# Patient Record
Sex: Male | Born: 1935 | Race: Black or African American | Hispanic: No | Smoking: Never smoker
Health system: Southern US, Community
[De-identification: ages and names within clinical notes are randomized; demographics above are authoritative.]

## PROBLEM LIST (undated history)

## (undated) DIAGNOSIS — I639 Cerebral infarction, unspecified: Secondary | ICD-10-CM

## (undated) DIAGNOSIS — N200 Calculus of kidney: Secondary | ICD-10-CM

## (undated) DIAGNOSIS — M8580 Other specified disorders of bone density and structure, unspecified site: Secondary | ICD-10-CM

## (undated) DIAGNOSIS — I340 Nonrheumatic mitral (valve) insufficiency: Secondary | ICD-10-CM

## (undated) DIAGNOSIS — I519 Heart disease, unspecified: Secondary | ICD-10-CM

## (undated) DIAGNOSIS — H9190 Unspecified hearing loss, unspecified ear: Secondary | ICD-10-CM

## (undated) DIAGNOSIS — G4733 Obstructive sleep apnea (adult) (pediatric): Secondary | ICD-10-CM

## (undated) DIAGNOSIS — M4850XA Collapsed vertebra, not elsewhere classified, site unspecified, initial encounter for fracture: Secondary | ICD-10-CM

## (undated) DIAGNOSIS — K4090 Unilateral inguinal hernia, without obstruction or gangrene, not specified as recurrent: Secondary | ICD-10-CM

## (undated) DIAGNOSIS — N183 Chronic kidney disease, stage 3 unspecified: Secondary | ICD-10-CM

## (undated) DIAGNOSIS — I10 Essential (primary) hypertension: Secondary | ICD-10-CM

## (undated) DIAGNOSIS — E785 Hyperlipidemia, unspecified: Secondary | ICD-10-CM

## (undated) DIAGNOSIS — F329 Major depressive disorder, single episode, unspecified: Secondary | ICD-10-CM

## (undated) DIAGNOSIS — M109 Gout, unspecified: Secondary | ICD-10-CM

## (undated) DIAGNOSIS — I4821 Permanent atrial fibrillation: Secondary | ICD-10-CM

## (undated) DIAGNOSIS — I779 Disorder of arteries and arterioles, unspecified: Secondary | ICD-10-CM

## (undated) DIAGNOSIS — E039 Hypothyroidism, unspecified: Secondary | ICD-10-CM

## (undated) DIAGNOSIS — G40909 Epilepsy, unspecified, not intractable, without status epilepticus: Secondary | ICD-10-CM

## (undated) DIAGNOSIS — I739 Peripheral vascular disease, unspecified: Secondary | ICD-10-CM

## (undated) DIAGNOSIS — I442 Atrioventricular block, complete: Secondary | ICD-10-CM

## (undated) DIAGNOSIS — H409 Unspecified glaucoma: Secondary | ICD-10-CM

## (undated) DIAGNOSIS — K649 Unspecified hemorrhoids: Secondary | ICD-10-CM

## (undated) DIAGNOSIS — Z8719 Personal history of other diseases of the digestive system: Secondary | ICD-10-CM

## (undated) HISTORY — DX: Unspecified glaucoma: H40.9

## (undated) HISTORY — DX: Unspecified hemorrhoids: K64.9

## (undated) HISTORY — DX: Other specified disorders of bone density and structure, unspecified site: M85.80

## (undated) HISTORY — PX: INGUINAL HERNIA REPAIR: SUR1180

## (undated) HISTORY — DX: Obstructive sleep apnea (adult) (pediatric): G47.33

## (undated) HISTORY — DX: Calculus of kidney: N20.0

## (undated) HISTORY — DX: Collapsed vertebra, not elsewhere classified, site unspecified, initial encounter for fracture: M48.50XA

## (undated) HISTORY — DX: Unspecified hearing loss, unspecified ear: H91.90

## (undated) HISTORY — DX: Permanent atrial fibrillation: I48.21

## (undated) HISTORY — PX: HEMORRHOID SURGERY: SHX153

## (undated) HISTORY — DX: Cerebral infarction, unspecified: I63.9

## (undated) HISTORY — DX: Chronic kidney disease, stage 3 unspecified: N18.30

## (undated) HISTORY — DX: Heart disease, unspecified: I51.9

## (undated) HISTORY — DX: Hypothyroidism, unspecified: E03.9

## (undated) HISTORY — DX: Hyperlipidemia, unspecified: E78.5

## (undated) HISTORY — DX: Epilepsy, unspecified, not intractable, without status epilepticus: G40.909

## (undated) HISTORY — DX: Major depressive disorder, single episode, unspecified: F32.9

## (undated) HISTORY — DX: Chronic kidney disease, stage 3 (moderate): N18.3

## (undated) HISTORY — DX: Nonrheumatic mitral (valve) insufficiency: I34.0

## (undated) HISTORY — DX: Unilateral inguinal hernia, without obstruction or gangrene, not specified as recurrent: K40.90

## (undated) HISTORY — DX: Essential (primary) hypertension: I10

## (undated) HISTORY — DX: Gout, unspecified: M10.9

## (undated) HISTORY — DX: Personal history of other diseases of the digestive system: Z87.19

## (undated) HISTORY — DX: Atrioventricular block, complete: I44.2

---

## 1997-08-16 ENCOUNTER — Ambulatory Visit (HOSPITAL_COMMUNITY): Admission: RE | Admit: 1997-08-16 | Discharge: 1997-08-16 | Payer: Self-pay | Admitting: *Deleted

## 1997-08-17 ENCOUNTER — Inpatient Hospital Stay (HOSPITAL_COMMUNITY): Admission: EM | Admit: 1997-08-17 | Discharge: 1997-08-18 | Payer: Self-pay | Admitting: Emergency Medicine

## 1997-10-21 ENCOUNTER — Emergency Department (HOSPITAL_COMMUNITY): Admission: EM | Admit: 1997-10-21 | Discharge: 1997-10-21 | Payer: Self-pay | Admitting: Emergency Medicine

## 1997-10-24 ENCOUNTER — Inpatient Hospital Stay (HOSPITAL_COMMUNITY): Admission: RE | Admit: 1997-10-24 | Discharge: 1997-10-30 | Payer: Self-pay | Admitting: *Deleted

## 1998-09-11 ENCOUNTER — Emergency Department (HOSPITAL_COMMUNITY): Admission: EM | Admit: 1998-09-11 | Discharge: 1998-09-11 | Payer: Self-pay | Admitting: Emergency Medicine

## 1998-12-15 ENCOUNTER — Encounter (INDEPENDENT_AMBULATORY_CARE_PROVIDER_SITE_OTHER): Payer: Self-pay | Admitting: *Deleted

## 1998-12-15 ENCOUNTER — Inpatient Hospital Stay (HOSPITAL_COMMUNITY): Admission: EM | Admit: 1998-12-15 | Discharge: 1998-12-18 | Payer: Self-pay | Admitting: Emergency Medicine

## 1998-12-17 ENCOUNTER — Encounter: Payer: Self-pay | Admitting: Internal Medicine

## 2000-02-04 HISTORY — PX: CARDIAC CATHETERIZATION: SHX172

## 2000-04-07 ENCOUNTER — Ambulatory Visit (HOSPITAL_COMMUNITY): Admission: RE | Admit: 2000-04-07 | Discharge: 2000-04-07 | Payer: Self-pay | Admitting: *Deleted

## 2000-04-28 ENCOUNTER — Ambulatory Visit (HOSPITAL_COMMUNITY): Admission: RE | Admit: 2000-04-28 | Discharge: 2000-04-28 | Payer: Self-pay | Admitting: Interventional Cardiology

## 2004-02-19 ENCOUNTER — Emergency Department (HOSPITAL_COMMUNITY): Admission: EM | Admit: 2004-02-19 | Discharge: 2004-02-19 | Payer: Self-pay | Admitting: Emergency Medicine

## 2004-07-25 ENCOUNTER — Emergency Department (HOSPITAL_COMMUNITY): Admission: EM | Admit: 2004-07-25 | Discharge: 2004-07-25 | Payer: Self-pay | Admitting: Emergency Medicine

## 2005-06-04 ENCOUNTER — Encounter: Payer: Self-pay | Admitting: *Deleted

## 2006-06-26 ENCOUNTER — Encounter: Admission: RE | Admit: 2006-06-26 | Discharge: 2006-06-26 | Payer: Self-pay | Admitting: Internal Medicine

## 2006-07-24 ENCOUNTER — Encounter: Admission: RE | Admit: 2006-07-24 | Discharge: 2006-07-24 | Payer: Self-pay | Admitting: Cardiology

## 2006-07-28 ENCOUNTER — Ambulatory Visit (HOSPITAL_COMMUNITY): Admission: RE | Admit: 2006-07-28 | Discharge: 2006-07-29 | Payer: Self-pay | Admitting: Cardiology

## 2006-07-28 HISTORY — PX: PACEMAKER INSERTION: SHX728

## 2006-08-15 ENCOUNTER — Inpatient Hospital Stay (HOSPITAL_COMMUNITY): Admission: EM | Admit: 2006-08-15 | Discharge: 2006-08-16 | Payer: Self-pay | Admitting: Emergency Medicine

## 2006-09-07 ENCOUNTER — Encounter: Admission: RE | Admit: 2006-09-07 | Discharge: 2006-09-07 | Payer: Self-pay | Admitting: Sports Medicine

## 2006-09-09 ENCOUNTER — Encounter: Admission: RE | Admit: 2006-09-09 | Discharge: 2006-09-09 | Payer: Self-pay | Admitting: Sports Medicine

## 2007-08-12 ENCOUNTER — Ambulatory Visit: Payer: Self-pay | Admitting: Oncology

## 2007-09-09 LAB — CBC WITH DIFFERENTIAL/PLATELET
HCT: 40.8 % (ref 38.7–49.9)
MCH: 29.5 pg (ref 28.0–33.4)
MCV: 87 fL (ref 81.6–98.0)
MONO#: 0.3 10*3/uL (ref 0.1–0.9)
MONO%: 7.2 % (ref 0.0–13.0)
Platelets: 169 10*3/uL (ref 145–400)
RBC: 4.69 10*6/uL (ref 4.20–5.71)
WBC: 4.2 10*3/uL (ref 4.0–10.0)
lymph#: 1.3 10*3/uL (ref 0.9–3.3)

## 2007-09-09 LAB — MORPHOLOGY: PLT EST: ADEQUATE

## 2007-09-09 LAB — CHCC SMEAR

## 2007-09-13 LAB — COMPREHENSIVE METABOLIC PANEL
CO2: 22 mEq/L (ref 19–32)
Calcium: 9.3 mg/dL (ref 8.4–10.5)
Chloride: 103 mEq/L (ref 96–112)
Creatinine, Ser: 1.5 mg/dL (ref 0.40–1.50)
Sodium: 135 mEq/L (ref 135–145)
Total Protein: 7.7 g/dL (ref 6.0–8.3)

## 2007-09-13 LAB — ANA: Anti Nuclear Antibody(ANA): NEGATIVE

## 2007-09-13 LAB — IMMUNOFIXATION ELECTROPHORESIS
IgA: 373 mg/dL (ref 68–378)
IgM, Serum: 34 mg/dL — ABNORMAL LOW (ref 60–263)

## 2007-09-13 LAB — VITAMIN B12: Vitamin B-12: 857 pg/mL (ref 211–911)

## 2007-09-13 LAB — LACTATE DEHYDROGENASE: LDH: 181 U/L (ref 94–250)

## 2008-04-11 ENCOUNTER — Encounter: Admission: RE | Admit: 2008-04-11 | Discharge: 2008-04-11 | Payer: Self-pay | Admitting: Psychiatry

## 2008-11-18 ENCOUNTER — Emergency Department (HOSPITAL_COMMUNITY): Admission: EM | Admit: 2008-11-18 | Discharge: 2008-11-18 | Payer: Self-pay | Admitting: Emergency Medicine

## 2009-04-21 ENCOUNTER — Emergency Department (HOSPITAL_COMMUNITY): Admission: EM | Admit: 2009-04-21 | Discharge: 2009-04-22 | Payer: Self-pay | Admitting: Emergency Medicine

## 2009-04-21 ENCOUNTER — Emergency Department (HOSPITAL_COMMUNITY): Admission: EM | Admit: 2009-04-21 | Discharge: 2009-04-21 | Payer: Self-pay | Admitting: Family Medicine

## 2010-04-26 LAB — DIFFERENTIAL
Eosinophils Absolute: 0.3 10*3/uL (ref 0.0–0.7)
Monocytes Absolute: 0.3 10*3/uL (ref 0.1–1.0)
Monocytes Relative: 8 % (ref 3–12)
Neutro Abs: 1.6 10*3/uL — ABNORMAL LOW (ref 1.7–7.7)
Neutrophils Relative %: 39 % — ABNORMAL LOW (ref 43–77)

## 2010-04-26 LAB — URINALYSIS, ROUTINE W REFLEX MICROSCOPIC
Bilirubin Urine: NEGATIVE
Glucose, UA: NEGATIVE mg/dL
Nitrite: NEGATIVE
Protein, ur: NEGATIVE mg/dL
Specific Gravity, Urine: 1.011 (ref 1.005–1.030)
Urobilinogen, UA: 1 mg/dL (ref 0.0–1.0)
pH: 6.5 (ref 5.0–8.0)

## 2010-04-26 LAB — POCT I-STAT, CHEM 8
BUN: 18 mg/dL (ref 6–23)
Creatinine, Ser: 1.8 mg/dL — ABNORMAL HIGH (ref 0.4–1.5)
Hemoglobin: 15 g/dL (ref 13.0–17.0)
Sodium: 138 mEq/L (ref 135–145)
TCO2: 25 mmol/L (ref 0–100)

## 2010-04-26 LAB — CBC
Hemoglobin: 13.6 g/dL (ref 13.0–17.0)
MCV: 88.8 fL (ref 78.0–100.0)
Platelets: 168 10*3/uL (ref 150–400)

## 2010-05-09 LAB — CBC
HCT: 42.1 % (ref 39.0–52.0)
MCHC: 33.5 g/dL (ref 30.0–36.0)
MCV: 88.6 fL (ref 78.0–100.0)
RBC: 4.76 MIL/uL (ref 4.22–5.81)

## 2010-05-09 LAB — DIFFERENTIAL
Basophils Absolute: 0 10*3/uL (ref 0.0–0.1)
Basophils Relative: 0 % (ref 0–1)
Eosinophils Absolute: 0 10*3/uL (ref 0.0–0.7)
Eosinophils Relative: 0 % (ref 0–5)
Lymphocytes Relative: 17 % (ref 12–46)
Monocytes Absolute: 0.2 10*3/uL (ref 0.1–1.0)
Monocytes Relative: 5 % (ref 3–12)
Neutro Abs: 4 10*3/uL (ref 1.7–7.7)

## 2010-05-09 LAB — COMPREHENSIVE METABOLIC PANEL
AST: 36 U/L (ref 0–37)
Alkaline Phosphatase: 55 U/L (ref 39–117)
Calcium: 9.4 mg/dL (ref 8.4–10.5)
Chloride: 100 mEq/L (ref 96–112)
Creatinine, Ser: 1.4 mg/dL (ref 0.4–1.5)
GFR calc Af Amer: 59 mL/min — ABNORMAL LOW (ref >60)
GFR calc non Af Amer: 49 mL/min — ABNORMAL LOW (ref >60)
Total Protein: 7.9 g/dL (ref 6.0–8.3)

## 2010-05-09 LAB — LACTIC ACID, PLASMA: Lactic Acid, Venous: 2.2 mmol/L (ref 0.5–2.2)

## 2010-05-09 LAB — LIPASE, BLOOD: Lipase: 30 U/L (ref 11–59)

## 2010-06-18 NOTE — Discharge Summary (Signed)
NAMEHORICE, CARRERO NO.:  [id number]   MEDICAL RECORD NO.:  [id number]          PATIENT TYPE:  INP   LOCATION:  4729                         FACILITY:  [facility]   PHYSICIAN:  Georgann Housekeeper, MD      DATE OF BIRTH:  [birth year]   DATE OF ADMISSION:  08/14/2006  DATE OF DISCHARGE:  08/16/2006                               DISCHARGE SUMMARY   DISCHARGE DIAGNOSES:  1. Seizure with history of seizure in the past.  2. Back pain over compression fracture T11.  3. Hypertension.  4. History of AFib with brady syndrome, had pacemaker implanted.  5. History of gout.   MEDICATIONS ON DISCHARGE:  1. Keppra 250 mg b.i.d.  2. Actonel 35 mg once a week.  3. Eye drops for glaucoma.  4. Aspirin 81 mg daily.  5. Hydrochlorothiazide 25 mg daily.  6. Diovan 320 mg daily.  7. Robaxin 500 mg q.8 p.r.n.  8. Vicodin 5/500 one to two q4-6 p.r.n. for pain.  9. Colchicine 0.6 b.i.d. p.r.n. for gout.   ACTIVITIES:  No driving   Follow up in one week.   RADIOLOGY:  CT of the head negative.   X-ray of the thoracic spine showed old fracture, no acute changes.   LABS:  White count of 7.3, hemoglobin of 12.6, creatinine 1.2, potassium  3.4.   HOSPITAL COURSE:  71. A 77 year old old male whose history of seizure disorder was      followed by the neurologist at [facility]; they were slowly tapering      down his Keppra.  He came off the Keppra last week after he had a      pacemaker put in which was thought to be causing the issues, but he      had two seizures, one at home, one at the ER going off the Keppra.      Head CT was negative.  Patient was started with Keppra IV 500 mg in      the ER and then switched back to twice a day and then 250 b.i.d.;      he was on 250 b.i.d. for baseline, was doing well.  2. As far as the history of lower back pain, he was seen in the office      a few days back, had an old T11 compression fracture, no acute      injuries.  He was doing Vicodin and  Robaxin.  Held off the CT scan      of the spine at this point and see how he does.  3. As far as his hypertension, it remains stable on Diovan and HCTZ.      Potassium was mildly low at 3.4.      Encouraging him to do the dietary changes.  4. As far as his history of recent gout episode, resolving.  Continue      colchicine p.r.n.   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  I will see him back in a week.  Georgann Housekeeper, MD  Electronically Signed     KH/MEDQ  D:  08/16/2006  T:  08/16/2006  Job:  [id number]

## 2010-06-18 NOTE — H&P (Signed)
Oscar Santiago, Oscar Santiago               ACCOUNT NO.:  [id number]   MEDICAL RECORD NO.:  [id number]          PATIENT TYPE:  INP   LOCATION:  1829                         FACILITY:  [facility]   PHYSICIAN:  Melissa L. Ladona Ridgel, MD  DATE OF BIRTH:  09-21-1925   DATE OF ADMISSION:  08/14/2006  DATE OF DISCHARGE:                              HISTORY & PHYSICAL   CHIEF COMPLAINT:  Recurrent seizure activity.   PRIMARY CARE PHYSICIAN:  Georgann Housekeeper, MD   HISTORY OF PRESENT ILLNESS:  The patient is a 77 year old, African-  American male with history of 2-1/2 years of seizure-like activity that  typically were characterized by periods of slurred speech, confusion,  tonic-colonic activity.  The patient was seen locally at [facility] and was worked up for this condition, but they  were unable to diagnose this disorder and therefore, he was referred to  [facility] for further care.  The patient saw two physicians at  [facility].  His current neurologist there is Dr. Monia Sabal.  I was told  this physician resided at [facility], however, the family did  mention Martin.  The patient was seen and evaluated by Dr. Doreatha Martin and it  was determined that he did not have adult-onset epilepsy which had been  his previous diagnosis.  She therefore recommended further workup as his  condition and his cardiologist discovered that he had a tachycardia-  bradycardia syndrome.  He underwent pacemaker placement on June 24, and  since he had not had any further seizure activity, it was determined  that his Keppra would be discontinued.  The Keppra was weaned from a  dose of 250 b.i.d. and his last dose was this last Thursday.  Please  note that the patient has had adverse behavioral reactions to elevated  Keppra levels and therefore 250 b.i.d. has been the amount selected for  his continued care.  Today, the patient was seen by his granddaughter to  be having a tonic-clonic seizure at home and  was brought to the  emergency room for further care and a witnessed seizure occurred in the  emergency room.  He was loaded with Keppra and the emergency room  physician spoke to the neurologist on-call for [facility] and it  was recommended that he would be resumed on Keppra 250 b.i.d.   REVIEW OF SYSTEMS:  Difficult to obtain as the patient is postictal and  at this time, he complains of no pain and is very groggy.   PAST MEDICAL HISTORY:  1. Tachycardia-bradycardia syndrome.  2. Macular degeneration.  3. Seizure disorder of unknown origin.  4. Gout.  5. Recent onset of back pain with possible back injury, although no      family member saw him fall.   PAST SURGICAL HISTORY:  1. Pacemaker.  2. Prostate surgery.   SOCIAL HISTORY:  He is a Education officer, environmental of a church.  He smoked a pipe years  ago, but no longer does that and he does not drink.  Health care power  of attorney is his granddaughter.  We talked about his code status.  He  has stated that he does not wish to be on a ventilator, but would wish  CPR to be initiated and this should be stopped if he is not responding  favorably.  Please note that in the past, the patient has used excessive  BC powders, but that behavior has stopped.   FAMILY HISTORY:  Mother had an enlarged heart.  Dad had an unknown  illness, according to his brother.   MEDICATIONS:  1. Colchicine 0.6 mg p.o. daily.  2. Hydrocodone p.r.n.  3. Methocarbamol 500 mg, frequency was not provided to me, but this is      a new drug in response to his new back pain.  4. Diovan 300 mg p.o. nightly.  5. Hydrochlorothiazide 25 mg q.a.m.  6. Actonel each week.  7. Aspirin 81 mg daily.  Please note that the patient has refused to      take Coumadin despite his atrial fibrillation.  8. Cosopt eye drops bilaterally one drop.  He had been on Alphagan,      but his granddaughter told me that the eye doctor said there was a      contraindication, so therefore this was  discontinued in favor of      Xalatan one drop to both eyes.  9. Keppra was just finished on Thursday and resumed in the emergency      room.   PHYSICAL EXAMINATION:  VITAL SIGNS:  Temperature 97, blood pressure  155/73, pulse 60, respirations 16, saturations 100%.  GENERAL:  The patient is somnolent, but will awaken with strong vocal  and tactile stimuli.  HEENT:  Normocephalic, atraumatic.  Pupils equal round and reactive to  light, extraocular movements intact.  Mucous membranes are moist.  NECK:  Supple.  There is no JVD, no lymph nodes and no carotid bruits.  CHEST:  Decreased with no rhonchi, rales or wheezes.  CARDIAC:  Irregular with positive S1, S2.  No S3, S4.  I did not  appreciate a murmur, rub or gallop.  EXTREMITIES:  Tenderness in the left first digit of the left foot,  otherwise no other areas of gout are noted.  BACK:  An area that is slightly elevated and raised around the mid to  lower T-spine and is tender to palpation.  NEUROLOGIC:  He is intermittently awake.  Power seems to be symmetrical  and intact.  Plantars are downgoing.   LABORATORY DATA AND X-RAY FINDINGS:  Sodium 138, potassium 3.7, chloride  103, CO2 24, BUN 23, creatinine 1.4 with a glucose of 108.  White count  7.8, hemoglobin 12.6, hematocrit 37.9 with platelets of 212.  Cardiac  markers are negative x1.   ASSESSMENT:  This is a 77 year old, African-American male with extensive  past medical history for seizure disorder of unknown cause.  The patient  recently was diagnosed with tachycardia-bradycardia syndrome and a  pacemaker was placed.  It was thought that the pacemaker may be solving  the underlying problem of tachycardia-bradycardia which may have been  contributing to his cognitive changes and what appeared to be seizure  activities.  He had been doing well on Keppra for over a year and it was  decided that this new diagnosis had come about that they would try to  wean the Keppra.  His last  dose of Keppra was Thursday prior to this  admission.  He was noted today to have a seizure, tonic-clonic in  origin, without loss of bowel or bladder function and then a  witnessed  seizure in the emergency room.  The patient was loaded with Keppra and  treated for pain with Percocet.   PLAN:  1. Neurologic.  Seizure-like activity secondary to the discontinuation      of Keppra, but cannot rule out trauma.  There was no witnessed      fall, but he does have new unexplained back pain.  The patient's      neurologist was contacted by the emergency room physician and      recommendations to continue his Keppra at 250 mg q.12 h. after      loading him with 500 mg was made.  I will recommend a CT scan of      the head to rule out possible intracranial process causing      seizures.  I will place him on seizure precautions and continue his      Keppra as stated.  A neurology consult with [facility] can      be made if necessary by the primary team.  2. Cardiovascular with tachycardia-bradycardia syndrome.  Telemetry      monitoring will be continued.  I will continue his aspirin,      hydrochlorothiazide and Diovan.  3. Pulmonary.  No complaints at this time.  If the patient spikes a      temperature, I will recommend checking a chest x-ray to rule out      aspiration since he has had two seizures in the last 24 hours.  4. Gastrointestinal.  He is n.p.o. right now.  Until he is fully awake      and alert, I would keep him n.p.o.  His granddaughter described      some constipation, so I will add Colace, Senna and p.r.n. MiraLax.  5. Genitourinary.  The patient has not voided for most of the night.      Will therefore leave a p.r.n. Foley catheter order.  If he is      unable to void in the next hour or so, we will go ahead and place      the Foley.  6. Back pain with unclear trauma.  The patient does not recall having      trauma.  He states he was sitting in a chair when he became       startled by the doorbell ringing and noticed that his back hurt.  I      am going to go ahead and check a thoracic x-ray.  He may need to      have a CT scan of his back if there appears to be a fracture.  The      patient cannot have MRI of the spine because of his pacemaker.  I      will add Vicodin one p.o. q.4 h. p.r.n. pain.  7. Gout.  Will continue his colchicine.  I did not check a uric acid,      but this could be added to his morning labs.  8. Limited code.  The patient has expressed to his family a wish not      to have CPR.  He does not have a wish to be placed on the      ventilator.  Therefore, he stated if his CPR is ineffective, he      would like them to stop it and let him go.  9. Glaucoma.  Dr. Donette Larry can check his medication list, but his  granddaughter believes that his Alphagan was stopped in lieu of      using the Cosopt and Xalatan together.   This will initiate the patient's hospital stay.  If you have any  questions, please do not hesitate to contact me.  For tonight, I will be  on [phone], thereafter, the patient's primary physician team at [facility]  should be contacted.      Melissa L. Ladona Ridgel, MD  Electronically Signed     MLT/MEDQ  D:  08/15/2006  T:  08/15/2006  Job:  [id number]

## 2010-06-18 NOTE — Op Note (Signed)
NAMESINJIN, AMERO NO.:  [id number]   MEDICAL RECORD NO.:  [id number]          PATIENT TYPE:  OIB   LOCATION:  2807                         FACILITY:  [facility]   PHYSICIAN:  Francisca December, M.D.  DATE OF BIRTH:  [birth year]   DATE OF PROCEDURE:  07/28/2006  DATE OF DISCHARGE:                               OPERATIVE REPORT   INDICATION FOR SURGERY:  Permanent pacemaker insertion.   PROCEDURES PERFORMED:  1. Insertion of single-chamber ventricular permanent transvenous      pacemaker.  2. Left subclavian venogram.   INDICATIONS:  [name] is a 77 year old man with a long history  of chronic atrial fibrillation.  He has developed a very slow  ventricular response to his chronic atrial fibrillation, secondary to  high-degree AV block.  There may in fact be periods of complete heart  block.  He was brought to the catheterization laboratory at this time  for insertion of a single-chamber permanent transvenous pacemaker.   PROCEDURE NOTE:  The patient was brought to cardiac catheterization  laboratory in a fasting state.  The left prepectoral region was prepped  and draped in the usual sterile fashion.  Local anesthesia was obtained  with infiltration of 1% lidocaine with epinephrine throughout the left  prepectoral region.  A left subclavian venogram was performed with a  peripheral injection of 20 mL of Omnipaque.  A Digital cineangiogram in  the AP projection was obtained and road mapped to guide future left  subclavian puncture.  The venogram did demonstrate the vein to be widely  patent, and coursing in a normal fashion over the anterior surface of  the first rib and the middle third of the clavicle.  There was no  evidence for persistence of the left superior vena cava.   A 6-7 cm incision was then made in the deltopectoral groove, and this  was carried down by sharp dissection and electrocautery to the  prepectoral fascia.  There a plane was lifted  and the pocket formed  inferiorly and medially.  The pocket was then packed with 1% kanamycin-  soaked gauze.  A single left subclavian puncture was performed with an  18-gauge thin-walled needle, through which was passed a 0.038-inch tight  J guidewire.  Over this guidewire a 7-French tearaway sheath and dilator  were advanced.  The dilator and wire were removed and a ventricular lead  was advanced to the level of the right atrium.  The sheath was torn  away.  Using standard technique and fluoroscopic landmarks, the lead was  manipulated onto the upper-to-mid portion of the interventricular  septum.  There excellent pacing parameters were obtained, as will be  noted below.  This was an active fixation lead and the screw was  advanced as appropriate.  It was tested for diaphragmatic pacing at 10  volts and none was found.  The lead was then sutured into place using 3  separate 0 silk ligatures.  The kanamycin-soaked gauze was then removed  from the pocket, and the pocket was copiously irrigated using 1%  kanamycin solution.  The ventricular lead  was then attached to the  pacing generator and tightened into place with a set screw; it was  tested for security.  The lead was then wound beneath the pacing  generator and the generator was placed in the pocket.  An 0 silk  anchoring suture was applied.  The pocket was then inspected for  bleeding and none was found.  The pocket was then closed using 2-0  Vicryl in a running fashion for the subcutaneous layer.  The skin was  approximated using 4-0 Vicryl in a running subcuticular fashion.  Steri-  Strips and a sterile dressing were applied.  The patient was transported  to the recovery area, in a ventricular pace mode at 60 beats per minute.   EQUIPMENT DATA:  1. Pacing Generator:  Medtronic Adapta, Model Number [id number], Serial      Number [id number] H. 2.  2.  Ventricular Lead:  Medtronic model      number [id number], serial number [id number].    PACING DATA:  The pacing threshold was 0.6 volts at 0.5 milliseconds  pulse width.  The impedance was 632 ohms, resulting in a current at  capture threshold of 1.0 mA.  The R-wave detected amplitude was 17.3 mV.      Francisca December, M.D.  Electronically Signed     JHE/MEDQ  D:  07/28/2006  T:  07/28/2006  Job:  [id number]   cc:   Georgann Housekeeper, MD

## 2010-06-18 NOTE — Discharge Summary (Signed)
NAMEISHAQ, MAFFEI NO.:  [id number]   MEDICAL RECORD NO.:  [id number]          PATIENT TYPE:  OIB   LOCATION:  3736                         FACILITY:  [facility]   PHYSICIAN:  Francisca December, M.D.  DATE OF BIRTH:  1925/10/04   DATE OF ADMISSION:  07/28/2006  DATE OF DISCHARGE:  07/29/2006                               DISCHARGE SUMMARY   DISCHARGE DIAGNOSES:  1. Tachybrady syndrome status post permanent pacemaker, single      chamber, Medtronic type July 28, 2006.  2. Chronic  atrial fibrillation.  3. Refuses Coumadin.  4. Hypertension.  5. Seizure disorder.  6. Osteopenia with compression fracture, degenerative joint disease.  7. Glaucoma.  8. Mild dyslipidemia.   Mr. Vantol is a 77 year old male patient with the above chronic medical  conditions.  He has had chronic episodes of lightheadedness, some  confusion and memory loss with possible seizure-like activity.  He was  eventually placed on Keppra for this reason.  He does have tachybrady  syndrome and; therefore, he was brought to the cardiac catheterization  lab on July 28, 2006.   He received a single-chamber pacemaker implantation, Medtronic type by  Dr. Corliss Marcus on July 28, 2006.  The following day, he was doing  well, chest x-ray showed no pneumothorax and he was ready for discharge  to home.  He was discharged to home in stable, but improved condition.  There was some redness laterally at the incision site although there was  no active infection noted or no drainage.   He is discharged to home on the same medications that he came in on  which include:  1. Hydrochlorothiazide 25 mg one tablet daily.  2. Diovan 320 mg one tablet daily.  3. Actonel 35 mg weekly.  4. Flonase nasal spray daily.  5. Keppra 250 mg twice a day.  6. Baby aspirin daily.  7. Pepcid daily as prior to admission.  8. He may resume his eye drops, Cosopt and Alphagan as prior to      admission.  9. I did give him a  prescription for Vicodin one to two tablets every      six hours as needed for pain, #20 with one refill.   He is discharged to home in stable and improved condition.      Guy Franco, P.A.      Francisca December, M.D.  Electronically Signed    LB/MEDQ  D:  07/29/2006  T:  07/29/2006  Job:  [id number]   cc:   Lyn Records, M.D.

## 2010-06-21 NOTE — Consult Note (Signed)
[facility]  Patient:    Oscar Santiago                       MRN: [id number] Proc. Date: 12/15/98 Adm. Date:  [id number] Attending:  Dutch Gray CC:         Demetria Pore. Coral Spikes, M.D.             Norva Pavlov, M.D.                          Consultation Report  HISTORY OF PRESENT ILLNESS:  [name] is a 77 year old male, patient of Norva Pavlov, M.D., who is being covered by Demetria Pore. Coral Spikes, M.D.  He reports that on December 13, 1998, he had an episode of syncope with profuse sweating and was taken to [facility].  Cardiac evaluation was done and was normal and was supposedly blood count was also normal.  There was no investigation of GI bleeding at that time.  He returned home and has had multiple episodes of hematochezia since then.  He describes the stool as variously red, filled with clots, and dark stool.  He denies any abdominal pain, nausea, vomiting, dysphagia, epigastric discomfort or weight loss.  He does say he had some lower abdominal cramping with the original episodes of bleeding but this is not present currently. He is on Coumadin for chronic atrial fibrillation and thinks that on Tuesday he may have taken a double dose by mistake.  Pro-Time in the emergency room is 21.7 for an INR of 2.7 and hemoglobin is 10.2, where his baseline checked recently was over 14. He had a flexible sigmoidoscopy in September of this year that was reportedly negative to 60 cm; he also questionably has a prior history of peptic ulcer disease but the evaluation for this is unclear.  He does not drink alcohol or use aspirin. He is status post a hemorrhoidectomy.  PAST MEDICAL HISTORY:  Pertinent for prostatic hypertrophy.  He is status post  TURP and for environmental allergies, for hypertension and for chronic atrial fibrillation.  In addition, he tells me that he possibly had an episode of endocarditis in the past.  CURRENT  MEDICATIONS: 1. Coumadin. 2. Hydrochlorothiazide 12.5 mg per day. 3. Claritin D. 4. Flonase. 5. Alphagan eye drops.  ALLERGIES:  None reported.  FAMILY HISTORY:  Negative for ulcers, gallstones, inflammatory bowel disease or  gastrointestinal neoplasia.  SOCIAL HISTORY:  He is a widower with six living children, nonsmoker, nondrinker. He is a Education officer, environmental.  REVIEW OF SYSTEMS:  GENERAL:  No weight loss or night sweats.  ENDOCRINE:  No history of diabetes or thyroid problems.  SKIN:  No rash or pruritus.  EYES: No icterus or change in vision.  ENT:  No aphthous ulcers or chronic sore throat.  RESPIRATORY:  No shortness of breath, cough or wheezes.  CARDIAC:  No chest pain. Positive history of chronic atrial fibrillation.  GI:  As above.  GU:  Positive for prostatic hypertrophy, status post TURP.  Remainder of review of systems is negative.  PHYSICAL EXAMINATION:  GENERAL:  He is a well-developed, well-nourished, moderately overweight adult male in no acute distress.  VITAL SIGNS:  Blood pressure 154/71 and an irregular pulse of 65.  SKIN:  Normal.  HEENT:  Eyes are anicteric.  Oropharynx is unremarkable.  NECK:  Supple without thyromegaly.  LYMPHATICS:  There is no cervical or inguinal adenopathy.  CHEST:  Clear.  Heart sounds are irregularly irregular.  ABDOMEN:  Obese and soft without mass, tenderness or organomegaly.  There is no  rebound or hernia.  Bowel sounds are normal.  RECTAL:  Exam is not performed, having previously been done.  EXTREMITIES:  Without edema.  Dorsalis pedis pulses are 2+ bilaterally.  LABORATORY DATA:  Laboratory tests reveal prothrombin time of 21.7, PTT is normal, electrolytes are normal; BUN is 20, SGOT is 39 but other liver function tests are normal.  Hemoglobin is 10.2 with an MCV of 81.4.  Platelet count is normal at 208,000.  IMPRESSION:  [age]-year-old male on Coumadin for chronic atrial fibrillation with  gastrointestinal bleeding.  This is likely lower; however, in light of his  need for Coumadin and prior history of peptic ulcer disease and minimal nausea nd sense of regurgitation when the bleeding started, I think we had best check his  upper tract as well before resuming Coumadin.  PLAN:  Both upper endoscopy and colonoscopy are reviewed with the patient and his family in terms of preparation technique and risks of complications including bleeding and perforation.  They agree to proceed and the endoscopic studies are  scheduled for 12:30 tomorrow afternoon.  Phospho-Soda prep and clear liquid diet will be administered until breakfast.  Vitamin K is given to correct his Pro-Time. Serial hemoglobins will be checked and he will be transfused if it drops below . Pepcid is being given IV.  Please see the orders. DD:  12/15/98 TD:  12/16/98 Job: [id number]

## 2010-06-21 NOTE — Procedures (Signed)
[place]. [facility]  Patient:    Oscar Santiago                       MRN: [id number] Proc. Date: 12/16/98 Adm. Date:  [id number] Attending:  Dutch Gray CC:         Norva Pavlov, M.D.             Demetria Pore. Coral Spikes, M.D.                           Procedure Report  PROCEDURES: 1. Video upper endoscopy. 2. Video colonoscopy.  INDICATIONS:  This is a 77 year old male with maroon stool and syncope.  PREPARATION:  He is NPO since midnight, having taken Phospho-Soda prep and a clear liquid diet yesterday.  In addition, he has been transfused two units of blood.  PREPROCEDURE SEDATION:  He received 40 mg of Demerol and 5 mg of Versed intravenously.  In addition he was on 2 liters of O2 by nasal cannula.  PROCEDURE #1: Video upper endoscopy.  DESCRIPTION OF PROCEDURE:  The Olympus video upper endoscope was inserted via the mouth and advanced easily through the upper esophageal sphincter.  Intubation was then carried out well into the third duodenum.  On withdrawal the mucosa was carefully evaluated.  The duodenum and bulb appeared normal other than a possible old ulcer scar in the duodenal bulb.  There was no active ulceration.  The antrum and body of the stomach were unremarkable.  Retroflexed view of the fundus and GE junction demonstrated mild laxity of the Z-line.  However, the Z-line occurred at 43 cm and there was no clear sign of a hiatal hernia.  There was no  esophagitis.  No blood was seen throughout the upper GI tract.  At the conclusion of this procedure the patients position was reversed for procedure #2.  PROCEDURE #2: Video colonoscopy.  DESCRIPTION OF PROCEDURE:  He required no additional sedation.  The Olympus video colonoscope was inserted via the rectum and advanced quite easily all the way to the cecum.  Cecal landmarks were identified and photographed.  The ileocecal valve was not traversed.  On withdrawal the mucosa  was carefully evaluated.  Note was  made of minimal scattered diverticulosis throughout the colon.  There was no active bleeding or neoplastic lesion of the right colon.  Within the rectum there was  diminutive polyp removed with hot biopsy forceps.  Retroflexed view of the rectum demonstrated grade 1-2 internal hemorrhoids. There were no other lesions of note.  There was no inflammation or bleeding found.  The patient tolerated both procedures well.  Pulse, blood pressure and oximetry  testing were stable throughout.  He was observed in recovery for 45 minutes and  discharged back to his hospital room.  IMPRESSION: 1. Essentially normal upper endoscopy. Possible scarred duodenum from old ulcer    disease, not currently active. 2. Near normal colonoscopy with minimal scattered diverticulosis and grade 1-2    internal hemorrhoids.  Since there has been no clear site of bleeding found I am suspicious that he has a vascular abnormality that either is not obvious at this time or is located in the small bowel.  A less likely consideration would be a small bowel mass lesion.  RECOMMENDATIONS:  I think the patient should undergo a small bowel series to make certain there is no neoplastic process in the small bowel.  I think  if this is negative and he has no further bleeding, Coumadin should be started again cautiously.  If he has recurrent gastrointestinal bleeding, consideration should be given to enteroscopy of the small bowel looking for vascular lesions and/or a bleeding scan to localize the site. DD:  12/16/98 TD:  12/17/98 Job: [id number]

## 2010-06-21 NOTE — Cardiovascular Report (Signed)
[place]. [facility]  Patient:    Oscar Santiago, Oscar Santiago                      MRN: [id number] Proc. Date: 04/28/00 Adm. Date:  [id number] Disc. Date: [id number] Attending:  Meade Maw A CC:         Pearla Dubonnet, M.D.  Norva Pavlov, M.D.  Darius Bump, M.D.   Cardiac Catheterization  INDICATION:  Mitral regurgitation with documented 3 to 4+ mitral regurgitation by TEE.  PROCEDURE: 1. Left heart catheterization. 2. Right heart catheterization. 3. Coronary angiography. 4. Left ventriculography.  DESCRIPTION:  After informed consent, a 6-French sheath was placed into the right femoral artery and an 8-French sheath in the right femoral vein both via the modified Seldinger technique.  We then performed right heart catheterization using a 7-French Swan-Ganz catheter.  Oximetry samples were obtained from the main PA and also from the ascending aorta.   Left heart hemodynamic recordings were made with pigtail catheter.  Oximetry samples were also obtained with this catheter.  Left ventriculography was performed in the 30 degree RAO and also in the 40 degree LAO with 20 degrees of cranial angulation.  Each injection was performed using 40 cc of contrast at 12 cc per second.  Coronary angiography was performed with a #4 6-French left Judkins catheter and a 6-French A2 multipurpose catheter, respectively.  The patient tolerated the procedure without complications.  RESULTS: I.   Hemodynamic data:      a. Right atrial mean pressure 9 mmHg.      b. RV pressure 40/9 mmHg.      c. Pulmonary artery pressure 41/20 mmHg.      d. Pulmonary capillary wedge pressure 17 mmHg.      e. Aortic pressure 148/68 mmHg.      f. Left ventricular pressure 1.9/15 mmHg.      g. Cardiac output by Fick 5.5 liters per minute and by thermodilution         4.3 liters per minute. II.  Left ventriculography:  The left ventricle is normal to mildly dilated      in size.   The ejection fraction is felt to be greater than 60%.  There is      1+ (at most 2+) mitral regurgitation.  No regional wall motion      abnormality is noted III. Selective coronary angiography.      a. Left main coronary:  Normal.      b. Left anterior descending coronary: This is a large vessel that wraps         around the left ventricular apex.  Proximal luminal irregularities         arise from it.  Two small diagonal branches arise from it.  No         significant obstruction is noted in the LAD.      c. Ramus intermedius branch: A large ramus branch arises from the left         main.  There is ostial 50% narrowing in this ramus branch.  The ramus         branch bifurcates on the left lateral wall.      d. Circumflex artery: The circumflex is large.  It gives origin to four         obtuse marginal branches.  Irregularities are noted in the mid vessel.         No significant obstruction is  seen.      e. Right coronary: The right coronary artery is relatively small, giving         an inferior right ventricular branch and a small PDA.  No significant         obstruction is noted.  CONCLUSIONS: 1. Mild mitral regurgitation (1+). 2. Overall normal left ventricular function with ejection fraction greater    than 60%. 3. Mild pulmonary hypertension with pulmonary artery systolic pressure of    40 mmHg. 4. Minimal luminal irregularities noted in the right coronary left anterior    descending artery and circumflex but no significant obstructive disease is    seen.  RECOMMENDATIONS:  Medical therapy.DD:  04/28/00 TD:  04/28/00 Job: [id number]

## 2010-06-21 NOTE — Cardiovascular Report (Signed)
[place]. [facility]  Patient:    Oscar Santiago, Oscar Santiago                      MRN: [id number] Proc. Date: 04/07/00 Adm. Date:  [id number] Attending:  Meade Maw A CC:         Celso Sickle, M.D.   Cardiac Catheterization  PROCEDURE:  Transesophageal echocardiogram.  CARDIOLOGIST:  Meade Maw, M.D.  INDICATION FOR STUDY:  Evaluation of mitral regurgitation.  Quality of the study is good.  ANESTHESIA:  Versed 10 mg IV, fentanyl 100 mcg IV.  DISPOSITION:  The patient tolerated the procedure well.  FINDINGS:  The anterior and posterior leaflet of the mitral valve were thickened.  There was poor coaptation of the mitral valve leaflets resulting in moderately severe mitral regurgitation.  The regurgitant jet was noted at the level of the pulmonary veins.  There was biatrial enlargement.  There was moderate tricuspid regurgitation with the peak right ventricular systolic pressure recorded at 46 mmHg.  The subvalvular mitral valve apparatus was normal.  There was no significant annular calcification noted.  The aortic valve was trileaflet, opened well in systole, no restriction in leaflet mobility was noted.  Tricuspid valve is grossly normal.  Moderate tricuspid regurgitation as noted above.  Pulmonic valve is grossly normal.  The left ventricle is mildly dilated.  Overall preserved ejection fraction approximately 55%.  Right ventricle normal dimension and function.  There is no pericardial effusion.  Normal ascending aorta.  The arch and descending aorta not well visualized secondary to tortuous aorta.  Negative bubble study. Normal left atrial appendage.  OVERALL IMPRESSION:   Thickened mitral valve leaflets with moderate to moderately severe mitral regurgitation, normal subvalvular apparatus. Pulmonary artery systolic pressure recorded at 46 mmHg.  Left ventricle mildly dilated with preserved systolic function.  The findings will be discussed  with Dr. Katrinka Blazing. DD:  04/07/00 TD:  04/07/00 Job: [id number]

## 2010-06-21 NOTE — H&P (Signed)
[place]. [facility]  Patient:    Oscar Santiago, Oscar Santiago                     MRN: [id number] Adm. Date:  04/28/00 Attending:  Celso Sickle, M.D. Dictator:   Anselm Lis, N.P.                         History and Physical  DATE OF BIRTH:  Jan 17, 1926  PROBLEMS:  (As dictated by Dr. Darci Needle III): 1. Valvular cardiac disease, moderate to severe MR, moderate TR.    Question prior bacterial endocarditis in 1990:    a.  On April 07, 2000, transesophageal echocardiogram by Dr. Meade Maw,        revealing moderate to moderately-severe MR, moderate TR.  Increased        PA systolic pressure of 46 mmHg with mildly dilated LV, preserved        LV function.  Biatrial enlargement with normal RV.  In February 2002,        a 2-D echocardiogram revealing moderate MR, dilated LV, with an        EF of 55%, mild LVH, left atrial enlargement of 6 cm. 2. Chronic atrial fibrillation, controlled ventricular response on    aspirin only, secondary to prior GI bleed. 3. History of GI bleed in November 2000, exacerbated by Coumadin therapy.    EGD and colonoscopy okay at that time. 4. Hypertension. 5. BPH, status post TURP in September 1999. 6. Seasonal allergies, in December 2001, bronchitis/sinusitis. 7. History of diverticulosis. 8. Glaucoma. 9. Osteoarthritis.  PLAN:  (As dictated by Dr. Verdis Prime):  Right and left cardiac catheterization, question recommendation for surgical repair of mitral valve, pending results.  The risks, potential complications, benefits, and alternatives to the procedure were discussed with the patient.  Mr. Brinkley indicates his questions and concerns have been addressed and is agreeable to proceed.  PAST SURGICAL HISTORY: 1. In 07-10-97, a TURP. 2. In 1992/07/10, hemorrhoid surgery. 3. In 07/11/1990, right inguinal hernia repair.  ALLERGIES:  No known drug allergies.  Okay with sea food, shell fish, and iodinated products.  CURRENT  MEDICATIONS: 1. Hydrochlorothiazide 1/2 of a 25 mg tablet once q.d. 2. Aspirin 325 mg q.d. 3. Claritin 10 mg p.o. p.r.n. q.d. 4. Flonase one puff each naris q.d. 5. Xalatan eye drops, one drop OU q.h.s.  SOCIAL HISTORY/HABITS:  Tobacco, ETOH, drug use:  Negative.  Caffeine:  Not excessive.  The patient is retired, working in the [employer], currently works as a Arts administrator.  He was widowed in Jul 10, 1997.  His wife died of end-stage renal disease/diabetes mellitus.  Six children, one with diabetes.  FAMILY HISTORY:  Mother died at age 84.  Had cardiac problems.  Father died at age 78 of uncertain cause.  One brother who is okay.  Two sisters deceased, one of a stroke at age 54, one died after an Adrenalin shot for the treatment of asthma.  One brother is a diabetic.  REVIEW OF SYSTEMS:  As in the HPI/past medical history.  Otherwise denies problems with lightheadedness, dizziness, syncope, or near syncopal episodes. Denies exertional dyspnea.  Denies orthopnea or PND.  Uses two pillows, initially some seasonal allergies, now out of habit.  Negative GERD.  Denies arthritic-type of complaints.  Negative pedal edema.  Denies constipation, diarrhea, melena, or bright red blood PR.  Negative dysuria  or hematuria. Denies a history of cancer or thyroid disease.  PHYSICAL EXAMINATION:  (As performed by Dr. Verdis Prime):  VITAL SIGNS:  Blood pressure 155/73, heart rate 43 and regular, respirations 16, temperature 97.7 degrees.  Height 6 feet 1 inch, weight 236 pounds.  GENERAL:  He is a well-nourished gentleman appearing about his stated age.  He appears in good physical conditioning.  HEENT/NECK:  Brisk bilateral carotid upstroke without bruit.  No significant JVD, no thyromegaly.  CHEST:  Lung sounds clear with equal bilateral excursion.  Negative CPA tenderness.  CARDIAC:  An irregular rate and rhythm with normal S1, S2.  He has a grade 1/6 late systolic murmur, only appreciated  at the apex.  ABDOMEN:  Soft, nondistended.  Normoactive bowel sounds.  Negative abdominal aortic, renal, or femoral bruit.  Nontender to applied pressure.  No masses, no organomegaly appreciated.  NEUROLOGIC:  Cranial nerves II-XII grossly intact.  The neuro examination is nonfocal.  Alert and oriented x 3.  GENITOURINARY:  Deferred.  RECTAL:  Deferred.  LABORATORY DATA:  EKG from March 13, 2000, revealed A-fib with controlled ventricular response, HB _____________.  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  Lateral T-wave abnormalities.  On April 23, 2000, a chest x-ray revealed slight chronic bronchitis/COPD pattern.  No active disease.  From April 23, 2000, sodium 140, K 4, chloride 103, CO2 of 30, BUN 19, creatinine 1.3, glucose 94.  LFTs within normal range.  Hemoglobin 14.5, hematocrit 42.1, WBC 5.3, platelets 229.  Pro time 11.5, INR 0.96, PTT 36. DD:  04/28/00 TD:  04/28/00 Job: [id number]

## 2010-11-19 LAB — CBC
MCHC: 33.3
MCV: 87
Platelets: 229
RBC: 4.42
RDW: 14.7 — ABNORMAL HIGH
RDW: 15 — ABNORMAL HIGH
WBC: 7.3

## 2010-11-19 LAB — BASIC METABOLIC PANEL
BUN: 15
Chloride: 100
Creatinine, Ser: 1.27
Glucose, Bld: 98

## 2010-11-19 LAB — I-STAT 8, (EC8 V) (CONVERTED LAB)
Acid-base deficit: 2
Bicarbonate: 22.9
Hemoglobin: 13.9
Potassium: 3.7
Sodium: 138
TCO2: 24
pH, Ven: 7.372 — ABNORMAL HIGH

## 2010-11-19 LAB — POCT I-STAT CREATININE
Creatinine, Ser: 1.4
Operator id: 133351

## 2010-11-19 LAB — DIFFERENTIAL
Eosinophils Absolute: 0.2
Eosinophils Relative: 2
Lymphs Abs: 1.4
Monocytes Absolute: 0.6
Monocytes Relative: 8

## 2010-11-19 LAB — POCT CARDIAC MARKERS: Troponin i, poc: <0.05

## 2010-11-19 LAB — BILIRUBIN, FRACTIONATED(TOT/DIR/INDIR): Total Bilirubin: 1

## 2011-10-02 ENCOUNTER — Ambulatory Visit: Payer: Self-pay | Admitting: Physical Therapy

## 2012-12-24 ENCOUNTER — Ambulatory Visit (INDEPENDENT_AMBULATORY_CARE_PROVIDER_SITE_OTHER): Payer: Medicare Other | Admitting: Cardiology

## 2012-12-24 ENCOUNTER — Encounter: Payer: Self-pay | Admitting: Cardiology

## 2012-12-24 VITALS — BP 132/70 | HR 70 | Ht 71.0 in | Wt 184.0 lb

## 2012-12-24 DIAGNOSIS — M8580 Other specified disorders of bone density and structure, unspecified site: Secondary | ICD-10-CM | POA: Insufficient documentation

## 2012-12-24 DIAGNOSIS — E785 Hyperlipidemia, unspecified: Secondary | ICD-10-CM | POA: Insufficient documentation

## 2012-12-24 DIAGNOSIS — G4733 Obstructive sleep apnea (adult) (pediatric): Secondary | ICD-10-CM | POA: Insufficient documentation

## 2012-12-24 DIAGNOSIS — N200 Calculus of kidney: Secondary | ICD-10-CM | POA: Insufficient documentation

## 2012-12-24 DIAGNOSIS — I10 Essential (primary) hypertension: Secondary | ICD-10-CM

## 2012-12-24 DIAGNOSIS — I482 Chronic atrial fibrillation, unspecified: Secondary | ICD-10-CM | POA: Insufficient documentation

## 2012-12-24 NOTE — Patient Instructions (Signed)
Your physician recommends that you continue on your current medications as directed. Please refer to the Current Medication list given to you today.  We are going to contact [facility] and get a Refit for your mask and also get a CPAP Autotitration for 2 weeks from 4-20 cm H2O  Your physician wants you to follow-up in: 6 Months with Dr. Sherlyn Lick will receive a reminder letter in the mail two months in advance. If you don't receive a letter, please call our office to schedule the follow-up appointment.

## 2012-12-24 NOTE — Progress Notes (Signed)
[address] [place], [place]  [address] Phone: [phone] Fax:  [phone]  Date:  12/24/2012   ID:  Oscar Santiago, DOB [birth year], MRN [id number]  PCP:  No primary provider on file.  Sleep Medicine:  Armanda Magic, MD  History of Present Illness: Oscar Santiago is a 79 y.o. male with a history of OSA, HTN and chronic atrial fibrillation who presents today for followup of his CPAP.  He has been having problems with the pressure on his CPAP device.  He goes to sleep without any problems but then wakes up around 2 am with the air blowing very hard.  He has had to take it off because he cannot stand the pressure.  He uses the full face mask which does not leak and he tolerates it well.  The mask is brand new.  His last download from August to November showed an. average AHI of 3.9/hr on a pressure of 10cm H2o.  His compliance is 94% in using more than 4 hours nightly. It also shows an intermittent large mask leak.   Wt Readings from Last 3 Encounters:  12/24/12 184 lb (83.462 kg)     Past Medical History  Diagnosis Date  . Hypertension   . Chronic atrial fibrillation     refuses coumadin due to history of GI bleed  . Seizure disorder   . Glaucoma   . Hyperlipidemia   . Osteopenia   . Compression fracture of spine     T 10 and T11  . Kidney stone   . Gout   . OSA (obstructive sleep apnea)     on CPAP therapy  . Hypothyroid   . Inguinal hernia   . Hemorrhoid     Current Outpatient Prescriptions  Medication Sig Dispense Refill  . aspirin 81 MG tablet Take 81 mg by mouth.      . furosemide (LASIX) 10 MG/ML solution Take by mouth daily.      Marland Kitchen levETIRAcetam (KEPPRA) 750 MG tablet Take 750 mg by mouth 2 (two) times daily. 250 mg in am 500 in pm      . levothyroxine (SYNTHROID, LEVOTHROID) 25 MCG tablet Take 25 mcg by mouth daily before breakfast.      . valsartan (DIOVAN) 320 MG tablet Take 320 mg by mouth daily. Stopped for Nov and Dec only due to doughnut hole       . valsartan-hydrochlorothiazide (DIOVAN-HCT) 320-25 MG per tablet Take 1 tablet by mouth daily. For the month of Nov and Dec only       No current facility-administered medications for this visit.    Allergies:    Allergies  Allergen Reactions  . Lisinopril Cough    Social History:  The patient  reports that he has never smoked. He does not have any smokeless tobacco history on file. He reports that he does not drink alcohol or use illicit drugs.   Family History:  The patient's family history is not on file.   ROS:  Please see the history of present illness.      All other systems reviewed and negative.   PHYSICAL EXAM: VS:  BP 132/70  Pulse 70  Ht 5\' 11"  (1.803 m)  Wt 184 lb (83.462 kg)  BMI 25.67 kg/m2 Well nourished, well developed, in no acute distress HEENT: normal Neck: no JVD Cardiac:  normal S1, S2; RRR; no murmur Lungs:  clear to auscultation bilaterally, no wheezing, rhonchi or rales Abd: soft, nontender, no  hepatomegaly Ext: no edema Skin: warm and dry Neuro:  CNs 2-12 intact, no focal abnormalities noted       ASSESSMENT AND PLAN:  1. OSA on CPAP  - since he is having a problem with tolerating the pressure at night due to a rapid rise in pressure due to intermittent severe mask leakage.  I have recommended that he have a mask refit with [facility] and then get a 2 week autotitration from 4 to 20cm H2O. 2. HTN - well controlled  - continue Amlodipine/spironolactone/valsartan/Lasix  Followup with me in 6 months  Signed, Armanda Magic, MD 12/24/2012 3:50 PM

## 2013-01-03 ENCOUNTER — Ambulatory Visit: Payer: Self-pay | Admitting: Cardiology

## 2013-01-04 ENCOUNTER — Encounter: Payer: Self-pay | Admitting: Cardiology

## 2013-01-05 ENCOUNTER — Ambulatory Visit (INDEPENDENT_AMBULATORY_CARE_PROVIDER_SITE_OTHER): Payer: Medicare Other | Admitting: Interventional Cardiology

## 2013-01-05 ENCOUNTER — Encounter: Payer: Self-pay | Admitting: Interventional Cardiology

## 2013-01-05 VITALS — BP 129/74 | HR 72 | Ht 71.0 in | Wt 182.0 lb

## 2013-01-05 DIAGNOSIS — E785 Hyperlipidemia, unspecified: Secondary | ICD-10-CM

## 2013-01-05 DIAGNOSIS — I10 Essential (primary) hypertension: Secondary | ICD-10-CM

## 2013-01-05 DIAGNOSIS — Z950 Presence of cardiac pacemaker: Secondary | ICD-10-CM

## 2013-01-05 DIAGNOSIS — I4891 Unspecified atrial fibrillation: Secondary | ICD-10-CM

## 2013-01-05 DIAGNOSIS — I482 Chronic atrial fibrillation, unspecified: Secondary | ICD-10-CM

## 2013-01-05 NOTE — Patient Instructions (Signed)
Your physician recommends that you continue on your current medications as directed. Please refer to the Current Medication list given to you today.  Your physician wants you to follow-up in: 1 year. You will receive a reminder letter in the mail two months in advance. If you don't receive a letter, please call our office to schedule the follow-up appointment.  

## 2013-01-05 NOTE — Progress Notes (Signed)
Patient ID: Oscar Santiago, male   DOB: March 19, 1925, 79 y.o.   MRN: [id number] Medical History: Chronic atrial fibrillation, pacemaker, no Coumadin, cardiology Dr. Katrinka Blazing, Hypertension, Prior gastrointestinal bleeding. 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 Luellen Pucker, Glaucoma, Hyperlipidemia, Osteopenia, T11 compression fracture, T10 compression fracture in 2008, History of kidney stone, Gout, Allergy to nurses, Mitral regurgitation, cardiac catheter in 2002, OSA, CPAP , Hypothyroidism., CKD stage 3 Cr 1.5-1.8, Intermittent dizziness, Hearing loss, Major depression.     [address] [place], [place]  [address] Phone: [phone] Fax:  [phone]  Date:  01/05/2013   ID:  Oscar Santiago, DOB [birth year], MRN [id number]  PCP:  Georgann Housekeeper, MD   ASSESSMENT:  1. Chronic atrial fibrillation, with rate control 2. Hypertension, controlled. 3. DDD pacer with reported normal function 4. Inability to use anticoagulation due to prior bleed and patient refusal.  PLAN:  1. No change in the current medical regimen. 2. Continue f/u in the device clinic.   SUBJECTIVE: Oscar Santiago is a 79 y.o. male who is doing well from the cardiac standpoint. He denies angina and dyspnea. He denies syncope and there is no edema or palpitation. Overall, very stable over the past year.   Wt Readings from Last 3 Encounters:  01/05/13 182 lb (82.555 kg)  12/24/12 184 lb (83.462 kg)     Past Medical History  Diagnosis Date  . Hypertension   . Chronic atrial fibrillation     refuses coumadin due to history of GI bleed  . Seizure disorder   . Glaucoma   . Hyperlipidemia   . Osteopenia   . Compression fracture of spine     T 10 and T11  . Kidney stone   . Gout   . OSA (obstructive sleep apnea)     on CPAP therapy  . Hypothyroid   . Inguinal hernia   . Hemorrhoid     Current  Outpatient Prescriptions  Medication Sig Dispense Refill  . amLODipine (NORVASC) 10 MG tablet Take 10 mg by mouth daily.      Marland Kitchen aspirin 81 MG tablet Take 81 mg by mouth.      . brimonidine (ALPHAGAN P) 0.1 % SOLN       . cetirizine (ZYRTEC) 10 MG tablet Take 10 mg by mouth daily.      . colchicine (COLCRYS) 0.6 MG tablet Take 0.6 mg by mouth daily.      . fluticasone (FLONASE) 50 MCG/ACT nasal spray Place into both nostrils daily. At night      . furosemide (LASIX) 10 MG/ML solution Take by mouth daily.      Marland Kitchen levETIRAcetam (KEPPRA) 750 MG tablet Take 750 mg by mouth 2 (two) times daily. 250 mg in am 500 in pm      . levothyroxine (SYNTHROID, LEVOTHROID) 25 MCG tablet Take 25 mcg by mouth daily before breakfast.      . polyethylene glycol powder (MIRALAX) powder Take 1 Container by mouth once.      Marland Kitchen spironolactone (ALDACTONE) 25 MG tablet Take 25 mg by mouth daily.      . valsartan (DIOVAN) 320 MG tablet Take 320 mg by mouth daily. Stopped for Nov and Dec only due to doughnut hole      . valsartan-hydrochlorothiazide (DIOVAN-HCT) 320-25 MG per tablet Take 1 tablet by mouth daily. For the month of Nov and Dec only      .  zolpidem (AMBIEN) 5 MG tablet Take 5 mg by mouth at bedtime as needed for sleep.       No current facility-administered medications for this visit.    Allergies:    Allergies  Allergen Reactions  . Coumadin [Warfarin Sodium]     GI Bleed   . Lisinopril Cough    Social History:  The patient  reports that he has never smoked. He does not have any smokeless tobacco history on file. He reports that he does not drink alcohol or use illicit drugs.   ROS:  Please see the history of present illness.   Loosing weight. Poor appetite. Denies dysphagia and choking.   All other systems reviewed and negative.   OBJECTIVE: VS:  BP 129/74  Pulse 72  Ht 5\' 11"  (1.803 m)  Wt 182 lb (82.555 kg)  BMI 25.40 kg/m2 Well nourished, well developed, in no acute distress, elderly HEENT:  normal Neck: JVD flat while sitting. Carotid bruit absent  Cardiac:  normal S1, S2; RRR; no murmur Lungs:  clear to auscultation bilaterally, no wheezing, rhonchi or rales Abd: soft, nontender, no hepatomegaly Ext: Edema absent. Pulses present Skin: warm and dry Neuro:  CNs 2-12 intact, no focal abnormalities noted  EKG:  Not performed.       Signed, Darci Needle III, MD 01/05/2013 3:34 PM

## 2013-01-25 ENCOUNTER — Encounter: Payer: Self-pay | Admitting: Internal Medicine

## 2013-01-25 ENCOUNTER — Encounter: Payer: Self-pay | Admitting: Cardiology

## 2013-02-15 ENCOUNTER — Encounter: Payer: Self-pay | Admitting: Internal Medicine

## 2013-02-16 ENCOUNTER — Encounter: Payer: Medicare Other | Admitting: Internal Medicine

## 2013-03-09 ENCOUNTER — Ambulatory Visit (INDEPENDENT_AMBULATORY_CARE_PROVIDER_SITE_OTHER): Payer: Medicare Other | Admitting: Internal Medicine

## 2013-03-09 ENCOUNTER — Encounter: Payer: Self-pay | Admitting: Internal Medicine

## 2013-03-09 VITALS — BP 150/84 | HR 70 | Ht 69.0 in | Wt 191.0 lb

## 2013-03-09 DIAGNOSIS — Z950 Presence of cardiac pacemaker: Secondary | ICD-10-CM

## 2013-03-09 DIAGNOSIS — I442 Atrioventricular block, complete: Secondary | ICD-10-CM

## 2013-03-09 DIAGNOSIS — I4891 Unspecified atrial fibrillation: Secondary | ICD-10-CM

## 2013-03-09 DIAGNOSIS — I10 Essential (primary) hypertension: Secondary | ICD-10-CM

## 2013-03-09 DIAGNOSIS — I519 Heart disease, unspecified: Secondary | ICD-10-CM

## 2013-03-09 DIAGNOSIS — I482 Chronic atrial fibrillation, unspecified: Secondary | ICD-10-CM

## 2013-03-09 LAB — MDC_IDC_ENUM_SESS_TYPE_INCLINIC
Battery Impedance: 2816 Ohm
Battery Remaining Longevity: 22 mo
Battery Voltage: 2.72 V
Brady Statistic RV Percent Paced: 85 %
Lead Channel Impedance Value: 0 Ohm
Lead Channel Pacing Threshold Amplitude: 0.75 V
Lead Channel Setting Pacing Amplitude: 2 V
Lead Channel Setting Pacing Pulse Width: 0.4 ms
Lead Channel Setting Sensing Sensitivity: 4 mV
MDC IDC MSMT LEADCHNL RV IMPEDANCE VALUE: 472 Ohm
MDC IDC MSMT LEADCHNL RV PACING THRESHOLD PULSEWIDTH: 0.4 ms
MDC IDC MSMT LEADCHNL RV SENSING INTR AMPL: 15.67 mV
MDC IDC SESS DTM: 20150204151713

## 2013-03-09 NOTE — Patient Instructions (Signed)
Remote monitoring is used to monitor your Pacemaker of ICD from home. This monitoring reduces the number of office visits required to check your device to one time per year. It allows us to keep an eye on the functioning of your device to ensure it is working properly. You are scheduled for a device check from home on 06/09/13. You may send your transmission at any time that day. If you have a wireless device, the transmission will be sent automatically. After your physician reviews your transmission, you will receive a postcard with your next transmission date.     Your physician wants you to follow-up in: 12 months with Dr Jacquiline DoeAllred You will receive a reminder letter in the mail two months in advance. If you don't receive a letter, please call our office to schedule the follow-up appointment.

## 2013-03-13 ENCOUNTER — Encounter: Payer: Self-pay | Admitting: Internal Medicine

## 2013-03-13 DIAGNOSIS — I519 Heart disease, unspecified: Secondary | ICD-10-CM | POA: Insufficient documentation

## 2013-03-13 DIAGNOSIS — I442 Atrioventricular block, complete: Secondary | ICD-10-CM | POA: Insufficient documentation

## 2013-03-13 NOTE — Progress Notes (Signed)
Georgann HousekeeperHUSAIN,KARRAR, MD: Primary Cardiologist:  Dr Katina Degreeurner  Mckay N Nachreiner is a 80 y.o. male with a h/o complete heart block sp PPM (MDT) by Dr Amil AmenEdmunds who presents today to establish care in the Electrophysiology device clinic.   The patient reports doing very well since having a pacemaker implanted.  He is elderly and his activity is limited. Today, he  denies symptoms of palpitations, chest pain, shortness of breath, orthopnea, PND, lower extremity edema, dizziness, presyncope, syncope, or neurologic sequela.  The patientis tolerating medications without difficulties and is otherwise without complaint today.   Past Medical History  Diagnosis Date  . Hypertension   . Seizure disorder   . Glaucoma   . Hyperlipidemia   . Osteopenia   . Compression fracture of spine     T 10 and T11  . Gout   . OSA (obstructive sleep apnea)     on CPAP therapy  . Hypothyroid   . Inguinal hernia   . Hemorrhoid   . Chronic systolic dysfunction of left ventricle     EF 30-35% by echo 10/10/2009  . Major depression   . History of GI bleed     from coumadin  . MR (mitral regurgitation)   . Hearing loss   . Dizziness     intermittent  . Kidney stone   . CKD (chronic kidney disease), stage III   . Permanent atrial fibrillation     refuses coumadin due to history of GI bleed  . Decreased appetite 03/02/12  . Weight loss 03/02/12  . Complete heart block    Past Surgical History  Procedure Laterality Date  . Transurethral resection of prostate    . Cardiac catheterization  2002  . Pacemaker insertion  07/28/06    VVI pacemaker for complete heart block by Dr Amil AmenEdmunds  . Inguinal hernia repair    . Hemorrhoid surgery      History   Social History  . Marital Status: Widowed    Spouse Name: N/A    Number of Children: N/A  . Years of Education: N/A   Occupational History  . Not on file.   Social History Main Topics  . Smoking status: Never Smoker   . Smokeless tobacco: Not on file  . Alcohol  Use: No  . Drug Use: No  . Sexual Activity: Not on file   Other Topics Concern  . Not on file   Social History Narrative  . No narrative on file    Family History  Problem Relation Age of Onset  . Heart failure Mother   . CVA Sister   . Aneurysm Brother     brain    Allergies  Allergen Reactions  . Coumadin [Warfarin Sodium]     GI Bleed   . Lisinopril Cough    Current Outpatient Prescriptions  Medication Sig Dispense Refill  . amLODipine (NORVASC) 10 MG tablet Take 10 mg by mouth as needed.       Marland Kitchen. aspirin 81 MG tablet Take 81 mg by mouth.      . Aspirin-Salicylamide-Caffeine (BC HEADACHE PO) Takes 1 packet about three times a week      . brimonidine (ALPHAGAN P) 0.1 % SOLN 1 drop daily.       . cetirizine (ZYRTEC) 10 MG tablet Take 10 mg by mouth as needed.       Marland Kitchen. CHERRY PO Take 1 capsule by mouth daily.      . colchicine (COLCRYS) 0.6 MG tablet Take  0.6 mg by mouth as needed.       . Cyanocobalamin (VITAMIN B 12 PO) Take 1,000 mg by mouth daily.      . fluticasone (FLONASE) 50 MCG/ACT nasal spray Place into both nostrils as needed. At night      . furosemide (LASIX) 10 MG/ML solution Take 10 mg by mouth daily.       Marland Kitchen levETIRAcetam (KEPPRA) 750 MG tablet Take 750 mg by mouth 2 (two) times daily.       Marland Kitchen levothyroxine (SYNTHROID, LEVOTHROID) 25 MCG tablet Take 25 mcg by mouth daily before breakfast.      . Multiple Vitamin (MULTIVITAMIN) capsule Take 1 capsule by mouth daily.      . polyethylene glycol powder (MIRALAX) powder Take 1 Container by mouth as needed.       Marland Kitchen spironolactone (ALDACTONE) 25 MG tablet Take 25 mg three times a week      . valsartan (DIOVAN) 320 MG tablet Take 320 mg by mouth daily.       Marland Kitchen zolpidem (AMBIEN) 5 MG tablet Take 5 mg by mouth at bedtime as needed for sleep.       No current facility-administered medications for this visit.    ROS- all systems are reviewed and negative except as per HPI  Physical Exam: Filed Vitals:    03/09/13 1448  BP: 150/84  Pulse: 70  Height: 5\' 9"  (1.753 m)  Weight: 191 lb (86.637 kg)    GEN- The patient is elderly appearing, alert and oriented x 3 today.   Head- normocephalic, atraumatic Eyes-  Sclera clear, conjunctiva pink Ears- hearing intact Oropharynx- clear Neck- supple,  Lungs- Clear to ausculation bilaterally, normal work of breathing Chest- pacemaker pocket is well healed Heart- Regular rate and rhythm (paced) GI- soft, NT, ND, + BS Extremities- no clubbing, cyanosis, or edema MS- no significant deformity or atrophy Skin- no rash or lesion Psych- euthymic mood, full affect Neuro- strength and sensation are intact  Pacemaker interrogation- reviewed in detail today,  See PACEART report  Assessment and Plan:  1. Complete heart block Normal pacemaker function See Pace Art report No changes today  2. Permanent afib Rate controlled Not felt to be a candidate for anticoagulation due to prior GI bleeding  3. Chronic systolic dysfunction Stable Given advanced age, would recommend medical therapy only.  He is not a candidate for device upgrade  4. HTN Stable No change required today   [facility] Return to see me or Brooke in 1 year Follow-up with Dr Mayford Knife as scheduled

## 2013-03-21 ENCOUNTER — Encounter: Payer: Self-pay | Admitting: Internal Medicine

## 2013-04-15 ENCOUNTER — Other Ambulatory Visit: Payer: Self-pay | Admitting: Nephrology

## 2013-04-15 DIAGNOSIS — N183 Chronic kidney disease, stage 3 unspecified: Secondary | ICD-10-CM

## 2013-04-18 ENCOUNTER — Ambulatory Visit
Admission: RE | Admit: 2013-04-18 | Discharge: 2013-04-18 | Disposition: A | Discharge status: Home / Self Care | Payer: Medicare Other | Source: Ambulatory Visit | Attending: Nephrology | Admitting: Nephrology

## 2013-04-18 DIAGNOSIS — N183 Chronic kidney disease, stage 3 unspecified: Secondary | ICD-10-CM

## 2013-05-19 ENCOUNTER — Telehealth: Payer: Self-pay

## 2013-05-19 MED ORDER — FUROSEMIDE 20 MG PO TABS: 20.0000 mg | ORAL_TABLET | Freq: Every day | ORAL | Status: DC

## 2013-05-19 NOTE — Telephone Encounter (Signed)
spoke with pt granddaughter she confirmed that pt take lasix 20mg  tablet daily.adv her a refill rqst was receievd from [facility] pt med listed is listed as 10mg  lasix solution.pt med list corrected and refills sent.

## 2013-06-09 ENCOUNTER — Encounter: Payer: Medicare Other | Admitting: *Deleted

## 2013-06-09 ENCOUNTER — Telehealth: Payer: Self-pay | Admitting: Cardiology

## 2013-06-09 NOTE — Telephone Encounter (Signed)
LM reminding pt about remote monitoring that needs to be completed today.

## 2013-09-10 ENCOUNTER — Emergency Department (INDEPENDENT_AMBULATORY_CARE_PROVIDER_SITE_OTHER)
Admission: EM | Admit: 2013-09-10 | Discharge: 2013-09-10 | Disposition: A | Discharge status: Home / Self Care | Payer: Medicare Other | Source: Home / Self Care

## 2013-09-10 ENCOUNTER — Encounter (HOSPITAL_COMMUNITY): Payer: Self-pay | Admitting: Emergency Medicine

## 2013-09-10 ENCOUNTER — Emergency Department (INDEPENDENT_AMBULATORY_CARE_PROVIDER_SITE_OTHER): Payer: Medicare Other

## 2013-09-10 DIAGNOSIS — R05 Cough: Secondary | ICD-10-CM

## 2013-09-10 DIAGNOSIS — R059 Cough, unspecified: Secondary | ICD-10-CM

## 2013-09-10 DIAGNOSIS — J3089 Other allergic rhinitis: Secondary | ICD-10-CM

## 2013-09-10 DIAGNOSIS — R0982 Postnasal drip: Secondary | ICD-10-CM

## 2013-09-10 MED ORDER — PREDNISONE 20 MG PO TABS: ORAL_TABLET | ORAL | Status: DC

## 2013-09-10 MED ORDER — GUAIFENESIN-CODEINE 100-10 MG/5ML PO SYRP: 5.0000 mL | ORAL_SOLUTION | ORAL | Status: DC | PRN

## 2013-09-10 NOTE — ED Provider Notes (Signed)
CSN: [id number]     Arrival date & time 09/10/13  1854 History   First MD Initiated Contact with Patient 09/10/13 2009     Chief Complaint  Patient presents with  . Cough   (Consider location/radiation/quality/duration/timing/severity/associated sxs/prior Treatment) HPI Comments: 80 year old male complaining of chest congestion and cough for 10-12 days. He is accompanied by significant other. He denies fever or shortness of breath. He comes in this evening because the cough is getting worse. He is having PND associated with the cough and at nighttime it is worse when supine. He is clearing his throat often.   Past Medical History  Diagnosis Date  . Hypertension   . Seizure disorder   . Glaucoma   . Hyperlipidemia   . Osteopenia   . Compression fracture of spine     T 10 and T11  . Gout   . OSA (obstructive sleep apnea)     on CPAP therapy  . Hypothyroid   . Inguinal hernia   . Hemorrhoid   . Chronic systolic dysfunction of left ventricle     EF 30-35% by echo 10/10/2009  . Major depression   . History of GI bleed     from coumadin  . MR (mitral regurgitation)   . Hearing loss   . Dizziness     intermittent  . Kidney stone   . CKD (chronic kidney disease), stage III   . Permanent atrial fibrillation     refuses coumadin due to history of GI bleed  . Decreased appetite 03/02/12  . Weight loss 03/02/12  . Complete heart block    Past Surgical History  Procedure Laterality Date  . Transurethral resection of prostate    . Cardiac catheterization  2002  . Pacemaker insertion  07/28/06    VVI pacemaker for complete heart block by Dr Amil Amen  . Inguinal hernia repair    . Hemorrhoid surgery     Family History  Problem Relation Age of Onset  . Heart failure Mother   . CVA Sister   . Aneurysm Brother     brain   History  Substance Use Topics  . Smoking status: Never Smoker   . Smokeless tobacco: Not on file  . Alcohol Use: No    Review of Systems  Constitutional:  Positive for activity change. Negative for fever, diaphoresis and fatigue.  HENT: Positive for congestion, postnasal drip and sore throat. Negative for ear pain, facial swelling, rhinorrhea, sinus pressure and trouble swallowing.   Eyes: Negative for pain, discharge and redness.  Respiratory: Positive for cough. Negative for chest tightness and shortness of breath.   Cardiovascular: Negative.  Negative for chest pain.  Gastrointestinal: Negative.   Musculoskeletal: Negative.  Negative for neck pain and neck stiffness.  Neurological: Negative.     Allergies  Coumadin and Lisinopril  Home Medications   Prior to Admission medications   Medication Sig Start Date End Date Taking? Authorizing Provider  amLODipine (NORVASC) 10 MG tablet Take 10 mg by mouth as needed.     Historical Provider, MD  aspirin 81 MG tablet Take 81 mg by mouth.    Historical Provider, MD  Aspirin-Salicylamide-Caffeine (BC HEADACHE PO) Takes 1 packet about three times a week    Historical Provider, MD  brimonidine (ALPHAGAN P) 0.1 % SOLN 1 drop daily.     Historical Provider, MD  cetirizine (ZYRTEC) 10 MG tablet Take 10 mg by mouth as needed.     Historical Provider, MD  CHERRY PO Take  1 capsule by mouth daily.    Historical Provider, MD  colchicine (COLCRYS) 0.6 MG tablet Take 0.6 mg by mouth as needed.     Historical Provider, MD  Cyanocobalamin (VITAMIN B 12 PO) Take 1,000 mg by mouth daily.    Historical Provider, MD  fluticasone (FLONASE) 50 MCG/ACT nasal spray Place into both nostrils as needed. At night    Historical Provider, MD  furosemide (LASIX) 20 MG tablet Take 1 tablet (20 mg total) by mouth daily. 05/19/13   Lyn RecordsHenry W Smith III, MD  guaiFENesin-codeine [facility](CHERATUSSIN AC) 100-10 MG/5ML syrup Take 5 mLs by mouth every 4 (four) hours as needed for cough or congestion. 09/10/13   Hayden Rasmussenavid Denese Mentink, NP  levETIRAcetam (KEPPRA) 750 MG tablet Take 750 mg by mouth 2 (two) times daily.     Historical Provider, MD  levothyroxine  (SYNTHROID, LEVOTHROID) 25 MCG tablet Take 25 mcg by mouth daily before breakfast.    Historical Provider, MD  Multiple Vitamin (MULTIVITAMIN) capsule Take 1 capsule by mouth daily.    Historical Provider, MD  polyethylene glycol powder (MIRALAX) powder Take 1 Container by mouth as needed.     Historical Provider, MD  predniSONE (DELTASONE) 20 MG tablet Take 2 tabs for 3 days then 1 tab for 3 days. Take with food. 09/10/13   Hayden Rasmussenavid Vantasia Pinkney, NP  spironolactone (ALDACTONE) 25 MG tablet Take 25 mg three times a week    Historical Provider, MD  valsartan (DIOVAN) 320 MG tablet Take 320 mg by mouth daily.     Historical Provider, MD  zolpidem (AMBIEN) 5 MG tablet Take 5 mg by mouth at bedtime as needed for sleep.    Historical Provider, MD   BP 162/70  Pulse 84  Temp(Src) 98.4 F (36.9 C) (Oral)  SpO2 96% Physical Exam  Nursing note and vitals reviewed. Constitutional: He is oriented to person, place, and time. He appears well-developed and well-nourished. No distress.  HENT:  Right Ear: External ear normal.  Left Ear: External ear normal.  Mouth/Throat: No oropharyngeal exudate.  Oropharynx with moderate erythema and cobblestoning. Scant clear PND.  Eyes: Conjunctivae and EOM are normal.  Neck: Normal range of motion. Neck supple.  Cardiovascular: Normal rate, regular rhythm and normal heart sounds.   Pulmonary/Chest: Effort normal and breath sounds normal. No respiratory distress. He has no wheezes. He has no rales.  Abdominal: Soft. He exhibits no distension.  Musculoskeletal: He exhibits no edema.  Lymphadenopathy:    He has no cervical adenopathy.  Neurological: He is alert and oriented to person, place, and time.  Skin: Skin is warm and dry.  Psychiatric: He has a normal mood and affect.    ED Course  Procedures (including critical care time) Labs Review Labs Reviewed - No data to display  Imaging Review Dg Chest 2 View  09/10/2013   CLINICAL DATA:  Cough, congestion  EXAM: CHEST   2 VIEW  COMPARISON:  01/21/2012  FINDINGS: Lungs are clear.  No pleural effusion or pneumothorax.  The heart is normal in size.  Left subclavian pacemaker.  IMPRESSION: No evidence of acute cardiopulmonary disease.   Electronically Signed   By: Charline BillsSriyesh  Krishnan M.D.   On: 09/10/2013 20:20     MDM   1. Other allergic rhinitis   2. PND (post-nasal drip)   3. Cough    Try changing to Allegra 180 mg a day Plenty of fluids If needed use chlortrimeton 2 mg at night. Continue the Flonase Cheratussin AC Low dose prednisone  Hayden Rasmussen, NP 09/10/13 2035

## 2013-09-10 NOTE — Discharge Instructions (Signed)
Allergic Rhinitis Try changing to Allegra 180 mg a day Plenty of fluids If needed use chlortrimeton 2 mg at night. Continue the Flonase Allergic rhinitis is when the mucous membranes in the nose respond to allergens. Allergens are particles in the air that cause your body to have an allergic reaction. This causes you to release allergic antibodies. Through a chain of events, these eventually cause you to release histamine into the blood stream. Although meant to protect the body, it is this release of histamine that causes your discomfort, such as frequent sneezing, congestion, and an itchy, runny nose.  CAUSES  Seasonal allergic rhinitis (hay fever) is caused by pollen allergens that may come from grasses, trees, and weeds. Year-round allergic rhinitis (perennial allergic rhinitis) is caused by allergens such as house dust mites, pet dander, and mold spores.  SYMPTOMS   Nasal stuffiness (congestion).  Itchy, runny nose with sneezing and tearing of the eyes. DIAGNOSIS  Your health care provider can help you determine the allergen or allergens that trigger your symptoms. If you and your health care provider are unable to determine the allergen, skin or blood testing may be used. TREATMENT  Allergic rhinitis does not have a cure, but it can be controlled by:  Medicines and allergy shots (immunotherapy).  Avoiding the allergen. Hay fever may often be treated with antihistamines in pill or nasal spray forms. Antihistamines block the effects of histamine. There are over-the-counter medicines that may help with nasal congestion and swelling around the eyes. Check with your health care provider before taking or giving this medicine.  If avoiding the allergen or the medicine prescribed do not work, there are many new medicines your health care provider can prescribe. Stronger medicine may be used if initial measures are ineffective. Desensitizing injections can be used if medicine and avoidance does  not work. Desensitization is when a patient is given ongoing shots until the body becomes less sensitive to the allergen. Make sure you follow up with your health care provider if problems continue. HOME CARE INSTRUCTIONS It is not possible to completely avoid allergens, but you can reduce your symptoms by taking steps to limit your exposure to them. It helps to know exactly what you are allergic to so that you can avoid your specific triggers. SEEK MEDICAL CARE IF:   You have a fever.  You develop a cough that does not stop easily (persistent).  You have shortness of breath.  You start wheezing.  Symptoms interfere with normal daily activities. Document Released: 10/15/2000 Document Revised: 01/25/2013 Document Reviewed: 09/27/2012 [facility] Patient Information 2015 [employer] [place]. This information is not intended to replace advice given to you by your health care provider. Make sure you discuss any questions you have with your health care provider.  Cough, Adult  A cough is a reflex that helps clear your throat and airways. It can help heal the body or may be a reaction to an irritated airway. A cough may only last 2 or 3 weeks (acute) or may last more than 8 weeks (chronic).  CAUSES Acute cough:  Viral or bacterial infections. Chronic cough:  Infections.  Allergies.  Asthma.  Post-nasal drip.  Smoking.  Heartburn or acid reflux.  Some medicines.  Chronic lung problems (COPD).  Cancer. SYMPTOMS   Cough.  Fever.  Chest pain.  Increased breathing rate.  High-pitched whistling sound when breathing (wheezing).  Colored mucus that you cough up (sputum). TREATMENT   A bacterial cough may be treated with antibiotic medicine.  A viral cough must run its course and will not respond to antibiotics.  Your caregiver may recommend other treatments if you have a chronic cough. HOME CARE INSTRUCTIONS   Only take over-the-counter or prescription medicines for pain,  discomfort, or fever as directed by your caregiver. Use cough suppressants only as directed by your caregiver.  Use a cold steam vaporizer or humidifier in your bedroom or home to help loosen secretions.  Sleep in a semi-upright position if your cough is worse at night.  Rest as needed.  Stop smoking if you smoke. SEEK IMMEDIATE MEDICAL CARE IF:   You have pus in your sputum.  Your cough starts to worsen.  You cannot control your cough with suppressants and are losing sleep.  You begin coughing up blood.  You have difficulty breathing.  You develop pain which is getting worse or is uncontrolled with medicine.  You have a fever. MAKE SURE YOU:   Understand these instructions.  Will watch your condition.  Will get help right away if you are not doing well or get worse. Document Released: 07/19/2010 Document Revised: 04/14/2011 Document Reviewed: 07/19/2010 [facility] Patient Information 2015 [place], [place]. This information is not intended to replace advice given to you by your health care provider. Make sure you discuss any questions you have with your health care provider.

## 2013-09-10 NOTE — ED Notes (Signed)
Pt  Reports  Symptoms  Of  Cough  /  Congestion  With  Body  Aches       X  10 -12  Days            Pt  Reports       That     He  Has  A  Pacemaker    But  denys  Any  Chest  Pain

## 2013-09-11 NOTE — ED Provider Notes (Signed)
Medical screening examination/treatment/procedure(s) were performed by non-physician practitioner and as supervising physician I was immediately available for consultation/collaboration.  Mayline Dragon, M.D.  Danasia Baker C Chanan Detwiler, MD 09/11/13 0840 

## 2013-10-05 ENCOUNTER — Encounter: Payer: Self-pay | Admitting: *Deleted

## 2013-10-21 ENCOUNTER — Telehealth: Payer: Self-pay | Admitting: Internal Medicine

## 2013-10-21 NOTE — Telephone Encounter (Signed)
Ok with me as long as they are aware of my limited availability

## 2013-10-21 NOTE — Telephone Encounter (Signed)
Pt would like to know if you will accept him as a pt?  His granddaughter sees you and his daughter see you Research scientist (physical sciences)) . Pt is aware of your schedule.  However, granddaughter states pt does need fup every 3- 4 months for ongoing health issues.

## 2013-11-01 NOTE — Telephone Encounter (Signed)
Done

## 2013-11-15 ENCOUNTER — Encounter: Payer: Self-pay | Admitting: Cardiology

## 2013-11-29 ENCOUNTER — Other Ambulatory Visit: Payer: Self-pay | Admitting: Internal Medicine

## 2013-12-01 ENCOUNTER — Ambulatory Visit (INDEPENDENT_AMBULATORY_CARE_PROVIDER_SITE_OTHER): Payer: Medicare Other | Admitting: *Deleted

## 2013-12-01 DIAGNOSIS — I442 Atrioventricular block, complete: Secondary | ICD-10-CM

## 2013-12-01 LAB — MDC_IDC_ENUM_SESS_TYPE_INCLINIC
Battery Remaining Longevity: 17 mo
Brady Statistic RV Percent Paced: 78 %
Lead Channel Impedance Value: 0 Ohm
Lead Channel Impedance Value: 438 Ohm
Lead Channel Pacing Threshold Amplitude: 0.75 V
Lead Channel Sensing Intrinsic Amplitude: 11.2 mV
Lead Channel Setting Pacing Amplitude: 2 V
Lead Channel Setting Pacing Pulse Width: 0.4 ms
MDC IDC MSMT BATTERY IMPEDANCE: 3314 Ohm
MDC IDC MSMT BATTERY VOLTAGE: 2.7 V
MDC IDC MSMT LEADCHNL RV PACING THRESHOLD PULSEWIDTH: 0.4 ms
MDC IDC SESS DTM: 20151029144802
MDC IDC SET LEADCHNL RV SENSING SENSITIVITY: 5.6 mV

## 2013-12-01 NOTE — Progress Notes (Signed)
Pacemaker check in clinic. Normal device function. Thresholds, sensing, impedances consistent with previous measurements. Device programmed to maximize longevity. 10 high ventricular rates noted the longest 6 seconds.  9.2% of his total heart beats are > 100bpm.  The patient refuses coumadin.   Device programmed at appropriate safety margins. Histogram distribution appropriate for patient activity level. Device programmed to optimize intrinsic conduction. Estimated longevity 17 months. Patient enrolled in remote follow-up/TTM's with [employer]. Plan to follow every 3 months remotely and see annually in office. Patient education completed.  ROV in February with Dr. Johney FrameAllred.

## 2013-12-07 ENCOUNTER — Encounter: Payer: Self-pay | Admitting: Cardiology

## 2013-12-15 ENCOUNTER — Encounter: Payer: Self-pay | Admitting: Internal Medicine

## 2013-12-19 ENCOUNTER — Ambulatory Visit (INDEPENDENT_AMBULATORY_CARE_PROVIDER_SITE_OTHER): Payer: Medicare Other | Admitting: Internal Medicine

## 2013-12-19 ENCOUNTER — Encounter: Payer: Self-pay | Admitting: Internal Medicine

## 2013-12-19 VITALS — BP 146/84 | HR 76 | Temp 98.0°F | Ht 70.5 in | Wt 176.0 lb

## 2013-12-19 DIAGNOSIS — N183 Chronic kidney disease, stage 3 unspecified: Secondary | ICD-10-CM | POA: Insufficient documentation

## 2013-12-19 DIAGNOSIS — I482 Chronic atrial fibrillation, unspecified: Secondary | ICD-10-CM

## 2013-12-19 DIAGNOSIS — G40909 Epilepsy, unspecified, not intractable, without status epilepticus: Secondary | ICD-10-CM

## 2013-12-19 DIAGNOSIS — I10 Essential (primary) hypertension: Secondary | ICD-10-CM

## 2013-12-19 DIAGNOSIS — I519 Heart disease, unspecified: Secondary | ICD-10-CM

## 2013-12-19 DIAGNOSIS — N289 Disorder of kidney and ureter, unspecified: Secondary | ICD-10-CM

## 2013-12-19 DIAGNOSIS — J069 Acute upper respiratory infection, unspecified: Secondary | ICD-10-CM

## 2013-12-19 NOTE — Assessment & Plan Note (Signed)
Followed by Dr. Eulogio Ditcholadanato.  Obtain copy of recent blood work.  Patient strongly encouraged to stop using OTC BC powder.

## 2013-12-19 NOTE — Assessment & Plan Note (Signed)
Patient's therapy for CT was seen in urgent care for upper respiratory symptoms 4-6 weeks ago. No report of cough or shortness of breath. Daughter concerned that he ate persistently clears his throat. His chest is clear and exam. He likely has resolving viral post nasal gtt.  Patient advised to use OTC intranasal saline.

## 2013-12-19 NOTE — Progress Notes (Addendum)
Subjective:    Patient ID: Oscar Santiago, male    DOB: 1926/01/07, 80 y.o.   MRN: [id number]  HPI  80 year old African-American male with history of hypertension, hyperlipidemia, chronic atrial fibrillation, seizure , chronic renal insufficiency and complete heart block to transfer care.  Patient previously followed by Dr. Donette Larry.  He is accompanied by supportive daughter.  Patient followed by cardiology-Dr. Katrinka Blazing. Patient is not on anticoagulation. He has history of gastrointestinal bleeding and he has apparently refused Coumadin therapy in the past.  Patient's last 2-D echocardiogram was in 2011. It showed moderately reduced LV function, global in nature. EF estimated at 30-35%. He has moderate concentric LVH. He has severe left atrial enlargement, mild mitral valve regurgitation, moderate tricuspid regurgitation and mildly elevated right ventricular systolic pressure.  Patient has history of complete heart block and was recently seen by Dr. Johney Frame.  S/P pacemaker.  He has history of seizure disorder. Daughter reports he is followed by Dr. Doreatha Martin at [facility]. Patient's daughter reports no trigger for seizures found. His last MRI was 3 years ago.  He no longer drives. His seizures controlled on Keppra.  History of chronic renal insufficiency.  He is followed by a nephrologist - Dr. Blanche East.  He uses over the counter BC powder despite counseling to stop medication.  Daughter reports patient having difficulty stopping pain medication. "It gives him a lift"  Patient daughter also reports he was seen in urgent care for respiratory congestion 4-6 weeks ago. No report of persistent productive cough. She occasionally clears his throat. Daughter unsure whether his symptoms from allergies.  Review of Systems  Constitutional: Negative for activity change, appetite change and unexpected weight change.  Ears:Chronic hearing loss (he usually wears hearing aids) Eyes: Negative for visual  disturbance.  Respiratory: Negative for cough, chest tightness and shortness of breath.   Cardiovascular: Negative for chest pain.  Genitourinary: Negative for difficulty urinating.  Neurological: Negative for headaches. Walks with single pronged cane.   He has issues with unsteady gait and dizziness.  He denies memory loss  Gastrointestinal: Negative for abdominal pain, heartburn melena or hematochezia Psych: He has been stressed since retiring as Education officer, environmental          Past Medical History  Diagnosis Date  . Hypertension   . Seizure disorder   . Glaucoma   . Hyperlipidemia   . Osteopenia   . Compression fracture of spine     T 10 and T11  . Gout   . OSA (obstructive sleep apnea)     on CPAP therapy  . Hypothyroid   . Inguinal hernia   . Hemorrhoid   . Chronic systolic dysfunction of left ventricle     EF 30-35% by echo 10/10/2009  . Major depression   . History of GI bleed     from coumadin  . MR (mitral regurgitation)   . Hearing loss   . Dizziness     intermittent  . Kidney stone   . CKD (chronic kidney disease), stage III   . Permanent atrial fibrillation     refuses coumadin due to history of GI bleed  . Decreased appetite 03/02/12  . Weight loss 03/02/12  . Complete heart block     History   Social History  . Marital Status: Widowed    Spouse Name: N/A    Number of Children: N/A  . Years of Education: N/A   Occupational History  . Not on file.   Social History Main  Topics  . Smoking status: Never Smoker   . Smokeless tobacco: Not on file  . Alcohol Use: No  . Drug Use: No  . Sexual Activity: Not on file   Other Topics Concern  . Not on file   Social History Narrative    Past Surgical History  Procedure Laterality Date  . Transurethral resection of prostate    . Cardiac catheterization  2002  . Pacemaker insertion  07/28/06    VVI pacemaker for complete heart block by Dr Amil AmenEdmunds  . Inguinal hernia repair    . Hemorrhoid surgery      Family  History  Problem Relation Age of Onset  . Heart failure Mother   . CVA Sister   . Aneurysm Brother     brain    Allergies  Allergen Reactions  . Coumadin [Warfarin Sodium]     GI Bleed   . Lisinopril Cough    Current Outpatient Prescriptions on File Prior to Visit  Medication Sig Dispense Refill  . amLODipine (NORVASC) 10 MG tablet Take 10 mg by mouth as needed.     Marland Kitchen. aspirin 81 MG tablet Take 81 mg by mouth.    . Aspirin-Salicylamide-Caffeine (BC HEADACHE PO) Takes 1 packet about three times a week    . brimonidine (ALPHAGAN P) 0.1 % SOLN 1 drop daily.     Marland Kitchen. CHERRY PO Take 1 capsule by mouth daily.    . colchicine (COLCRYS) 0.6 MG tablet Take 0.6 mg by mouth as needed.     . Cyanocobalamin (VITAMIN B 12 PO) Take 1,000 mg by mouth daily.    . fluticasone (FLONASE) 50 MCG/ACT nasal spray Place into both nostrils as needed. At night    . furosemide (LASIX) 20 MG tablet Take 1 tablet (20 mg total) by mouth daily. 30 tablet 9  . guaiFENesin-codeine (CHERATUSSIN AC) 100-10 MG/5ML syrup Take 5 mLs by mouth every 4 (four) hours as needed for cough or congestion. 120 mL 0  . levETIRAcetam (KEPPRA) 750 MG tablet Take 750 mg by mouth 2 (two) times daily.     Marland Kitchen. levothyroxine (SYNTHROID, LEVOTHROID) 25 MCG tablet Take 25 mcg by mouth daily before breakfast.    . Multiple Vitamin (MULTIVITAMIN) capsule Take 1 capsule by mouth daily.    . polyethylene glycol powder (MIRALAX) powder Take 1 Container by mouth as needed.     . valsartan (DIOVAN) 320 MG tablet Take 320 mg by mouth daily.     Marland Kitchen. zolpidem (AMBIEN) 5 MG tablet Take 5 mg by mouth at bedtime as needed for sleep.     No current facility-administered medications on file prior to visit.    BP 146/84 mmHg  Pulse 76  Temp(Src) 98 F (36.7 C) (Oral)  Ht 5' 10.5" (1.791 m)  Wt 176 lb (79.833 kg)  BMI 24.89 kg/m2    Objective:   Physical Exam  Constitutional: He is oriented to person, place, and time. He appears well-developed and  well-nourished. No distress.  HENT:  Head: Normocephalic and atraumatic.  Right Ear: External ear normal.  Left Ear: External ear normal.  Mouth/Throat: Oropharynx is clear and moist.  Eyes: Conjunctivae and EOM are normal. Pupils are equal, round, and reactive to light.  Bilateral arcus senilis  Neck: Neck supple.  No carotid bruit  Cardiovascular: Normal rate and normal heart sounds.   Irregular rhythm  Pulmonary/Chest: Effort normal and breath sounds normal. He has no wheezes.  Abdominal: Soft. Bowel sounds are normal. There is no  tenderness.  Musculoskeletal: He exhibits no edema.  Lymphadenopathy:    He has no cervical adenopathy.  Neurological: He is alert and oriented to person, place, and time.  Ambulates with single prong cane  Skin: Skin is warm and dry.  Psychiatric: He has a normal mood and affect. His behavior is normal.          Assessment & Plan:

## 2013-12-19 NOTE — Progress Notes (Signed)
Pre visit review using our clinic review tool, if applicable. No additional management support is needed unless otherwise documented below in the visit note. 

## 2013-12-19 NOTE — Assessment & Plan Note (Signed)
We discussed pros and cons of anticoagulation.  He has hx of GI bleed.  Daughter will revisit this issue with Dr. Katrinka BlazingSmith.  Consider Eliquis 2.5 mg twice daily.

## 2013-12-19 NOTE — Assessment & Plan Note (Signed)
Follow[facility] - Dr. Doreatha MartinSam.  Stable on Keppra.  Obtain copy of previous MRI of Brain.

## 2013-12-19 NOTE — Patient Instructions (Signed)
Please forward copy of your previous MRI of Brain Forward copy of your recent blood work Stop taking BC powder

## 2013-12-19 NOTE — Assessment & Plan Note (Addendum)
Followed and managed by Dr. Katrinka BlazingSmith.  He appears euvolemic on exam today.

## 2013-12-19 NOTE — Assessment & Plan Note (Signed)
Daughter reports BP higher due to recent stress of retirement. BP: (!) 146/84 mmHg

## 2014-02-10 ENCOUNTER — Encounter: Payer: Self-pay | Admitting: Family Medicine

## 2014-02-10 ENCOUNTER — Ambulatory Visit (INDEPENDENT_AMBULATORY_CARE_PROVIDER_SITE_OTHER): Payer: Medicare Other | Admitting: Family Medicine

## 2014-02-10 VITALS — BP 115/78 | HR 85 | Temp 97.5°F | Ht 70.5 in | Wt 181.0 lb

## 2014-02-10 DIAGNOSIS — R1084 Generalized abdominal pain: Secondary | ICD-10-CM

## 2014-02-10 MED ORDER — AMOXICILLIN 875 MG PO TABS: 875.0000 mg | ORAL_TABLET | Freq: Two times a day (BID) | ORAL | Status: DC

## 2014-02-10 NOTE — Progress Notes (Signed)
Pre visit review using our clinic review tool, if applicable. No additional management support is needed unless otherwise documented below in the visit note. 

## 2014-02-13 ENCOUNTER — Telehealth: Payer: Self-pay | Admitting: Internal Medicine

## 2014-02-13 NOTE — Telephone Encounter (Signed)
Pt grand daugher connie did not want to wait to see MD in gi on 03-27-14 nor to see PA sooner. Pt grand daugher decline appts. Pt grand daugher would like a referral to  see  another gi md sooner. Pt advise. Pt saw dr Clent Ridgesfry

## 2014-02-13 NOTE — Progress Notes (Signed)
   Subjective:    Patient ID: Oscar Santiago, male    DOB: 10/25/1925, 81 y.o.   MRN: [id number]  HPI Here with his wife to discuss recent weight loss, mild generalized abdominal cramps, and dark stools. No frank blood has been seen. No change in appetite. No N or V or fever.    Review of Systems  Constitutional: Positive for unexpected weight change. Negative for fever, chills, diaphoresis, activity change and appetite change.  Respiratory: Negative.   Cardiovascular: Negative.   Gastrointestinal: Positive for abdominal pain and diarrhea. Negative for constipation, blood in stool, abdominal distention and anal bleeding.  Genitourinary: Negative.        Objective:   Physical Exam  Constitutional: He appears well-developed and well-nourished. No distress.  Cardiovascular: Normal rate, regular rhythm, normal heart sounds and intact distal pulses.   Pulmonary/Chest: Effort normal and breath sounds normal.  Abdominal: Soft. Bowel sounds are normal. He exhibits no distension and no mass. There is no tenderness. There is no rebound and no guarding.          Assessment & Plan:  Abdominal cramps with dark stools and weight loss. He probably needs upper and lower endoscopy. We will refer him to GI.

## 2014-02-15 NOTE — Telephone Encounter (Signed)
I left voice message and advised pt to try and see if anyone cancels then they could give him a call. As fas as going through with another referral for another provider could take longer.

## 2014-02-16 ENCOUNTER — Encounter: Payer: Self-pay | Admitting: Gastroenterology

## 2014-02-21 ENCOUNTER — Other Ambulatory Visit (INDEPENDENT_AMBULATORY_CARE_PROVIDER_SITE_OTHER): Payer: Medicare Other

## 2014-02-21 ENCOUNTER — Ambulatory Visit (INDEPENDENT_AMBULATORY_CARE_PROVIDER_SITE_OTHER): Payer: Medicare Other | Admitting: Gastroenterology

## 2014-02-21 ENCOUNTER — Encounter: Payer: Self-pay | Admitting: Gastroenterology

## 2014-02-21 VITALS — BP 130/66 | HR 84 | Ht 70.5 in | Wt 181.2 lb

## 2014-02-21 DIAGNOSIS — R197 Diarrhea, unspecified: Secondary | ICD-10-CM

## 2014-02-21 DIAGNOSIS — R634 Abnormal weight loss: Secondary | ICD-10-CM | POA: Insufficient documentation

## 2014-02-21 LAB — COMPREHENSIVE METABOLIC PANEL
ALBUMIN: 3.9 g/dL (ref 3.5–5.2)
ALT: 17 U/L (ref 0–53)
AST: 31 U/L (ref 0–37)
Alkaline Phosphatase: 54 U/L (ref 39–117)
BUN: 21 mg/dL (ref 6–23)
CHLORIDE: 102 meq/L (ref 96–112)
CO2: 25 mEq/L (ref 19–32)
CREATININE: 1.39 mg/dL (ref 0.40–1.50)
Calcium: 9.1 mg/dL (ref 8.4–10.5)
GFR: 62.17 mL/min (ref >60.00)
GLUCOSE: 101 mg/dL — AB (ref 70–99)
Potassium: 3.9 mEq/L (ref 3.5–5.1)
SODIUM: 134 meq/L — AB (ref 135–145)
TOTAL PROTEIN: 7.4 g/dL (ref 6.0–8.3)
Total Bilirubin: 0.6 mg/dL (ref 0.2–1.2)

## 2014-02-21 LAB — CBC WITH DIFFERENTIAL/PLATELET
BASOS ABS: 0 10*3/uL (ref 0.0–0.1)
BASOS PCT: 0.6 % (ref 0.0–3.0)
Eosinophils Absolute: 0.2 10*3/uL (ref 0.0–0.7)
Eosinophils Relative: 4.6 % (ref 0.0–5.0)
HEMATOCRIT: 39.7 % (ref 39.0–52.0)
Hemoglobin: 13.1 g/dL (ref 13.0–17.0)
Lymphocytes Relative: 43.2 % (ref 12.0–46.0)
Lymphs Abs: 1.5 10*3/uL (ref 0.7–4.0)
MCHC: 33.2 g/dL (ref 30.0–36.0)
MCV: 86.7 fl (ref 78.0–100.0)
Monocytes Absolute: 0.4 10*3/uL (ref 0.1–1.0)
Monocytes Relative: 11.5 % (ref 3.0–12.0)
NEUTROS PCT: 40.1 % — AB (ref 43.0–77.0)
Neutro Abs: 1.4 10*3/uL (ref 1.4–7.7)
PLATELETS: 191 10*3/uL (ref 150.0–400.0)
RBC: 4.57 Mil/uL (ref 4.22–5.81)
RDW: 15.9 % — AB (ref 11.5–15.5)
WBC: 3.5 10*3/uL — ABNORMAL LOW (ref 4.0–10.5)

## 2014-02-21 LAB — TSH: TSH: 2.71 u[IU]/mL (ref 0.35–4.50)

## 2014-02-21 LAB — IGA: IgA: 408 mg/dL — ABNORMAL HIGH (ref 68–378)

## 2014-02-21 NOTE — Progress Notes (Signed)
02/21/2014 Oscar Santiago [id number] 02/06/1925   HISTORY OF PRESENT ILLNESS:  This is an 81 year old male who is new to our practice.  He presents to our office today with his grand-daughter to discuss issues with diarrhea and weight loss.  He says that he's been having these issues for a while, at least the past 6 months, but likely longer.  He says that he does not have any abdominal pain, but he always has growling and "rolling" in his stomach; says that it makes very loud noises.  Has 2-3 bowel movements per day, but they are almost always loose like diarrhea.  He denies seeing blood in his stools.  Says that they are very dark in color at times, but not black.  He admits that dairy products to do not agree with him and "go right through him"; even Lactaid and Ensure drinks do this to him.  He tries to avoid those products.  They also report a progressive weight loss saying that he has lost about 20 pounds in 6 months and even more than that over some time.  He has never undergone colonoscopy in the past.  We do not have any recent labs.  No nausea, vomiting, dysphagia, or other UGI symptoms are present.  He does have a seizure disorder and is on medication.  Has not had a grand mal seizure in 3 years or so, but has been having more frequent smaller (sounds like petite mal) seizures recently; these are occurring once a week or every other week.  [facility] Dr[facility] Monia SabalMaria Santiago, and she is aware of this, but he does not have an appt until April.  He has a pacemaker, which was placed for complete heart block.  Also has atrial fibrillation, but refuses coumadin to due bleeding issues in the past apparently.   Past Medical History  Diagnosis Date  . Hypertension   . Seizure disorder   . Glaucoma   . Hyperlipidemia   . Osteopenia   . Compression fracture of spine     T 10 and T11  . Gout   . OSA (obstructive sleep apnea)     on CPAP therapy  . Hypothyroid   . Inguinal  hernia   . Hemorrhoid   . Chronic systolic dysfunction of left ventricle     EF 30-35% by echo 10/10/2009  . Major depression   . History of GI bleed     from coumadin  . MR (mitral regurgitation)   . Hearing loss   . Dizziness     intermittent  . Kidney stone   . CKD (chronic kidney disease), stage III   . Permanent atrial fibrillation     refuses coumadin due to history of GI bleed  . Decreased appetite 03/02/12  . Weight loss 03/02/12  . Complete heart block    Past Surgical History  Procedure Laterality Date  . Transurethral resection of prostate    . Cardiac catheterization  2002  . Pacemaker insertion  07/28/06    VVI pacemaker for complete heart block by Dr Amil AmenEdmunds  . Inguinal hernia repair    . Hemorrhoid surgery      reports that he has never smoked. He has never used smokeless tobacco. He reports that he does not drink alcohol or use illicit drugs. family history includes Aneurysm in his brother; CVA in his sister; Heart failure in his mother. There is no history of Colon cancer or  Colon polyps. Allergies  Allergen Reactions  . Coumadin [Warfarin Sodium]     GI Bleed   . Lisinopril Cough      Outpatient Encounter Prescriptions as of 02/21/2014  Medication Sig  . alendronate (FOSAMAX) 70 MG tablet Take 70 mg by mouth once a week.   Marland Kitchen amLODipine (NORVASC) 10 MG tablet Take 10 mg by mouth as needed.   Marland Kitchen aspirin 81 MG tablet Take 81 mg by mouth.  . brimonidine (ALPHAGAN P) 0.1 % SOLN 1 drop daily.   Marland Kitchen CHERRY PO Take 1 capsule by mouth daily.  . colchicine (COLCRYS) 0.6 MG tablet Take 0.6 mg by mouth as needed.   . Cyanocobalamin (VITAMIN B 12 PO) Take 1,000 mg by mouth daily.  . dorzolamide (TRUSOPT) 2 % ophthalmic solution Place 1 drop into both eyes 2 (two) times daily.  . fluticasone (FLONASE) 50 MCG/ACT nasal spray Place into both nostrils as needed. At night  . furosemide (LASIX) 20 MG tablet Take 1 tablet (20 mg total) by mouth daily.  Marland Kitchen latanoprost (XALATAN)  0.005 % ophthalmic solution Place 1 drop into both eyes at bedtime.   . levETIRAcetam (KEPPRA) 750 MG tablet Take 750 mg by mouth 2 (two) times daily.   Marland Kitchen levothyroxine (SYNTHROID, LEVOTHROID) 25 MCG tablet Take 25 mcg by mouth daily before breakfast.  . Multiple Vitamin (MULTIVITAMIN) capsule Take 1 capsule by mouth daily.  . valsartan (DIOVAN) 320 MG tablet Take 320 mg by mouth daily.   Marland Kitchen zolpidem (AMBIEN) 5 MG tablet Take 5 mg by mouth at bedtime as needed for sleep.  . [DISCONTINUED] amoxicillin (AMOXIL) 875 MG tablet Take 1 tablet (875 mg total) by mouth 2 (two) times daily.  . [DISCONTINUED] Aspirin-Salicylamide-Caffeine (BC HEADACHE PO) Takes 1 packet about three times a week  . [DISCONTINUED] guaiFENesin-codeine (CHERATUSSIN AC) 100-10 MG/5ML syrup Take 5 mLs by mouth every 4 (four) hours as needed for cough or congestion.  . [DISCONTINUED] polyethylene glycol powder (MIRALAX) powder Take 1 Container by mouth as needed.      REVIEW OF SYSTEMS  : All other systems reviewed and negative except where noted in the History of Present Illness.   PHYSICAL EXAM: BP 130/66 mmHg  Pulse 84  Ht 5' 10.5" (1.791 m)  Wt 181 lb 4 oz (82.214 kg)  BMI 25.63 kg/m2 General: Well developed black male in no acute distress Head: Normocephalic and atraumatic Eyes:  Sclerae anicteric, conjunctiva pink. Ears: Normal auditory acuity Lungs: Clear throughout to auscultation Heart: Regular rate and rhythm Abdomen: Soft, non-distended.  Normal bowel sounds.  Mild mid-abdominal TTP without R/R/G. Musculoskeletal: Symmetrical with no gross deformities  Skin: No lesions on visible extremities Extremities: No edema  Neurological: Alert oriented x 4, grossly non-focal Psychological:  Alert and cooperative. Normal mood and affect  ASSESSMENT AND PLAN: -81 year old male with diarrhea for at least the past 6 months with progressive weight loss:  Will check CT scan of the abdomen and pelvis to rule out  malignancy.  Never had a colonoscopy so we will schedule that as well with Dr. Leone Payor.  The risks, benefits, and alternatives were discussed with the patient and he consents to proceed.  We will schedule this at the hospital due to his seizure disorder and having more frequent mild seizures recently (his neurologist is aware, but he does not have an appt until April).  Will check labs including a CBC, CMP, TSH, and celiac labs.  He may have a component of lactose intolerance  as a cause for his diarrhea, but this should not explain the weight loss.

## 2014-02-21 NOTE — Patient Instructions (Signed)
You have been scheduled for a colonoscopy. Please follow written instructions given to you at your visit today.  Please pick up your prep kit at the pharmacy within the next 1-3 days. If you use inhalers (even only as needed), please bring them with you on the day of your procedure. Your physician has requested that you go to www.startemmi.com and enter the access code given to you at your visit today. This web site gives a general overview about your procedure. However, you should still follow specific instructions given to you by our office regarding your preparation for the procedure. You have been scheduled for a CT scan of the abdomen and pelvis at [facility] ([address]---this is in the same building as Press photographer).   You are scheduled on 02/23/14 at 230 pm. You should arrive 15 minutes prior to your appointment time for registration. Please follow the written instructions below on the day of your exam:  WARNING: IF YOU ARE ALLERGIC TO IODINE/X-RAY DYE, PLEASE NOTIFY RADIOLOGY IMMEDIATELY AT [phone]! YOU WILL BE GIVEN A 13 HOUR PREMEDICATION PREP.  1) Do not eat or drink anything after 1030 am (4 hours prior to your test) 2) You have been given 2 bottles of oral contrast to drink. The solution may taste  better if refrigerated, but do NOT add ice or any other liquid to this solution. Shake well before drinking.    Drink 1 bottle of contrast @ 1230 pm (2 hours prior to your exam)  Drink 1 bottle of contrast @ 130 pm (1 hour prior to your exam)  You may take any medications as prescribed with a small amount of water except for the following: Metformin, Glucophage, Glucovance, Avandamet, Riomet, Fortamet, Actoplus Met, Janumet, Glumetza or Metaglip. The above medications must be held the day of the exam AND 48 hours after the exam.  The purpose of you drinking the oral contrast is to aid in the visualization of your intestinal tract. The contrast solution may cause  some diarrhea. Before your exam is started, you will be given a small amount of fluid to drink. Depending on your individual set of symptoms, you may also receive an intravenous injection of x-ray contrast/dye. Plan on being at [facility] for 30 minutes or long, depending on the type of exam you are having performed.  This test typically takes 30-45 minutes to complete.  If you have any questions regarding your exam or if you need to reschedule, you may call the CT department at [phone] between the hours of 8:00 am and 5:00 pm, Monday-Friday.  ________________________________________________________________________ Your physician has requested that you go to the basement for the following lab work before leaving today:

## 2014-02-23 ENCOUNTER — Ambulatory Visit (INDEPENDENT_AMBULATORY_CARE_PROVIDER_SITE_OTHER)
Admission: RE | Admit: 2014-02-23 | Discharge: 2014-02-23 | Disposition: A | Discharge status: Home / Self Care | Payer: Medicare Other | Source: Ambulatory Visit | Attending: Gastroenterology | Admitting: Gastroenterology

## 2014-02-23 DIAGNOSIS — R197 Diarrhea, unspecified: Secondary | ICD-10-CM

## 2014-02-23 DIAGNOSIS — R634 Abnormal weight loss: Secondary | ICD-10-CM

## 2014-02-23 LAB — TISSUE TRANSGLUTAMINASE, IGA: Tissue Transglutaminase Ab, IgA: 1 U/mL (ref <4)

## 2014-02-23 MED ORDER — IOHEXOL 300 MG/ML  SOLN
100.0000 mL | Freq: Once | INTRAMUSCULAR | Status: AC | PRN
  Administered 2014-02-23: 100 mL via INTRAVENOUS

## 2014-02-23 NOTE — Progress Notes (Signed)
Agree with Ms. Zehr's management.  Diezel Mazur E. Nataliee Shurtz, MD, FACG  

## 2014-03-06 ENCOUNTER — Telehealth: Payer: Self-pay | Admitting: Internal Medicine

## 2014-03-06 NOTE — Telephone Encounter (Signed)
Pt following up on request. pls advise. °

## 2014-03-06 NOTE — Telephone Encounter (Signed)
granddaughter calling to fu on handicap app request dropped off before christmas.  It expired end of jan. Now they need.   Also, requesting refill of levothyroxine (SYNTHROID, LEVOTHROID) 25 MCG tablet] [facility]

## 2014-03-07 MED ORDER — LEVOTHYROXINE SODIUM 25 MCG PO TABS: 25.0000 ug | ORAL_TABLET | Freq: Every day | ORAL | Status: DC

## 2014-03-07 NOTE — Telephone Encounter (Signed)
I don't recall seeing handicap form but ok to approve and provide pt with permanent placard.

## 2014-03-07 NOTE — Telephone Encounter (Signed)
Have you seen this form this handicap form?  If not, will you approve it?  I don't have it

## 2014-03-08 ENCOUNTER — Other Ambulatory Visit: Payer: Self-pay | Admitting: Internal Medicine

## 2014-03-08 NOTE — Telephone Encounter (Signed)
Form up front for p/u, pt's daughter aware

## 2014-03-09 NOTE — Telephone Encounter (Signed)
Ok per Dr.Yoo to refill x 2.  Rx called in to pharmacy.  

## 2014-03-14 ENCOUNTER — Encounter (HOSPITAL_COMMUNITY): Payer: Self-pay | Admitting: *Deleted

## 2014-03-20 ENCOUNTER — Encounter: Payer: Medicare Other | Admitting: Internal Medicine

## 2014-03-20 ENCOUNTER — Telehealth: Payer: Self-pay | Admitting: Internal Medicine

## 2014-03-20 NOTE — Telephone Encounter (Signed)
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  He has been rescheduled with his daughter for 05/01/14 10:30 at [facility]

## 2014-03-20 NOTE — Telephone Encounter (Signed)
Left message for patient to call back  

## 2014-03-20 NOTE — Telephone Encounter (Signed)
See phone note from 03/20/14

## 2014-03-22 ENCOUNTER — Encounter: Payer: Medicare Other | Admitting: Internal Medicine

## 2014-03-27 ENCOUNTER — Other Ambulatory Visit: Payer: Self-pay | Admitting: *Deleted

## 2014-03-27 MED ORDER — FUROSEMIDE 20 MG PO TABS: 20.0000 mg | ORAL_TABLET | Freq: Every day | ORAL | Status: DC

## 2014-03-31 ENCOUNTER — Other Ambulatory Visit: Payer: Self-pay | Admitting: Internal Medicine

## 2014-04-07 ENCOUNTER — Encounter: Payer: Self-pay | Admitting: Interventional Cardiology

## 2014-04-07 ENCOUNTER — Ambulatory Visit (INDEPENDENT_AMBULATORY_CARE_PROVIDER_SITE_OTHER): Payer: Medicare Other | Admitting: Interventional Cardiology

## 2014-04-07 VITALS — BP 152/84 | HR 86 | Ht 71.5 in | Wt 186.8 lb

## 2014-04-07 DIAGNOSIS — I519 Heart disease, unspecified: Secondary | ICD-10-CM

## 2014-04-07 DIAGNOSIS — I482 Chronic atrial fibrillation, unspecified: Secondary | ICD-10-CM

## 2014-04-07 DIAGNOSIS — Z950 Presence of cardiac pacemaker: Secondary | ICD-10-CM

## 2014-04-07 DIAGNOSIS — G4733 Obstructive sleep apnea (adult) (pediatric): Secondary | ICD-10-CM

## 2014-04-07 MED ORDER — VALSARTAN 320 MG PO TABS: 320.0000 mg | ORAL_TABLET | Freq: Every day | ORAL | Status: DC

## 2014-04-07 MED ORDER — FUROSEMIDE 20 MG PO TABS: 20.0000 mg | ORAL_TABLET | Freq: Every day | ORAL | Status: DC

## 2014-04-07 NOTE — Progress Notes (Signed)
Cardiology Office Note   Date:  04/07/2014   ID:  Oscar Santiago, DOB 08/01/1925, MRN [id number]  PCP:  Thomos Lemonsobert Yoo, DO  Cardiologist:   Lesleigh NoeSMITH III,HENRY W, MD   No chief complaint on file.     History of Present Illness: Oscar Santiago is a 81 y.o. male who presents for diastolic heart failure. He has atrial fibrillation and a paced rhythm. 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 He denies orthopnea. He denies chest pain. Is no peripheral edema. He has not had syncope. Appetite is been stable.  Past Medical History  Diagnosis Date  . Hypertension   . Seizure disorder   . Glaucoma   . Hyperlipidemia   . Osteopenia   . Compression fracture of spine     T 10 and T11  . Gout   . OSA (obstructive sleep apnea)     on CPAP therapy  . Hypothyroid   . Inguinal hernia   . Hemorrhoid   . Chronic systolic dysfunction of left ventricle     EF 30-35% by echo 10/10/2009  . Major depression   . History of GI bleed     from coumadin  . MR (mitral regurgitation)   . Hearing loss   . Dizziness     intermittent  . Kidney stone   . CKD (chronic kidney disease), stage III   . Permanent atrial fibrillation     refuses coumadin due to history of GI bleed  . Decreased appetite 03/02/12  . Weight loss 03/02/12  . Complete heart block     Past Surgical History  Procedure Laterality Date  . Transurethral resection of prostate    . Cardiac catheterization  2002  . Pacemaker insertion  07/28/06    VVI pacemaker for complete heart block by Dr Amil AmenEdmunds  . Inguinal hernia repair    . Hemorrhoid surgery       Current Outpatient Prescriptions  Medication Sig Dispense Refill  . alendronate (FOSAMAX) 70 MG tablet Take 70 mg by mouth once a week.     Marland Kitchen. amLODipine (NORVASC) 10 MG tablet Take 10 mg by mouth daily as needed (when BP is in upper 80s-usually at bedtime).     Marland Kitchen. aspirin  81 MG tablet Take 81 mg by mouth.    . brimonidine (ALPHAGAN P) 0.1 % SOLN Place 1 drop into both eyes 2 (two) times daily.     Marland Kitchen. CHERRY PO Take 1 capsule by mouth daily.    . colchicine (COLCRYS) 0.6 MG tablet Take 0.6 mg by mouth as needed (for gout).     . Cyanocobalamin (VITAMIN B 12 PO) Take 1,000 mg by mouth daily.    . fluticasone (FLONASE) 50 MCG/ACT nasal spray Place into both nostrils as needed. At night    . furosemide (LASIX) 20 MG tablet Take 1 tablet (20 mg total) by mouth daily. 30 tablet 11  . latanoprost (XALATAN) 0.005 % ophthalmic solution Place 1 drop into both eyes at bedtime.     . levETIRAcetam (KEPPRA) 500 MG tablet Take 250-500 mg by mouth 2 (two) times daily.    Marland Kitchen. levothyroxine (SYNTHROID, LEVOTHROID) 25 MCG tablet Take 1 tablet (25 mcg total) by mouth daily before breakfast. 90 tablet 1  . Multiple Vitamin (MULTIVITAMIN) capsule Take 1 capsule by mouth daily.    . valsartan (DIOVAN) 320 MG  tablet Take 1 tablet (320 mg total) by mouth daily with lunch. 30 tablet 11  . zolpidem (AMBIEN) 5 MG tablet TAKE 1 TABLET AT BEDTIME 30 tablet 2   No current facility-administered medications for this visit.    Allergies:   Coumadin and Lisinopril    Social History:  The patient  reports that he has never smoked. He has never used smokeless tobacco. He reports that he does not drink alcohol or use illicit drugs.   Family History:  The patient's family history includes Aneurysm in his brother; CVA in his sister; Heart failure in his mother. There is no history of Colon cancer or Colon polyps.    ROS:  Please see the history of present illness.   Otherwise, review of systems are positive for decreased memory.   All other systems are reviewed and negative.    PHYSICAL EXAM: VS:  BP 152/84 mmHg  Pulse 86  Ht 5' 11.5" (1.816 m)  Wt 186 lb 12.8 oz (84.732 kg)  BMI 25.69 kg/m2 , BMI Body mass index is 25.69 kg/(m^2).164/90 mmHg GEN: Well nourished, well developed, in no acute  distress HEENT: normal Neck: no JVD, carotid bruits, or masses Cardiac: RRR; 2 to 3/6 systolic murmur at the apex; rubs, or gallops,no edema  Respiratory:  clear to auscultation bilaterally, normal work of breathing GI: soft, nontender, nondistended, + BS MS: no deformity or atrophy Skin: warm and dry, no rash Neuro:  Strength and sensation are intact Psych: euthymic mood, full affect   EKG:  EKG is ordered today. The ekg ordered today demonstrates atrial fibrillation with V pacing.   Recent Labs: 02/21/2014: ALT 17; BUN 21; Creatinine 1.39; Hemoglobin 13.1; Platelets 191.0; Potassium 3.9; Sodium 134*; TSH 2.71    Lipid Panel No results found for: CHOL, TRIG, HDL, CHOLHDL, VLDL, LDLCALC, LDLDIRECT    Wt Readings from Last 3 Encounters:  04/07/14 186 lb 12.8 oz (84.732 kg)  02/21/14 181 lb 4 oz (82.214 kg)  02/10/14 181 lb (82.101 kg)      Other studies Reviewed: Additional studies/ records that were reviewed today include: .   ASSESSMENT AND PLAN:  Chronic atrial fibrillation - controlled rate with ventricular pacing. Not on anticoagulation because a prior history of GI bleeding on Coumadin.  Chronic systolic dysfunction of left ventricle - no recent LV systolic evaluation. Last EF 2000 01/02/1934 percent.   OSA (obstructive sleep apnea)Colon where CPAP  Essential hypertension: Well-controlled. Needs more aggressive diuretic component  Cardiac pacemaker in situ:  normal function    Current medicines are reviewed at length with the patient today.  The patient does not have concerns regarding medicines.  The following changes have been made:  As continued Diovan HCT and start Diovan 320 mg daily and furosemide 20 mg per day. Furosemide may need to be increased to 40 mg depending upon volume status. The loop diuretic may help better control blood pressure given his chronic renal insufficiency. I believe HCT is inadequate at this point given his known stage III chronic  kidney disease.  Labs/ tests ordered today include: None   Orders Placed This Encounter  Procedures  . Basic metabolic panel  . EKG 12-Lead     Disposition:   FU with Mendel Ryder in 12 months   Signed, Lesleigh Noe, MD  04/07/2014 5:17 PM    [facility] [address] [place], [place], [place]  [address] Phone: [phone]; Fax: [phone]

## 2014-04-07 NOTE — Patient Instructions (Signed)
Your physician has recommended you make the following change in your medication:  1) STOP Diovan HCTZ 2) START Diovan 320mg  daily. An Rx has been sent to your pharmacy 3) START Lasix 20mg  daily. An Rx has been sent to your pharmacy  Your physician recommends that you return for lab work in: 2-4 weeks (Bmet)   Your physician wants you to follow-up in: 1 year with Dr.Smith You will receive a reminder letter in the mail two months in advance. If you don't receive a letter, please call our office to schedule the follow-up appointment.

## 2014-04-12 ENCOUNTER — Encounter: Payer: Self-pay | Admitting: Internal Medicine

## 2014-04-12 ENCOUNTER — Ambulatory Visit (INDEPENDENT_AMBULATORY_CARE_PROVIDER_SITE_OTHER): Payer: Medicare Other | Admitting: Internal Medicine

## 2014-04-12 VITALS — BP 134/76 | HR 76 | Temp 97.9°F | Ht 70.5 in | Wt 189.0 lb

## 2014-04-12 DIAGNOSIS — Z0000 Encounter for general adult medical examination without abnormal findings: Secondary | ICD-10-CM | POA: Diagnosis not present

## 2014-04-12 DIAGNOSIS — Z515 Encounter for palliative care: Secondary | ICD-10-CM | POA: Insufficient documentation

## 2014-04-12 DIAGNOSIS — Z23 Encounter for immunization: Secondary | ICD-10-CM

## 2014-04-12 NOTE — Progress Notes (Signed)
Pre visit review using our clinic review tool, if applicable. No additional management support is needed unless otherwise documented below in the visit note. 

## 2014-04-12 NOTE — Patient Instructions (Signed)
Please complete the following lab tests before your next follow up appointment: BMET - 401.9 TSH - 244.9 

## 2014-04-12 NOTE — Progress Notes (Signed)
Subjective:    Patient ID: Oscar Santiago, male    DOB: January 26, 1926, 81 y.o.   MRN: [id number]  HPI  81 year old African-American male presents for Medicare wellness exam. Medicare wellness questionnaire reviewed in detail. See attached form. He has not had any hospitalizations or visits to the emergency room within the past year. He has not had any surgeries with the last year. He is followed by [facility] ophthalmology. His last eye exam was 2 months ago. He is treated for glaucoma and he wears corrective lenses. Physician Roster reviewed and updated.  He lives alone but has supportive daughters and great grandson.  He is accompanied by his great grandson today Ethelene Browns)  Screening for substance abuse negative. He exercises on a regular basis. He is able to perform most activities of daily living with the exception of driving. No history of falls in the last year. He denies gait instability. He wears bilateral hearing aids. He denies any symptoms of depression. Screening for memory loss shows mild senility.  Review of Systems     Past Medical History  Diagnosis Date  . Hypertension   . Seizure disorder   . Glaucoma   . Hyperlipidemia   . Osteopenia   . Compression fracture of spine     T 10 and T11  . Gout   . OSA (obstructive sleep apnea)     on CPAP therapy  . Hypothyroid   . Inguinal hernia   . Hemorrhoid   . Chronic systolic dysfunction of left ventricle     EF 30-35% by echo 10/10/2009  . Major depression   . History of GI bleed     from coumadin  . MR (mitral regurgitation)   . Hearing loss   . Dizziness     intermittent  . Kidney stone   . CKD (chronic kidney disease), stage III   . Permanent atrial fibrillation     refuses coumadin due to history of GI bleed  . Decreased appetite 03/02/12  . Weight loss 03/02/12  . Complete heart block     History   Social History  . Marital Status: Widowed    Spouse Name: N/A  . Number of Children: 6  . Years of  Education: N/A   Occupational History  . Retired     Education officer, environmental   Social History Main Topics  . Smoking status: Never Smoker   . Smokeless tobacco: Never Used  . Alcohol Use: No  . Drug Use: No  . Sexual Activity: Not on file   Other Topics Concern  . 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 Transurethral resection of prostate    . Cardiac catheterization  2002  . Pacemaker insertion  07/28/06    VVI pacemaker for complete heart block by Dr Amil Amen  . Inguinal hernia repair    . Hemorrhoid surgery      Family History  Problem Relation Age of Onset  . Heart failure Mother   . CVA Sister   . Aneurysm Brother     brain  . Colon cancer Neg Hx   . Colon polyps Neg Hx     Allergies  Allergen Reactions  . Coumadin [Warfarin Sodium]     GI Bleed   . Lisinopril Cough  Current Outpatient Prescriptions on File Prior to Visit  Medication Sig Dispense Refill  . alendronate (FOSAMAX) 70 MG tablet Take 70 mg by mouth once a week.     Marland Kitchen. amLODipine (NORVASC) 10 MG tablet Take 10 mg by mouth daily as needed (when BP is in upper 80s-usually at bedtime).     Marland Kitchen. aspirin 81 MG tablet Take 81 mg by mouth.    . brimonidine (ALPHAGAN P) 0.1 % SOLN Place 1 drop into both eyes 2 (two) times daily.     Marland Kitchen. CHERRY PO Take 1 capsule by mouth daily.    . colchicine (COLCRYS) 0.6 MG tablet Take 0.6 mg by mouth as needed (for gout).     . Cyanocobalamin (VITAMIN B 12 PO) Take 1,000 mg by mouth daily.    . fluticasone (FLONASE) 50 MCG/ACT nasal spray Place into both nostrils as needed. At night    . furosemide (LASIX) 20 MG tablet Take 1 tablet (20 mg total) by mouth daily. 30 tablet 11  . latanoprost (XALATAN) 0.005 % ophthalmic solution Place 1 drop into both eyes at bedtime.     . levETIRAcetam (KEPPRA) 500 MG tablet Take  250-500 mg by mouth 2 (two) times daily.    Marland Kitchen. levothyroxine (SYNTHROID, LEVOTHROID) 25 MCG tablet Take 1 tablet (25 mcg total) by mouth daily before breakfast. 90 tablet 1  . Multiple Vitamin (MULTIVITAMIN) capsule Take 1 capsule by mouth daily.    . valsartan (DIOVAN) 320 MG tablet Take 1 tablet (320 mg total) by mouth daily with lunch. 30 tablet 11  . zolpidem (AMBIEN) 5 MG tablet TAKE 1 TABLET AT BEDTIME 30 tablet 2   No current facility-administered medications on file prior to visit.    BP 134/76 mmHg  Pulse 76  Temp(Src) 97.9 F (36.6 C) (Oral)  Ht 5' 10.5" (1.791 m)  Wt 189 lb (85.73 kg)  BMI 26.73 kg/m2    Objective:   Physical Exam  Constitutional: He is oriented to person, place, and time. He appears well-developed and well-nourished. No distress.  HENT:  Head: Normocephalic and atraumatic.  Right Ear: External ear normal.  Left Ear: External ear normal.  Mouth/Throat: Oropharynx is clear and moist.  Eyes: Conjunctivae and EOM are normal. Pupils are equal, round, and reactive to light.  Bilateral arcus senilis  Neck: Neck supple.  No carotid bruit  Cardiovascular: Normal rate.   No murmur heard. Rhythm is irregularly irregular  Pulmonary/Chest: Effort normal and breath sounds normal. He has no wheezes.  Abdominal: Soft. Bowel sounds are normal. He exhibits no mass. There is no tenderness.  Musculoskeletal: He exhibits no edema.  Lymphadenopathy:    He has no cervical adenopathy.  Neurological: He is alert and oriented to person, place, and time. No cranial nerve deficit.  Gait is normal. Patient usually ambulates with single pronged cane  Skin: Skin is warm and dry.  Psychiatric: He has a normal mood and affect. His behavior is normal.          Assessment & Plan:

## 2014-04-12 NOTE — Assessment & Plan Note (Addendum)
81 year old American male presents for Medicare wellness exam. Screening for substance abuse negative. Patient able to perform most activities of daily living with exception of driving. His gait is stable. He wears bilateral hearing aids. Screening for depression negative. Screening for memory loss suggests mild senility. Patient updated with Pneumovax.  He has medical power of attorney.  End of life care discussed.  He wants full code status.

## 2014-04-20 ENCOUNTER — Other Ambulatory Visit: Payer: Medicare Other

## 2014-04-27 ENCOUNTER — Encounter (HOSPITAL_COMMUNITY): Payer: Self-pay | Admitting: *Deleted

## 2014-05-01 ENCOUNTER — Ambulatory Visit (HOSPITAL_COMMUNITY)
Admission: RE | Admit: 2014-05-01 | Discharge: 2014-05-01 | Disposition: A | Discharge status: Home / Self Care | Payer: Medicare Other | Source: Ambulatory Visit | Attending: Internal Medicine | Admitting: Internal Medicine

## 2014-05-01 ENCOUNTER — Ambulatory Visit (HOSPITAL_COMMUNITY): Payer: Medicare Other | Admitting: Anesthesiology

## 2014-05-01 ENCOUNTER — Encounter (HOSPITAL_COMMUNITY): Payer: Self-pay | Admitting: Anesthesiology

## 2014-05-01 ENCOUNTER — Encounter (HOSPITAL_COMMUNITY)
Admission: RE | Disposition: A | Discharge status: Home / Self Care | Payer: Self-pay | Source: Ambulatory Visit | Attending: Internal Medicine

## 2014-05-01 DIAGNOSIS — G40909 Epilepsy, unspecified, not intractable, without status epilepticus: Secondary | ICD-10-CM | POA: Insufficient documentation

## 2014-05-01 DIAGNOSIS — E039 Hypothyroidism, unspecified: Secondary | ICD-10-CM | POA: Diagnosis not present

## 2014-05-01 DIAGNOSIS — K7689 Other specified diseases of liver: Secondary | ICD-10-CM | POA: Insufficient documentation

## 2014-05-01 DIAGNOSIS — N200 Calculus of kidney: Secondary | ICD-10-CM | POA: Insufficient documentation

## 2014-05-01 DIAGNOSIS — N210 Calculus in bladder: Secondary | ICD-10-CM | POA: Diagnosis not present

## 2014-05-01 DIAGNOSIS — Z9079 Acquired absence of other genital organ(s): Secondary | ICD-10-CM | POA: Diagnosis not present

## 2014-05-01 DIAGNOSIS — Z7951 Long term (current) use of inhaled steroids: Secondary | ICD-10-CM | POA: Diagnosis not present

## 2014-05-01 DIAGNOSIS — Z79899 Other long term (current) drug therapy: Secondary | ICD-10-CM | POA: Insufficient documentation

## 2014-05-01 DIAGNOSIS — I482 Chronic atrial fibrillation: Secondary | ICD-10-CM | POA: Insufficient documentation

## 2014-05-01 DIAGNOSIS — I129 Hypertensive chronic kidney disease with stage 1 through stage 4 chronic kidney disease, or unspecified chronic kidney disease: Secondary | ICD-10-CM | POA: Diagnosis not present

## 2014-05-01 DIAGNOSIS — M109 Gout, unspecified: Secondary | ICD-10-CM | POA: Insufficient documentation

## 2014-05-01 DIAGNOSIS — Z950 Presence of cardiac pacemaker: Secondary | ICD-10-CM | POA: Insufficient documentation

## 2014-05-01 DIAGNOSIS — H9190 Unspecified hearing loss, unspecified ear: Secondary | ICD-10-CM | POA: Diagnosis not present

## 2014-05-01 DIAGNOSIS — H409 Unspecified glaucoma: Secondary | ICD-10-CM | POA: Insufficient documentation

## 2014-05-01 DIAGNOSIS — Z7982 Long term (current) use of aspirin: Secondary | ICD-10-CM | POA: Insufficient documentation

## 2014-05-01 DIAGNOSIS — G4733 Obstructive sleep apnea (adult) (pediatric): Secondary | ICD-10-CM | POA: Insufficient documentation

## 2014-05-01 DIAGNOSIS — N3289 Other specified disorders of bladder: Secondary | ICD-10-CM | POA: Insufficient documentation

## 2014-05-01 DIAGNOSIS — K5730 Diverticulosis of large intestine without perforation or abscess without bleeding: Secondary | ICD-10-CM | POA: Diagnosis not present

## 2014-05-01 DIAGNOSIS — R197 Diarrhea, unspecified: Secondary | ICD-10-CM | POA: Diagnosis present

## 2014-05-01 DIAGNOSIS — M8580 Other specified disorders of bone density and structure, unspecified site: Secondary | ICD-10-CM | POA: Insufficient documentation

## 2014-05-01 DIAGNOSIS — N183 Chronic kidney disease, stage 3 (moderate): Secondary | ICD-10-CM | POA: Insufficient documentation

## 2014-05-01 DIAGNOSIS — R634 Abnormal weight loss: Secondary | ICD-10-CM

## 2014-05-01 HISTORY — PX: COLONOSCOPY WITH PROPOFOL: SHX5780

## 2014-05-01 LAB — GLUCOSE, CAPILLARY
Glucose-Capillary: 66 mg/dL — ABNORMAL LOW (ref 70–99)
Glucose-Capillary: 49 mg/dL — ABNORMAL LOW (ref 70–99)
Glucose-Capillary: 80 mg/dL (ref 70–99)

## 2014-05-01 SURGERY — COLONOSCOPY WITH PROPOFOL
Anesthesia: Monitor Anesthesia Care

## 2014-05-01 MED ORDER — DEXTROSE 50 % IV SOLN
INTRAVENOUS | Status: AC
  Filled 2014-05-01: qty 50

## 2014-05-01 MED ORDER — DEXTROSE 50 % IV SOLN
INTRAVENOUS | Status: DC | PRN
  Administered 2014-05-01: 12.5 g via INTRAVENOUS
  Administered 2014-05-01: 6 g via INTRAVENOUS

## 2014-05-01 MED ORDER — SODIUM CHLORIDE 0.9 % IV SOLN
INTRAVENOUS | Status: DC
Start: 2014-05-01 — End: 2014-05-01
  Administered 2014-05-01: 10:00:00 via INTRAVENOUS

## 2014-05-01 MED ORDER — LIDOCAINE HCL 1 % IJ SOLN
INTRAMUSCULAR | Status: DC | PRN
  Administered 2014-05-01: 50 mg via INTRADERMAL

## 2014-05-01 MED ORDER — PROPOFOL INFUSION 10 MG/ML OPTIME
INTRAVENOUS | Status: DC | PRN
  Administered 2014-05-01: 180 ug/kg/min via INTRAVENOUS

## 2014-05-01 SURGICAL SUPPLY — 22 items

## 2014-05-01 NOTE — Anesthesia Postprocedure Evaluation (Signed)
  Anesthesia Post-op Note  Patient: Oscar Santiago  Procedure(s) Performed: Procedure(s) (LRB): COLONOSCOPY WITH PROPOFOL (N/A)  Patient Location: PACU  Anesthesia Type: MAC  Level of Consciousness: awake and alert   Airway and Oxygen Therapy: Patient Spontanous Breathing  Post-op Pain: mild  Post-op Assessment: Post-op Vital signs reviewed, Patient's Cardiovascular Status Stable, Respiratory Function Stable, Patent Airway and No signs of Nausea or vomiting  Last Vitals:  Filed Vitals:   05/01/14 1112  BP: 110/45  Pulse: 64  Temp: 36.4 C  Resp: 17    Post-op Vital Signs: stable   Complications: No apparent anesthesia complications

## 2014-05-01 NOTE — Anesthesia Preprocedure Evaluation (Addendum)
Anesthesia Evaluation  Patient identified by MRN, date of birth, ID band Patient awake    Reviewed: Allergy & Precautions, NPO status , Patient's Chart, lab work & pertinent test results  Airway Mallampati: II  TM Distance: >3 FB Neck ROM: Full    Dental no notable dental hx.    Pulmonary sleep apnea and Continuous Positive Airway Pressure Ventilation ,  breath sounds clear to auscultation  Pulmonary exam normal       Cardiovascular hypertension, Pt. on medications + pacemaker Rhythm:Regular Rate:Normal     Neuro/Psych negative neurological ROS  negative psych ROS   GI/Hepatic negative GI ROS, Neg liver ROS,   Endo/Other  negative endocrine ROS  Renal/GU Renal disease  negative genitourinary   Musculoskeletal negative musculoskeletal ROS (+)   Abdominal   Peds negative pediatric ROS (+)  Hematology negative hematology ROS (+)   Anesthesia Other Findings   Reproductive/Obstetrics negative OB ROS                            Anesthesia Physical Anesthesia Plan  ASA: III  Anesthesia Plan: MAC   Post-op Pain Management:    Induction:   Airway Management Planned: Simple Face Mask  Additional Equipment:   Intra-op Plan:   Post-operative Plan:   Informed Consent: I have reviewed the patients History and Physical, chart, labs and discussed the procedure including the risks, benefits and alternatives for the proposed anesthesia with the patient or authorized representative who has indicated his/her understanding and acceptance.   Dental advisory given  Plan Discussed with: CRNA  Anesthesia Plan Comments:         Anesthesia Quick Evaluation

## 2014-05-01 NOTE — Discharge Instructions (Signed)
° °  I did not find anything bad on the colonoscopy. You do have some diverticulosis - not really a problem for you. I took biopsies of the colon lining and we will call with results.  I appreciate the opportunity to care for you. Iva Booparl E. Terryon Pineiro, MD, FACG  YOU HAD AN ENDOSCOPIC PROCEDURE TODAY: Refer to the procedure report and other information in the discharge instructions given to you for any specific questions about what was found during the examination. If this information does not answer your questions, please call Dr. Marvell FullerGessner's office at [phone] to clarify.   YOU SHOULD EXPECT: Some feelings of bloating in the abdomen. Passage of more gas than usual. Walking can help get rid of the air that was put into your GI tract during the procedure and reduce the bloating. If you had a lower endoscopy (such as a colonoscopy or flexible sigmoidoscopy) you may notice spotting of blood in your stool or on the toilet paper. Some abdominal soreness may be present for a day or two, also.  DIET: Your first meal following the procedure should be a light meal and then it is ok to progress to your normal diet. A half-sandwich or bowl of soup is an example of a good first meal. Heavy or fried foods are harder to digest and may make you feel nauseous or bloated. Drink plenty of fluids but you should avoid alcoholic beverages for 24 hours.   ACTIVITY: Your care partner should take you home directly after the procedure. You should plan to take it easy, moving slowly for the rest of the day. You can resume normal activity the day after the procedure however YOU SHOULD NOT DRIVE, use power tools, machinery or perform tasks that involve climbing or major physical exertion for 24 hours (because of the sedation medicines used during the test).   SYMPTOMS TO REPORT IMMEDIATELY: A gastroenterologist can be reached at any hour. Please call [phone]  for any of the following symptoms:  Following lower endoscopy  (colonoscopy, flexible sigmoidoscopy) Excessive amounts of blood in the stool  Significant tenderness, worsening of abdominal pains  Swelling of the abdomen that is new, acute  Fever of 100 or higher    FOLLOW UP:  If any biopsies were taken you will be contacted by phone or by letter within the next 1-3 weeks. Call [phone]  if you have not heard about the biopsies in 3 weeks.  Please also call with any specific questions about appointments or follow up tests.

## 2014-05-01 NOTE — Transfer of Care (Signed)
Immediate Anesthesia Transfer of Care Note  Patient: Oscar Santiago  Procedure(s) Performed: Procedure(s): COLONOSCOPY WITH PROPOFOL (N/A)  Patient Location: PACU and Endoscopy Unit  Anesthesia Type:MAC  Level of Consciousness: awake, oriented and patient cooperative  Airway & Oxygen Therapy: Patient Spontanous Breathing and Patient connected to face mask oxygen  Post-op Assessment: Report given to RN and Post -op Vital signs reviewed and stable  Post vital signs: Reviewed and stable  Last Vitals:  Filed Vitals:   05/01/14 1112  BP: 110/45  Pulse: 64  Temp: 36.4 C  Resp: 17    Complications: No apparent anesthesia complications

## 2014-05-01 NOTE — H&P (Signed)
[facility] History and Physical   Primary Care Physician:  [name], DO   Reason for Procedure:   Evaluate chronic loose stools and diarrhea with some weight loss  Plan:    Colonoscopy - The risks and benefits as well as alternatives of endoscopic procedure(s) have been discussed and reviewed. All questions answered. The patient agrees to proceed.   HPI: Oscar Santiago is a 81 y.o. male seen in our office in Jan 2016. He has had loose stools x months and some mild weight loss. Also c/o borborygmi. CT scan abd/pelvis 02/23/2014 showed:  1. Ovoid lesion within the bladder measuring 3 cm is increased minimally in size from 2010. Differential would include bladder neoplasm, low-density calculus, or atypical a prostate hypertrophy. Recommend urologic consultation potential cystoscopy. 2. Nodular prostate gland extends into the base of the bladder. Recommend correlation as above. 3. There is a nonobstructing calculus in the distal right vesicoureteral junction along the bladder side of the junction. 4. Bilateral nonobstructing renal calculi. 5. Mild nodularity of the liver could represent with cirrhosis.   TTG Ab negative with elevated IgA.   Past Medical History  Diagnosis Date  . Hypertension   . Seizure disorder   . Glaucoma   . Hyperlipidemia   . Osteopenia   . Compression fracture of spine     T 10 and T11  . Gout   . OSA (obstructive sleep apnea)     on CPAP therapy  . Hypothyroid   . Inguinal hernia   . Hemorrhoid   . Chronic systolic dysfunction of left ventricle     EF 30-35% by echo 10/10/2009  . Major depression   . History of GI bleed     from coumadin  . MR (mitral regurgitation)   . Hearing loss   . Dizziness     intermittent  . Kidney stone   . CKD (chronic kidney disease), stage III   . Permanent atrial fibrillation     refuses coumadin due to history of GI bleed  . Decreased appetite 03/02/12  . Weight loss 03/02/12  . Complete heart  block     Past Surgical History  Procedure Laterality Date  . Transurethral resection of prostate    . Cardiac catheterization  2002  . Pacemaker insertion  07/28/06    VVI pacemaker for complete heart block by Dr Amil Amen  . Inguinal hernia repair    . Hemorrhoid surgery      Prior to Admission medications   Medication Sig Start Date End Date Taking? Authorizing Provider  alendronate (FOSAMAX) 70 MG tablet Take 70 mg by mouth once a week.  02/05/14  Yes Historical Provider, MD  amLODipine (NORVASC) 10 MG tablet Take 10 mg by mouth daily as needed (when BP is in upper 80s-usually at bedtime).    Yes Historical Provider, MD  aspirin 81 MG tablet Take 81 mg by mouth.   Yes Historical Provider, MD  brimonidine (ALPHAGAN P) 0.1 % SOLN Place 1 drop into both eyes 2 (two) times daily.    Yes Historical Provider, MD  CHERRY PO Take 1 capsule by mouth daily.   Yes Historical Provider, MD  colchicine (COLCRYS) 0.6 MG tablet Take 0.6 mg by mouth as needed (for gout).    Yes Historical Provider, MD  Cyanocobalamin (VITAMIN B 12 PO) Take 1,000 mg by mouth daily.   Yes Historical Provider, MD  fluticasone (FLONASE) 50 MCG/ACT nasal spray Place into both nostrils as needed. At night  Yes Historical Provider, MD  furosemide (LASIX) 20 MG tablet Take 1 tablet (20 mg total) by mouth daily. 04/07/14  Yes Lyn RecordsHenry W Smith, MD  latanoprost (XALATAN) 0.005 % ophthalmic solution Place 1 drop into both eyes at bedtime.  02/05/14  Yes Historical Provider, MD  levETIRAcetam (KEPPRA) 500 MG tablet Take 250-500 mg by mouth 2 (two) times daily.   Yes Historical Provider, MD  levothyroxine (SYNTHROID, LEVOTHROID) 25 MCG tablet Take 1 tablet (25 mcg total) by mouth daily before breakfast. 03/07/14  Yes Doe-Hyun Sherran Needs Yoo, DO  Multiple Vitamin (MULTIVITAMIN) capsule Take 1 capsule by mouth daily.   Yes Historical Provider, MD  valsartan (DIOVAN) 320 MG tablet Take 1 tablet (320 mg total) by mouth daily with lunch. 04/07/14  Yes Lyn RecordsHenry W  Smith, MD  zolpidem (AMBIEN) 5 MG tablet TAKE 1 TABLET AT BEDTIME 03/09/14  Yes Doe-Hyun Sherran Needs Yoo, DO    Current Facility-Administered Medications  Medication Dose Route Frequency Provider Last Rate Last Dose  . 0.9 %  sodium chloride infusion   Intravenous Continuous Jessica D Zehr, PA-C        Allergies as of 02/21/2014 - Review Complete 02/21/2014  Allergen Reaction Noted  . Coumadin [warfarin sodium]  12/24/2012  . Lisinopril Cough 12/24/2012    Family History  Problem Relation Age of Onset  . Heart failure Mother   . CVA Sister   . Aneurysm Brother     brain  . Colon cancer Neg Hx   . Colon polyps Neg Hx     History   Social History  . Marital Status: Widowed    Spouse Name: N/A  . Number of Children: 6  . Years of Education: N/A   Occupational History  . Retired     Education officer, environmentalastor   Social History Main Topics  . Smoking status: Never Smoker   . Smokeless tobacco: Never Used  . Alcohol Use: No  . Drug Use: No   Social History Narrative   Widowed - wife passed 16 years ago   Careers adviserChurch pastor for 30 years.  Recently retired   Lives alone   6 children      Physician roster:   Dr. Katrinka BlazingSmith - Cardiology   Dr. Leone PayorGessner - Gastroenterology   Dr. Brunilda PayorNesi - Urology   [facility]    Review of Systems: Positive for some weakness, eyeglasses All other review of systems negative except as mentioned in the HPI.  Physical Exam: Vital signs in last 24 hours:     General:   Alert,  Well-developed, well-nourished, pleasant and cooperative in NAD Lungs:  Clear throughout to auscultation.   Heart:  Regular rate and rhythm; no murmurs, clicks, rubs,  or gallops. Abdomen:  Soft, nontender and nondistended. Normal bowel sounds.   Neuro/Psych:  Alert and cooperative. Normal mood and affect. A and O x 3   @Derricka Mertz  Sena SlateE. Glyn Gerads, MD, [facility] [phone] (pager) 05/01/2014 10:24 AM@

## 2014-05-01 NOTE — Op Note (Signed)
[facility] [address] [place], [id number]   COLONOSCOPY PROCEDURE REPORT  PATIENT: Oscar Santiago, Hameed N  MR#: [id number] BIRTHDATE: 1925/06/10 , 89  yrs. old GENDER: male ENDOSCOPIST: Iva Booparl E Enes Rokosz, MD, [facility] PROCEDURE DATE:  05/01/2014 PROCEDURE:   Colonoscopy, diagnostic and Colonoscopy with biopsy First Screening Colonoscopy - Avg.  risk and is 50 yrs.  old or older - No.  Prior Negative Screening - Now for repeat screening. N/A  History of Adenoma - Now for follow-up colonoscopy & has been > or = to 3 yrs.  N/A ASA CLASS:   Class III INDICATIONS:Clinically significant diarrhea of unexplained origin and Colorectal Neoplasm Risk Assessment for this procedure is average risk. MEDICATIONS: Monitored anesthesia care and Per Anesthesia  DESCRIPTION OF PROCEDURE:   After the risks benefits and alternatives of the procedure were thoroughly explained, informed consent was obtained.  The digital rectal exam revealed no rectal mass, revealed no prostatic nodules, and revealed an enlarged prostate.   The Pentax Ped Colon [id number]  endoscope was introduced through the anus and advanced to the terminal ileum which was intubated for a short distance. No adverse events experienced. The quality of the prep was adequate (MiraLax was used)  The instrument was then slowly withdrawn as the colon was fully examined.      COLON FINDINGS: The examined terminal ileum appeared to be normal. The colonic mucosa appeared normal throughout the entire examined colon.  Multiple random biopsies were performed using cold forceps. Samples were sent to R/O microscopic colitis.   There was mild diverticulosis noted in the right colon.  Retroflexed views revealed no abnormalities. The time to cecum = 9.2 Withdrawal time = 11.5   The scope was withdrawn and the procedure completed. COMPLICATIONS: There were no immediate complications.  ENDOSCOPIC IMPRESSION: 1.   The examined terminal  ileum appeared to be normal 2.   The colonic mucosa appeared normal throughout the entire examined colon; multiple random biopsies were performed using cold forceps 3.   Mild diverticulosis was noted in the right colon  RECOMMENDATIONS: Await pathology results  eSigned:  Iva Booparl E Karynn Deblasi, MD, [facility] 05/01/2014 11:25 AM   cc: Dr. Thomos Lemonsobert Yoo and the Patient

## 2014-05-02 ENCOUNTER — Encounter (HOSPITAL_COMMUNITY): Payer: Self-pay | Admitting: Internal Medicine

## 2014-05-02 NOTE — Progress Notes (Signed)
Quick Note:  Biopsies are ok No colitis I recommend he try a daily probiotic and also use simethicone (Gas-Ex) with meals F/u GI prn No recall Am ccing his PCP also ______

## 2014-05-15 ENCOUNTER — Ambulatory Visit (INDEPENDENT_AMBULATORY_CARE_PROVIDER_SITE_OTHER): Payer: Medicare Other | Admitting: Internal Medicine

## 2014-05-15 ENCOUNTER — Encounter: Payer: Self-pay | Admitting: Internal Medicine

## 2014-05-15 VITALS — BP 112/76 | HR 62 | Ht 69.0 in | Wt 190.2 lb

## 2014-05-15 DIAGNOSIS — I482 Chronic atrial fibrillation, unspecified: Secondary | ICD-10-CM

## 2014-05-15 DIAGNOSIS — I442 Atrioventricular block, complete: Secondary | ICD-10-CM

## 2014-05-15 DIAGNOSIS — I519 Heart disease, unspecified: Secondary | ICD-10-CM

## 2014-05-15 LAB — MDC_IDC_ENUM_SESS_TYPE_INCLINIC
Battery Remaining Longevity: 11 mo
Battery Voltage: 2.68 V
Lead Channel Pacing Threshold Amplitude: 0.5 V
Lead Channel Pacing Threshold Pulse Width: 0.4 ms
MDC IDC MSMT BATTERY IMPEDANCE: 4467 Ohm
MDC IDC MSMT LEADCHNL RA IMPEDANCE VALUE: 0 Ohm
MDC IDC MSMT LEADCHNL RV IMPEDANCE VALUE: 437 Ohm
MDC IDC SESS DTM: 20160411113142
MDC IDC SET LEADCHNL RV PACING AMPLITUDE: 2.5 V
MDC IDC SET LEADCHNL RV PACING PULSEWIDTH: 0.4 ms
MDC IDC SET LEADCHNL RV SENSING SENSITIVITY: 5.6 mV
MDC IDC STAT BRADY RV PERCENT PACED: 79 %

## 2014-05-15 NOTE — Progress Notes (Signed)
Electrophysiology Office Note   Date:  05/15/2014   ID:  Oscar Santiago, DOB [birth year], MRN [id number]  PCP:  [name], DO  Cardiologist:  [name] Primary Electrophysiologist: [name], MD    Chief Complaint  Patient presents with  . Atrial Fibrillation    Chronic     History of Present Illness: Oscar Santiago is a 81 y.o. male who presents today for electrophysiology evaluation.   He is doing well.  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  He remains active for his age.  Today, he denies symptoms of palpitations, chest pain, shortness of breath, orthopnea, PND, lower extremity edema, claudication, dizziness, presyncope, syncope, bleeding, or neurologic sequela. The patient is tolerating medications without difficulties and is otherwise without complaint today.    Past Medical History  Diagnosis Date  . Hypertension   . Seizure disorder   . Glaucoma   . Hyperlipidemia   . Osteopenia   . Compression fracture of spine     T 10 and T11  . Gout   . OSA (obstructive sleep apnea)     on CPAP therapy  . Hypothyroid   . Inguinal hernia   . Hemorrhoid   . Chronic systolic dysfunction of left ventricle     EF 30-35% by echo 10/10/2009  . Major depression   . History of GI bleed     from coumadin  . MR (mitral regurgitation)   . Hearing loss   . Dizziness     intermittent  . Kidney stone   . CKD (chronic kidney disease), stage III   . Permanent atrial fibrillation     refuses coumadin due to history of GI bleed  . Decreased appetite 03/02/12  . Weight loss 03/02/12  . Complete heart block    Past Surgical History  Procedure Laterality Date  . Transurethral resection of prostate    . Cardiac catheterization  2002  . Pacemaker insertion  07/28/06    VVI pacemaker for complete heart block by Dr Amil Amen  . Inguinal hernia repair    . Hemorrhoid surgery    . Colonoscopy with propofol N/A 05/01/2014    Procedure: COLONOSCOPY WITH PROPOFOL;  Surgeon: [name], MD;  Location: [facility];  Service: Endoscopy;  Laterality: N/A;     Current Outpatient Prescriptions  Medication Sig Dispense Refill  . alendronate (FOSAMAX) 70 MG tablet Take 70 mg by mouth once a week.     Marland Kitchen amLODipine (NORVASC) 10 MG tablet Take 5 mg by mouth daily as needed (when BP is in upper 80s-usually at bedtime).     Marland Kitchen aspirin 81 MG tablet Take 81 mg by mouth.    . brimonidine (ALPHAGAN P) 0.1 % SOLN Place 1 drop into both eyes 2 (two) times daily.     Marland Kitchen CHERRY PO Take 1 capsule by mouth daily.    . colchicine (COLCRYS) 0.6 MG tablet Take 0.6 mg by mouth daily as needed (for gout).     . Cyanocobalamin (VITAMIN B 12 PO) Take 1,000 mg by mouth daily.    . fluticasone (FLONASE) 50 MCG/ACT nasal spray Place 2 sprays into both nostrils at bedtime as needed for allergies or rhinitis. \    . furosemide (LASIX) 20 MG tablet Take 1 tablet (20 mg total) by mouth daily. 30 tablet 11  . latanoprost (XALATAN) 0.005 % ophthalmic solution Place 1 drop into both eyes at bedtime.     . levETIRAcetam (KEPPRA) 500 MG  tablet Take 250-500 mg by mouth 2 (two) times daily.    Marland Kitchen. levothyroxine (SYNTHROID, LEVOTHROID) 25 MCG tablet Take 1 tablet (25 mcg total) by mouth daily before breakfast. 90 tablet 1  . Multiple Vitamin (MULTIVITAMIN) capsule Take 1 capsule by mouth daily.    . valsartan (DIOVAN) 320 MG tablet Take 1 tablet (320 mg total) by mouth daily with lunch. 30 tablet 11  . zolpidem (AMBIEN) 5 MG tablet Take 2.5 mg by mouth at bedtime as needed for sleep.     No current facility-administered medications for this visit.    Allergies:   Coumadin and Lisinopril   Social History:  The patient  reports that he has never smoked. He has never used smokeless tobacco. He reports that he does not drink alcohol or use illicit drugs.   Family History:  The patient's family history includes Aneurysm in his brother; CVA in his sister; Heart failure in his mother. There is no history of Colon  cancer or Colon polyps.    ROS:  Please see the history of present illness.   All other systems are reviewed and negative.    PHYSICAL EXAM: VS:  BP 112/76 mmHg  Pulse 62  Ht 5\' 9"  (1.753 m)  Wt 190 lb 3.2 oz (86.274 kg)  BMI 28.07 kg/m2 , BMI Body mass index is 28.07 kg/(m^2). GEN: Well nourished, well developed, in no acute distress HEENT: normal Neck: no JVD, carotid bruits, or masses Cardiac: RRR (paced) Respiratory:  clear to auscultation bilaterally, normal work of breathing GI: soft, nontender, nondistended, + BS MS: no deformity or atrophy Skin: warm and dry, device pocket is well healed Neuro:  Strength and sensation are intact Psych: euthymic mood, full affect  Device interrogation is reviewed today in detail.  See PaceArt for details.   Recent Labs: 02/21/2014: ALT 17; BUN 21; Creatinine 1.39; Hemoglobin 13.1; Platelets 191.0; Potassium 3.9; Sodium 134*; TSH 2.71    Lipid Panel  No results found for: CHOL, TRIG, HDL, CHOLHDL, VLDL, LDLCALC, LDLDIRECT   Wt Readings from Last 3 Encounters:  05/15/14 190 lb 3.2 oz (86.274 kg)  04/12/14 189 lb (85.73 kg)  04/07/14 186 lb 12.8 oz (84.732 kg)      Other studies Reviewed: Additional studies/ records that were reviewed today include: Dr Lonn GeorgiaSmiths notes    ASSESSMENT AND PLAN:  1.  Complete heart block Normal pacemaker function See Pace Art report No changes today 11 months estimated battery longevity I stressed the importance of compliance with monthly carelink interrogations with both patient and oldest granddaughter (with him today) My staff has educated then at length and they are enrolled in My Carelink Smart. Once ERI, will anticipate generator change.  Given advanced age, not a candidate for device upgrade  2. Chronic systolic dysfunction euvolemic NYHA Class II  3. Permanent afib Not a candidate for anticoagulation due to GI bleeding  Current medicines are reviewed at length with the patient today.    The patient does not have concerns regarding his medicines.  The following changes were made today:  none  Follow-up: carelink monthly, return to see EP NP in 1 year, follow-up with Dr Katrinka BlazingSmith as scheduled  Signed, Hillis RangeJames Zandria Woldt, MD  05/15/2014 11:27 AM     [facility] [address] [place] [address] [phone] (office) [phone] (fax)

## 2014-05-15 NOTE — Patient Instructions (Signed)
Your physician wants you to follow-up in: 12 months with Dr Allred You will receive a reminder letter in the mail two months in advance. If you don't receive a letter, please call our office to schedule the follow-up appointment.    Remote monitoring is used to monitor your Pacemaker or ICD from home. This monitoring reduces the number of office visits required to check your device to one time per year. It allows us to keep an eye on the functioning of your device to ensure it is working properly. You are scheduled for a device check from home on 06/14/14. You may send your transmission at any time that day. If you have a wireless device, the transmission will be sent automatically. After your physician reviews your transmission, you will receive a postcard with your next transmission date.   

## 2014-06-14 ENCOUNTER — Encounter: Payer: Medicare Other | Admitting: *Deleted

## 2014-06-14 ENCOUNTER — Telehealth: Payer: Self-pay | Admitting: Cardiology

## 2014-06-14 NOTE — Telephone Encounter (Signed)
LMOVM reminding pt to send remote transmission.   

## 2014-06-16 ENCOUNTER — Encounter: Payer: Self-pay | Admitting: Cardiology

## 2014-06-23 ENCOUNTER — Ambulatory Visit (INDEPENDENT_AMBULATORY_CARE_PROVIDER_SITE_OTHER): Payer: Medicare Other | Admitting: *Deleted

## 2014-06-23 DIAGNOSIS — Z950 Presence of cardiac pacemaker: Secondary | ICD-10-CM

## 2014-06-27 NOTE — Progress Notes (Signed)
Remote pacemaker transmission.   

## 2014-06-28 LAB — CUP PACEART REMOTE DEVICE CHECK
Battery Impedance: 4943 Ohm
Battery Remaining Longevity: 8 mo
Brady Statistic RV Percent Paced: 74 %
Lead Channel Setting Pacing Amplitude: 2.5 V
Lead Channel Setting Pacing Pulse Width: 0.4 ms
Lead Channel Setting Sensing Sensitivity: 4 mV
MDC IDC MSMT BATTERY VOLTAGE: 2.66 V
MDC IDC MSMT LEADCHNL RA IMPEDANCE VALUE: 0 Ohm
MDC IDC MSMT LEADCHNL RV IMPEDANCE VALUE: 421 Ohm
MDC IDC SESS DTM: 20160521185128

## 2014-07-12 ENCOUNTER — Encounter: Payer: Self-pay | Admitting: Cardiology

## 2014-07-19 ENCOUNTER — Encounter: Payer: Self-pay | Admitting: Internal Medicine

## 2014-07-22 ENCOUNTER — Other Ambulatory Visit: Payer: Self-pay | Admitting: Internal Medicine

## 2014-07-25 ENCOUNTER — Ambulatory Visit (INDEPENDENT_AMBULATORY_CARE_PROVIDER_SITE_OTHER): Payer: Medicare Other | Admitting: Family Medicine

## 2014-07-25 ENCOUNTER — Encounter: Payer: Self-pay | Admitting: Family Medicine

## 2014-07-25 ENCOUNTER — Ambulatory Visit (INDEPENDENT_AMBULATORY_CARE_PROVIDER_SITE_OTHER)
Admission: RE | Admit: 2014-07-25 | Discharge: 2014-07-25 | Disposition: A | Discharge status: Home / Self Care | Payer: Medicare Other | Source: Ambulatory Visit | Attending: Family Medicine | Admitting: Family Medicine

## 2014-07-25 VITALS — BP 120/70 | HR 70 | Temp 98.1°F | Wt 186.0 lb

## 2014-07-25 DIAGNOSIS — R059 Cough, unspecified: Secondary | ICD-10-CM

## 2014-07-25 DIAGNOSIS — R05 Cough: Secondary | ICD-10-CM

## 2014-07-25 MED ORDER — LEVOFLOXACIN 500 MG PO TABS: 500.0000 mg | ORAL_TABLET | Freq: Every day | ORAL | Status: DC

## 2014-07-25 NOTE — Patient Instructions (Signed)
Follow up for any fever or increased shortness of breath. 

## 2014-07-25 NOTE — Progress Notes (Signed)
Pre visit review using our clinic review tool, if applicable. No additional management support is needed unless otherwise documented below in the visit note. 

## 2014-07-25 NOTE — Progress Notes (Signed)
   Subjective:    Patient ID: Oscar Santiago, male    DOB: 1926-01-19, 81 y.o.   MRN: [id number]  HPI Patient seen with cough of couple days duration. No fever but has had some chills intermittently. He's had some mild nausea this morning but no vomiting. He had significant nasal congestion yesterday and started Flonase and Zyrtec with minimal improvement. He has obstructive sleep apnea and uses CPAP. Nonsmoker.  Past Medical History  Diagnosis Date  . Hypertension   . Seizure disorder   . Glaucoma   . Hyperlipidemia   . Osteopenia   . Compression fracture of spine     T 10 and T11  . Gout   . OSA (obstructive sleep apnea)     on CPAP therapy  . Hypothyroid   . Inguinal hernia   . Hemorrhoid   . Chronic systolic dysfunction of left ventricle     EF 30-35% by echo 10/10/2009  . Major depression   . History of GI bleed     from coumadin  . MR (mitral regurgitation)   . Hearing loss   . Dizziness     intermittent  . Kidney stone   . CKD (chronic kidney disease), stage III   . Permanent atrial fibrillation     refuses coumadin due to history of GI bleed  . Decreased appetite 03/02/12  . Weight loss 03/02/12  . Complete heart block    Past Surgical History  Procedure Laterality Date  . Transurethral resection of prostate    . Cardiac catheterization  2002  . Pacemaker insertion  07/28/06    VVI pacemaker for complete heart block by Dr Amil Amen  . Inguinal hernia repair    . Hemorrhoid surgery    . Colonoscopy with propofol N/A 05/01/2014    Procedure: COLONOSCOPY WITH PROPOFOL;  Surgeon: [name], MD;  Location: [facility];  Service: Endoscopy;  Laterality: N/A;    reports that he has never smoked. He has never used smokeless tobacco. He reports that he does not drink alcohol or use illicit drugs. family history includes Aneurysm in his brother; CVA in his sister; Heart failure in his mother. There is no history of Colon cancer or Colon polyps. Allergies  Allergen  Reactions  . Coumadin [Warfarin Sodium]     GI Bleed   . Lisinopril Cough      Review of Systems  Constitutional: Positive for chills and fatigue. Negative for fever.  HENT: Positive for congestion.   Respiratory: Positive for cough. Negative for shortness of breath and wheezing.   Cardiovascular: Negative for chest pain.       Objective:   Physical Exam  Constitutional: He appears well-developed and well-nourished.  Cardiovascular: Normal rate and regular rhythm.   Pulmonary/Chest: Effort normal.  Rales left base. Not noted on the right. Normal respiratory rate.  No wheezes  Musculoskeletal: He exhibits no edema.          Assessment & Plan:  Cough. Asymmetric breath sounds which could represent atelectasis but with his age need to be careful not to miss pneumonia. Obtain chest x-ray. Start Levaquin 500 mgs once daily for 7 days. Follow-up promptly for increasing fever or increased dyspnea

## 2014-07-31 ENCOUNTER — Telehealth: Payer: Self-pay | Admitting: Cardiology

## 2014-07-31 ENCOUNTER — Encounter: Payer: Medicare Other | Admitting: *Deleted

## 2014-07-31 NOTE — Telephone Encounter (Signed)
Confirmed remote transmission w/ pt daughter.   

## 2014-08-01 ENCOUNTER — Encounter: Payer: Self-pay | Admitting: Cardiology

## 2014-08-22 ENCOUNTER — Ambulatory Visit (INDEPENDENT_AMBULATORY_CARE_PROVIDER_SITE_OTHER): Payer: Medicare Other | Admitting: *Deleted

## 2014-08-22 ENCOUNTER — Encounter: Payer: Self-pay | Admitting: Internal Medicine

## 2014-08-22 DIAGNOSIS — I442 Atrioventricular block, complete: Secondary | ICD-10-CM

## 2014-08-23 NOTE — Progress Notes (Signed)
Remote pacemaker transmission.   

## 2014-08-25 ENCOUNTER — Other Ambulatory Visit: Payer: Self-pay | Admitting: Internal Medicine

## 2014-08-25 ENCOUNTER — Other Ambulatory Visit: Payer: Self-pay | Admitting: Family Medicine

## 2014-09-01 LAB — CUP PACEART REMOTE DEVICE CHECK
Battery Voltage: 2.64 V
Lead Channel Impedance Value: 0 Ohm
Lead Channel Impedance Value: 435 Ohm
Lead Channel Pacing Threshold Amplitude: 0.75 V
Lead Channel Pacing Threshold Pulse Width: 0.4 ms
MDC IDC MSMT BATTERY IMPEDANCE: 5720 Ohm
MDC IDC MSMT BATTERY REMAINING LONGEVITY: 5 mo
MDC IDC MSMT LEADCHNL RV SENSING INTR AMPL: 16 mV
MDC IDC SESS DTM: 20160719150708
MDC IDC SET LEADCHNL RV PACING AMPLITUDE: 2.5 V
MDC IDC SET LEADCHNL RV PACING PULSEWIDTH: 0.4 ms
MDC IDC SET LEADCHNL RV SENSING SENSITIVITY: 5.6 mV
MDC IDC STAT BRADY RV PERCENT PACED: 76 %

## 2014-09-13 ENCOUNTER — Other Ambulatory Visit: Payer: Self-pay | Admitting: Internal Medicine

## 2014-09-18 ENCOUNTER — Encounter: Payer: Self-pay | Admitting: Cardiology

## 2014-09-20 ENCOUNTER — Emergency Department (HOSPITAL_COMMUNITY)
Admission: EM | Admit: 2014-09-20 | Discharge: 2014-09-20 | Disposition: A | Discharge status: Home / Self Care | Payer: Medicare Other | Attending: Emergency Medicine | Admitting: Emergency Medicine

## 2014-09-20 ENCOUNTER — Telehealth: Payer: Self-pay | Admitting: Internal Medicine

## 2014-09-20 ENCOUNTER — Encounter (HOSPITAL_COMMUNITY): Payer: Self-pay | Admitting: *Deleted

## 2014-09-20 ENCOUNTER — Emergency Department (HOSPITAL_COMMUNITY): Payer: Medicare Other

## 2014-09-20 DIAGNOSIS — D696 Thrombocytopenia, unspecified: Secondary | ICD-10-CM

## 2014-09-20 DIAGNOSIS — Z8719 Personal history of other diseases of the digestive system: Secondary | ICD-10-CM | POA: Diagnosis not present

## 2014-09-20 DIAGNOSIS — R2689 Other abnormalities of gait and mobility: Secondary | ICD-10-CM | POA: Diagnosis not present

## 2014-09-20 DIAGNOSIS — Z950 Presence of cardiac pacemaker: Secondary | ICD-10-CM | POA: Diagnosis not present

## 2014-09-20 DIAGNOSIS — G4733 Obstructive sleep apnea (adult) (pediatric): Secondary | ICD-10-CM | POA: Diagnosis not present

## 2014-09-20 DIAGNOSIS — R531 Weakness: Secondary | ICD-10-CM | POA: Diagnosis present

## 2014-09-20 DIAGNOSIS — G40909 Epilepsy, unspecified, not intractable, without status epilepticus: Secondary | ICD-10-CM | POA: Diagnosis not present

## 2014-09-20 DIAGNOSIS — Z7982 Long term (current) use of aspirin: Secondary | ICD-10-CM | POA: Insufficient documentation

## 2014-09-20 DIAGNOSIS — H9190 Unspecified hearing loss, unspecified ear: Secondary | ICD-10-CM | POA: Insufficient documentation

## 2014-09-20 DIAGNOSIS — Z9889 Other specified postprocedural states: Secondary | ICD-10-CM | POA: Diagnosis not present

## 2014-09-20 DIAGNOSIS — E039 Hypothyroidism, unspecified: Secondary | ICD-10-CM | POA: Diagnosis not present

## 2014-09-20 DIAGNOSIS — R569 Unspecified convulsions: Secondary | ICD-10-CM

## 2014-09-20 DIAGNOSIS — Z79899 Other long term (current) drug therapy: Secondary | ICD-10-CM | POA: Insufficient documentation

## 2014-09-20 DIAGNOSIS — Z9981 Dependence on supplemental oxygen: Secondary | ICD-10-CM | POA: Diagnosis not present

## 2014-09-20 DIAGNOSIS — Z87440 Personal history of urinary (tract) infections: Secondary | ICD-10-CM | POA: Insufficient documentation

## 2014-09-20 DIAGNOSIS — R011 Cardiac murmur, unspecified: Secondary | ICD-10-CM | POA: Diagnosis not present

## 2014-09-20 DIAGNOSIS — H409 Unspecified glaucoma: Secondary | ICD-10-CM | POA: Diagnosis not present

## 2014-09-20 DIAGNOSIS — I129 Hypertensive chronic kidney disease with stage 1 through stage 4 chronic kidney disease, or unspecified chronic kidney disease: Secondary | ICD-10-CM | POA: Diagnosis not present

## 2014-09-20 DIAGNOSIS — Z8781 Personal history of (healed) traumatic fracture: Secondary | ICD-10-CM | POA: Diagnosis not present

## 2014-09-20 DIAGNOSIS — M109 Gout, unspecified: Secondary | ICD-10-CM | POA: Insufficient documentation

## 2014-09-20 DIAGNOSIS — N189 Chronic kidney disease, unspecified: Secondary | ICD-10-CM

## 2014-09-20 DIAGNOSIS — N183 Chronic kidney disease, stage 3 (moderate): Secondary | ICD-10-CM | POA: Diagnosis not present

## 2014-09-20 LAB — URINALYSIS, ROUTINE W REFLEX MICROSCOPIC
BILIRUBIN URINE: NEGATIVE
GLUCOSE, UA: NEGATIVE mg/dL
HGB URINE DIPSTICK: NEGATIVE
Ketones, ur: NEGATIVE mg/dL
Leukocytes, UA: NEGATIVE
Nitrite: NEGATIVE
PROTEIN: NEGATIVE mg/dL
Specific Gravity, Urine: 1.009 (ref 1.005–1.030)
Urobilinogen, UA: 1 mg/dL (ref 0.0–1.0)
pH: 6 (ref 5.0–8.0)

## 2014-09-20 LAB — BASIC METABOLIC PANEL
ANION GAP: 7 (ref 5–15)
BUN: 15 mg/dL (ref 6–20)
CO2: 24 mmol/L (ref 22–32)
CREATININE: 1.35 mg/dL — AB (ref 0.61–1.24)
Calcium: 9.1 mg/dL (ref 8.9–10.3)
Chloride: 105 mmol/L (ref 101–111)
GFR calc Af Amer: 53 mL/min — ABNORMAL LOW (ref >60)
GFR, EST NON AFRICAN AMERICAN: 46 mL/min — AB (ref >60)
GLUCOSE: 94 mg/dL (ref 65–99)
Potassium: 3.8 mmol/L (ref 3.5–5.1)
Sodium: 136 mmol/L (ref 135–145)

## 2014-09-20 LAB — CBC
HCT: 37.2 % — ABNORMAL LOW (ref 39.0–52.0)
HEMOGLOBIN: 13.2 g/dL (ref 13.0–17.0)
MCH: 29.6 pg (ref 26.0–34.0)
MCHC: 35.5 g/dL (ref 30.0–36.0)
MCV: 83.4 fL (ref 78.0–100.0)
Platelets: 130 10*3/uL — ABNORMAL LOW (ref 150–400)
RBC: 4.46 MIL/uL (ref 4.22–5.81)
RDW: 15.4 % (ref 11.5–15.5)
WBC: 3.1 10*3/uL — ABNORMAL LOW (ref 4.0–10.5)

## 2014-09-20 MED ORDER — SODIUM CHLORIDE 0.9 % IV SOLN
100.0000 mg | Freq: Once | INTRAVENOUS | Status: AC
  Administered 2014-09-20: 100 mg via INTRAVENOUS
  Filled 2014-09-20: qty 10

## 2014-09-20 MED ORDER — LACOSAMIDE 50 MG PO TABS: 50.0000 mg | ORAL_TABLET | Freq: Two times a day (BID) | ORAL | Status: DC

## 2014-09-20 NOTE — ED Provider Notes (Signed)
CSN: [id number]     Arrival date & time 09/20/14  1121 History   First MD Initiated Contact with Patient 09/20/14 1237     Chief Complaint  Patient presents with  . Weakness     (Consider location/radiation/quality/duration/timing/severity/associated sxs/prior Treatment) HPI Patient with PMH significant for seizures and atrial fibrillation corrected with a pacemaker.  Seizures began approximately 7 years ago with a gran mal seizure and has experienced worsening partial seizures within the last 3-5 years. During these partial seizures family member reports garbled speech and disorientation.  Approximately last night at 11 PM family members report a partial seizure followed by a postictal state.  His normal postictal states last around 15-30 minutes; however, around 5 AM his daughter noticed irregular breathing and a mask-like facial expression.  At 7 AM he appeared lethargic, weak, and unsteady.  He was oriented to person, place, and time.  He sees Neurology at [facility] for his partial seizures for which he takes 250 mg Keppra in the AM and 500 mg Keppra in the PM.  Increased doses in the past lead to lethargy.  He ambulates with a cane at home.  Endorses headache, fatigue, and weakness.  Patient  Past Medical History  Diagnosis Date  . Hypertension   . Seizure disorder   . Glaucoma   . Hyperlipidemia   . Osteopenia   . Compression fracture of spine     T 10 and T11  . Gout   . OSA (obstructive sleep apnea)     on CPAP therapy  . Hypothyroid   . Inguinal hernia   . Hemorrhoid   . Chronic systolic dysfunction of left ventricle     EF 30-35% by echo 10/10/2009  . Major depression   . History of GI bleed     from coumadin  . MR (mitral regurgitation)   . Hearing loss   . Dizziness     intermittent  . Kidney stone   . CKD (chronic kidney disease), stage III   . Permanent atrial fibrillation     refuses coumadin due to history of GI bleed  . Decreased appetite 03/02/12  . Weight loss  03/02/12  . Complete heart block    Past Surgical History  Procedure Laterality Date  . Transurethral resection of prostate    . Cardiac catheterization  2002  . Pacemaker insertion  07/28/06    VVI pacemaker for complete heart block by Dr Amil Amen  . Inguinal hernia repair    . Hemorrhoid surgery    . Colonoscopy with propofol N/A 05/01/2014    Procedure: COLONOSCOPY WITH PROPOFOL;  Surgeon: [name], MD;  Location: [facility];  Service: Endoscopy;  Laterality: N/A;   Family History  Problem Relation Age of Onset  . Heart failure Mother   . CVA Sister   . Aneurysm Brother     brain  . Colon cancer Neg Hx   . Colon polyps Neg Hx    Social History  Substance Use Topics  . Smoking status: Never Smoker   . Smokeless tobacco: Never Used  . Alcohol Use: No    Review of Systems  All other systems reviewed and are negative.     Allergies  Coumadin and Lisinopril  Home Medications   Prior to Admission medications   Medication Sig Start Date End Date Taking? Authorizing Provider  alendronate (FOSAMAX) 70 MG tablet Take 70 mg by mouth once a week.  02/05/14   Historical Provider, MD  amLODipine (NORVASC) 10  MG tablet Take 5 mg by mouth daily as needed (when BP is in upper 80s-usually at bedtime).     Historical Provider, MD  amLODipine (NORVASC) 2.5 MG tablet 1 TABLET ORALLY ONCE A DAY ONCE A DAY ORALLY 90 09/14/14   Doe-Hyun R Artist Pais, DO  aspirin 81 MG tablet Take 81 mg by mouth.    Historical Provider, MD  brimonidine (ALPHAGAN P) 0.1 % SOLN Place 1 drop into both eyes 2 (two) times daily.     Historical Provider, MD  CHERRY PO Take 1 capsule by mouth daily.    Historical Provider, MD  colchicine (COLCRYS) 0.6 MG tablet Take 0.6 mg by mouth daily as needed (for gout).     Historical Provider, MD  Cyanocobalamin (VITAMIN B 12 PO) Take 1,000 mg by mouth daily.    Historical Provider, MD  fluticasone (FLONASE) 50 MCG/ACT nasal spray Place 2 sprays into both nostrils at bedtime  as needed for allergies or rhinitis. \    Historical Provider, MD  furosemide (LASIX) 20 MG tablet Take 1 tablet (20 mg total) by mouth daily. 04/07/14   Lyn Records, MD  latanoprost (XALATAN) 0.005 % ophthalmic solution Place 1 drop into both eyes at bedtime.  02/05/14   Historical Provider, MD  levETIRAcetam (KEPPRA) 500 MG tablet Take 1/2 tablet in the morning and 1 tablet at bedtime. 06/13/14   Historical Provider, MD  levofloxacin (LEVAQUIN) 500 MG tablet Take 1 tablet (500 mg total) by mouth daily. 07/25/14   Kristian Covey, MD  levothyroxine (SYNTHROID, LEVOTHROID) 25 MCG tablet TAKE 1 TABLET (25 MCG TOTAL) BY MOUTH DAILY BEFORE BREAKFAST. 07/24/14   Doe-Hyun Sherran Needs, DO  Multiple Vitamin (MULTIVITAMIN) capsule Take 1 capsule by mouth daily.    Historical Provider, MD  valsartan (DIOVAN) 320 MG tablet Take 1 tablet (320 mg total) by mouth daily with lunch. 04/07/14   Lyn Records, MD  zolpidem (AMBIEN) 5 MG tablet TAKE 1 TABLET AT BEDTIME 08/25/14   Doe-Hyun R Yoo, DO   BP 123/56 mmHg  Pulse 60  Temp(Src) 97.5 F (36.4 C) (Oral)  Resp 16  Ht  (1.753 m)  Wt 187 lb (84.823 kg)  BMI 27.60 kg/m2  SpO2 100% Physical Exam  Constitutional: He is oriented to person, place, and time. He appears well-developed and well-nourished.  HENT:  Head: Normocephalic and atraumatic.  Mouth/Throat: Oropharynx is clear and moist.  Eyes: EOM are normal. Pupils are equal, round, and reactive to light.  Neck: Normal range of motion. Neck supple.  Cardiovascular: Normal rate and regular rhythm.   Murmur heard.  Systolic murmur is present  Pulmonary/Chest: Effort normal and breath sounds normal. No respiratory distress. He has no wheezes. He has no rales.  Abdominal: Soft. Bowel sounds are normal. He exhibits no distension. There is no tenderness.  Musculoskeletal: Normal range of motion.       Right shoulder: He exhibits normal strength.  Lymphadenopathy:    He has no cervical adenopathy.   Neurological: He is alert and oriented to person, place, and time. He has normal strength. No cranial nerve deficit or sensory deficit. He displays a negative Romberg sign. Gait abnormal. GCS eye subscore is 4. GCS verbal subscore is 5. GCS motor subscore is 6.  Slowed gait from patient's baseline with a right sided limp.   Psychiatric: He has a normal mood and affect. His speech is normal and behavior is normal.    ED Course  Procedures (including critical care  time) Labs Review Labs Reviewed  BASIC METABOLIC PANEL - Abnormal; Notable for the following:    Creatinine, Ser 1.35 (*)    GFR calc non Af Amer 46 (*)    GFR calc Af Amer 53 (*)    All other components within normal limits  CBC - Abnormal; Notable for the following:    WBC 3.1 (*)    HCT 37.2 (*)    Platelets 130 (*)    All other components within normal limits  URINALYSIS, ROUTINE W REFLEX MICROSCOPIC (NOT AT [facility])  CBG MONITORING, ED    Imaging Review Ct Head Wo Contrast  09/20/2014   CLINICAL DATA:  Seizures beginning at 11 o'clock last night. Generalized weakness and fatigue.  EXAM: CT HEAD WITHOUT CONTRAST  TECHNIQUE: Contiguous axial images were obtained from the base of the skull through the vertex without intravenous contrast.  COMPARISON:  CT head without contrast 04/22/2009.  FINDINGS: Advanced generalized atrophy is again noted. The ventricles are proportionate to the degree of atrophy. Moderate cerebellar atrophy is present as well.  No acute cortical infarct, hemorrhage, or mass lesion is present. There is no significant extra-axial fluid collection.  The paranasal sinuses and mastoid air cells are clear. The calvarium is intact. No significant extracranial lesion is present. An occipital lipoma is stable.  IMPRESSION: 1. Stable moderate diffuse generalized atrophy. 2. No acute intracranial abnormality.   Electronically Signed   By: Marin Roberts M.D.   On: 09/20/2014 14:02   I have personally reviewed and  evaluated these images and lab results as part of my medical decision-making.   EKG Interpretation   Date/Time:  Wednesday September 20 2014 11:38:41 EDT Ventricular Rate:  78 PR Interval:    QRS Duration: 122 QT Interval:  416 QTC Calculation: 474 R Axis:   -23 Text Interpretation:  Wide QRS rhythm with frequent ventricular-paced  complexes and Premature supraventricular complexes and with occasional  Premature ventricular complexes Anterior infarct , age undetermined ST \\T \  T wave abnormality, consider lateral ischemia Abnormal ECG since last  tracing no significant change Confirmed by MILLER  MD, BRIAN ([id number]) on  09/20/2014 1:08:45 PM      MDM   Final diagnoses:  None   1. Partial Seizures with generalized weakness -7y Hx of partial seizures.  CT negative to r/o stroke. Neurology consulted, awaiting recommendation.  -Seizure medication Keppra 250 mg AM and 500 mg PM  2. Thrombocytopenia -stable, count 130  3. Chronic Renal Failure -BUN/Cr 1.35.  Patients baseline, last values 1.39 and 1.8     [place], [place] 09/20/14 1605  Eber Hong, MD 09/20/14 2258

## 2014-09-20 NOTE — ED Notes (Signed)
Patient returned from being out of the department; pt placed on monitor, continuous pulse oximetry and blood pressure cuff; visitors at bedside

## 2014-09-20 NOTE — Telephone Encounter (Signed)
Patient checked into ED 

## 2014-09-20 NOTE — ED Notes (Signed)
Neuro MD at bedside

## 2014-09-20 NOTE — Telephone Encounter (Signed)
Spoke with granddaughter and she stated grandfather has history of seizure. Presented seizure like activity around 11:00pm last night but fell asleep right after. Woke up with word salad. Patient used CPAP at night and around 5:00 this morning the CPAP machine kept making noise and his sleeping pattern was disturbed. Patient woke up disoriented. Educated patient concerns of hypoxia and needing to go to ED now. Patient's granddaughter agreed to take him. Will follow chart to ensure he goes.

## 2014-09-20 NOTE — Telephone Encounter (Signed)
Patient Name: Oscar Santiago DOB: 1925-03-24 Initial Comment Caller States grandfather early this morning his c-pap was going off crazy. when he got up this morning when he got up he didnt quite know his surroundings. went back to bed and is sleeping again, not quite his normal action Nurse Assessment Nurse: Charna Elizabeth, RN, Lynden Ang Date/Time (Eastern Time): 09/20/2014 9:02:34 AM Confirm and document reason for call. If symptomatic, describe symptoms. ---Caller states Makhari developed confusion last night. No severe breathing difficulty. Has the patient traveled out of the country within the last 30 days? ---No Does the patient require triage? ---Yes Related visit to physician within the last 2 weeks? ---No Does the PT have any chronic conditions? (i.e. diabetes, asthma, etc.) ---Yes List chronic conditions. ---Sleep Apnea, A-Fib, pacemaker, Seizures, Kidney Failure, Bladder problems, High Blood Pressure, Thyroid problems Guidelines Guideline Title Affirmed Question Affirmed Notes Confusion - Delirium Shock suspected (e.g., cold/pale/clammy skin, too weak to stand, low BP, rapid pulse) Final Disposition User Call EMS 911 Now Charna Elizabeth, RN, Lynden Ang Disagree/Comply: Disagree They plan to take him to [facility] ER.

## 2014-09-20 NOTE — ED Notes (Signed)
Pt given an urinal to use; visitors waiting outside of room

## 2014-09-20 NOTE — ED Notes (Signed)
Pt alert and appropriate.  No neuro deficits noted.  SR wide complex with pvc's noted on monitor. Daughter at bedside

## 2014-09-20 NOTE — ED Provider Notes (Signed)
The patient is an 81 year old male, he has had seizures for proximally 8 years, they started as grand mal, they are now a partial seizure which involves his speech, the family member states that he talks in garbled speech, seems to be disconnected with reality, these happen several times a week but over the last 24 hours she has had 3 or 4 of them. He has been generally weak today, on exam the patient has normal strength and sensation of all 4 extremities, normal speech, normal coordination, he does not appear to be in any distress. Abdomen is soft, heart and lungs are normal, there is a soft systolic murmur. There is no peripheral edema, follows commands without difficulty, CT scan pending, labs unremarkable, we'll discuss with neurology. He is on a very small dose of Keppra but this is because he did not tolerate higher doses, will need neurologic recommendation.  Medical screening examination/treatment/procedure(s) were conducted as a shared visit with non-physician practitioner(s) and myself.  I personally evaluated the patient during the encounter.  Clinical Impression:   Final diagnoses:  Seizure  Chronic renal failure, unspecified stage  Thrombocytopenia          Eber Hong, MD 09/20/14 2256

## 2014-09-20 NOTE — Consult Note (Signed)
NEURO HOSPITALIST CONSULT NOTE   Referring physician: Hyacinth Meeker   Reason for Consult: seizure  HPI:                                                                                                                                          Oscar Santiago is an 81 y.o. male who is stated to have been diagnosed with seizures per family.  Looking back to 2012 patient was first seen by [facility] who worked him up for seizure and could not diagnose him with seizure.  He then was referred to [facility] with Dr. Doreatha Martin who evaluated patient and determined "he did not have adult onset epilepsy".  therefore recommended further workup as his condition and his cardiologist discovered that he had a tachycardia-bradycardia syndrome. He underwent pacemaker placement on June 24, and since he had not had any further seizure activity, it was determined that his Keppra would be discontinued. HE was again seen in hospital in 06/2010 for TC activity. HE was restarted on Keppra 250 mg BID.  He could not tolerate 500 mg BID in past. Patient was brought to ED today due to increased frequency of seizures.   She states his typical seizure includes staring and garbled speech followed by a period of somnolence. HE typically would have these episodes once a month but now frequency is 2-3 a week.  She states he had a typical episode last night and then he was placed to bed.  She noted in the middle of the night the "c-pap was going crazy and noted he was breathing heavy.  This subsided and he was peaceful.  Later in the morning she went into room and awoke him.  He awoke fine and was confused why she was still at his house. Once he got up she noted he had weakness in his legs" and brought him to ED.   Of note--he lives alone, family does his cooking and cleaning, he does not do his bill other than 2 which recently he had made mistakes with. HE has been noted to be confused and place objects in the wrong place.  Family states he has been Dx with "mild dementia".   Currently he is on Keppra 250 in AM and 500 in PM. Daughter called his primary neurologist at [facility] who recommended placing him on 500 mg BID. Daughter is not willing to do this due to the lethargy he had when on that dose prior.   Most recent EEG at [facility] 06/19/14---normal    Past Medical History  Diagnosis Date  . Hypertension   . Seizure disorder   . Glaucoma   . Hyperlipidemia   . Osteopenia   . Compression fracture of spine     T 10 and T11  . Gout   .  OSA (obstructive sleep apnea)     on CPAP therapy  . Hypothyroid   . Inguinal hernia   . Hemorrhoid   . Chronic systolic dysfunction of left ventricle     EF 30-35% by echo 10/10/2009  . Major depression   . History of GI bleed     from coumadin  . MR (mitral regurgitation)   . Hearing loss   . Dizziness     intermittent  . Kidney stone   . CKD (chronic kidney disease), stage III   . Permanent atrial fibrillation     refuses coumadin due to history of GI bleed  . Decreased appetite 03/02/12  . Weight loss 03/02/12  . Complete heart block     Past Surgical History  Procedure Laterality Date  . Transurethral resection of prostate    . Cardiac catheterization  2002  . Pacemaker insertion  07/28/06    VVI pacemaker for complete heart block by Dr Amil Amen  . Inguinal hernia repair    . Hemorrhoid surgery    . Colonoscopy with propofol N/A 05/01/2014    Procedure: COLONOSCOPY WITH PROPOFOL;  Surgeon: [name], MD;  Location: [facility];  Service: Endoscopy;  Laterality: N/A;    Family History  Problem Relation Age of Onset  . Heart failure Mother   . CVA Sister   . Aneurysm Brother     brain  . Colon cancer Neg Hx   . Colon polyps Neg Hx      Social History:  reports that he has never smoked. He has never used smokeless tobacco. He reports that he does not drink alcohol or use illicit drugs.  Allergies  Allergen Reactions  . Coumadin [Warfarin Sodium]  Other (See Comments)    GI Bleed   . Lisinopril Cough    MEDICATIONS:                                                                                                                     Current Facility-Administered Medications  Medication Dose Route Frequency Provider Last Rate Last Dose  . lacosamide (VIMPAT) 100 mg in sodium chloride 0.9 % 25 mL IVPB  100 mg Intravenous Once Thana Farr, MD   100 mg at 09/20/14 1709   Current Outpatient Prescriptions  Medication Sig Dispense Refill  . alendronate (FOSAMAX) 70 MG tablet Take 70 mg by mouth every Monday.     Marland Kitchen amLODipine (NORVASC) 10 MG tablet Take 5 mg by mouth daily as needed (when BP is in upper 80s-usually at bedtime).     Marland Kitchen aspirin 81 MG tablet Take 81 mg by mouth daily.     . cetirizine (ZYRTEC) 10 MG tablet Take 10 mg by mouth daily.    Marland Kitchen CHERRY PO Take 1 capsule by mouth daily.    . colchicine (COLCRYS) 0.6 MG tablet Take 0.6 mg by mouth daily as needed (for gout).     . Cyanocobalamin (VITAMIN B 12 PO) Take 1,000 mg by mouth daily.    Marland Kitchen  Dextromethorphan-Guaifenesin (CORICIDIN HBP CONGESTION/COUGH) 10-200 MG CAPS Take 1 capsule by mouth daily as needed (for cold).     . dorzolamide-timolol (COSOPT) 22.3-6.8 MG/ML ophthalmic solution Place 1 drop into both eyes 2 (two) times daily.  6  . famotidine (PEPCID AC) 10 MG chewable tablet Chew 10 mg by mouth daily as needed for heartburn.     . fluticasone (FLONASE) 50 MCG/ACT nasal spray Place 2 sprays into both nostrils daily as needed for allergies or rhinitis. \    . furosemide (LASIX) 20 MG tablet Take 1 tablet (20 mg total) by mouth daily. 30 tablet 11  . latanoprost (XALATAN) 0.005 % ophthalmic solution Place 1 drop into both eyes at bedtime.     . levETIRAcetam (KEPPRA) 500 MG tablet Take 1/2 tablet in the morning and 1 tablet at bedtime.  5  . levothyroxine (SYNTHROID, LEVOTHROID) 25 MCG tablet TAKE 1 TABLET (25 MCG TOTAL) BY MOUTH DAILY BEFORE BREAKFAST. 90 tablet 1  .  Multiple Vitamin (MULTIVITAMIN) capsule Take 1 capsule by mouth daily.    . valsartan (DIOVAN) 320 MG tablet Take 1 tablet (320 mg total) by mouth daily with lunch. 30 tablet 11  . zolpidem (AMBIEN) 5 MG tablet TAKE 1 TABLET AT BEDTIME 30 tablet 2  . amLODipine (NORVASC) 2.5 MG tablet 1 TABLET ORALLY ONCE A DAY ONCE A DAY ORALLY 90 90 tablet 3  . levofloxacin (LEVAQUIN) 500 MG tablet Take 1 tablet (500 mg total) by mouth daily. 7 tablet 0      ROS:                                                                                                                                       History obtained from the patient and and family  General ROS: negative for - chills, fatigue, fever, night sweats, weight gain or weight loss Psychological ROS: negative for - behavioral disorder, hallucinations, memory difficulties, mood swings or suicidal ideation Ophthalmic ROS: negative for - blurry vision, double vision, eye pain or loss of vision ENT ROS: negative for - epistaxis, nasal discharge, oral lesions, sore throat, tinnitus or vertigo Allergy and Immunology ROS: negative for - hives or itchy/watery eyes Hematological and Lymphatic ROS: negative for - bleeding problems, bruising or swollen lymph nodes Endocrine ROS: negative for - galactorrhea, hair pattern changes, polydipsia/polyuria or temperature intolerance Respiratory ROS: negative for - cough, hemoptysis, shortness of breath or wheezing Cardiovascular ROS: negative for - chest pain, dyspnea on exertion, edema or irregular heartbeat Gastrointestinal ROS: negative for - abdominal pain, diarrhea, hematemesis, nausea/vomiting or stool incontinence Genito-Urinary ROS: negative for - dysuria, hematuria, incontinence or urinary frequency/urgency Musculoskeletal ROS: negative for - joint swelling or muscular weakness Neurological ROS: as noted in HPI Dermatological ROS: negative for rash and skin lesion changes   Blood pressure 163/87, pulse 60,  temperature 97.5 F (36.4 C), temperature source Oral, resp. rate 17, height 5'  9" (1.753 m), weight 84.823 kg (187 lb), SpO2 99 %.   Neurologic Examination:                                                                                                      HEENT-  Normocephalic, no lesions, without obvious abnormality.  Normal external eye and conjunctiva.  Normal TM's bilaterally.  Normal auditory canals and external ears. Normal external nose, mucus membranes and septum.  Normal pharynx. Cardiovascular- S1, S2 normal, pulses palpable throughout   Lungs- chest clear, no wheezing, rales, normal symmetric air entry Abdomen- normal findings: bowel sounds normal Extremities- no edema Lymph-no adenopathy palpable Musculoskeletal-no joint tenderness, deformity or swelling Skin-warm and dry, no hyperpigmentation, vitiligo, or suspicious lesions  Neurological Examination Mental Status: Alert, oriented, thought content appropriate.  Speech fluent without evidence of aphasia.  Able to follow 3 step commands without difficulty. Cranial Nerves: II: Discs flat bilaterally; Visual fields grossly normal, pupils equal, round, reactive to light and accommodation III,IV, VI: ptosis not present, extra-ocular motions intact bilaterally V,VII: smile symmetric, facial light touch sensation normal bilaterally VIII: hearing normal bilaterally IX,X: uvula rises symmetrically XI: bilateral shoulder shrug XII: midline tongue extension Motor: Right : Upper extremity   5/5    Left:     Upper extremity   5/5  Lower extremity   5/5     Lower extremity   5/5 Tone and bulk:normal tone throughout; no atrophy noted Sensory: Pinprick and light touch intact throughout, bilaterally Deep Tendon Reflexes: 1+ and symmetric throughout Plantars: Up going bilaterally Cerebellar: normal finger-to-nose normal heel-to-shin test Gait: not tested due to patient safety      Lab Results: Basic Metabolic Panel:  Recent  Labs Lab 09/20/14 1215  NA 136  K 3.8  CL 105  CO2 24  GLUCOSE 94  BUN 15  CREATININE 1.35*  CALCIUM 9.1    Liver Function Tests: No results for input(s): AST, ALT, ALKPHOS, BILITOT, PROT, ALBUMIN in the last 168 hours. No results for input(s): LIPASE, AMYLASE in the last 168 hours. No results for input(s): AMMONIA in the last 168 hours.  CBC:  Recent Labs Lab 09/20/14 1215  WBC 3.1*  HGB 13.2  HCT 37.2*  MCV 83.4  PLT 130*    Cardiac Enzymes: No results for input(s): CKTOTAL, CKMB, CKMBINDEX, TROPONINI in the last 168 hours.  Lipid Panel: No results for input(s): CHOL, TRIG, HDL, CHOLHDL, VLDL, LDLCALC in the last 168 hours.  CBG: No results for input(s): GLUCAP in the last 168 hours.  Microbiology: Results for orders placed or performed in visit on 08/12/07  Smear     Status: None   Collection Time: 09/09/07  4:23 PM  Result Value Ref Range Status   Smear Result Smear Available  Final    Coagulation Studies: No results for input(s): LABPROT, INR in the last 72 hours.  Imaging: Ct Head Wo Contrast  09/20/2014   CLINICAL DATA:  Seizures beginning at 11 o'clock last night. Generalized weakness and fatigue.  EXAM: CT HEAD WITHOUT CONTRAST  TECHNIQUE: Contiguous axial images were obtained  from the base of the skull through the vertex without intravenous contrast.  COMPARISON:  CT head without contrast 04/22/2009.  FINDINGS: Advanced generalized atrophy is again noted. The ventricles are proportionate to the degree of atrophy. Moderate cerebellar atrophy is present as well.  No acute cortical infarct, hemorrhage, or mass lesion is present. There is no significant extra-axial fluid collection.  The paranasal sinuses and mastoid air cells are clear. The calvarium is intact. No significant extracranial lesion is present. An occipital lipoma is stable.  IMPRESSION: 1. Stable moderate diffuse generalized atrophy. 2. No acute intracranial abnormality.   Electronically Signed    By: Marin Roberts M.D.   On: 09/20/2014 14:02     Felicie Morn PA-C [facility] [phone]  09/20/2014, 5:15 PM  Patient seen and examined.  Clinical course and management discussed.  Necessary edits performed.  I agree with the above.  Assessment and plan of care developed and discussed below.     Assessment/Plan: 81 year old male with a history of seizures.  Has had an increasing frequency of seizures over the past few years.  Presents today due to seizure activity.  Has had side effects on higher doses of Keppra in the past.  Outpatient neurologist has recommended increase in Keppra which the family does not agree with.  Although I would prefer patient to remain on monotherapy and be changed to another anticonvulsant today the family is concerned that the patient may have another grand mal seizure.  We will therefore use two anticonvulsants and have the patient contact their outpatient neurologist for further care.    Recommendations: 1.  Vimpat 100mg  IV now 2.  Continue Keppra at home dose 3.  Maintenance Vimpat to be continued at 50mg  BID 4.  Patient to call outpatient neurologist in the morning about medication changes.     Thana Farr, MD [facility] [phone]  09/20/2014  5:18 PM

## 2014-09-20 NOTE — Discharge Instructions (Signed)
Return here as needed.  Follow-up with your neurologist °

## 2014-09-20 NOTE — ED Notes (Signed)
Per pts family pt has "patrial seizures". Family states that he began having these last night at 11pm. Pt has had several episodes throughout the morning. Pt alert and oriented at triage. Pt reports generalized weakness and increased tiredness in triage. No neuro deficits at this time.

## 2014-09-20 NOTE — ED Notes (Signed)
Theodoro Grist, [place] (Neurology) speaking with patient and visitors at bedside at this time

## 2014-09-20 NOTE — ED Notes (Signed)
The patient was up for discharge and had his IV removed. Second IV placed due to order for IV meds prior to DC .

## 2014-09-20 NOTE — ED Notes (Signed)
Patient transported to CT 

## 2014-09-25 ENCOUNTER — Telehealth: Payer: Self-pay | Admitting: Cardiology

## 2014-09-25 ENCOUNTER — Encounter: Payer: Medicare Other | Admitting: *Deleted

## 2014-09-25 NOTE — Telephone Encounter (Signed)
Confirmed remote transmission w/ pt wife.   

## 2014-09-26 ENCOUNTER — Encounter: Payer: Self-pay | Admitting: Cardiology

## 2014-10-02 ENCOUNTER — Ambulatory Visit (INDEPENDENT_AMBULATORY_CARE_PROVIDER_SITE_OTHER): Payer: Medicare Other | Admitting: *Deleted

## 2014-10-02 ENCOUNTER — Encounter: Payer: Self-pay | Admitting: Internal Medicine

## 2014-10-02 DIAGNOSIS — Z950 Presence of cardiac pacemaker: Secondary | ICD-10-CM

## 2014-10-03 NOTE — Progress Notes (Signed)
Remote pacemaker transmission.   

## 2014-10-04 ENCOUNTER — Other Ambulatory Visit: Payer: Self-pay | Admitting: Internal Medicine

## 2014-10-04 LAB — CUP PACEART REMOTE DEVICE CHECK
Battery Voltage: 2.58 V
Brady Statistic RV Percent Paced: 76 %
Date Time Interrogation Session: 20160829215523
Lead Channel Impedance Value: 0 Ohm
Lead Channel Impedance Value: 437 Ohm
Lead Channel Setting Pacing Amplitude: 2.5 V
Lead Channel Setting Pacing Pulse Width: 0.4 ms
MDC IDC MSMT BATTERY IMPEDANCE: 6385 Ohm
MDC IDC MSMT BATTERY REMAINING LONGEVITY: 3 mo
MDC IDC SET LEADCHNL RV SENSING SENSITIVITY: 5.6 mV

## 2014-10-06 ENCOUNTER — Encounter: Payer: Self-pay | Admitting: Cardiology

## 2014-10-13 ENCOUNTER — Ambulatory Visit: Payer: Medicare Other | Admitting: Internal Medicine

## 2014-10-13 ENCOUNTER — Ambulatory Visit (INDEPENDENT_AMBULATORY_CARE_PROVIDER_SITE_OTHER): Payer: Medicare Other | Admitting: Family Medicine

## 2014-10-13 ENCOUNTER — Encounter: Payer: Self-pay | Admitting: Family Medicine

## 2014-10-13 VITALS — BP 120/80 | HR 81 | Temp 98.1°F | Wt 187.0 lb

## 2014-10-13 DIAGNOSIS — G40909 Epilepsy, unspecified, not intractable, without status epilepticus: Secondary | ICD-10-CM | POA: Diagnosis not present

## 2014-10-13 DIAGNOSIS — N289 Disorder of kidney and ureter, unspecified: Secondary | ICD-10-CM | POA: Diagnosis not present

## 2014-10-13 DIAGNOSIS — I10 Essential (primary) hypertension: Secondary | ICD-10-CM

## 2014-10-13 NOTE — Progress Notes (Signed)
Subjective:    Patient ID: Oscar Santiago, male    DOB: 14-Feb-1925, 81 y.o.   MRN: [id number]  HPI   patient seen for routine medical follow-up in the absence of his primary provider who is out. He has history of atrial fibrillation, kidney stones, BPH, seizure disorder. He is followed by multiple specialists. Seizures reported about 3 weeks ago. They made changes in medication but his daughter went back to his Keppra which he has taken previously. He's had no seizure since then. Lab work during that visit was unremarkable. He has some chronic kidney disease with baseline creatinine 1.3. He's had some mild cough recently but no fever. Medications reviewed. Compliant with all. Hypertension treated with amlodipine and Diovan. No falls. No dizziness. No chest pains.  Past Medical History  Diagnosis Date  . Hypertension   . Seizure disorder   . Glaucoma   . Hyperlipidemia   . Osteopenia   . Compression fracture of spine     T 10 and T11  . Gout   . OSA (obstructive sleep apnea)     on CPAP therapy  . Hypothyroid   . Inguinal hernia   . Hemorrhoid   . Chronic systolic dysfunction of left ventricle     EF 30-35% by echo 10/10/2009  . Major depression   . History of GI bleed     from coumadin  . MR (mitral regurgitation)   . Hearing loss   . Dizziness     intermittent  . Kidney stone   . CKD (chronic kidney disease), stage III   . Permanent atrial fibrillation     refuses coumadin due to history of GI bleed  . Decreased appetite 03/02/12  . Weight loss 03/02/12  . Complete heart block    Past Surgical History  Procedure Laterality Date  . Transurethral resection of prostate    . Cardiac catheterization  2002  . Pacemaker insertion  07/28/06    VVI pacemaker for complete heart block by Dr Amil Amen  . Inguinal hernia repair    . Hemorrhoid surgery    . Colonoscopy with propofol N/A 05/01/2014    Procedure: COLONOSCOPY WITH PROPOFOL;  Surgeon: [name], MD;  Location: [facility]  END[facility];  Service: Endoscopy;  Laterality: N/A;    reports that he has never smoked. He has never used smokeless tobacco. He reports that he does not drink alcohol or use illicit drugs. family history includes Aneurysm in his brother; CVA in his sister; Heart failure in his mother. There is no history of Colon cancer or Colon polyps. Allergies  Allergen Reactions  . Coumadin [Warfarin Sodium] Other (See Comments)    GI Bleed   . Lisinopril Cough     Review of Systems  Constitutional: Negative for fatigue.  Eyes: Negative for visual disturbance.  Respiratory: Negative for cough, chest tightness and shortness of breath.   Cardiovascular: Negative for chest pain, palpitations and leg swelling.  Endocrine: Negative for polydipsia and polyuria.  Neurological: Negative for dizziness, syncope, weakness, light-headedness and headaches.       Objective:   Physical Exam  Constitutional: He is oriented to person, place, and time. He appears well-developed and well-nourished.  HENT:  Right Ear: External ear normal.  Left Ear: External ear normal.  Mouth/Throat: Oropharynx is clear and moist.  Eyes: Pupils are equal, round, and reactive to light.  Neck: Neck supple. No thyromegaly present.  Cardiovascular: Normal rate.  Exam reveals no gallop.   Pulmonary/Chest: Effort normal  and breath sounds normal. No respiratory distress. He has no wheezes. He has no rales.  Musculoskeletal: He exhibits no edema.  Neurological: He is alert and oriented to person, place, and time.          Assessment & Plan:  #1 hypertension stable and at goal. Continue current medications #2 seizure disorder. Recent seizure 3 weeks ago. Encouraged continue close follow-up with neurology  #3 health maintenance. Flu vaccine recommended. They wish to wait until October  #4 chronic kidney disease. Recent creatinine stable by labs in ER.

## 2014-10-13 NOTE — Progress Notes (Signed)
Pre visit review using our clinic review tool, if applicable. No additional management support is needed unless otherwise documented below in the visit note. 

## 2014-10-13 NOTE — Patient Instructions (Signed)
Remember to get flu vaccine by October.

## 2014-10-20 ENCOUNTER — Observation Stay (HOSPITAL_COMMUNITY): Payer: Medicare Other

## 2014-10-20 ENCOUNTER — Inpatient Hospital Stay (HOSPITAL_COMMUNITY)
Admission: EM | Admit: 2014-10-20 | Discharge: 2014-10-28 | DRG: 038 | Disposition: A | Discharge status: Home / Self Care | Payer: Medicare Other | Attending: Internal Medicine | Admitting: Internal Medicine

## 2014-10-20 ENCOUNTER — Emergency Department (HOSPITAL_COMMUNITY): Payer: Medicare Other

## 2014-10-20 ENCOUNTER — Encounter (HOSPITAL_COMMUNITY): Payer: Self-pay | Admitting: Emergency Medicine

## 2014-10-20 DIAGNOSIS — I482 Chronic atrial fibrillation, unspecified: Secondary | ICD-10-CM | POA: Diagnosis present

## 2014-10-20 DIAGNOSIS — R338 Other retention of urine: Secondary | ICD-10-CM | POA: Diagnosis present

## 2014-10-20 DIAGNOSIS — M4854XA Collapsed vertebra, not elsewhere classified, thoracic region, initial encounter for fracture: Secondary | ICD-10-CM | POA: Diagnosis present

## 2014-10-20 DIAGNOSIS — H409 Unspecified glaucoma: Secondary | ICD-10-CM | POA: Diagnosis present

## 2014-10-20 DIAGNOSIS — Z79899 Other long term (current) drug therapy: Secondary | ICD-10-CM

## 2014-10-20 DIAGNOSIS — E785 Hyperlipidemia, unspecified: Secondary | ICD-10-CM | POA: Diagnosis present

## 2014-10-20 DIAGNOSIS — R531 Weakness: Secondary | ICD-10-CM

## 2014-10-20 DIAGNOSIS — I10 Essential (primary) hypertension: Secondary | ICD-10-CM | POA: Diagnosis present

## 2014-10-20 DIAGNOSIS — D72819 Decreased white blood cell count, unspecified: Secondary | ICD-10-CM | POA: Diagnosis present

## 2014-10-20 DIAGNOSIS — I639 Cerebral infarction, unspecified: Secondary | ICD-10-CM

## 2014-10-20 DIAGNOSIS — E039 Hypothyroidism, unspecified: Secondary | ICD-10-CM | POA: Diagnosis present

## 2014-10-20 DIAGNOSIS — I5022 Chronic systolic (congestive) heart failure: Secondary | ICD-10-CM | POA: Diagnosis present

## 2014-10-20 DIAGNOSIS — G4733 Obstructive sleep apnea (adult) (pediatric): Secondary | ICD-10-CM | POA: Diagnosis present

## 2014-10-20 DIAGNOSIS — I6523 Occlusion and stenosis of bilateral carotid arteries: Secondary | ICD-10-CM | POA: Diagnosis not present

## 2014-10-20 DIAGNOSIS — I348 Other nonrheumatic mitral valve disorders: Secondary | ICD-10-CM | POA: Diagnosis present

## 2014-10-20 DIAGNOSIS — Z888 Allergy status to other drugs, medicaments and biological substances status: Secondary | ICD-10-CM

## 2014-10-20 DIAGNOSIS — D696 Thrombocytopenia, unspecified: Secondary | ICD-10-CM | POA: Diagnosis present

## 2014-10-20 DIAGNOSIS — G8194 Hemiplegia, unspecified affecting left nondominant side: Secondary | ICD-10-CM | POA: Diagnosis present

## 2014-10-20 DIAGNOSIS — G40909 Epilepsy, unspecified, not intractable, without status epilepticus: Secondary | ICD-10-CM | POA: Diagnosis present

## 2014-10-20 DIAGNOSIS — G459 Transient cerebral ischemic attack, unspecified: Secondary | ICD-10-CM | POA: Diagnosis not present

## 2014-10-20 DIAGNOSIS — R319 Hematuria, unspecified: Secondary | ICD-10-CM | POA: Diagnosis not present

## 2014-10-20 DIAGNOSIS — H9190 Unspecified hearing loss, unspecified ear: Secondary | ICD-10-CM | POA: Diagnosis present

## 2014-10-20 DIAGNOSIS — Z7982 Long term (current) use of aspirin: Secondary | ICD-10-CM

## 2014-10-20 DIAGNOSIS — Z950 Presence of cardiac pacemaker: Secondary | ICD-10-CM | POA: Diagnosis present

## 2014-10-20 DIAGNOSIS — R310 Gross hematuria: Secondary | ICD-10-CM | POA: Diagnosis not present

## 2014-10-20 DIAGNOSIS — R471 Dysarthria and anarthria: Secondary | ICD-10-CM | POA: Diagnosis present

## 2014-10-20 DIAGNOSIS — Z7983 Long term (current) use of bisphosphonates: Secondary | ICD-10-CM

## 2014-10-20 DIAGNOSIS — Y92009 Unspecified place in unspecified non-institutional (private) residence as the place of occurrence of the external cause: Secondary | ICD-10-CM

## 2014-10-20 DIAGNOSIS — I129 Hypertensive chronic kidney disease with stage 1 through stage 4 chronic kidney disease, or unspecified chronic kidney disease: Secondary | ICD-10-CM | POA: Diagnosis present

## 2014-10-20 DIAGNOSIS — N183 Chronic kidney disease, stage 3 unspecified: Secondary | ICD-10-CM | POA: Diagnosis present

## 2014-10-20 DIAGNOSIS — W1830XA Fall on same level, unspecified, initial encounter: Secondary | ICD-10-CM | POA: Diagnosis present

## 2014-10-20 DIAGNOSIS — M109 Gout, unspecified: Secondary | ICD-10-CM | POA: Diagnosis present

## 2014-10-20 DIAGNOSIS — R4781 Slurred speech: Secondary | ICD-10-CM

## 2014-10-20 DIAGNOSIS — N401 Enlarged prostate with lower urinary tract symptoms: Secondary | ICD-10-CM | POA: Diagnosis present

## 2014-10-20 LAB — CBC
HCT: 38.7 % — ABNORMAL LOW (ref 39.0–52.0)
Hemoglobin: 13.3 g/dL (ref 13.0–17.0)
MCH: 28.9 pg (ref 26.0–34.0)
MCHC: 34.4 g/dL (ref 30.0–36.0)
MCV: 84.1 fL (ref 78.0–100.0)
PLATELETS: 124 10*3/uL — AB (ref 150–400)
RBC: 4.6 MIL/uL (ref 4.22–5.81)
RDW: 15.6 % — AB (ref 11.5–15.5)
WBC: 2.7 10*3/uL — AB (ref 4.0–10.5)

## 2014-10-20 LAB — RETICULOCYTES
RBC.: 4.64 MIL/uL (ref 4.22–5.81)
RETIC CT PCT: 0.6 % (ref 0.4–3.1)
Retic Count, Absolute: 27.8 10*3/uL (ref 19.0–186.0)

## 2014-10-20 LAB — I-STAT CHEM 8, ED
BUN: 17 mg/dL (ref 6–20)
CREATININE: 1.4 mg/dL — AB (ref 0.61–1.24)
Calcium, Ion: 1.12 mmol/L — ABNORMAL LOW (ref 1.13–1.30)
Chloride: 103 mmol/L (ref 101–111)
GLUCOSE: 93 mg/dL (ref 65–99)
HEMATOCRIT: 44 % (ref 39.0–52.0)
HEMOGLOBIN: 15 g/dL (ref 13.0–17.0)
Potassium: 3.9 mmol/L (ref 3.5–5.1)
Sodium: 138 mmol/L (ref 135–145)
TCO2: 20 mmol/L (ref 0–100)

## 2014-10-20 LAB — COMPREHENSIVE METABOLIC PANEL
ALBUMIN: 3.7 g/dL (ref 3.5–5.0)
ALK PHOS: 56 U/L (ref 38–126)
ALT: 21 U/L (ref 17–63)
ANION GAP: 8 (ref 5–15)
AST: 37 U/L (ref 15–41)
BUN: 15 mg/dL (ref 6–20)
CALCIUM: 8.9 mg/dL (ref 8.9–10.3)
CO2: 21 mmol/L — AB (ref 22–32)
Chloride: 106 mmol/L (ref 101–111)
Creatinine, Ser: 1.33 mg/dL — ABNORMAL HIGH (ref 0.61–1.24)
GFR calc non Af Amer: 46 mL/min — ABNORMAL LOW (ref >60)
GFR, EST AFRICAN AMERICAN: 54 mL/min — AB (ref >60)
GLUCOSE: 94 mg/dL (ref 65–99)
POTASSIUM: 4 mmol/L (ref 3.5–5.1)
SODIUM: 135 mmol/L (ref 135–145)
TOTAL PROTEIN: 6.8 g/dL (ref 6.5–8.1)
Total Bilirubin: 0.8 mg/dL (ref 0.3–1.2)

## 2014-10-20 LAB — CBG MONITORING, ED: GLUCOSE-CAPILLARY: 91 mg/dL (ref 65–99)

## 2014-10-20 LAB — SAVE SMEAR

## 2014-10-20 LAB — DIFFERENTIAL
BASOS ABS: 0 10*3/uL (ref 0.0–0.1)
Basophils Relative: 1 %
Eosinophils Absolute: 0.2 10*3/uL (ref 0.0–0.7)
Eosinophils Relative: 9 %
LYMPHS ABS: 1.2 10*3/uL (ref 0.7–4.0)
Lymphocytes Relative: 43 %
MONO ABS: 0.3 10*3/uL (ref 0.1–1.0)
MONOS PCT: 10 %
NEUTROS ABS: 1 10*3/uL — AB (ref 1.7–7.7)
NEUTROS PCT: 37 %

## 2014-10-20 LAB — PATHOLOGIST SMEAR REVIEW

## 2014-10-20 LAB — PROTIME-INR
INR: 1.23 (ref 0.00–1.49)
PROTHROMBIN TIME: 15.6 s — AB (ref 11.6–15.2)

## 2014-10-20 LAB — IRON AND TIBC
IRON: 65 ug/dL (ref 45–182)
SATURATION RATIOS: 22 % (ref 17.9–39.5)
TIBC: 301 ug/dL (ref 250–450)
UIBC: 236 ug/dL

## 2014-10-20 LAB — VITAMIN B12: Vitamin B-12: 2477 pg/mL — ABNORMAL HIGH (ref 180–914)

## 2014-10-20 LAB — FERRITIN: FERRITIN: 66 ng/mL (ref 24–336)

## 2014-10-20 LAB — APTT: APTT: 34 s (ref 24–37)

## 2014-10-20 LAB — I-STAT TROPONIN, ED: Troponin i, poc: 0.02 ng/mL (ref 0.00–0.08)

## 2014-10-20 LAB — FOLATE: Folate: 34.1 ng/mL (ref >5.9)

## 2014-10-20 MED ORDER — FLUTICASONE PROPIONATE 50 MCG/ACT NA SUSP
2.0000 | Freq: Every day | NASAL | Status: DC | PRN
  Administered 2014-10-25: 2 via NASAL
  Filled 2014-10-20 (×2): qty 16

## 2014-10-20 MED ORDER — ADULT MULTIVITAMIN W/MINERALS CH
1.0000 | ORAL_TABLET | Freq: Every day | ORAL | Status: DC
  Administered 2014-10-20 – 2014-10-28 (×8): 1 via ORAL
  Filled 2014-10-20 (×8): qty 1

## 2014-10-20 MED ORDER — ENOXAPARIN SODIUM 40 MG/0.4ML ~~LOC~~ SOLN
40.0000 mg | Freq: Every day | SUBCUTANEOUS | Status: DC
  Administered 2014-10-20 – 2014-10-22 (×3): 40 mg via SUBCUTANEOUS
  Filled 2014-10-20 (×3): qty 0.4

## 2014-10-20 MED ORDER — ASPIRIN 325 MG PO TABS
325.0000 mg | ORAL_TABLET | Freq: Every day | ORAL | Status: DC
  Administered 2014-10-20 – 2014-10-28 (×8): 325 mg via ORAL
  Filled 2014-10-20 (×8): qty 1

## 2014-10-20 MED ORDER — IRBESARTAN 75 MG PO TABS
37.5000 mg | ORAL_TABLET | Freq: Every day | ORAL | Status: DC
  Administered 2014-10-21 – 2014-10-28 (×6): 37.5 mg via ORAL
  Filled 2014-10-20 (×9): qty 0.5

## 2014-10-20 MED ORDER — FUROSEMIDE 20 MG PO TABS
20.0000 mg | ORAL_TABLET | Freq: Every day | ORAL | Status: DC
  Administered 2014-10-20 – 2014-10-28 (×8): 20 mg via ORAL
  Filled 2014-10-20 (×8): qty 1

## 2014-10-20 MED ORDER — LEVETIRACETAM 250 MG PO TABS
250.0000 mg | ORAL_TABLET | Freq: Every morning | ORAL | Status: DC
  Administered 2014-10-21 – 2014-10-28 (×8): 250 mg via ORAL
  Filled 2014-10-20 (×8): qty 1

## 2014-10-20 MED ORDER — STROKE: EARLY STAGES OF RECOVERY BOOK
Freq: Once | Status: AC
  Administered 2014-10-20: 12:00:00

## 2014-10-20 MED ORDER — IOHEXOL 350 MG/ML SOLN
50.0000 mL | Freq: Once | INTRAVENOUS | Status: AC | PRN
  Administered 2014-10-20: 50 mL via INTRAVENOUS

## 2014-10-20 MED ORDER — LATANOPROST 0.005 % OP SOLN
1.0000 [drp] | Freq: Every day | OPHTHALMIC | Status: DC
  Administered 2014-10-20 – 2014-10-28 (×9): 1 [drp] via OPHTHALMIC
  Filled 2014-10-20 (×3): qty 2.5

## 2014-10-20 MED ORDER — LEVOTHYROXINE SODIUM 25 MCG PO TABS
25.0000 ug | ORAL_TABLET | Freq: Every day | ORAL | Status: DC
  Administered 2014-10-21 – 2014-10-28 (×7): 25 ug via ORAL
  Filled 2014-10-20 (×8): qty 1

## 2014-10-20 MED ORDER — LORATADINE 10 MG PO TABS
10.0000 mg | ORAL_TABLET | Freq: Every day | ORAL | Status: DC
  Administered 2014-10-20 – 2014-10-28 (×8): 10 mg via ORAL
  Filled 2014-10-20 (×8): qty 1

## 2014-10-20 MED ORDER — DORZOLAMIDE HCL-TIMOLOL MAL 2-0.5 % OP SOLN
1.0000 [drp] | Freq: Two times a day (BID) | OPHTHALMIC | Status: DC
  Administered 2014-10-20 – 2014-10-28 (×15): 1 [drp] via OPHTHALMIC
  Filled 2014-10-20 (×3): qty 10

## 2014-10-20 MED ORDER — ASPIRIN 300 MG RE SUPP
300.0000 mg | Freq: Every day | RECTAL | Status: DC
  Filled 2014-10-20: qty 1

## 2014-10-20 MED ORDER — AMLODIPINE BESYLATE 5 MG PO TABS
5.0000 mg | ORAL_TABLET | Freq: Every day | ORAL | Status: DC | PRN
  Filled 2014-10-20: qty 1

## 2014-10-20 MED ORDER — LEVETIRACETAM 500 MG PO TABS
500.0000 mg | ORAL_TABLET | Freq: Every day | ORAL | Status: DC
  Administered 2014-10-20 – 2014-10-28 (×9): 500 mg via ORAL
  Filled 2014-10-20 (×10): qty 1

## 2014-10-20 MED ORDER — LEVETIRACETAM 250 MG PO TABS: 250.0000 mg | ORAL_TABLET | Freq: Two times a day (BID) | ORAL | Status: DC

## 2014-10-20 MED ORDER — ZOLPIDEM TARTRATE 5 MG PO TABS
2.5000 mg | ORAL_TABLET | Freq: Every day | ORAL | Status: DC
  Administered 2014-10-20 – 2014-10-27 (×7): 2.5 mg via ORAL
  Filled 2014-10-20 (×7): qty 1

## 2014-10-20 NOTE — Code Documentation (Signed)
81yo male arriving to [facility] via [facility] at [phone].  EMS was called for a fall.  On the scene patient reported that he developed left sided weakness while getting ready and fell.  Patient with h/o seizures and pacemaker on ASA.  EMS assessed left facial droop and left hemiplegia and activated a Code Stroke.  Stroke team at the bedside on patient arrival.  Labs drawn and patient cleared by Dr. Fredderick Phenix.  Patient to CT.  Initial NIHSS 5, see documentation for details and code stroke times.  Patient reports waking up at his baseline at 0700 and then noticing difficulty buttoning his shirt.  Patient with improving exam.  Dr. Roseanne Reno at the bedside.  No treatment with tPA at this time.  Patient to remain in the tPA window until 1130.   Family updated on plan of care by Dr. Roseanne Reno.  Bedside handoff with ED RN Hope.

## 2014-10-20 NOTE — H&P (Signed)
[facility] History and Physical                                                                                    Oscar Santiago, is a 81 y.o. male  MRN: [id number]   DOB - 14-Jan-1926  Admit Date - 10/20/2014  Outpatient Primary MD for the patient is [name], DO  Referring MD: Fredderick Phenix / ER  Consulting M.D: Roseanne Reno / Neurology  With History of -  Past Medical History  Diagnosis Date  . Hypertension   . Seizure disorder   . Glaucoma   . Hyperlipidemia   . Osteopenia   . Compression fracture of spine     T 10 and T11  . Gout   . OSA (obstructive sleep apnea)     on CPAP therapy  . Hypothyroid   . Inguinal hernia   . Hemorrhoid   . Chronic systolic dysfunction of left ventricle     EF 30-35% by echo 10/10/2009  . Major depression   . History of GI bleed     from coumadin  . MR (mitral regurgitation)   . Hearing loss   . Dizziness     intermittent  . Kidney stone   . CKD (chronic kidney disease), stage III   . Permanent atrial fibrillation     refuses coumadin due to history of GI bleed  . Decreased appetite 03/02/12  . Weight loss 03/02/12  . Complete heart block       Past Surgical History  Procedure Laterality Date  . Transurethral resection of prostate    . Cardiac catheterization  2002  . Pacemaker insertion  07/28/06    VVI pacemaker for complete heart block by Dr Amil Amen  . Inguinal hernia repair    . Hemorrhoid surgery    . Colonoscopy with propofol N/A 05/01/2014    Procedure: COLONOSCOPY WITH PROPOFOL;  Surgeon: [name], MD;  Location: [facility];  Service: Endoscopy;  Laterality: N/A;    in for   Chief Complaint  Patient presents with  . Code Stroke     HPI This is an 81 year old male patient past medical history of hypertension, seizure disorder, dyslipidemia, sleep apnea, gout, hypothyroidism, chronic kidney disease stage III, history of complete heart block status post pacemaker, history of atrial fibrillation not on  anticoagulation secondary to patient refusal to take Coumadin due to it being "rat poison" and prior history of GI bleeding with attempts to do so in the past. Patient was sent to the ER via EMS after he fell this morning. Prior to the fall patient had developed left-sided weakness. Upon EMS arrival to the home patient was found to have left facial drooping and left hemiplegia and code stroke was activated at the scene.  Upon arrival to the hospital the stroke team/Dr. Roseanne Reno was at the bedside. Initial NIHSS was 5. Upon further questioning patient reported awakening at 7 am and was at his baseline but later noticed he was having difficulty buttoning his shirt while drerssing. Patient's symptoms began to rapidly improve after arrival therefore tPA was not initiated. Initial vital signs were stable and blood pressure was well controlled at  143/75, room air saturations were in percent. Laboratory data unremarkable except for mild leukopenia which is chronic and mild thrombocytopenia which appears to be new compared with previous laboratory data from 2011. Patient did have a CBC in Jan. 2016 with white count 3500 but platelets were normal at 191,000. Patient has not been anemic.   Review of Systems   In addition to the HPI above,  No Fever-chills, myalgias or other constitutional symptoms No Headache, changes with Vision or hearing No problems swallowing food or Liquids, indigestion/reflux No Chest pain, Cough or Shortness of Breath, palpitations, orthopnea or DOE No Abdominal pain, N/V; no melena or hematochezia, no dark tarry stools, Bowel movements are regular, No dysuria, hematuria or flank pain No new skin rashes, lesions, masses or bruises, No new joints pains-aches No recent weight gain or loss No polyuria, polydypsia or polyphagia,  *A full 10 point Review of Systems was done, except as stated above, all other Review of Systems were negative.  Social History Social History  Substance Use  Topics  . Smoking status: Never Smoker   . Smokeless tobacco: Never Used  . Alcohol Use: No    Resides at: Private residence  Lives with:  Alone  Ambulatory status:  Without assistive devices  Employment history: Retired TEFL teacher   Family History Family History  Problem Relation Age of Onset  . Heart failure Mother   . TIA Brother, mother    . Aneurysm Brother     brain  . Colon cancer Neg Hx   . Colon polyps Neg Hx      Prior to Admission medications   Medication Sig Start Date End Date Taking? Authorizing Provider  alendronate (FOSAMAX) 70 MG tablet Take 70 mg by mouth every Monday.  02/05/14   Historical Provider, MD  amLODipine (NORVASC) 10 MG tablet Take 5 mg by mouth daily as needed (when BP is in upper 80s-usually at bedtime).     Historical Provider, MD  aspirin 81 MG tablet Take 81 mg by mouth daily.     Historical Provider, MD  cetirizine (ZYRTEC) 10 MG tablet Take 10 mg by mouth daily.    Historical Provider, MD  CHERRY PO Take 1 capsule by mouth daily.    Historical Provider, MD  colchicine 0.6 MG tablet TAKE 1 TABLET TWICE DAILY Patient taking differently: TAKE 1 TABLET TWICE DAILY as needed 10/05/14   Doe-Hyun R Artist Pais, DO  Cyanocobalamin (VITAMIN B 12 PO) Take 1,000 mg by mouth daily.    Historical Provider, MD  Dextromethorphan-Guaifenesin (CORICIDIN HBP CONGESTION/COUGH) 10-200 MG CAPS Take 1 capsule by mouth daily as needed (for cold).     Historical Provider, MD  dorzolamide-timolol (COSOPT) 22.3-6.8 MG/ML ophthalmic solution Place 1 drop into both eyes 2 (two) times daily. 09/09/14   Historical Provider, MD  famotidine (PEPCID AC) 10 MG chewable tablet Chew 10 mg by mouth daily as needed for heartburn.  07/18/04   Historical Provider, MD  fluticasone (FLONASE) 50 MCG/ACT nasal spray Place 2 sprays into both nostrils daily as needed for allergies or rhinitis. \    Historical Provider, MD  furosemide (LASIX) 20 MG tablet Take 1 tablet (20 mg total) by  mouth daily. 04/07/14   Lyn Records, MD  lacosamide (VIMPAT) 50 MG TABS tablet Take 1 tablet (50 mg total) by mouth 2 (two) times daily. 09/20/14   Christopher Lawyer, PA-C  latanoprost (XALATAN) 0.005 % ophthalmic solution Place 1 drop into both eyes at bedtime.  02/05/14  Historical Provider, MD  levETIRAcetam (KEPPRA) 500 MG tablet Take 1/2 tablet in the morning and 1 tablet at bedtime. 06/13/14   Historical Provider, MD  levothyroxine (SYNTHROID, LEVOTHROID) 25 MCG tablet TAKE 1 TABLET (25 MCG TOTAL) BY MOUTH DAILY BEFORE BREAKFAST. 07/24/14   Doe-Hyun Sherran Needs, DO  Multiple Vitamin (MULTIVITAMIN) capsule Take 1 capsule by mouth daily.    Historical Provider, MD  valsartan (DIOVAN) 320 MG tablet Take 1 tablet (320 mg total) by mouth daily with lunch. 04/07/14   Lyn Records, MD  zolpidem (AMBIEN) 5 MG tablet TAKE 1 TABLET AT BEDTIME 08/25/14   Doe-Hyun Sherran Needs, DO    Allergies  Allergen Reactions  . Coumadin [Warfarin Sodium] Other (See Comments)    GI Bleed   . Lisinopril Cough    Physical Exam  Vitals  Blood pressure 143/75, pulse 58, resp. rate 21, SpO2 100 %.   General:  In no acute distress, appears stated age,healthy and well nourished  Psych:  Normal affect, Denies Suicidal or Homicidal ideations, Awake Alert, Oriented X 3. Speech and thought patterns are clear and appropriate, subtle short term memory deficits-very talkative  Neuro:   No focal neurological deficits, CN II through XII intact, Strength 5/5 all 4 extremities, Sensation intact all 4 extremities.  ENT:  Ears and Eyes appear Normal, Conjunctivae clear, PER. Moist oral mucosa without erythema or exudates.  Neck:  Supple, No lymphadenopathy appreciated  Respiratory:  Symmetrical chest wall movement, Good air movement bilaterally, CTAB. Room Air  Cardiac:  RRR, No Murmurs, no LE edema noted, no JVD, No carotid bruits, peripheral pulses palpable at 2+  Abdomen:  Positive bowel sounds, Soft, Non tender, Non distended,   No masses appreciated, no obvious hepatosplenomegaly  Skin:  No Cyanosis, Normal Skin Turgor, No Skin Rash or Bruise.  Extremities: Symmetrical without obvious trauma or injury,  no effusions.  Data Review  CBC  Recent Labs Lab 10/20/14 0903 10/20/14 0909  WBC 2.7*  --   HGB 13.3 15.0  HCT 38.7* 44.0  PLT 124*  --   MCV 84.1  --   MCH 28.9  --   MCHC 34.4  --   RDW 15.6*  --   LYMPHSABS 1.2  --   MONOABS 0.3  --   EOSABS 0.2  --   BASOSABS 0.0  --     Chemistries   Recent Labs Lab 10/20/14 0903 10/20/14 0909  NA 135 138  K 4.0 3.9  CL 106 103  CO2 21*  --   GLUCOSE 94 93  BUN 15 17  CREATININE 1.33* 1.40*  CALCIUM 8.9  --   AST 37  --   ALT 21  --   ALKPHOS 56  --   BILITOT 0.8  --     estimated creatinine clearance is 37.2 mL/min (by C-G formula based on Cr of 1.4).  No results for input(s): TSH, T4TOTAL, T3FREE, THYROIDAB in the last 72 hours.  Invalid input(s): FREET3  Coagulation profile  Recent Labs Lab 10/20/14 0903  INR 1.23    No results for input(s): DDIMER in the last 72 hours.  Cardiac Enzymes No results for input(s): CKMB, TROPONINI, MYOGLOBIN in the last 168 hours.  Invalid input(s): CK  Invalid input(s): POCBNP  Urinalysis    Component Value Date/Time   COLORURINE YELLOW 09/20/2014 1449   APPEARANCEUR CLEAR 09/20/2014 1449   LABSPEC 1.009 09/20/2014 1449   PHURINE 6.0 09/20/2014 1449   GLUCOSEU NEGATIVE 09/20/2014 1449   HGBUR  NEGATIVE 09/20/2014 1449   BILIRUBINUR NEGATIVE 09/20/2014 1449   KETONESUR NEGATIVE 09/20/2014 1449   PROTEINUR NEGATIVE 09/20/2014 1449   UROBILINOGEN 1.0 09/20/2014 1449   NITRITE NEGATIVE 09/20/2014 1449   LEUKOCYTESUR NEGATIVE 09/20/2014 1449    Imaging results:   Ct Head Wo Contrast  10/20/2014   CLINICAL DATA:  Confusion, slurred speech.  EXAM: CT HEAD WITHOUT CONTRAST  TECHNIQUE: Contiguous axial images were obtained from the base of the skull through the vertex without intravenous  contrast.  COMPARISON:  CT scan of September 20, 2014.  FINDINGS: Bony calvarium appears intact. Moderate diffuse cortical atrophy is noted. Diffuse ventricular dilatation is noted consistent with the degree of atrophy. Minimal chronic ischemic white matter disease is noted. No mass effect or midline shift is noted. There is no evidence of mass lesion, hemorrhage or acute infarction.  IMPRESSION: Moderate diffuse cortical atrophy. Minimal chronic ischemic white matter disease. No acute intracranial abnormality seen. These results were called by telephone at the time of interpretation on 10/20/2014 at 9:24 am to Dr. Roseanne Reno, who verbally acknowledged these results.   Electronically Signed   By: Lupita Raider, M.D.   On: 10/20/2014 09:25   Ct Head Wo Contrast  09/20/2014   CLINICAL DATA:  Seizures beginning at 11 o'clock last night. Generalized weakness and fatigue.  EXAM: CT HEAD WITHOUT CONTRAST  TECHNIQUE: Contiguous axial images were obtained from the base of the skull through the vertex without intravenous contrast.  COMPARISON:  CT head without contrast 04/22/2009.  FINDINGS: Advanced generalized atrophy is again noted. The ventricles are proportionate to the degree of atrophy. Moderate cerebellar atrophy is present as well.  No acute cortical infarct, hemorrhage, or mass lesion is present. There is no significant extra-axial fluid collection.  The paranasal sinuses and mastoid air cells are clear. The calvarium is intact. No significant extracranial lesion is present. An occipital lipoma is stable.  IMPRESSION: 1. Stable moderate diffuse generalized atrophy. 2. No acute intracranial abnormality.   Electronically Signed   By: Marin Roberts M.D.   On: 09/20/2014 14:02     EKG: (Independently reviewed)  ventricular paced rhythm with apparent underlying atrial fibrillation and occasional unifocal PVC, ventricular rate 84 bpm, QTC 461 ms   Assessment & Plan  Principal Problem:   TIA/Fall at  home -Admit to telemetry/observational status -Neurology following -Unable to pursue MRI secondary to pacemaker therefore will need follow-up CT of the head prior to discharge -Carotid duplex and echocardiogram -Hemoglobin A1c and lipid panel -PT/OT/SLP -Was on aspirin 81 mg at home and will increase to 325 mg at admission -Did have fall at home related to acute unilateral weakness but no apparent injuries noted on exam and patient not complaining of injuries/pain; continue to monitor in the event additional workup is indicated  Active Problems:   Chronic atrial fibrillation -CHADVASc = 5 -In the past has refused anticoagulation due to history of GI bleeding; in addition at the time patient was previously on anticoagulation the only option available was warfarin-patient is a retired Firefighter and refers to this repeatedly as "rat poison" and definitely does not want to utilize this as anticoagulation but is willing to try NOAC if indicated    Seizure disorder -Patient was brought to the ER on 09/20/14 for breakthrough seizures and was evaluated by the neurology team. His Keppra was discontinued after pacemaker placement June 24 since he had not had any further seizure activity. He has a history of lethargy if AED dose is high  or if he is on more than one medication-it has been documented that he could not tolerate Keppra 500 mg twice a day in the past. -He was restarted on Keppra 250 mg twice a day and has been seizure-free since -Instituted seizure precautions in setting of acute neurological insult i.e. TIA    Hypertension -Current blood pressure well controlled on preadmission medications of Norvasc and Diovan    Leukopenia and thrombocytopenia without anemia -Unclear significance noting this is a new finding since January 2016 and is not associated with anemia or active bleeding -May be related to medication side effect-the following medications have been implicated in either  leukopenia, neutropenia or thrombocytopenia: Colchicine, Pepcid, Lasix, Keppra and Diovan -currently patient has mild nonsymptomatic findings and at this juncture do not feel discontinuation of any these medications is indicated and will continue to follow lab work monitoring for worsening of leukopenia/thrombocytopenia  -We'll check anemia panel and peripheral smear to determine if additional workup is indicated    Hyperlipidemia -Does not appear was on statin prior to admission -Follow-up on fasting lipid panel obtained this admission    Hypothyroidism -TSH was 2.7 in January 2016 -Continue preadmission dosing of Synthroid    CKD, stage III -Renal function and GFR stable and her previous baseline    OSA  -Monitor for nocturnal hypoxemia    Cardiac pacemaker in situ -Placed for complete heart block 07/28/2014    DVT Prophylaxis: Lovenox since platelets are greater than 100,000  Family Communication:   Daughter and other family members at the bedside  Code Status:  Full code  Condition:  Stable  Discharge disposition:  Anticipate discharge back to home environment pending resolution of TIA symptoms and pending PT/OT evaluation  Time spent in minutes : 60      Ulysess Witz L. ANP on 10/20/2014 at 10:15 AM  Between 7am to 7pm - Pager - [phone]  After 7pm go to [email or web address] - password [email or web address]  And look for the night coverage person covering me after hours  [facility]

## 2014-10-20 NOTE — ED Notes (Signed)
patient being transported to CT at this time with Lillia Abed, RN and stroke team in tow

## 2014-10-20 NOTE — ED Provider Notes (Signed)
CSN: [id number]     Arrival date & time 10/20/14  1478 History   First MD Initiated Contact with Patient 10/20/14 [phone]     Chief Complaint  Patient presents with  . Code Stroke   Patient is a 81 y.o. male presenting with general illness. The history is provided by the patient and the EMS personnel. No language interpreter was used.  Illness Location:  Generalized Quality:  Slurred speech, weakness worse on left Onset quality:  Sudden Timing:  Constant Progression:  Partially resolved Chronicity:  New Context:  PMHx of HTN, HLD, seizures, and A-fib presenting with slurred speech & weakness. This morning asymptomatic. Wanted to bathroom to brush teeth and when walking back to bedroom to get dressed for a week in his legs. Attempted to call daughter called from the church. Speech noted to be slurred over the phone. When EMS arrived patient was noted to have a speech and severe weakness on left with facial droop. No previous history of CVA or TIA. Associated symptoms: no abdominal pain, no chest pain, no cough, no fever, no nausea and no vomiting     Past Medical History  Diagnosis Date  . Hypertension   . Seizure disorder   . Glaucoma   . Hyperlipidemia   . Osteopenia   . Compression fracture of spine     T 10 and T11  . Gout   . OSA (obstructive sleep apnea)     on CPAP therapy  . Hypothyroid   . Inguinal hernia   . Hemorrhoid   . Chronic systolic dysfunction of left ventricle     EF 30-35% by echo 10/10/2009  . Major depression   . History of GI bleed     from coumadin  . MR (mitral regurgitation)   . Hearing loss   . Dizziness     intermittent  . Kidney stone   . CKD (chronic kidney disease), stage III   . Permanent atrial fibrillation     refuses coumadin due to history of GI bleed  . Decreased appetite 03/02/12  . Weight loss 03/02/12  . Complete heart block    Past Surgical History  Procedure Laterality Date  . Transurethral resection of prostate    . Cardiac  catheterization  2002  . Pacemaker insertion  07/28/06    VVI pacemaker for complete heart block by Dr Amil Amen  . Inguinal hernia repair    . Hemorrhoid surgery    . Colonoscopy with propofol N/A 05/01/2014    Procedure: COLONOSCOPY WITH PROPOFOL;  Surgeon: [name], MD;  Location: [facility];  Service: Endoscopy;  Laterality: N/A;   Family History  Problem Relation Age of Onset  . Heart failure Mother   . CVA Sister   . Aneurysm Brother     brain  . Colon cancer Neg Hx   . Colon polyps Neg Hx    Social History  Substance Use Topics  . Smoking status: Never Smoker   . Smokeless tobacco: Never Used  . Alcohol Use: No    Review of Systems  Constitutional: Negative for fever and chills.  Respiratory: Negative for cough.   Cardiovascular: Negative for chest pain.  Gastrointestinal: Negative for nausea, vomiting and abdominal pain.  Neurological: Positive for speech difficulty, weakness and numbness. Negative for dizziness, tremors, syncope and facial asymmetry.  All other systems reviewed and are negative.   Allergies  Coumadin and Lisinopril  Home Medications   Prior to Admission medications   Medication Sig Start  Date End Date Taking? Authorizing Provider  acetaminophen (TYLENOL) 500 MG tablet Take 500 mg by mouth every 6 (six) hours as needed (pain).   Yes Historical Provider, MD  alendronate (FOSAMAX) 70 MG tablet Take 70 mg by mouth every Monday.  02/05/14  Yes Historical Provider, MD  amLODipine (NORVASC) 10 MG tablet Take 5 mg by mouth daily as needed (diastolic blood pressure over 90).    Yes Historical Provider, MD  aspirin EC 81 MG tablet Take 81 mg by mouth daily.   Yes Historical Provider, MD  cetirizine (ZYRTEC) 10 MG tablet Take 10 mg by mouth daily.   Yes Historical Provider, MD  colchicine 0.6 MG tablet TAKE 1 TABLET TWICE DAILY Patient taking differently: TAKE 1 TABLET TWICE DAILY AS NEEDED FOR GOUT 10/05/14  Yes Doe-Hyun Sherran Needs, DO   Dextromethorphan-Guaifenesin (CORICIDIN HBP CONGESTION/COUGH) 10-200 MG CAPS Take 1 capsule by mouth daily as needed (for cold).    Yes Historical Provider, MD  dorzolamide-timolol (COSOPT) 22.3-6.8 MG/ML ophthalmic solution Place 1 drop into both eyes 2 (two) times daily. 09/09/14  Yes Historical Provider, MD  famotidine (PEPCID AC) 10 MG chewable tablet Chew 10 mg by mouth daily as needed for heartburn.  07/18/04  Yes Historical Provider, MD  fluticasone (FLONASE) 50 MCG/ACT nasal spray Place 2 sprays into both nostrils daily as needed for allergies or rhinitis. \   Yes Historical Provider, MD  furosemide (LASIX) 20 MG tablet Take 1 tablet (20 mg total) by mouth daily. 04/07/14  Yes Lyn Records, MD  latanoprost (XALATAN) 0.005 % ophthalmic solution Place 1 drop into both eyes at bedtime.  02/05/14  Yes Historical Provider, MD  levETIRAcetam (KEPPRA) 500 MG tablet Take 250-500 mg by mouth 2 (two) times daily. Take 1/2 tablet (250 mg) by mouth every morning and 1 tablet (500 mg) every night   Yes Historical Provider, MD  levothyroxine (SYNTHROID, LEVOTHROID) 25 MCG tablet TAKE 1 TABLET (25 MCG TOTAL) BY MOUTH DAILY BEFORE BREAKFAST. 07/24/14  Yes Doe-Hyun Sherran Needs, DO  Misc Natural Products (BLACK CHERRY CONCENTRATE PO) Take 1 capsule by mouth daily.   Yes Historical Provider, MD  Multiple Vitamin (MULTIVITAMIN WITH MINERALS) TABS tablet Take 1 tablet by mouth daily.   Yes Historical Provider, MD  valsartan (DIOVAN) 320 MG tablet Take 1 tablet (320 mg total) by mouth daily with lunch. 04/07/14  Yes Lyn Records, MD  vitamin B-12 (CYANOCOBALAMIN) 1000 MCG tablet Take 1,000 mcg by mouth daily.   Yes Historical Provider, MD  zolpidem (AMBIEN) 5 MG tablet TAKE 1 TABLET AT BEDTIME Patient taking differently: TAKE 1/2 TABLET BY MOUTH AT BEDTIME 08/25/14  Yes Doe-Hyun R Artist Pais, DO  lacosamide (VIMPAT) 50 MG TABS tablet Take 1 tablet (50 mg total) by mouth 2 (two) times daily. Patient not taking: Reported on 10/20/2014  09/20/14   Charlestine Night, PA-C   BP 151/61 mmHg  Pulse 73  Temp(Src) 98.6 F (37 C) (Oral)  Resp 15  SpO2 100%   Physical Exam  Constitutional: He is oriented to person, place, and time. No distress.  HENT:  Head: Normocephalic and atraumatic.  Eyes: Conjunctivae are normal. Pupils are equal, round, and reactive to light.  Neck: Neck supple.  Cardiovascular: Normal rate, regular rhythm and intact distal pulses.   Pulmonary/Chest: Effort normal and breath sounds normal.  Abdominal: Soft. Bowel sounds are normal.  Musculoskeletal: He exhibits no edema or tenderness.  Neurological: He is alert and oriented to person, place, and time. He has  normal reflexes. He displays no atrophy, no tremor and normal reflexes. No cranial nerve deficit or sensory deficit. He exhibits normal muscle tone. He displays no seizure activity. Coordination normal.  Skin: Skin is warm and dry. He is not diaphoretic.    ED Course  Procedures   Labs Review Labs Reviewed  PROTIME-INR - Abnormal; Notable for the following:    Prothrombin Time 15.6 (*)    All other components within normal limits  CBC - Abnormal; Notable for the following:    WBC 2.7 (*)    HCT 38.7 (*)    RDW 15.6 (*)    Platelets 124 (*)    All other components within normal limits  DIFFERENTIAL - Abnormal; Notable for the following:    Neutro Abs 1.0 (*)    All other components within normal limits  COMPREHENSIVE METABOLIC PANEL - Abnormal; Notable for the following:    CO2 21 (*)    Creatinine, Ser 1.33 (*)    GFR calc non Af Amer 46 (*)    GFR calc Af Amer 54 (*)    All other components within normal limits  I-STAT CHEM 8, ED - Abnormal; Notable for the following:    Creatinine, Ser 1.40 (*)    Calcium, Ion 1.12 (*)    All other components within normal limits  APTT  PATHOLOGIST SMEAR REVIEW  VITAMIN B12  FOLATE  IRON AND TIBC  FERRITIN  RETICULOCYTES  I-STAT TROPOININ, ED  CBG MONITORING, ED   Imaging Review Ct  Head Wo Contrast  10/20/2014   CLINICAL DATA:  Confusion, slurred speech.  EXAM: CT HEAD WITHOUT CONTRAST  TECHNIQUE: Contiguous axial images were obtained from the base of the skull through the vertex without intravenous contrast.  COMPARISON:  CT scan of September 20, 2014.  FINDINGS: Bony calvarium appears intact. Moderate diffuse cortical atrophy is noted. Diffuse ventricular dilatation is noted consistent with the degree of atrophy. Minimal chronic ischemic white matter disease is noted. No mass effect or midline shift is noted. There is no evidence of mass lesion, hemorrhage or acute infarction.  IMPRESSION: Moderate diffuse cortical atrophy. Minimal chronic ischemic white matter disease. No acute intracranial abnormality seen. These results were called by telephone at the time of interpretation on 10/20/2014 at 9:24 am to Dr. Roseanne Reno, who verbally acknowledged these results.   Electronically Signed   By: Lupita Raider, M.D.   On: 10/20/2014 09:25   I have personally reviewed and evaluated these images and lab results as part of my medical decision-making.   EKG Interpretation None      MDM  Mr. Briant Sites is an 81 yo male w/ PMHx of HTN, HLD, seizures, and A-fib presenting with slurred speech & weakness. This morning aymptomatic. Wanted to bathroom to brush teeth and when walking back to bedroom to get dressed for a week in his legs. Attempted to call daughter called from the church. Speech noted to be slurred over the phone. When EMS arrived patient was noted to have a speech and severe weakness on left with facial droop. No previous history of CVA or TIA.  Exam above notable for elderly male lying in stretcher in acute distress. Afebrile. Not tachycardic. Normotensive. Lungs clear to auscultation bilaterally. Abdomen benign. Neuro exam notable for alert and oriented 3, PERRLA, moving all extremities and strength 5/5 throughout, decreased reflexes throughout, speech clear.  Neurology present for  initial evaluation. Patient went straight to CT scanner. CT head showing no acute intracranial abnormality. Neurology recommending  no acute intervention with TPA at this time given rapid improvement in patient's symptoms.  Patient noted to hospital service for further evaluation and management of suspected TIA. Patient and family understand and agree with the plan have no further questions or concerns this time.  Patient care discussed with followed by my attending, Dr. Rolan Bucco   Final diagnoses:  Left-sided weakness  Slurred speech     Angelina Ok, MD 10/20/14 1301  Rolan Bucco, MD 10/20/14 1327

## 2014-10-20 NOTE — ED Notes (Signed)
Pt woke up normal this morning. Pt felt weak all of the sudden at 0700 (LSN) and pt fell. Pt started having slurred speech. EMS arrived, pt flaccid on L side and speech slurred with L sided facial droop. Pt symptoms have improved some upon arrival. CBG 105, BP 180/100, HR 80's paced rhythm. PT alert and oriented

## 2014-10-20 NOTE — Consult Note (Signed)
Referring Physician: Fredderick Phenix    Chief Complaint: Code stroke  HPI:                                                                                                                                         Oscar Santiago is an 81 y.o. male presenting to the hospital.  Patient awoke at 0700 hours and felt normal.  He went to put on his shirt and noted his left arm was week and then fell due to his left leg giving out. EMS was called to  [facility] for a fall but on arrival noted he had left sided plegia and facial droop. He was also noted to have dysarthria.  On arrival to ED his dysarthria resolved but continued to have left arm weakness but NOT plegia. He only had a left arm drift.   Date last known well: Date: 10/20/2014 Time last known well: Time: 07:00 tPA Given: No: significant improvement since arrived in hospital     Past Medical History  Diagnosis Date  . Hypertension   . Seizure disorder   . Glaucoma   . Hyperlipidemia   . Osteopenia   . Compression fracture of spine     T 10 and T11  . Gout   . OSA (obstructive sleep apnea)     on CPAP therapy  . Hypothyroid   . Inguinal hernia   . Hemorrhoid   . Chronic systolic dysfunction of left ventricle     EF 30-35% by echo 10/10/2009  . Major depression   . History of GI bleed     from coumadin  . MR (mitral regurgitation)   . Hearing loss   . Dizziness     intermittent  . Kidney stone   . CKD (chronic kidney disease), stage III   . Permanent atrial fibrillation     refuses coumadin due to history of GI bleed  . Decreased appetite 03/02/12  . Weight loss 03/02/12  . Complete heart block     Past Surgical History  Procedure Laterality Date  . Transurethral resection of prostate    . Cardiac catheterization  2002  . Pacemaker insertion  07/28/06    VVI pacemaker for complete heart block by Dr Amil Amen  . Inguinal hernia repair    . Hemorrhoid surgery    . Colonoscopy with propofol N/A 05/01/2014    Procedure: COLONOSCOPY WITH  PROPOFOL;  Surgeon: [name], MD;  Location: [facility];  Service: Endoscopy;  Laterality: N/A;    Family History  Problem Relation Age of Onset  . Heart failure Mother   . CVA Sister   . Aneurysm Brother     brain  . Colon cancer Neg Hx   . Colon polyps Neg Hx    Social History:  reports that he has never smoked. He has never used smokeless tobacco. He reports that he does not drink  alcohol or use illicit drugs.  Allergies:  Allergies  Allergen Reactions  . Coumadin [Warfarin Sodium] Other (See Comments)    GI Bleed   . Lisinopril Cough    Medications:                                                                                                                           No current facility-administered medications for this encounter.   Current Outpatient Prescriptions  Medication Sig Dispense Refill  . alendronate (FOSAMAX) 70 MG tablet Take 70 mg by mouth every Monday.     Marland Kitchen amLODipine (NORVASC) 10 MG tablet Take 5 mg by mouth daily as needed (when BP is in upper 80s-usually at bedtime).     Marland Kitchen aspirin 81 MG tablet Take 81 mg by mouth daily.     . cetirizine (ZYRTEC) 10 MG tablet Take 10 mg by mouth daily.    Marland Kitchen CHERRY PO Take 1 capsule by mouth daily.    . colchicine 0.6 MG tablet TAKE 1 TABLET TWICE DAILY (Patient taking differently: TAKE 1 TABLET TWICE DAILY as needed) 30 tablet 4  . Cyanocobalamin (VITAMIN B 12 PO) Take 1,000 mg by mouth daily.    Marland Kitchen Dextromethorphan-Guaifenesin (CORICIDIN HBP CONGESTION/COUGH) 10-200 MG CAPS Take 1 capsule by mouth daily as needed (for cold).     . dorzolamide-timolol (COSOPT) 22.3-6.8 MG/ML ophthalmic solution Place 1 drop into both eyes 2 (two) times daily.  6  . famotidine (PEPCID AC) 10 MG chewable tablet Chew 10 mg by mouth daily as needed for heartburn.     . fluticasone (FLONASE) 50 MCG/ACT nasal spray Place 2 sprays into both nostrils daily as needed for allergies or rhinitis. \    . furosemide (LASIX) 20 MG tablet Take 1  tablet (20 mg total) by mouth daily. 30 tablet 11  . lacosamide (VIMPAT) 50 MG TABS tablet Take 1 tablet (50 mg total) by mouth 2 (two) times daily. 60 tablet 0  . latanoprost (XALATAN) 0.005 % ophthalmic solution Place 1 drop into both eyes at bedtime.     . levETIRAcetam (KEPPRA) 500 MG tablet Take 1/2 tablet in the morning and 1 tablet at bedtime.  5  . levothyroxine (SYNTHROID, LEVOTHROID) 25 MCG tablet TAKE 1 TABLET (25 MCG TOTAL) BY MOUTH DAILY BEFORE BREAKFAST. 90 tablet 1  . Multiple Vitamin (MULTIVITAMIN) capsule Take 1 capsule by mouth daily.    . valsartan (DIOVAN) 320 MG tablet Take 1 tablet (320 mg total) by mouth daily with lunch. 30 tablet 11  . zolpidem (AMBIEN) 5 MG tablet TAKE 1 TABLET AT BEDTIME 30 tablet 2     ROS:  History obtained from the patient  General ROS: negative for - chills, fatigue, fever, night sweats, weight gain or weight loss Psychological ROS: negative for - behavioral disorder, hallucinations, memory difficulties, mood swings or suicidal ideation Ophthalmic ROS: negative for - blurry vision, double vision, eye pain or loss of vision ENT ROS: negative for - epistaxis, nasal discharge, oral lesions, sore throat, tinnitus or vertigo Allergy and Immunology ROS: negative for - hives or itchy/watery eyes Hematological and Lymphatic ROS: negative for - bleeding problems, bruising or swollen lymph nodes Endocrine ROS: negative for - galactorrhea, hair pattern changes, polydipsia/polyuria or temperature intolerance Respiratory ROS: negative for - cough, hemoptysis, shortness of breath or wheezing Cardiovascular ROS: negative for - chest pain, dyspnea on exertion, edema or irregular heartbeat Gastrointestinal ROS: negative for - abdominal pain, diarrhea, hematemesis, nausea/vomiting or stool incontinence Genito-Urinary ROS: negative  for - dysuria, hematuria, incontinence or urinary frequency/urgency Musculoskeletal ROS: negative for - joint swelling or muscular weakness Neurological ROS: as noted in HPI Dermatological ROS: negative for rash and skin lesion changes  Neurologic Examination:                                                                                                      There were no vitals taken for this visit.  HEENT-  Normocephalic, no lesions, without obvious abnormality.  Normal external eye and conjunctiva.  Normal TM's bilaterally.  Normal auditory canals and external ears. Normal external nose, mucus membranes and septum.  Normal pharynx. Cardiovascular- S1, S2 normal, pulses palpable throughout   Lungs- chest clear, no wheezing, rales, normal symmetric air entry Abdomen- normal findings: bowel sounds normal Extremities- no edema Lymph-no adenopathy palpable Musculoskeletal-no joint tenderness, deformity or swelling Skin-warm and dry, no hyperpigmentation, vitiligo, or suspicious lesions  Neurological Examination Mental Status: Alert, oriented, thought content appropriate.  Speech fluent without evidence of aphasia.  Able to follow 3 step commands without difficulty. Cranial Nerves: II: Discs flat bilaterally; Visual fields grossly normal, pupils equal, round, reactive to light and accommodation III,IV, VI: ptosis not present, extra-ocular motions intact bilaterally V,VII: smile symmetric, facial light touch sensation normal bilaterally VIII: hearing normal bilaterally IX,X: uvula rises symmetrically XI: bilateral shoulder shrug XII: midline tongue extension Motor: Right : Upper extremity   5/5    Left:     Upper extremity   5/5  Lower extremity   5/5     Lower extremity   5/5 --drift with left arm Tone and bulk:normal tone throughout; no atrophy noted Sensory: Pinprick and light touch decreased on the left Deep Tendon Reflexes: 2+ and symmetric throughout UE and no LE  DTR Plantars: Right: downgoing   Left: downgoing Cerebellar: normal finger-to-nose on the right and dysmetric on the left due to weakness Gait: not tested due to code        Lab Results: Basic Metabolic Panel:  Recent Labs Lab 10/20/14 0909  NA 138  K 3.9  CL 103  GLUCOSE 93  BUN 17  CREATININE 1.40*    Liver Function Tests: No results for input(s): AST, ALT, ALKPHOS, BILITOT, PROT,  ALBUMIN in the last 168 hours. No results for input(s): LIPASE, AMYLASE in the last 168 hours. No results for input(s): AMMONIA in the last 168 hours.  CBC:  Recent Labs Lab 10/20/14 0903 10/20/14 0909  WBC 2.7*  --   NEUTROABS PENDING  --   HGB 13.3 15.0  HCT 38.7* 44.0  MCV 84.1  --   PLT 124*  --     Cardiac Enzymes: No results for input(s): CKTOTAL, CKMB, CKMBINDEX, TROPONINI in the last 168 hours.  Lipid Panel: No results for input(s): CHOL, TRIG, HDL, CHOLHDL, VLDL, LDLCALC in the last 168 hours.  CBG: No results for input(s): GLUCAP in the last 168 hours.  Microbiology: Results for orders placed or performed in visit on 08/12/07  Smear     Status: None   Collection Time: 09/09/07  4:23 PM  Result Value Ref Range Status   Smear Result Smear Available  Final    Coagulation Studies: No results for input(s): LABPROT, INR in the last 72 hours.  Imaging: No results found.     Assessment and plan discussed with with attending physician and they are in agreement.    Felicie Morn PA-C [facility] [phone]  10/20/2014, 9:23 AM   Assessment: 81 y.o. male presenting to ED after sudden onset of left sided weakness and dysarthria.  On arrival symptoms improved.  Dysarthria resolved and left arm only showed a drift. Cannot exclude right MCA territory small vessel stroke versus TIA.   Stroke Risk Factors - hyperlipidemia and hypertension, also family history  Recommend: 1. HgbA1c, fasting lipid panel 2. MRI, MRA  of the brain without contrast 3. PT  consult, OT consult, Speech consult 4. Echocardiogram 5. Carotid dopplers 6. Prophylactic therapy-Antiplatelet med: Aspirin - dose 325 mg daily 7. Risk factor modification 8. Telemetry monitoring 9. Frequent neuro checks 10 NPO until passes stroke swallow screen  I personally participated in this patient's evaluation and management, including formulating above clinical assessment and management recommendations.  Venetia Maxon M.D. [facility] [phone]

## 2014-10-20 NOTE — Progress Notes (Deleted)
*  PRELIMINARY RESULTS* Echocardiogram Echocardiogram Pharmacologic Stress Test has been performed.  Oscar Santiago 10/20/2014, 3:53 PM

## 2014-10-20 NOTE — ED Notes (Signed)
Patient undressed, on monitor, continuous pulse oximetry and blood pressure cuff; stroke team and Belfi, MD at bedside

## 2014-10-20 NOTE — ED Notes (Signed)
Attempted report x1. 

## 2014-10-20 NOTE — ED Notes (Signed)
Pt symptoms improving. Q15 vitals, Q30 neuro checks until 11:30. Pt able to raise arm and leg on left side, no drift. Pt alert and oriented. Equal grips

## 2014-10-20 NOTE — Progress Notes (Signed)
Chronic Systolic heart failure/NYHA Class I -ECHO 2011 EF was 30-35% -ECHO this admit EF 40% -currently compensated and no chronic sx's -cont Lasix  Junious Silk, ANP

## 2014-10-20 NOTE — ED Notes (Signed)
Belongings were placed in a bag and taken up with the pt

## 2014-10-20 NOTE — Progress Notes (Signed)
  Echocardiogram 2D Echocardiogram has been performed.  Janalyn Harder 10/20/2014, 3:54 PM

## 2014-10-20 NOTE — Progress Notes (Signed)
   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 Chaplain responded to code stroke at the ED. When chaplain arrived Oscar Santiago was in high spirits and had family support. Oscar Santiago was full of stories and enjoyed the visitation. Oscar Santiago kept referring to the past and chaplain encouraged Pt. to focus on the present by taking about currents feelings and currents concerns.  Chaplain will follow up.

## 2014-10-20 NOTE — ED Notes (Signed)
POCT CBG resulted 91

## 2014-10-20 NOTE — Progress Notes (Signed)
Pt is admitted to 5M20 from ED. Admission vital sign is stable

## 2014-10-21 ENCOUNTER — Observation Stay (HOSPITAL_COMMUNITY)
Admit: 2014-10-21 | Discharge: 2014-10-21 | Disposition: A | Discharge status: Home / Self Care | Payer: Medicare Other | Attending: Neurology | Admitting: Neurology

## 2014-10-21 DIAGNOSIS — I482 Chronic atrial fibrillation: Secondary | ICD-10-CM | POA: Diagnosis not present

## 2014-10-21 DIAGNOSIS — G459 Transient cerebral ischemic attack, unspecified: Secondary | ICD-10-CM | POA: Diagnosis not present

## 2014-10-21 DIAGNOSIS — G40909 Epilepsy, unspecified, not intractable, without status epilepticus: Secondary | ICD-10-CM

## 2014-10-21 DIAGNOSIS — N183 Chronic kidney disease, stage 3 (moderate): Secondary | ICD-10-CM

## 2014-10-21 DIAGNOSIS — E785 Hyperlipidemia, unspecified: Secondary | ICD-10-CM

## 2014-10-21 DIAGNOSIS — Z01810 Encounter for preprocedural cardiovascular examination: Secondary | ICD-10-CM

## 2014-10-21 DIAGNOSIS — I5022 Chronic systolic (congestive) heart failure: Secondary | ICD-10-CM | POA: Diagnosis not present

## 2014-10-21 DIAGNOSIS — G4733 Obstructive sleep apnea (adult) (pediatric): Secondary | ICD-10-CM

## 2014-10-21 DIAGNOSIS — G451 Carotid artery syndrome (hemispheric): Secondary | ICD-10-CM

## 2014-10-21 DIAGNOSIS — Z950 Presence of cardiac pacemaker: Secondary | ICD-10-CM | POA: Diagnosis not present

## 2014-10-21 DIAGNOSIS — I639 Cerebral infarction, unspecified: Secondary | ICD-10-CM

## 2014-10-21 DIAGNOSIS — G458 Other transient cerebral ischemic attacks and related syndromes: Secondary | ICD-10-CM | POA: Diagnosis not present

## 2014-10-21 LAB — CBC
HCT: 38.6 % — ABNORMAL LOW (ref 39.0–52.0)
Hemoglobin: 13.1 g/dL (ref 13.0–17.0)
MCH: 28.7 pg (ref 26.0–34.0)
MCHC: 33.9 g/dL (ref 30.0–36.0)
MCV: 84.6 fL (ref 78.0–100.0)
PLATELETS: 126 10*3/uL — AB (ref 150–400)
RBC: 4.56 MIL/uL (ref 4.22–5.81)
RDW: 15.9 % — ABNORMAL HIGH (ref 11.5–15.5)
WBC: 2.9 10*3/uL — AB (ref 4.0–10.5)

## 2014-10-21 LAB — BASIC METABOLIC PANEL
Anion gap: 6 (ref 5–15)
BUN: 13 mg/dL (ref 6–20)
CO2: 25 mmol/L (ref 22–32)
Calcium: 8.6 mg/dL — ABNORMAL LOW (ref 8.9–10.3)
Chloride: 105 mmol/L (ref 101–111)
Creatinine, Ser: 1.24 mg/dL (ref 0.61–1.24)
GFR calc Af Amer: 58 mL/min — ABNORMAL LOW (ref >60)
GFR, EST NON AFRICAN AMERICAN: 50 mL/min — AB (ref >60)
Glucose, Bld: 94 mg/dL (ref 65–99)
POTASSIUM: 3.8 mmol/L (ref 3.5–5.1)
SODIUM: 136 mmol/L (ref 135–145)

## 2014-10-21 LAB — LIPID PANEL
CHOL/HDL RATIO: 3.7 ratio
CHOLESTEROL: 159 mg/dL (ref 0–200)
HDL: 43 mg/dL (ref >40)
LDL Cholesterol: 105 mg/dL — ABNORMAL HIGH (ref 0–99)
Triglycerides: 55 mg/dL (ref <150)
VLDL: 11 mg/dL (ref 0–40)

## 2014-10-21 MED ORDER — METOPROLOL SUCCINATE ER 25 MG PO TB24
25.0000 mg | ORAL_TABLET | Freq: Every day | ORAL | Status: DC
  Administered 2014-10-21 – 2014-10-28 (×7): 25 mg via ORAL
  Filled 2014-10-21 (×8): qty 1

## 2014-10-21 MED ORDER — PRAVASTATIN SODIUM 20 MG PO TABS
20.0000 mg | ORAL_TABLET | Freq: Every day | ORAL | Status: DC
  Administered 2014-10-21 – 2014-10-28 (×7): 20 mg via ORAL
  Filled 2014-10-21 (×7): qty 1

## 2014-10-21 NOTE — Progress Notes (Signed)
[facility] Progress Note   Oscar Santiago  BHA:[id number]  DOB: 09/07/1925  DOA: 10/20/2014 PCP: Kristian Covey, MD  Brief narrative: Oscar Santiago is a 81 y.o. male with hypertension, seizure disorder, dyslipidemia, sleep apnea, gout, hypothyroidism, chronic kidney disease stage III, complete heart block status post pacemaker, A. fib not on anticoagulation. The patient presents with a complaint of left-sided weakness. He specifically states that his left arm was weak yesterday but improved slowly and by the time he arrived to the ER weakness had resolved. He is unable to tell me his left leg was weak as well.   Subjective: States that weakness has resolved completely  Assessment/Plan: Principal Problem:   TIA (transient ischemic attack) -Unable to obtain MRI due to his pacemaker but CT of the head does not show an acute infarct - found to have significant carotid artery stenosis on CTA-neurology recommending vascular surgery consult - neurology also is suspicious that he may have had a seizure and recommending to continue Keppra  -Neurology recommending to start eliquis 5 mg twice a day for A. fib for secondary stroke prevention-will get surgical eval prior to starting anticoagulation- of note no cardiac thrombus noted on echo - LDL 105  Active Problems:  Bilateral carotid artery disease -Have consult with vascular surgery to determine need for surgery -Dr. York Pellant and has requested a cardiology consult for cardiac clearance which I have requested as well    Chronic atrial fibrillation (CHADVASc = 5) -Apparently had some GI bleeding back in the 1990s -he states he has not had any bleeding since-no history of PUD either  - I have discussed starting anticoagulation for stroke prevention and he is in agreement with this  Chronic systolic heart failure -EF 40%-continue low-dose Lasix and watch I and O -On Diovan at home - cont Metoprolol-started by cardiology  today -Also has Moderate mitral regurgitation and tricuspid regurgitation    Hypertension -Continue current medications  Seizure disorder Continue Keppra per neurology    Hyperlipidemia -Continue pravastatin    OSA (obstructive sleep apnea) -Will order C Pap    Cardiac pacemaker in situ    CKD (chronic kidney disease), stage III - stable    Hypothyroidism --Continue Synthroid  Mild thrombocytopenia and leukopenia -When looking back at his blood work, it appears that his thrombocytopenia is acute since August  -Leukopenia dates back to January of this year  -Hemoglobin stable  -Continue to follow-may need hematology workup  -Will order a manual differential   Code Status:     Code Status Orders        Start     Ordered   10/20/14 1156  Full code   Continuous     10/20/14 1155     Family Communication: Granddaughter at bedside DVT prophylaxis: Lovenox Consultants: Neurology, vascular surgery, cardiology  Antibiotics: Anti-infectives    None      Objective: There were no vitals filed for this visit.  Intake/Output Summary (Last 24 hours) at 10/21/14 1756 Last data filed at 10/21/14 1100  Gross per 24 hour  Intake    240 ml  Output    125 ml  Net    115 ml     Vitals Filed Vitals:   10/20/14 1743 10/21/14 0955 10/21/14 1425 10/21/14 1735  BP: 152/65 141/71 131/61 139/70  Pulse: 64 61 62 69  Temp: 98.7 F (37.1 C) 98.1 F (36.7 C) 97.9 F (36.6 C) 97.8 F (36.6 C)  TempSrc: Oral Oral Oral Oral  Resp:  18 18 18 18   SpO2: 100% 100% 100% 100%    Exam:  General:  Pt is alert, not in acute distress  HEENT: No icterus, No thrush, oral mucosa moist  Cardiovascular:IIRR, S1/S2 No murmur  Respiratory: clear to auscultation bilaterally   Abdomen: Soft, +Bowel sounds, non tender, non distended, no guarding  MSK: No LE edema, cyanosis or clubbing  Data Reviewed: Basic Metabolic Panel:  Recent Labs Lab 10/20/14 0903 10/20/14 0909  10/21/14 0713  NA 135 138 136  K 4.0 3.9 3.8  CL 106 103 105  CO2 21*  --  25  GLUCOSE 94 93 94  BUN 15 17 13   CREATININE 1.33* 1.40* 1.24  CALCIUM 8.9  --  8.6*   Liver Function Tests:  Recent Labs Lab 10/20/14 0903  AST 37  ALT 21  ALKPHOS 56  BILITOT 0.8  PROT 6.8  ALBUMIN 3.7   No results for input(s): LIPASE, AMYLASE in the last 168 hours. No results for input(s): AMMONIA in the last 168 hours. CBC:  Recent Labs Lab 10/20/14 0903 10/20/14 0909 10/21/14 0713  WBC 2.7*  --  2.9*  NEUTROABS 1.0*  --   --   HGB 13.3 15.0 13.1  HCT 38.7* 44.0 38.6*  MCV 84.1  --  84.6  PLT 124*  --  126*   Cardiac Enzymes: No results for input(s): CKTOTAL, CKMB, CKMBINDEX, TROPONINI in the last 168 hours. BNP (last 3 results) No results for input(s): BNP in the last 8760 hours.  ProBNP (last 3 results) No results for input(s): PROBNP in the last 8760 hours.  CBG:  Recent Labs Lab 10/20/14 0922  GLUCAP 91    No results found for this or any previous visit (from the past 240 hour(s)).   Studies: Ct Angio Head W/cm &/or Wo Cm  10/20/2014   CLINICAL DATA:  Gait disturbance. Slurred speech. Seizures. Atrial fibrillation. Symptoms began today.  EXAM: CT ANGIOGRAPHY HEAD AND NECK  TECHNIQUE: Multidetector CT imaging of the head and neck was performed using the standard protocol during bolus administration of intravenous contrast. Multiplanar CT image reconstructions and MIPs were obtained to evaluate the vascular anatomy. Carotid stenosis measurements (when applicable) are obtained utilizing NASCET criteria, using the distal internal carotid diameter as the denominator.  CONTRAST:  50mL OMNIPAQUE IOHEXOL 350 MG/ML SOLN  COMPARISON:  CT earlier same day.  FINDINGS: CT HEAD  Brain shows generalized atrophy with mild chronic small-vessel changes of the white matter. No sign of acute infarction, mass lesion, hemorrhage, hydrocephalus or extra-axial collection.  CTA NECK  Aortic arch:  Mild atherosclerosis of the arch without aneurysm or dissection. Branching pattern of the brachiocephalic vessels from the arch is normal without origin stenosis.  Right carotid system: Common carotid artery widely patent to the bifurcation. There is advanced atherosclerotic disease affecting the proximal internal carotid artery. Minimal diameter in the bulb is 1.5 mm. Compared to a more distal cervical ICA diameter of 5 mm, this indicates 70% stenosis.  Left carotid system: Common carotid artery widely patent to the bifurcation region. Complex atherosclerotic disease of the ICA bulb with minimal diameter of 1 mm. Compared to a more distal cervical ICA diameter of 5 mm, this indicates an 80% stenosis.  Vertebral arteries:The left vertebral artery is dominant. There is 20% stenosis at the origin. The vessel is widely patent beyond that. The nondominant right vertebral artery is widely patent at its origin and through the cervical region.  Skeleton: Ordinary spondylosis  Other neck: No  significant soft tissue lesion no upper chest lesion.  CTA HEAD  Anterior circulation: Internal carotid arteries are widely patent through the skullbase. There is atherosclerotic calcification in the siphon regions but no stenosis greater than 30-40%. Supra clinoid internal carotid arteries are widely patent. The anterior and middle cerebral vessels are patent without proximal stenosis, aneurysm or vascular malformation. No missing branches are appreciated.  Posterior circulation: Both vertebral arteries are widely patent through the foramen magnum to the basilar. No basilar stenosis. Posterior circulation branch vessels are patent.  Venous sinuses: Patent and normal  Anatomic variants: None significant  IMPRESSION: No CT or CTA evidence of acute intracranial infarction.  70% stenosis of the proximal right internal carotid artery.  80% stenosis of the proximal left internal carotid artery.  Atherosclerotic disease in the carotid siphon  regions with maximal stenosis estimated at 30-40% on both sides.   Electronically Signed   By: Mark  Shogry M.D.   On: 10/20/2014 20:32   Dg Chest 2 View  10/20/2014   CLINICAL DATA:  Atrial fibrillation.  TIA.  EXAM: CHEST  2 VIEW  COMPARISON:  07/25/2014.  FINDINGS: Cardiac pacer with lead tip projected over right ventricle. Cardiomegaly with normal pulmonary vascularity. Low lung volumes with mild bibasilar atelectasis. No pleural effusion or pneumothorax. Diffuse degenerative change and osteopenia thoracic spine with stable compression fractures.  IMPRESSION: 1. Stable cardiomegaly.  No CHF.  Cardiac pacer stable position. 2. Low lung volumes with mild bibasilar subsegmental atelectasis.   Electronically Signed   By: Thomas  Register   On: 10/20/2014 14:52   Ct Head Wo Contrast  10/20/2014   CLINICAL DATA:  Confusion, slurred speech.  EXAM: CT HEAD WITHOUT CONTRAST  TECHNIQUE: Contiguous axial images were obtained from the base of the skull through the vertex without intravenous contrast.  COMPARISON:  CT scan of September 20, 2014.  FINDINGS: Bony calvarium appears intact. Moderate diffuse cortical atrophy is noted. Diffuse ventricular dilatation is noted consistent with the degree of atrophy. Minimal chronic ischemic white matter disease is noted. No mass effect or midline shift is noted. There is no evidence of mass lesion, hemorrhage or acute infarction.  IMPRESSION: Moderate diffuse cortical atrophy. Minimal chronic ischemic white matter disease. No acute intracranial abnormality seen. These results were called by telephone at the time of interpretation on 10/20/2014 at 9:24 am to Dr. Stewart, who verbally acknowledged these results.   Electronically Signed   By: James  Green Jr, M.D.   On: 10/20/2014 09:25   Ct Angio Neck W/cm &/or Wo/cm  10/20/2014   CLINICAL DATA:  Gait disturbance. Slurred speech. Seizures. Atrial fibrillation. Symptoms began today.  EXAM: CT ANGIOGRAPHY HEAD AND NECK  TECHNIQUE:  Multidetector CT imaging of the head and neck was performed using the standard protocol during bolus administration of intravenous contrast. Multiplanar CT image reconstructions and MIPs were obtained to evaluate the vascular anatomy. Carotid stenosis measurements (when applicable) are obtained utilizing NASCET criteria, using the distal internal carotid diameter as the denominator.  CONTRAST: 14m9m4mDanae OrleansXOL 350 MG/ML SOLN  COMPARISON:  CT earlier same day.  FINDINGS: CT HEAD  Brain shows generalized atrophy with mild chronic small-vessel changes of the white matter. No sign of acute infarction, mass lesion, hemorrhage, hydrocephalus or extra-axial collection.  CTA NECK  Aortic arch: Mild atherosclerosis of the arch without aneurysm or dissection. Branching pattern of the brachiocephalic vessels from the arch is normal without origin stenosis.  Right carotid system: Common carotid artery widely patent to the  bifurcation. There is advanced atherosclerotic disease affecting the proximal internal carotid artery. Minimal diameter in the bulb is 1.5 mm. Compared to a more distal cervical ICA diameter of 5 mm, this indicates 70% stenosis.  Left carotid system: Common carotid artery widely patent to the bifurcation region. Complex atherosclerotic disease of the ICA bulb with minimal diameter of 1 mm. Compared to a more distal cervical ICA diameter of 5 mm, this indicates an 80% stenosis.  Vertebral arteries:The left vertebral artery is dominant. There is 20% stenosis at the origin. The vessel is widely patent beyond that. The nondominant right vertebral artery is widely patent at its origin and through the cervical region.  Skeleton: Ordinary spondylosis  Other neck: No significant soft tissue lesion no upper chest lesion.  CTA HEAD  Anterior circulation: Internal carotid arteries are widely patent through the skullbase. There is atherosclerotic calcification in the siphon regions but no stenosis greater than  30-40%. Supra clinoid internal carotid arteries are widely patent. The anterior and middle cerebral vessels are patent without proximal stenosis, aneurysm or vascular malformation. No missing branches are appreciated.  Posterior circulation: Both vertebral arteries are widely patent through the foramen magnum to the basilar. No basilar stenosis. Posterior circulation branch vessels are patent.  Venous sinuses: Patent and normal  Anatomic variants: None significant  IMPRESSION: No CT or CTA evidence of acute intracranial infarction.  70% stenosis of the proximal right internal carotid artery.  80% stenosis of the proximal left internal carotid artery.  Atherosclerotic disease in the carotid siphon regions with maximal stenosis estimated at 30-40% on both sides.   Electronically Signed   By: Paulina Fusi M.D.   On: 10/20/2014 20:32    Scheduled Meds:  Scheduled Meds: . aspirin  300 mg Rectal Daily   Or  . aspirin  325 mg Oral Daily  . dorzolamide-timolol  1 drop Both Eyes BID  . enoxaparin (LOVENOX) injection  40 mg Subcutaneous Daily  . furosemide  20 mg Oral Daily  . irbesartan  37.5 mg Oral Daily  . latanoprost  1 drop Both Eyes QHS  . levETIRAcetam  250 mg Oral q morning - 10a  . levETIRAcetam  500 mg Oral QHS  . levothyroxine  25 mcg Oral QAC breakfast  . loratadine  10 mg Oral Daily  . metoprolol succinate  25 mg Oral Daily  . multivitamin with minerals  1 tablet Oral Daily  . pravastatin  20 mg Oral q1800  . zolpidem  2.5 mg Oral QHS   Continuous Infusions:   Time spent on care of this patient: 35 min   RIZWAN,SAIMA, MD 10/21/2014, 5:56 PM    [facility] Office  [phone] Pager - Text Page per [email or web address] If 7PM-7AM, please contact night-coverage [email or web address]

## 2014-10-21 NOTE — Progress Notes (Signed)
Mr Johnmatthew Therapy made numerous recommedations for [facility] and equip. If its ok, enter the orders and I will call case management. Thanks

## 2014-10-21 NOTE — Progress Notes (Signed)
EEG Completed; Results Pending  

## 2014-10-21 NOTE — Progress Notes (Signed)
Mr Oscar Santiago ordered me to schedule an appt for this man. Charge nurse says that is not possible on the weekend.

## 2014-10-21 NOTE — Consult Note (Addendum)
Admit date: 10/20/2014 Referring Physician  Dr. Butler Denmark Primary Physician Kristian Covey, MD Primary Cardiologist  Dr. 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 Unable to get MRI because of pacemaker.  Currently he is having no active signs of systolic heart failure. He appears compensated. He has not had any anginal symptoms. No syncope.  He has over the past few years had dizzy spells he states.  His most recent episode occurred when he was trying, slid down to the floor. Could not reach his phone. Had trouble buttoning his shirt. Left side weakness.  I reviewed Dr. Michaelle Copas last office note on 04/07/14.      PMH:   Past Medical History  Diagnosis Date  . Hypertension   . Seizure disorder   . Glaucoma   . Hyperlipidemia   . Osteopenia   . Compression fracture of spine     T 10 and T11  . Gout   . OSA (obstructive sleep apnea)     on CPAP therapy  . Hypothyroid   . Inguinal hernia   . Hemorrhoid   . Chronic systolic dysfunction of left ventricle     EF 30-35% by echo 10/10/2009  . Major depression   . History of GI bleed     from coumadin  . MR (mitral regurgitation)   . Hearing loss   . Dizziness     intermittent  . Kidney stone   . CKD (chronic kidney disease), stage III   . Permanent atrial fibrillation     refuses coumadin due to history of GI bleed  . Decreased appetite 03/02/12  . Weight loss 03/02/12  . Complete heart block   . CHF (congestive heart failure)     PSH:     Past Surgical History  Procedure Laterality Date  . Transurethral resection of prostate    . Cardiac catheterization  2002  . Pacemaker insertion  07/28/06    VVI pacemaker for complete heart block by Dr Amil Amen  . Inguinal hernia repair    . Hemorrhoid surgery    . Colonoscopy with propofol N/A 05/01/2014    Procedure: COLONOSCOPY WITH PROPOFOL;  Surgeon: [name], MD;  Location: [facility];  Service: Endoscopy;  Laterality: N/A;   Allergies:  Coumadin and Lisinopril Prior to Admit Meds:   Prior to Admission medications   Medication Sig Start Date End Date Taking? Authorizing Provider  acetaminophen (TYLENOL) 500 MG tablet Take 500 mg by mouth every 6 (six) hours as needed (pain).   Yes Historical Provider, MD  alendronate (FOSAMAX) 70 MG tablet Take 70 mg by mouth every Monday.  02/05/14  Yes Historical Provider, MD  amLODipine (NORVASC) 10 MG tablet Take 5 mg by mouth daily as needed (diastolic blood pressure over 90).    Yes Historical Provider, MD  aspirin EC 81 MG tablet Take 81 mg by mouth daily.   Yes Historical Provider, MD  cetirizine (ZYRTEC) 10 MG tablet Take 10 mg by mouth daily.   Yes Historical Provider, MD  colchicine 0.6 MG tablet TAKE 1 TABLET TWICE DAILY Patient  taking differently: TAKE 1 TABLET TWICE DAILY AS NEEDED FOR GOUT 10/05/14  Yes Doe-Hyun Sherran Needs, DO  Dextromethorphan-Guaifenesin (CORICIDIN HBP CONGESTION/COUGH) 10-200 MG CAPS Take 1 capsule by mouth daily as needed (for cold).    Yes Historical Provider, MD  dorzolamide-timolol (COSOPT) 22.3-6.8 MG/ML ophthalmic solution Place 1 drop into both eyes 2 (two) times daily. 09/09/14  Yes Historical Provider, MD  famotidine (PEPCID AC) 10 MG chewable tablet Chew 10 mg by mouth daily as needed for heartburn.  07/18/04  Yes Historical Provider, MD  fluticasone (FLONASE) 50 MCG/ACT nasal spray Place 2 sprays into both nostrils daily as needed for allergies or rhinitis. \   Yes Historical Provider, MD  furosemide (LASIX)  20 MG tablet Take 1 tablet (20 mg total) by mouth daily. 04/07/14  Yes Lyn Records, MD  latanoprost (XALATAN) 0.005 % ophthalmic solution Place 1 drop into both eyes at bedtime.  02/05/14  Yes Historical Provider, MD  levETIRAcetam (KEPPRA) 500 MG tablet Take 250-500 mg by mouth 2 (two) times daily. Take 1/2 tablet (250 mg) by mouth every morning and 1 tablet (500 mg) every night   Yes Historical Provider, MD  levothyroxine (SYNTHROID, LEVOTHROID) 25 MCG tablet TAKE 1 TABLET (25 MCG TOTAL) BY MOUTH DAILY BEFORE BREAKFAST. 07/24/14  Yes Doe-Hyun Sherran Needs, DO  Misc Natural Products (BLACK CHERRY CONCENTRATE PO) Take 1 capsule by mouth daily.   Yes Historical Provider, MD  Multiple Vitamin (MULTIVITAMIN WITH MINERALS) TABS tablet Take 1 tablet by mouth daily.   Yes Historical Provider, MD  valsartan (DIOVAN) 320 MG tablet Take 1 tablet (320 mg total) by mouth daily with lunch. 04/07/14  Yes Lyn Records, MD  vitamin B-12 (CYANOCOBALAMIN) 1000 MCG tablet Take 1,000 mcg by mouth daily.   Yes Historical Provider, MD  zolpidem (AMBIEN) 5 MG tablet TAKE 1 TABLET AT BEDTIME Patient taking differently: TAKE 1/2 TABLET BY MOUTH AT BEDTIME 08/25/14  Yes Doe-Hyun R Artist Pais, DO  lacosamide (VIMPAT) 50 MG TABS tablet Take 1 tablet (50 mg total) by mouth 2 (two) times daily. Patient not taking: Reported on 10/20/2014 09/20/14   Charlestine Night, PA-C   Fam HX:    Family History  Problem Relation Age of Onset  . Heart failure Mother   . CVA Sister   . Aneurysm Brother     brain  . Colon cancer Neg Hx   . Colon polyps Neg Hx    Social HX:    Social History   Social History  . Marital Status: Widowed    Spouse Name: N/A  . Number of Children: 6  . Years of Education: N/A   Occupational History  . Retired     Education officer, environmental   Social History Main Topics  . Smoking status: Never Smoker   . Smokeless tobacco: Never Used  . Alcohol Use: No  . Drug Use: No  . Sexual Activity: Not on file   Other Topics Concern  . 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 Katrinka Blazing - Cardiology   Dr. Leone Payor - Gastroenterology   Dr. Brunilda Payor - Urology   [facility]     ROS:  Denies any bleeding, orthopnea, chest pain, fevers, he admits to some memory loss. All 11 ROS were addressed and are negative except  what is stated in the HPI   Physical Exam: Blood pressure 141/71, pulse 61, temperature 98.1 F (36.7 C), temperature source Oral, resp. rate 18, SpO2 100 %.   General: Well developed, well nourished, in no acute distress Head: Eyes PERRLA, No xanthomas.   Normal cephalic and atramatic  Lungs:   Clear bilaterally to auscultation and percussion. Normal respiratory effort. No wheezes, no rales. Heart:   HRRR S1 S2, occasional ectopy Pulses are 2+ & equal. 2/6 systolic apical murmur, rubs, gallops.  No carotid bruit. No JVD.  No abdominal bruits.  Abdomen: Bowel sounds are positive, abdomen soft and non-tender without masses. No hepatosplenomegaly. Msk:  Back normal. Normal strength and tone for age. Extremities:  No clubbing, cyanosis or edema.  DP +1 Neuro: Alert and oriented X 3, non-focal, MAE x 4, no obvious facial droop at this point. No obvious weaknesses. GU: Deferred Rectal: Deferred Psych:  Good affect, responds appropriately      Labs: Lab Results  Component Value Date   WBC 2.9* 10/21/2014   HGB 13.1 10/21/2014   HCT 38.6* 10/21/2014   MCV 84.6 10/21/2014   PLT 126* 10/21/2014     Recent Labs Lab 10/20/14 0903  10/21/14 0713  NA 135  < > 136  K 4.0  < > 3.8  CL 106  < > 105  CO2 21*  --  25  BUN 15  < > 13  CREATININE 1.33*  < > 1.24  CALCIUM 8.9  --  8.6*  PROT 6.8  --   --   BILITOT 0.8  --   --   ALKPHOS 56  --   --   ALT 21  --   --   AST 37  --   --   GLUCOSE 94  < > 94  < > = values in this interval not displayed. No results  for input(s): CKTOTAL, CKMB, TROPONINI in the last 72 hours. Lab Results  Component Value Date   CHOL 159 10/21/2014   HDL 43 10/21/2014   LDLCALC 105* 10/21/2014   TRIG 55 10/21/2014   No results found for: DDIMER   Radiology:  Ct Angio Head W/cm &/or Wo Cm  10/20/2014   CLINICAL DATA:  Gait disturbance. Slurred speech. Seizures. Atrial fibrillation. Symptoms began today.  EXAM: CT ANGIOGRAPHY HEAD AND NECK  TECHNIQUE: Multidetector CT imaging of the head and neck was performed using the standard protocol during bolus administration of intravenous contrast. Multiplanar CT image reconstructions and MIPs were obtained to evaluate the vascular anatomy. Carotid stenosis measurements (when applicable) are obtained utilizing NASCET criteria, using the distal internal carotid diameter as the denominator.  CONTRAST:  50mL OMNIPAQUE IOHEXOL 350 MG/ML SOLN  COMPARISON:  CT earlier same day.  FINDINGS: CT HEAD  Brain shows generalized atrophy with mild chronic small-vessel changes of the white matter. No sign of acute infarction, mass lesion, hemorrhage, hydrocephalus or extra-axial collection.  CTA NECK  Aortic arch: Mild atherosclerosis of the arch without aneurysm or dissection. Branching pattern of the brachiocephalic vessels from the arch is normal without origin stenosis.  Right carotid system: Common carotid artery widely patent to the bifurcation. There is advanced atherosclerotic disease affecting the proximal internal carotid artery. Minimal diameter in the bulb is 1.5 mm. Compared to a more distal cervical ICA diameter of 5 mm, this indicates 70% stenosis.  Left carotid system: Common carotid artery widely patent to the bifurcation region. Complex atherosclerotic disease of the ICA bulb with minimal diameter  of 1 mm. Compared to a more distal cervical ICA diameter of 5 mm, this indicates an 80% stenosis.  Vertebral arteries:The left vertebral artery is dominant. There is 20% stenosis at the origin. The  vessel is widely patent beyond that. The nondominant right vertebral artery is widely patent at its origin and through the cervical region.  Skeleton: Ordinary spondylosis  Other neck: No significant soft tissue lesion no upper chest lesion.  CTA HEAD  Anterior circulation: Internal carotid arteries are widely patent through the skullbase. There is atherosclerotic calcification in the siphon regions but no stenosis greater than 30-40%. Supra clinoid internal carotid arteries are widely patent. The anterior and middle cerebral vessels are patent without proximal stenosis, aneurysm or vascular malformation. No missing branches are appreciated.  Posterior circulation: Both vertebral arteries are widely patent through the foramen magnum to the basilar. No basilar stenosis. Posterior circulation branch vessels are patent.  Venous sinuses: Patent and normal  Anatomic variants: None significant  IMPRESSION: No CT or CTA evidence of acute intracranial infarction.  70% stenosis of the proximal right internal carotid artery.  80% stenosis of the proximal left internal carotid artery.  Atherosclerotic disease in the carotid siphon regions with maximal stenosis estimated at 30-40% on both sides.   Electronically Signed   By: Paulina Fusi M.D.   On: 10/20/2014 20:32   Dg Chest 2 View  10/20/2014   CLINICAL DATA:  Atrial fibrillation.  TIA.  EXAM: CHEST  2 VIEW  COMPARISON:  07/25/2014.  FINDINGS: Cardiac pacer with lead tip projected over right ventricle. Cardiomegaly with normal pulmonary vascularity. Low lung volumes with mild bibasilar atelectasis. No pleural effusion or pneumothorax. Diffuse degenerative change and osteopenia thoracic spine with stable compression fractures.  IMPRESSION: 1. Stable cardiomegaly.  No CHF.  Cardiac pacer stable position. 2. Low lung volumes with mild bibasilar subsegmental atelectasis.   Electronically Signed   By: Maisie Fus  Register   On: 10/20/2014 14:52   Ct Head Wo Contrast  10/20/2014    CLINICAL DATA:  Confusion, slurred speech.  EXAM: CT HEAD WITHOUT CONTRAST  TECHNIQUE: Contiguous axial images were obtained from the base of the skull through the vertex without intravenous contrast.  COMPARISON:  CT scan of September 20, 2014.  FINDINGS: Bony calvarium appears intact. Moderate diffuse cortical atrophy is noted. Diffuse ventricular dilatation is noted consistent with the degree of atrophy. Minimal chronic ischemic white matter disease is noted. No mass effect or midline shift is noted. There is no evidence of mass lesion, hemorrhage or acute infarction.  IMPRESSION: Moderate diffuse cortical atrophy. Minimal chronic ischemic white matter disease. No acute intracranial abnormality seen. These results were called by telephone at the time of interpretation on 10/20/2014 at 9:24 am to Dr. Roseanne Reno, who verbally acknowledged these results.   Electronically Signed   By: Lupita Raider, M.D.   On: 10/20/2014 09:25   Ct Angio Neck W/cm &/or Wo/cm  10/20/2014   CLINICAL DATA:  Gait disturbance. Slurred speech. Seizures. Atrial fibrillation. Symptoms began today.  EXAM: CT ANGIOGRAPHY HEAD AND NECK  TECHNIQUE: Multidetector CT imaging of the head and neck was performed using the standard protocol during bolus administration of intravenous contrast. Multiplanar CT image reconstructions and MIPs were obtained to evaluate the vascular anatomy. Carotid stenosis measurements (when applicable) are obtained utilizing NASCET criteria, using the distal internal carotid diameter as the denominator.  CONTRAST:  50mL OMNIPAQUE IOHEXOL 350 MG/ML SOLN  COMPARISON:  CT earlier same day.  FINDINGS: CT HEAD  Brain shows generalized atrophy with mild chronic small-vessel changes of the white matter. No sign of acute infarction, mass lesion, hemorrhage, hydrocephalus or extra-axial collection.  CTA NECK  Aortic arch: Mild atherosclerosis of the arch without aneurysm or dissection. Branching pattern of the brachiocephalic  vessels from the arch is normal without origin stenosis.  Right carotid system: Common carotid artery widely patent to the bifurcation. There is advanced atherosclerotic disease affecting the proximal internal carotid artery. Minimal diameter in the bulb is 1.5 mm. Compared to a more distal cervical ICA diameter of 5 mm, this indicates 70% stenosis.  Left carotid system: Common carotid artery widely patent to the bifurcation region. Complex atherosclerotic disease of the ICA bulb with minimal diameter of 1 mm. Compared to a more distal cervical ICA diameter of 5 mm, this indicates an 80% stenosis.  Vertebral arteries:The left vertebral artery is dominant. There is 20% stenosis at the origin. The vessel is widely patent beyond that. The nondominant right vertebral artery is widely patent at its origin and through the cervical region.  Skeleton: Ordinary spondylosis  Other neck: No significant soft tissue lesion no upper chest lesion.  CTA HEAD  Anterior circulation: Internal carotid arteries are widely patent through the skullbase. There is atherosclerotic calcification in the siphon regions but no stenosis greater than 30-40%. Supra clinoid internal carotid arteries are widely patent. The anterior and middle cerebral vessels are patent without proximal stenosis, aneurysm or vascular malformation. No missing branches are appreciated.  Posterior circulation: Both vertebral arteries are widely patent through the foramen magnum to the basilar. No basilar stenosis. Posterior circulation branch vessels are patent.  Venous sinuses: Patent and normal  Anatomic variants: None significant  IMPRESSION: No CT or CTA evidence of acute intracranial infarction.  70% stenosis of the proximal right internal carotid artery.  80% stenosis of the proximal left internal carotid artery.  Atherosclerotic disease in the carotid siphon regions with maximal stenosis estimated at 30-40% on both sides.   Electronically Signed   By: Paulina Fusi M.D.   On: 10/20/2014 20:32   Personally viewed.  EKG:  10/20/14-V paced, occasional intrinsic beat, atrial fibrillation underlying, no significant change from prior. Personally viewed.   Echo: 10/20/14 - Left ventricle: Hypokinesis of the anterior wall and the anterior septum. The cavity size was normal. Wall thickness was increased in a pattern of mild LVH. The estimated ejection fraction was 40%. - Mitral valve: There was moderate regurgitation. - Left atrium: The atrium was severely dilated. - Right ventricle: The cavity size was mildly dilated. Systolic function was mildly reduced. - Right atrium: The atrium was mildly dilated. - Tricuspid valve: There was moderate regurgitation. - Pulmonary arteries: PA peak pressure: 35 mm Hg (S). - Impressions: No cardiac source of embolism was identified, but cannot be ruled out on the basis of this examination.  Scheduled Meds: . aspirin  300 mg Rectal Daily   Or  . aspirin  325 mg Oral Daily  . dorzolamide-timolol  1 drop Both Eyes BID  . enoxaparin (LOVENOX) injection  40 mg Subcutaneous Daily  . furosemide  20 mg Oral Daily  . irbesartan  37.5 mg Oral Daily  . latanoprost  1 drop Both Eyes QHS  . levETIRAcetam  250 mg Oral q morning - 10a  . levETIRAcetam  500 mg Oral QHS  . levothyroxine  25 mcg Oral QAC breakfast  . loratadine  10 mg Oral Daily  . multivitamin with minerals  1 tablet Oral Daily  .  zolpidem  2.5 mg Oral QHS   Continuous Infusions:  PRN Meds:.amLODipine, fluticasone   ASSESSMENT/PLAN:    81 year old male with chronic atrial fibrillation, not on anticoagulation, pacemaker with chronic systolic left ventricular dysfunction, ejection fraction of 40% here with strokelike symptoms, left hemiplegia currently resolved with bilateral carotid stenosis of 70-80%.  1. Preoperative risk stratification  - He does have left ventricular systolic dysfunction however he is well compensated, no crackles on lung  exam, no significant JVD, no shortness of breath with activity. He also does not have any anginal symptoms. No chest pain. He has atrial fibrillation but this is well rate controlled, with underlying pacing. He has not demonstrated any dangerous or adverse arrhythmias. No syncope.  - I do believe that he would be of moderate risk from a cardiac perspective for carotid endarterectomy and he may proceed without any further cardiac testing. Echo done on this admission demonstrates EF of 40%.  - He understands as well as his family in room, brother and granddaughter that given his advanced age and prior cardiac systolic dysfunction, there is a risk for myocardial infarction with any level of general anesthesia. If carotid endarterectomy will be helpful for him, they are willing to proceed. 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 I do not see any cardiac contraindication to surgery. Be careful with fluid status. Continue with low-dose Lasix.  2. Atrial fibrillation  - No anticoagulation because of prior GI bleed.  - Continue with rate control.  - Aspirin  3. Left ventricular systolic dysfunction  - Currently on low-dose angiotensin receptor blocker.  - I will add low-dose metoprolol succinate 25 mg once a day. Pacemaker for backup. Blood pressure should be able to tolerate.  - Continue with low-dose Lasix.  4. Stroke like symptoms with severe carotid artery disease  - Vascular surgery consult did by primary team.  5. Mitral regurgitation-moderate.  6. Pacemaker-functioning well.  Please call us if there are any further questions. I discussed his case with Dr. Katrinka Blazing.  Donato Schultz, MD  10/21/2014  1:37 PM

## 2014-10-21 NOTE — Progress Notes (Signed)
PT Note Pt will need RW, HHAide (Respite care if qualifies), HHPT and HHOT.  Pt and granddaughter agree.  Full note to follow.  Thanks.  [facility] [phone] (pager)

## 2014-10-21 NOTE — Evaluation (Signed)
Physical Therapy Evaluation Patient Details Name: Oscar Santiago MRN: [id number] DOB: 1925-05-23 Today's Date: 10/21/2014   History of Present Illness  Oscar Santiago is an 81 y.o. male presenting to the hospital. Patient awoke at 0700 hours and felt normal. He went to put on his shirt and noted his left arm was week and then fell due to his left leg giving out. EMS was called to [facility] for a fall but on arrival noted he had left sided plegia and facial droop. He was also noted to have dysarthria. On arrival to ED his dysarthria resolved but continued to have left arm weakness but NOT plegia. He only had a left arm drift.   Clinical Impression  Pt admitted with above diagnosis. Pt currently with functional limitations due to the deficits listed below (see PT Problem List). Pt able to ambulate with some safety issues but does well with RW overall.  Granddaughter lining up 24 hour care between neighbors, friends and family and is investigating what insurance covers to help with this.  HHPT safety eval recommended.  Will follow acutely.   Pt will benefit from skilled PT to increase their independence and safety with mobility to allow discharge to the venue listed below.      Follow Up Recommendations Home health PT;Supervision/Assistance - 24 hour (HHOT, HHAide, REspite care if pt has benefits)    Equipment Recommendations  Rolling walker with 5" wheels    Recommendations for Other Services       Precautions / Restrictions Precautions Precautions: Fall Restrictions Weight Bearing Restrictions: No      Mobility  Bed Mobility Overal bed mobility: Independent                Transfers Overall transfer level: Independent Equipment used: None                Ambulation/Gait Ambulation/Gait assistance: Supervision Ambulation Distance (Feet): 300 Feet Assistive device: Rolling walker (2 wheeled);None Gait Pattern/deviations: Step-through pattern;Decreased stride  length;Wide base of support;Trunk flexed   Gait velocity interpretation: <1.8 ft/sec, indicative of risk for recurrent falls General Gait Details: Pt ambulated without device with shortened stride length and decr cadence.  No significant LOB with some challenges to balance however pt slightly unsteady on feet at times.  Pt then used RW and did very well with exception of needing occasional cues to stay close to RW.  Pt and granddaughter and brother discussed home situation upon returning to room   Apparently pt can use RW in most of home but may have to use cane in living area.  Living area is more crowded however pt can use cane in there.  It was decided that pt can use RW in most of house and cane in living area and [facility] f/u would be arranged to address safety in home as well as grandaughter is going to incr caregivers in the home as well.   Stairs            Wheelchair Mobility    Modified Rankin (Stroke Patients Only) Modified Rankin (Stroke Patients Only) Pre-Morbid Rankin Score: Slight disability Modified Rankin: Moderately severe disability     Balance Overall balance assessment: Needs assistance         Standing balance support: No upper extremity supported;During functional activity Standing balance-Leahy Scale: Fair Standing balance comment: can stand statically without physical assist. Prefers single UE support for balance.              High level balance  activites: Direction changes;Turns;Sudden stops High Level Balance Comments: Can do above with A device with supervision.  Also can perform without device with min guard asssit.              Pertinent Vitals/Pain Pain Assessment: No/denies pain  VSS    Home Living Family/patient expects to be discharged to:: Private residence Living Arrangements: Alone Available Help at Discharge: Family;Available PRN/intermittently Type of Home: House Home Access: Ramped entrance     Home Layout: Two level;Laundry or  work area in basement;Able to live on main level with bedroom/bathroom Home Equipment: Gilmer Mor - single point;Bedside commode Additional Comments: Getting lift chair    Prior Function Level of Independence: Independent with assistive device(s)         Comments: used cane outdoors     Hand Dominance   Dominant Hand: Right    Extremity/Trunk Assessment   Upper Extremity Assessment: Defer to OT evaluation           Lower Extremity Assessment: Generalized weakness      Cervical / Trunk Assessment: Normal  Communication   Communication: HOH  Cognition Arousal/Alertness: Awake/alert Behavior During Therapy: WFL for tasks assessed/performed Overall Cognitive Status: Within Functional Limits for tasks assessed                      General Comments      Exercises        Assessment/Plan    PT Assessment Patient needs continued PT services  PT Diagnosis Generalized weakness   PT Problem List Decreased activity tolerance;Decreased balance;Decreased mobility;Decreased knowledge of use of DME;Decreased knowledge of precautions;Decreased safety awareness  PT Treatment Interventions DME instruction;Gait training;Functional mobility training;Therapeutic activities;Balance training;Therapeutic exercise;Patient/family education   PT Goals (Current goals can be found in the Care Plan section) Acute Rehab PT Goals Patient Stated Goal: to gohome PT Goal Formulation: With patient Time For Goal Achievement: 11/04/14 Potential to Achieve Goals: Good    Frequency Min 3X/week   Barriers to discharge Decreased caregiver support (family trying to arrange incr care at least initially)      Co-evaluation               End of Session Equipment Utilized During Treatment: Gait belt Activity Tolerance: Patient limited by fatigue Patient left: in chair;with call bell/phone within reach;with family/visitor present;with chair alarm set Nurse Communication: Mobility  status    Functional Assessment Tool Used: clinical judgement Functional Limitation: Mobility: Walking and moving around Mobility: Walking and Moving Around Current Status [phone]): At least 1 percent but less than 20 percent impaired, limited or restricted Mobility: Walking and Moving Around Goal Status [phone]): 0 percent impaired, limited or restricted    Time: 1211-1242 PT Time Calculation (min) (ACUTE ONLY): 31 min   Charges:   PT Evaluation $Initial PT Evaluation Tier I: 1 Procedure PT Treatments $Gait Training: 8-22 mins   PT G Codes:   PT G-Codes **NOT FOR INPATIENT CLASS** Functional Assessment Tool Used: clinical judgement Functional Limitation: Mobility: Walking and moving around Mobility: Walking and Moving Around Current Status (W2956): At least 1 percent but less than 20 percent impaired, limited or restricted Mobility: Walking and Moving Around Goal Status [phone]): 0 percent impaired, limited or restricted    Berline Lopes 10/21/2014, 3:45 PM Dawn White,PT Acute Rehabilitation [phone] (pager)

## 2014-10-21 NOTE — Procedures (Signed)
ELECTROENCEPHALOGRAM REPORT  Patient: [name]       Room #: 5M20 EEG No. ID: [id number] Age: 81 y.o.        Sex: male Referring Physician: Karlyne Greenspan Report Date:  10/21/2014        Interpreting Physician: Aline Brochure  History: Oscar Santiago is an 81 y.o. male with a history of atrial fibrillation and hypertension, hyperlipidemia and easily diagnosed partial seizure disorder, admitted on 10/20/2014 for transient each difficulty and hemiparesis.  Indications for study:  Rule out focal seizure activity.  Technique: This is an 18 channel routine scalp EEG performed at the bedside with bipolar and monopolar montages arranged in accordance to the international 10/20 system of electrode placement.   Description: This EEG recording was performed during wakefulness. The dominant background activity consisted of 8 Hz rhythmic heavily recorded from the posterior head region with good attenuation with eye opening. There was also low amplitude beta activity recorded from the frontal and central regions. Photic stimulation and hyperventilation were not performed. No epileptiform discharges were recorded.  Interpretation: This is a normal EEG recording during wakefulness. No evidence of EEG activity was demonstrated.   Venetia Maxon M.D. [facility] [phone]

## 2014-10-21 NOTE — Progress Notes (Signed)
Patient placed himself on his home CPAP unit. No O2 bleed in needed. RT will continue to monitor as needed.  

## 2014-10-21 NOTE — Progress Notes (Addendum)
STROKE TEAM PROGRESS NOTE  HPI Oscar Santiago is an 81 y.o. male presenting to the hospital. Patient awoke at 0700 hours and felt normal. He went to put on his shirt and noted his left arm was week and then fell due to his left leg giving out. EMS was called to [facility] for a fall but on arrival noted he had left sided plegia and facial droop. He was also noted to have dysarthria. On arrival to ED his dysarthria resolved but continued to have left arm weakness but NOT plegia. He only had a left arm drift.   Date last known well: Date: 10/20/2014 Time last known well: Time: 07:00 tPA Given: No: significant improvement since arrived in hospital  SUBJECTIVE (INTERVAL HISTORY) His brother and daughter are at the bedside.  Overall he feels his condition is completely resolved. He has afib not on AC due to brief GIB in 1991 with coumadin, complete heart block on pacemaker, partial seizure on keppra. He was here in 09/2014 and was advised to be on keppra and vimpat but pt not on vimpat as daughter said once he was on two AEDs and he was drowsy sleepy. CTA head and neck this time showed left 80% and right 70% stenosis. Daughter stated that all his partial seizures happened while he is sitting.   OBJECTIVE Temp:  [98.6 F (37 C)-98.7 F (37.1 C)] 98.7 F (37.1 C) (09/16 1743) Pulse Rate:  [51-74] 64 (09/16 1743) Cardiac Rhythm:  [-] Ventricular paced (09/16 2115) Resp:  [15-29] 18 (09/16 1743) BP: (140-179)/(57-91) 152/65 mmHg (09/16 1743) SpO2:  [100 %] 100 % (09/16 1743)  CBC:  Recent Labs Lab 10/20/14 0903 10/20/14 0909  WBC 2.7*  --   NEUTROABS 1.0*  --   HGB 13.3 15.0  HCT 38.7* 44.0  MCV 84.1  --   PLT 124*  --     Basic Metabolic Panel:  Recent Labs Lab 10/20/14 0903 10/20/14 0909  NA 135 138  K 4.0 3.9  CL 106 103  CO2 21*  --   GLUCOSE 94 93  BUN 15 17  CREATININE 1.33* 1.40*  CALCIUM 8.9  --     Lipid Panel: No results found for: CHOL, TRIG, HDL, CHOLHDL, VLDL,  LDLCALC HgbA1c: No results found for: HGBA1C Urine Drug Screen: No results found for: LABOPIA, COCAINSCRNUR, LABBENZ, AMPHETMU, THCU, LABBARB    IMAGING  Ct Angio Head and Neck W/cm &/or Wo Cm 10/20/2014    No CT or CTA evidence of acute intracranial infarction.  70% stenosis of the proximal right internal carotid artery.  80% stenosis of the proximal left internal carotid artery.  Atherosclerotic disease in the carotid siphon regions with maximal stenosis estimated at 30-40% on both sides.     Dg Chest 2 View 10/20/2014    1. Stable cardiomegaly.  No CHF.  Cardiac pacer stable position.  2. Low lung volumes with mild bibasilar subsegmental atelectasis.      Ct Head Wo Contrast 10/20/2014    Moderate diffuse cortical atrophy. Minimal chronic ischemic white matter disease. No acute intracranial abnormality seen.   2D echo - - Left ventricle: Hypokinesis of the anterior wall and the anterior septum. The cavity size was normal. Wall thickness was increased in a pattern of mild LVH. The estimated ejection fraction was 40%. - Mitral valve: There was moderate regurgitation. - Left atrium: The atrium was severely dilated. - Right ventricle: The cavity size was mildly dilated. Systolic function was mildly reduced. - Right  atrium: The atrium was mildly dilated. - Tricuspid valve: There was moderate regurgitation. - Pulmonary arteries: PA peak pressure: 35 mm Hg (S). - Impressions: No cardiac source of embolism was identified, but cannot be ruled out on the basis of this examination. Impressions: - No cardiac source of embolism was identified, but cannot be ruled out on the basis of this examination.  EEG - pending   PHYSICAL EXAM Temp:  [98.1 F (36.7 C)-98.7 F (37.1 C)] 98.1 F (36.7 C) (09/17 0955) Pulse Rate:  [51-64] 61 (09/17 0955) Resp:  [16-18] 18 (09/17 0955) BP: (140-152)/(57-71) 141/71 mmHg (09/17 0955) SpO2:  [100 %] 100 % (09/17 0955)  General - Well  nourished, well developed, in no apparent distress, sitting in chair.  Ophthalmologic - Fundi not visualized due to eye movement.  Cardiovascular - irregularly heart rate and rhythm with either afib rhythm or premature beats.  Mental Status -  Level of arousal and orientation to time, place, and person were intact. Language including expression, naming, repetition, comprehension was assessed and found intact. Fund of Knowledge was assessed and was impaired.  Cranial Nerves II - XII - II - Visual field intact OU. III, IV, VI - Extraocular movements intact. V - Facial sensation intact bilaterally. VII - Facial movement intact bilaterally. VIII - Hearing & vestibular intact bilaterally. X - Palate elevates symmetrically. XI - Chin turning & shoulder shrug intact bilaterally. XII - Tongue protrusion intact.  Motor Strength - The patient's strength was normal in all extremities and pronator drift was absent.  Bulk was normal and fasciculations were absent.   Motor Tone - Muscle tone was assessed at the neck and appendages and was normal.  Reflexes - The patient's reflexes were 1+ in all extremities and he had no pathological reflexes.  Sensory - Light touch, temperature/pinprick were assessed and were symmetrical.    Coordination - The patient had normal movements in the hands and feet with no ataxia or dysmetria.  Tremor was absent.  Gait and Station - deferred to PT who was in room.  ASSESSMENT/PLAN Oscar Santiago is a 81 y.o. male with history of HTN, HLD, OSA on CPAP, Afib on ASA, heart block on pacemaker, partial seizure on keppra  presenting with left arm and leg weakness. He did not receive IV t-PA due to rapid improving. Symptoms not resolved.    TIA vs. Stroke - right brain symptoms, could be due to afib not on AC vs. Large vessel athero due to bilateral ICA high grade stenosis.  Resultant  Resolution of deficit  CT repeat on acute abnormalities  CTA head and neck -  right ICA 70% and left ICA 80% stenosis. Calcification at b/l supraclinoid ICAs  2D Echo  EF 40%, severe dilated LA.   LDL 105  HgbA1c pending  lovenox for VTE prophylaxis  Diet Heart Room service appropriate?: Yes; Fluid consistency:: Thin  aspirin 81 mg orally every day prior to admission, now on aspirin 325 mg orally every day. Discussed with pt and daughter, they are OK with eliquis  bid for stroke prevention due to afib. However, would like to consider after vascular surgery evaluation.  Patient counseled to be compliant with his antithrombotic medications  Ongoing aggressive stroke risk factor management  Therapy recommendations:  pending  Disposition:  Pending  Carotid artery high grade stenosis  Right ICA 70% and left ICA 80% stenosis  Recommend vascular surgery consultation for consideration of right CEA   Hold of anticoagulation for now after  VVS eval.  From stroke standpoint, pt is ok to have CEA within two weeks.  Afib not on AC  Hx of Afib on coumadin but stopped in 1991 due to brief GIB  Pt and family is OK with NOACs especially eliquis   Recommend eliquis 5mg  bid for stroke prevention due to afib. However, would like to consider after vascular surgery evaluation.         Condition Points   C CHF (or Left ventricular systolic dysfunction) 1   H HTN: BP consistently above 140/90 mmHg (or treated HTN on meds)  1   A2 Age ?75 years 2   D DM 0   S2 Prior Stroke or TIA or thromboembolism 2   V Vascular disease (e.g. PAD, MI, aortic plaque) 0   A Age 47-74 years 0   [place] male sex  0           Annual Stroke Risk CHA2DS2-VASc Score Stroke Risk % 95% CI  0 0  1 1.3  2 2.2  3 3.2  4 4.0  5 6.7  6 9.8  7 9.6  8 12.5  9 15.2      Hypertension  Stable Permissive hypertension (OK if < 220/120) but gradually normalize in 5-7 days Avoid hypotension due to carotid stenosis   Hyperlipidemia  Home meds: none  LDL 105, goal < 70  Add  pravastatin 20mg   Continue statin at discharge  Hx of seizure, partial  Follows up with neurology in [facility]  On keepra 250/500  Was recommended to add vimpat by Dr. Thad Ranger in 09/2014 but family does not add new AED due to concerns that pt would be very drowsy and not able to function by family members.  Ok to continue keppra 250/500 for now  Follow up with neurology in [facility]  EEG pending, last EEG in 06/2014 in [facility] was normal  Other Stroke Risk Factors  Advanced age  Obstructive sleep apnea, on CPAP at home  Other Active Problems  CHB on pacemaker  Hospital day # 1  Marvel Plan, MD PhD Stroke Neurology 10/21/2014 2:17 PM      To contact Stroke Continuity provider, please refer to [email or web address]. After hours, contact General Neurology

## 2014-10-22 ENCOUNTER — Other Ambulatory Visit: Payer: Self-pay | Admitting: Interventional Cardiology

## 2014-10-22 DIAGNOSIS — G459 Transient cerebral ischemic attack, unspecified: Secondary | ICD-10-CM

## 2014-10-22 DIAGNOSIS — R338 Other retention of urine: Secondary | ICD-10-CM | POA: Diagnosis not present

## 2014-10-22 DIAGNOSIS — I348 Other nonrheumatic mitral valve disorders: Secondary | ICD-10-CM | POA: Diagnosis not present

## 2014-10-22 DIAGNOSIS — E039 Hypothyroidism, unspecified: Secondary | ICD-10-CM | POA: Diagnosis not present

## 2014-10-22 DIAGNOSIS — N401 Enlarged prostate with lower urinary tract symptoms: Secondary | ICD-10-CM | POA: Diagnosis not present

## 2014-10-22 DIAGNOSIS — G458 Other transient cerebral ischemic attacks and related syndromes: Secondary | ICD-10-CM | POA: Diagnosis not present

## 2014-10-22 DIAGNOSIS — M109 Gout, unspecified: Secondary | ICD-10-CM | POA: Diagnosis not present

## 2014-10-22 DIAGNOSIS — Z7982 Long term (current) use of aspirin: Secondary | ICD-10-CM | POA: Diagnosis not present

## 2014-10-22 DIAGNOSIS — R310 Gross hematuria: Secondary | ICD-10-CM | POA: Diagnosis not present

## 2014-10-22 DIAGNOSIS — M6289 Other specified disorders of muscle: Secondary | ICD-10-CM | POA: Diagnosis not present

## 2014-10-22 DIAGNOSIS — N183 Chronic kidney disease, stage 3 (moderate): Secondary | ICD-10-CM | POA: Diagnosis not present

## 2014-10-22 DIAGNOSIS — R319 Hematuria, unspecified: Secondary | ICD-10-CM | POA: Diagnosis not present

## 2014-10-22 DIAGNOSIS — I5022 Chronic systolic (congestive) heart failure: Secondary | ICD-10-CM | POA: Diagnosis present

## 2014-10-22 DIAGNOSIS — G4733 Obstructive sleep apnea (adult) (pediatric): Secondary | ICD-10-CM | POA: Diagnosis not present

## 2014-10-22 DIAGNOSIS — I6523 Occlusion and stenosis of bilateral carotid arteries: Secondary | ICD-10-CM | POA: Diagnosis present

## 2014-10-22 DIAGNOSIS — D696 Thrombocytopenia, unspecified: Secondary | ICD-10-CM

## 2014-10-22 DIAGNOSIS — I6521 Occlusion and stenosis of right carotid artery: Secondary | ICD-10-CM | POA: Diagnosis not present

## 2014-10-22 DIAGNOSIS — G8194 Hemiplegia, unspecified affecting left nondominant side: Secondary | ICD-10-CM | POA: Diagnosis present

## 2014-10-22 DIAGNOSIS — Z79899 Other long term (current) drug therapy: Secondary | ICD-10-CM | POA: Diagnosis not present

## 2014-10-22 DIAGNOSIS — G40909 Epilepsy, unspecified, not intractable, without status epilepticus: Secondary | ICD-10-CM | POA: Diagnosis not present

## 2014-10-22 DIAGNOSIS — Z950 Presence of cardiac pacemaker: Secondary | ICD-10-CM | POA: Diagnosis not present

## 2014-10-22 DIAGNOSIS — D72819 Decreased white blood cell count, unspecified: Secondary | ICD-10-CM | POA: Diagnosis not present

## 2014-10-22 DIAGNOSIS — M4854XA Collapsed vertebra, not elsewhere classified, thoracic region, initial encounter for fracture: Secondary | ICD-10-CM | POA: Diagnosis not present

## 2014-10-22 DIAGNOSIS — G451 Carotid artery syndrome (hemispheric): Secondary | ICD-10-CM | POA: Diagnosis not present

## 2014-10-22 DIAGNOSIS — I482 Chronic atrial fibrillation: Secondary | ICD-10-CM | POA: Diagnosis not present

## 2014-10-22 DIAGNOSIS — Z888 Allergy status to other drugs, medicaments and biological substances status: Secondary | ICD-10-CM | POA: Diagnosis not present

## 2014-10-22 DIAGNOSIS — W1830XA Fall on same level, unspecified, initial encounter: Secondary | ICD-10-CM | POA: Diagnosis not present

## 2014-10-22 DIAGNOSIS — H409 Unspecified glaucoma: Secondary | ICD-10-CM | POA: Diagnosis not present

## 2014-10-22 DIAGNOSIS — H9190 Unspecified hearing loss, unspecified ear: Secondary | ICD-10-CM | POA: Diagnosis not present

## 2014-10-22 DIAGNOSIS — I10 Essential (primary) hypertension: Secondary | ICD-10-CM | POA: Diagnosis not present

## 2014-10-22 DIAGNOSIS — E785 Hyperlipidemia, unspecified: Secondary | ICD-10-CM | POA: Diagnosis not present

## 2014-10-22 DIAGNOSIS — Z7983 Long term (current) use of bisphosphonates: Secondary | ICD-10-CM | POA: Diagnosis not present

## 2014-10-22 DIAGNOSIS — Y92009 Unspecified place in unspecified non-institutional (private) residence as the place of occurrence of the external cause: Secondary | ICD-10-CM | POA: Diagnosis not present

## 2014-10-22 DIAGNOSIS — I129 Hypertensive chronic kidney disease with stage 1 through stage 4 chronic kidney disease, or unspecified chronic kidney disease: Secondary | ICD-10-CM | POA: Diagnosis not present

## 2014-10-22 DIAGNOSIS — R471 Dysarthria and anarthria: Secondary | ICD-10-CM | POA: Diagnosis not present

## 2014-10-22 LAB — CBC WITH DIFFERENTIAL/PLATELET
BASOS ABS: 0 10*3/uL (ref 0.0–0.1)
BASOS PCT: 1 %
EOS ABS: 0.3 10*3/uL (ref 0.0–0.7)
Eosinophils Relative: 9 %
HCT: 37.1 % — ABNORMAL LOW (ref 39.0–52.0)
Hemoglobin: 12.6 g/dL — ABNORMAL LOW (ref 13.0–17.0)
Lymphocytes Relative: 48 %
Lymphs Abs: 1.4 10*3/uL (ref 0.7–4.0)
MCH: 29 pg (ref 26.0–34.0)
MCHC: 34 g/dL (ref 30.0–36.0)
MCV: 85.3 fL (ref 78.0–100.0)
MONO ABS: 0.3 10*3/uL (ref 0.1–1.0)
Monocytes Relative: 10 %
NEUTROS ABS: 0.9 10*3/uL — AB (ref 1.7–7.7)
NEUTROS PCT: 32 %
PLATELETS: 119 10*3/uL — AB (ref 150–400)
RBC: 4.35 MIL/uL (ref 4.22–5.81)
RDW: 15.9 % — ABNORMAL HIGH (ref 11.5–15.5)
WBC: 2.9 10*3/uL — ABNORMAL LOW (ref 4.0–10.5)

## 2014-10-22 LAB — SURGICAL PCR SCREEN
MRSA, PCR: NEGATIVE
STAPHYLOCOCCUS AUREUS: NEGATIVE

## 2014-10-22 MED ORDER — CEFAZOLIN SODIUM 1-5 GM-% IV SOLN
1.0000 g | INTRAVENOUS | Status: AC
  Administered 2014-10-23: 1 g via INTRAVENOUS
  Filled 2014-10-22: qty 50

## 2014-10-22 NOTE — Progress Notes (Signed)
STROKE TEAM PROGRESS NOTE  HPI Oscar Santiago is an 81 y.o. male presenting to the hospital. Patient awoke at 0700 hours and felt normal. He went to put on his shirt and noted his left arm was week and then fell due to his left leg giving out. EMS was called to [facility] for a fall but on arrival noted he had left sided plegia and facial droop. He was also noted to have dysarthria. On arrival to ED his dysarthria resolved but continued to have left arm weakness but NOT plegia. He only had a left arm drift.   Date last known well: Date: 10/20/2014 Time last known well: Time: 07:00 tPA Given: No: significant improvement since arrived in hospital  SUBJECTIVE (INTERVAL HISTORY) His brother and daughter are at the bedside.  Overall he feels his condition is completely resolved. He has afib not on AC due to brief GIB in 1991 with coumadin, complete heart block on pacemaker, partial seizure on keppra. He was here in 09/2014 and was advised to be on keppra and vimpat but pt not on vimpat as daughter said once he was on two AEDs and he was drowsy sleepy. CTA head and neck this time showed left 80% and right 70% stenosis. Daughter stated that all his partial seizures happened while he is sitting.   OBJECTIVE Temp:  [97 F (36.1 C)-98.1 F (36.7 C)] 97 F (36.1 C) (09/18 0956) Pulse Rate:  [60-118] 110 (09/18 0956) Cardiac Rhythm:  [-] Ventricular paced;Bundle branch block (09/18 0700) Resp:  [18] 18 (09/18 0956) BP: (131-150)/(61-74) 134/63 mmHg (09/18 0956) SpO2:  [100 %] 100 % (09/18 0956) FiO2 (%):  [21 %] 21 % (09/17 2322) Weight:  [85.333 kg (188 lb 2 oz)] 85.333 kg (188 lb 2 oz) (09/18 0725)  CBC:  Recent Labs Lab 10/20/14 0903  10/21/14 0713 10/22/14 0617  WBC 2.7*  --  2.9* 2.9*  NEUTROABS 1.0*  --   --  0.9*  HGB 13.3  < > 13.1 12.6*  HCT 38.7*  < > 38.6* 37.1*  MCV 84.1  --  84.6 85.3  PLT 124*  --  126* 119*  < > = values in this interval not displayed.  Basic Metabolic Panel:    Recent Labs Lab 10/20/14 0903 10/20/14 0909 10/21/14 0713  NA 135 138 136  K 4.0 3.9 3.8  CL 106 103 105  CO2 21*  --  25  GLUCOSE 94 93 94  BUN CREATININE 1.33* 1.40* 1.24  CALCIUM 8.9  --  8.6*    Lipid Panel:     Component Value Date/Time   CHOL 159 10/21/2014 0713   TRIG 55 10/21/2014 0713   HDL 43 10/21/2014 0713   CHOLHDL 3.7 10/21/2014 0713   VLDL 11 10/21/2014 0713   LDLCALC 105* 10/21/2014 0713   HgbA1c: No results found for: HGBA1C Urine Drug Screen: No results found for: LABOPIA, COCAINSCRNUR, LABBENZ, AMPHETMU, THCU, LABBARB    IMAGING  Ct Angio Head and Neck W/cm &/or Wo Cm 10/20/2014    No CT or CTA evidence of acute intracranial infarction.  70% stenosis of the proximal right internal carotid artery.  80% stenosis of the proximal left internal carotid artery.  Atherosclerotic disease in the carotid siphon regions with maximal stenosis estimated at 30-40% on both sides.     Dg Chest 2 View 10/20/2014    1. Stable cardiomegaly.  No CHF.  Cardiac pacer stable position.  2. Low lung volumes  with mild bibasilar subsegmental atelectasis.      Ct Head Wo Contrast 10/20/2014    Moderate diffuse cortical atrophy. Minimal chronic ischemic white matter disease. No acute intracranial abnormality seen.   2D echo - - Left ventricle: Hypokinesis of the anterior wall and the anterior septum. The cavity size was normal. Wall thickness was increased in a pattern of mild LVH. The estimated ejection fraction was 40%. - Mitral valve: There was moderate regurgitation. - Left atrium: The atrium was severely dilated. - Right ventricle: The cavity size was mildly dilated. Systolic function was mildly reduced. - Right atrium: The atrium was mildly dilated. - Tricuspid valve: There was moderate regurgitation. - Pulmonary arteries: PA peak pressure: 35 mm Hg (S). - Impressions: No cardiac source of embolism was identified, but cannot be ruled out on the  basis of this examination. Impressions: - No cardiac source of embolism was identified, but cannot be ruled out on the basis of this examination.  EEG - Normal   PHYSICAL EXAM Temp:  [97 F (36.1 C)-98.1 F (36.7 C)] 97 F (36.1 C) (09/18 0956) Pulse Rate:  [60-118] 110 (09/18 0956) Resp:  [18] 18 (09/18 0956) BP: (131-150)/(61-74) 134/63 mmHg (09/18 0956) SpO2:  [100 %] 100 % (09/18 0956) FiO2 (%):  [21 %] 21 % (09/17 2322) Weight:  [85.333 kg (188 lb 2 oz)] 85.333 kg (188 lb 2 oz) (09/18 0725)  General - Well nourished, well developed, in no apparent distress, sitting in chair.  Ophthalmologic - Fundi not visualized due to eye movement.  Cardiovascular - irregularly heart rate and rhythm with   afib rhythm   Mental Status -  Level of arousal and orientation to time, place, and person were intact. Language including expression, naming, repetition, comprehension was assessed and found intact. Fund of Knowledge was assessed and was impaired.  Cranial Nerves II - XII - II - Visual field intact OU. III, IV, VI - Extraocular movements intact. V - Facial sensation intact bilaterally. VII - Facial movement intact bilaterally. VIII - Hearing & vestibular intact bilaterally. X - Palate elevates symmetrically. XI - Chin turning & shoulder shrug intact bilaterally. XII - Tongue protrusion intact.  Motor Strength - The patient's strength was normal in all extremities and pronator drift was absent.  Bulk was normal and fasciculations were absent.   Motor Tone - Muscle tone was assessed at the neck and appendages and was normal.  Reflexes - The patient's reflexes were 1+ in all extremities and he had no pathological reflexes.  Sensory - Light touch, temperature/pinprick were assessed and were symmetrical.    Coordination - The patient had normal movements in the hands and feet with no ataxia or dysmetria.  Tremor was absent.  Gait and Station - deferred to PT who was in  room.  ASSESSMENT/PLAN [name] is a 81 y.o. male with history of HTN, HLD, OSA on CPAP, Afib on ASA, heart block on pacemaker, partial seizure on keppra  presenting with left arm and leg weakness. He did not receive IV t-PA due to rapid improving. Symptoms not resolved.    TIA vs. Stroke - right brain symptoms, could be due to afib not on AC vs. Large vessel athero due to bilateral ICA high grade stenosis.  Resultant  Resolution of deficit  CT repeat on acute abnormalities  CTA head and neck - right ICA 70% and left ICA 80% stenosis. Calcification at b/l supraclinoid ICAs  2D Echo  EF 40%, severe dilated LA.  LDL 105  HgbA1c pending  lovenox for VTE prophylaxis Diet Heart Room service appropriate?: Yes; Fluid consistency:: Thin  aspirin 81 mg orally every day prior to admission, now on aspirin 325 mg orally every day. Discussed with pt and daughter, they are OK with eliquis  bid for stroke prevention due to afib. However, would like to consider after vascular surgery evaluation.  Patient counseled to be compliant with his antithrombotic medications  Ongoing aggressive stroke risk factor management  Therapy recommendations:  pending  Disposition:  Pending  Carotid artery high grade stenosis  Right ICA 70% and left ICA 80% stenosis    vascular surgery  plan right CEA 10/23/14  Hold of anticoagulation for now after VVS eval.  From stroke standpoint, pt is ok to have CEA within two weeks.  Afib not on AC  Hx of Afib on coumadin but stopped in 1991 due to brief GIB  Pt and family is OK with NOACs especially eliquis   Recommend eliquis  bid for stroke prevention due to afib. However, would like to consider after vascular surgery evaluation.         Condition Points   C CHF (or Left ventricular systolic dysfunction) 1   H HTN: BP consistently above 140/90 mmHg (or treated HTN on meds)  1   A2 Age ?75 years 2   D DM 0   S2 Prior Stroke or TIA or  thromboembolism 2   V Vascular disease (e.g. PAD, MI, aortic plaque) 0   A Age 90-74 years 0   [place] male sex  0           Annual Stroke Risk CHA2DS2-VASc Score Stroke Risk % 95% CI  0 0  1 1.3  2 2.2  3 3.2  4 4.0  5 6.7  6 9.8  7 9.6  8 12.5  9 15.2      Hypertension  Stable Permissive hypertension (OK if < 220/120) but gradually normalize in 5-7 days Avoid hypotension due to carotid stenosis   Hyperlipidemia  Home meds: none  LDL 105, goal < 70  Add pravastatin   Continue statin at discharge  Hx of seizure, partial  Follows up with neurology in [facility]  On keepra 250/500  Was recommended to add vimpat by Dr. Thad Ranger in 09/2014 but family does not add new AED due to concerns that pt would be very drowsy and not able to function by family members.  Ok to continue keppra 250/500 for now  Follow up with neurology in [facility]  EEG normal, last EEG in 06/2014 in [facility] was normal  Other Stroke Risk Factors  Advanced age  Obstructive sleep apnea, on CPAP at home  Other Active Problems  CHB on pacemaker  Hospital day # 1 I have personally examined this patient, reviewed notes, independently viewed imaging studies, participated in medical decision making and plan of care. I have made any additions or clarifications directly to the above note. Agree with note above. The patient has divergent per day and is at risk for stroke from atrial fibrillation and bilateral carotid stenosis. Patient is also at risk for neurological worsening and needs ongoing stroke evaluation and treatment. Agree with plan for elective right carotid endarterectomy tomorrow by Dr. Arbie Cookey and followed by long-term anticoagulation with eliquis. Patient is also likely going to need elective endarterectomy on the left side as well. I had a long discussion with the patient and family at the bedside and answered questions.  Delia Heady, MD  Medical Director [facility] Pager:  [phone] 10/22/2014 2:36 PM      To contact Stroke Continuity Shepherd [facility]an, please refer to [email or web address]. After hours, contact General Neurology

## 2014-10-22 NOTE — Consult Note (Signed)
Consult Note  Patient name: Oscar Santiago MRN: [id number] DOB: [birth year] Sex: male  Consulting Physician:  Hospitalist service  Reason for Consult:  Chief Complaint  Patient presents with  . Code Stroke    HISTORY OF PRESENT ILLNESS: This is a very pleasant 81 year old gentleman who presented to the hospital on 10/20/2014 with left arm weakness.  He stated that he went to button his shirt and could not.  He then fell to the floor with his left leg giving out.  Upon arrival to the hospital he was found to have a facial droop and dysarthria.  His symptoms rapidly improved and therefore he did not receive TPA.  His initial CT scan showed no acute intracranial abnormality.  He is not a candidate for MRI because of his pacemaker.  CT angiogram of the neck revealed 70% stenosis of the proximal right internal carotid artery and 80% stenosis of the left internal carotid artery.  He is now back to his baseline.  The patient has a history of a seizure disorder he has not had a seizure for approximately 3 weeks.  He has a history of atrial fibrillation but has declined taking anticoagulation.  He is medically managed for hypertension.  He is on a statin for hypercholesterolemia.  He is a nonsmoker.  He has a history of complete heart block and has a pacemaker in place.  He has stable stage III kidney disease  Past Medical History  Diagnosis Date  . Hypertension   . Seizure disorder   . Glaucoma   . Hyperlipidemia   . Osteopenia   . Compression fracture of spine     T 10 and T11  . Gout   . OSA (obstructive sleep apnea)     on CPAP therapy  . Hypothyroid   . Inguinal hernia   . Hemorrhoid   . Chronic systolic dysfunction of left ventricle     EF 30-35% by echo 10/10/2009  . Major depression   . History of GI bleed     from coumadin  . MR (mitral regurgitation)   . Hearing loss   . Dizziness     intermittent  . Kidney stone   . CKD (chronic kidney disease), stage III   .  Permanent atrial fibrillation     refuses coumadin due to history of GI bleed  . Decreased appetite 03/02/12  . Weight loss 03/02/12  . Complete heart block   . CHF (congestive heart failure)     Past Surgical History  Procedure Laterality Date  . Transurethral resection of prostate    . Cardiac catheterization  2002  . Pacemaker insertion  07/28/06    VVI pacemaker for complete heart block by Dr Amil Amen  . Inguinal hernia repair    . Hemorrhoid surgery    . Colonoscopy with propofol N/A 05/01/2014    Procedure: COLONOSCOPY WITH PROPOFOL;  Surgeon: [name], MD;  Location: [facility];  Service: Endoscopy;  Laterality: N/A;    Social History   Social History  . Marital Status: Widowed    Spouse Name: N/A  . Number of Children: 6  . Years of Education: N/A   Occupational History  . Retired     Education officer, environmental   Social History Main Topics  . Smoking status: Never Smoker   . Smokeless tobacco: Never Used  . Alcohol Use: No  . Drug Use: No  . Sexual Activity: Not on file   Other  Topics Concern  . 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 Katrinka Blazing - Cardiology   Dr. Leone Payor - Gastroenterology   Dr. Brunilda Payor - Urology   [facility]    Family History  Problem Relation Age of Onset  . Heart failure Mother   . CVA Sister   . Aneurysm Brother     brain  . Colon cancer Neg Hx   . Colon polyps Neg Hx     Allergies as of 10/20/2014 - Review Complete 10/20/2014  Allergen Reaction Noted  . Coumadin [warfarin sodium] Other (See Comments) 12/24/2012  . Lisinopril Cough 12/24/2012    No current facility-administered medications on file prior to encounter.   Current Outpatient Prescriptions on File Prior to Encounter  Medication Sig Dispense Refill  . alendronate (FOSAMAX) 70 MG tablet Take 70 mg by mouth every Monday.     Marland Kitchen  amLODipine (NORVASC) 10 MG tablet Take 5 mg by mouth daily as needed (diastolic blood pressure over 90).     . cetirizine (ZYRTEC) 10 MG tablet Take 10 mg by mouth daily.    . colchicine 0.6 MG tablet TAKE 1 TABLET TWICE DAILY (Patient taking differently: TAKE 1 TABLET TWICE DAILY AS NEEDED FOR GOUT) 30 tablet 4  . Dextromethorphan-Guaifenesin (CORICIDIN HBP CONGESTION/COUGH) 10-200 MG CAPS Take 1 capsule by mouth daily as needed (for cold).     . dorzolamide-timolol (COSOPT) 22.3-6.8 MG/ML ophthalmic solution Place 1 drop into both eyes 2 (two) times daily.  6  . famotidine (PEPCID AC) 10 MG chewable tablet Chew 10 mg by mouth daily as needed for heartburn.     . fluticasone (FLONASE) 50 MCG/ACT nasal spray Place 2 sprays into both nostrils daily as needed for allergies or rhinitis. \    . furosemide (LASIX) 20 MG tablet Take 1 tablet (20 mg total) by mouth daily. 30 tablet 11  . latanoprost (XALATAN) 0.005 % ophthalmic solution Place 1 drop into both eyes at bedtime.     Marland Kitchen levothyroxine (SYNTHROID, LEVOTHROID) 25 MCG tablet TAKE 1 TABLET (25 MCG TOTAL) BY MOUTH DAILY BEFORE BREAKFAST. 90 tablet 1  . valsartan (DIOVAN) 320 MG tablet Take 1 tablet (320 mg total) by mouth daily with lunch. 30 tablet 11  . zolpidem (AMBIEN) 5 MG tablet TAKE 1 TABLET AT BEDTIME (Patient taking differently: TAKE 1/2 TABLET BY MOUTH AT BEDTIME) 30 tablet 2  . lacosamide (VIMPAT) 50 MG TABS tablet Take 1 tablet (50 mg total) by mouth 2 (two) times daily. (Patient not taking: Reported on 10/20/2014) 60 tablet 0     REVIEW OF SYSTEMS: In addition to the HPI above,  No Fever-chills, myalgias or other constitutional symptoms No Headache, changes with Vision or hearing No problems swallowing food or Liquids, indigestion/reflux No Chest pain, Cough or Shortness of Breath, palpitations, orthopnea or DOE No Abdominal pain, N/V; no melena or hematochezia, no dark tarry stools, Bowel movements are regular, No dysuria,  hematuria or flank pain No new skin rashes, lesions, masses or bruises, No new joints pains-aches No recent weight gain or loss No polyuria, polydypsia or polyphagia,  PHYSICAL EXAMINATION: General: The patient appears their stated age.  Vital signs are BP 134/63 mmHg  Pulse 110  Temp(Src) 97 F (36.1 C) (Oral)  Resp 18  Wt 188  lb 2 oz (85.333 kg)  SpO2 100% Pulmonary: Respirations are non-labored HEENT:  No gross abnormalities Abdomen: Soft and non-tender  Musculoskeletal: There are no major deformities.   Neurologic: No focal weakness or paresthesias are detected, Skin: There are no ulcer or rashes noted. Psychiatric: The patient has normal affect. Cardiovascular: There is a regular rate and rhythm without significant murmur appreciated.  Diagnostic Studies: I have reviewed his CT scan which shows the following findings: No CT or CTA evidence of acute intracranial infarction.  70% stenosis of the proximal right internal carotid artery.  80% stenosis of the proximal left internal carotid artery.  Atherosclerotic disease in the carotid siphon regions with maximal stenosis estimated at 30-40% on both sides  Assessment:  Right brain TIA versus stroke Plan: It is not 100% clear the true etiology of his neurologic episode.  This could be related to his atrial fibrillation, for which he is not anticoagulated, or it could be secondary to his right carotid stenosis.  Given that the true etiology cannot be determined is reasonable to proceed with right carotid endarterectomy in order to decrease his risk for stroke.  I discussed with the patient and his family including his power of attorney, the risks and benefits of carotid endarterectomy which include but are not limited to the risk of bleeding, stroke, and nerve injury.  He has received cardiology evaluation who feels that he is at moderate risk.  Therefore we will proceed with carotid endarterectomy on the right tomorrow by Dr.  early.  All questions of the family and patient were answered in detail.  Plan to start anti-coagulation after his surgery     V. Charlena Cross, M.D. [facility] Office: [phone] Pager:  [phone]

## 2014-10-22 NOTE — Progress Notes (Signed)
Patient placed himself on his home CPAP unit. No O2 bleed in needed. RT will continue to monitor as needed.

## 2014-10-22 NOTE — Progress Notes (Signed)
[facility] Progress Note   Oscar Santiago  AVW:[id number]  DOB: February 05, 1925  DOA: 10/20/2014 PCP: Kristian Covey, MD  Brief narrative: Oscar Santiago is a 81 y.o. male with hypertension, seizure disorder, dyslipidemia, sleep apnea, gout, hypothyroidism, chronic kidney disease stage III, complete heart block status post pacemaker, A. fib not on anticoagulation. The patient presents with a complaint of left-sided weakness. He specifically states that his left arm was weak she noted while buttoning his shirt and he subsequently slid off the bed and fell to the ground-left arm weakness improved slowly to a point where he was able to make a phone call. EMS noted left-sided did weakness, dysarthria and facial droop. In the ER he was found to have left arm weakness which according to the patient improved by the time he was moved out of the ER.  Subjective: No complaints of focal weakness, numbness, dizziness, shortness of breath.   Assessment/Plan: Principal Problem:   TIA (transient ischemic attack) -Unable to obtain MRI due to his pacemaker but CT of the head does not show an acute infarct - found to have significant carotid artery stenosis on CTA-neurology recommending vascular surgery consult - neurology also is suspicious that he may have had a seizure and recommending to continue Keppra  -Neurology recommending to start eliquis 5 mg twice a day for A. fib for secondary stroke prevention--this can be done after his surgery tomorrow - of note no cardiac thrombus noted on echo - LDL 105--continue statin  Active Problems:  Bilateral carotid artery disease -Have consulted vascular surgery - -Dr. York Pellant plans to take him to the OR tomorrow for carotid endarterectomy - he has requested a cardiology consult for cardiac clearance which has been completed -He is a moderate risk for surgery from cardiac standpoint    Chronic atrial fibrillation (CHADVASc = 5) -Apparently had some GI  bleeding back in the 1990s -he states he has not had any bleeding since-no history of PUD either  - I have discussed starting anticoagulation for stroke prevention and he is in agreement with this -Start anticoagulation after carotid endarterectomy   Chronic systolic heart failure -EF 40%-continue low-dose Lasix and watch I and O -On Diovan at home - cont Metoprolol-started by cardiology 9/17 -Also has Moderate mitral regurgitation and tricuspid regurgitation    Hypertension -Continue current medications  Seizure disorder Continue Keppra per neurology    Hyperlipidemia -Continue pravastatin    OSA (obstructive sleep apnea) -Will order C Pap    Cardiac pacemaker in situ    CKD (chronic kidney disease), stage III - stable    Hypothyroidism --Continue Synthroid  Mild thrombocytopenia and leukopenia -When looking back at his blood work, it appears that his thrombocytopenia is acute since August  -Leukopenia dates back to January of this year  -Hemoglobin stable  -Continue to follow-may need hematology workup  -Will order a manual differential   Code Status:     Code Status Orders        Start     Ordered   10/20/14 1156  Full code   Continuous     10/20/14 1155     Family Communication: Granddaughter at bedside DVT prophylaxis: Lovenox Consultants: Neurology, vascular surgery, cardiology  Antibiotics: Anti-infectives    None      Objective: Filed Weights   10/22/14 0725  Weight: 85.333 kg (188 lb 2 oz)    Intake/Output Summary (Last 24 hours) at 10/22/14 1503 Last data filed at 10/21/14 1800  Gross per 24 hour  Intake    240 ml  Output     50 ml  Net    190 ml     Vitals Filed Vitals:   10/22/14 0617 10/22/14 0725 10/22/14 0956 10/22/14 1457  BP: 150/61  134/63 132/74  Pulse: 60  110 71  Temp: 98.1 F (36.7 C)  97 F (36.1 C) 97.9 F (36.6 C)  TempSrc: Oral  Oral Oral  Resp: Weight:  85.333 kg (188 lb 2 oz)    SpO2: 100%   100% 100%    Exam:  General:  Pt is alert, not in acute distress  HEENT: No icterus, No thrush, oral mucosa moist  Cardiovascular:IIRR, S1/S2 No murmur  Respiratory: clear to auscultation bilaterally   Abdomen: Soft, +Bowel sounds, non tender, non distended, no guarding  MSK: No LE edema, cyanosis or clubbing  Data Reviewed: Basic Metabolic Panel:  Recent Labs Lab 10/20/14 0903 10/20/14 0909 10/21/14 0713  NA 135 138 136  K 4.0 3.9 3.8  CL 106 103 105  CO2 21*  --  25  GLUCOSE 94 93 94  BUN CREATININE 1.33* 1.40* 1.24  CALCIUM 8.9  --  8.6*   Liver Function Tests:  Recent Labs Lab 10/20/14 0903  AST 37  ALT 21  ALKPHOS 56  BILITOT 0.8  PROT 6.8  ALBUMIN 3.7   No results for input(s): LIPASE, AMYLASE in the last 168 hours. No results for input(s): AMMONIA in the last 168 hours. CBC:  Recent Labs Lab 10/20/14 0903 10/20/14 0909 10/21/14 0713 10/22/14 0617  WBC 2.7*  --  2.9* 2.9*  NEUTROABS 1.0*  --   --  0.9*  HGB 13.3 15.0 13.1 12.6*  HCT 38.7* 44.0 38.6* 37.1*  MCV 84.1  --  84.6 85.3  PLT 124*  --  126* 119*   Cardiac Enzymes: No results for input(s): CKTOTAL, CKMB, CKMBINDEX, TROPONINI in the last 168 hours. BNP (last 3 results) No results for input(s): BNP in the last 8760 hours.  ProBNP (last 3 results) No results for input(s): PROBNP in the last 8760 hours.  CBG:  Recent Labs Lab 10/20/14 0922  GLUCAP 91    No results found for this or any previous visit (from the past 240 hour(s)).   Studies: Ct Angio Head W/cm &/or Wo Cm  10/20/2014   CLINICAL DATA:  Gait disturbance. Slurred speech. Seizures. Atrial fibrillation. Symptoms began today.  EXAM: CT ANGIOGRAPHY HEAD AND NECK  TECHNIQUE: Multidetector CT imaging of the head and neck was performed using the standard protocol during bolus administration of intravenous contrast. Multiplanar CT image reconstructions and MIPs were obtained to evaluate the vascular anatomy.  Carotid stenosis measurements (when applicable) are obtained utilizing NASCET criteria, using the distal internal carotid diameter as the denominator.  CONTRAST:  50mL OMNIPAQUE IOHEXOL 350 MG/ML SOLN  COMPARISON:  CT earlier same day.  FINDINGS: CT HEAD  Brain shows generalized atrophy with mild chronic small-vessel changes of the white matter. No sign of acute infarction, mass lesion, hemorrhage, hydrocephalus or extra-axial collection.  CTA NECK  Aortic arch: Mild atherosclerosis of the arch without aneurysm or dissection. Branching pattern of the brachiocephalic vessels from the arch is normal without origin stenosis.  Right carotid system: Common carotid artery widely patent to the bifurcation. There is advanced atherosclerotic disease affecting the proximal internal carotid artery. Minimal diameter in the bulb is 1.5 mm. Compared to a more distal cervical ICA diameter of 5 mm,  this indicates 70% stenosis.  Left carotid system: Common carotid artery widely patent to the bifurcation region. Complex atherosclerotic disease of the ICA bulb with minimal diameter of 1 mm. Compared to a more distal cervical ICA diameter of 5 mm, this indicates an 80% stenosis.  Vertebral arteries:The left vertebral artery is dominant. There is 20% stenosis at the origin. The vessel is widely patent beyond that. The nondominant right vertebral artery is widely patent at its origin and through the cervical region.  Skeleton: Ordinary spondylosis  Other neck: No significant soft tissue lesion no upper chest lesion.  CTA HEAD  Anterior circulation: Internal carotid arteries are widely patent through the skullbase. There is atherosclerotic calcification in the siphon regions but no stenosis greater than 30-40%. Supra clinoid internal carotid arteries are widely patent. The anterior and middle cerebral vessels are patent without proximal stenosis, aneurysm or vascular malformation. No missing branches are appreciated.  Posterior  circulation: Both vertebral arteries are widely patent through the foramen magnum to the basilar. No basilar stenosis. Posterior circulation branch vessels are patent.  Venous sinuses: Patent and normal  Anatomic variants: None significant  IMPRESSION: No CT or CTA evidence of acute intracranial infarction.  70% stenosis of the proximal right internal carotid artery.  80% stenosis of the proximal left internal carotid artery.  Atherosclerotic disease in the carotid siphon regions with maximal stenosis estimated at 30-40% on both sides.   Electronically Signed   By: Paulina Fusi M.D.   On: 10/20/2014 20:32   Ct Angio Neck W/cm &/or Wo/cm  10/20/2014   CLINICAL DATA:  Gait disturbance. Slurred speech. Seizures. Atrial fibrillation. Symptoms began today.  EXAM: CT ANGIOGRAPHY HEAD AND NECK  TECHNIQUE: Multidetector CT imaging of the head and neck was performed using the standard protocol during bolus administration of intravenous contrast. Multiplanar CT image reconstructions and MIPs were obtained to evaluate the vascular anatomy. Carotid stenosis measurements (when applicable) are obtained utilizing NASCET criteria, using the distal internal carotid diameter as the denominator.  CONTRAST:  50mL OMNIPAQUE IOHEXOL 350 MG/ML SOLN  COMPARISON:  CT earlier same day.  FINDINGS: CT HEAD  Brain shows generalized atrophy with mild chronic small-vessel changes of the white matter. No sign of acute infarction, mass lesion, hemorrhage, hydrocephalus or extra-axial collection.  CTA NECK  Aortic arch: Mild atherosclerosis of the arch without aneurysm or dissection. Branching pattern of the brachiocephalic vessels from the arch is normal without origin stenosis.  Right carotid system: Common carotid artery widely patent to the bifurcation. There is advanced atherosclerotic disease affecting the proximal internal carotid artery. Minimal diameter in the bulb is 1.5 mm. Compared to a more distal cervical ICA diameter of 5 mm, this  indicates 70% stenosis.  Left carotid system: Common carotid artery widely patent to the bifurcation region. Complex atherosclerotic disease of the ICA bulb with minimal diameter of 1 mm. Compared to a more distal cervical ICA diameter of 5 mm, this indicates an 80% stenosis.  Vertebral arteries:The left vertebral artery is dominant. There is 20% stenosis at the origin. The vessel is widely patent beyond that. The nondominant right vertebral artery is widely patent at its origin and through the cervical region.  Skeleton: Ordinary spondylosis  Other neck: No significant soft tissue lesion no upper chest lesion.  CTA HEAD  Anterior circulation: Internal carotid arteries are widely patent through the skullbase. There is atherosclerotic calcification in the siphon regions but no stenosis greater than 30-40%. Supra clinoid internal carotid arteries are widely patent. The  anterior and middle cerebral vessels are patent without proximal stenosis, aneurysm or vascular malformation. No missing branches are appreciated.  Posterior circulation: Both vertebral arteries are widely patent through the foramen magnum to the basilar. No basilar stenosis. Posterior circulation branch vessels are patent.  Venous sinuses: Patent and normal  Anatomic variants: None significant  IMPRESSION: No CT or CTA evidence of acute intracranial infarction.  70% stenosis of the proximal right internal carotid artery.  80% stenosis of the proximal left internal carotid artery.  Atherosclerotic disease in the carotid siphon regions with maximal stenosis estimated at 30-40% on both sides.   Electronically Signed   By: Paulina Fusi M.D.   On: 10/20/2014 20:32    Scheduled Meds:  Scheduled Meds: . aspirin  300 mg Rectal Daily   Or  . aspirin  325 mg Oral Daily  . dorzolamide-timolol  1 drop Both Eyes BID  . enoxaparin (LOVENOX) injection  40 mg Subcutaneous Daily  . furosemide  20 mg Oral Daily  . irbesartan  37.5 mg Oral Daily  .  latanoprost  1 drop Both Eyes QHS  . levETIRAcetam  250 mg Oral q morning - 10a  . levETIRAcetam  500 mg Oral QHS  . levothyroxine  25 mcg Oral QAC breakfast  . loratadine  10 mg Oral Daily  . metoprolol succinate  25 mg Oral Daily  . multivitamin with minerals  1 tablet Oral Daily  . pravastatin  20 mg Oral q1800  . zolpidem  2.5 mg Oral QHS   Continuous Infusions:   Time spent on care of this patient: 35 min   Cale Bethard, MD 10/22/2014, 3:03 PM    [facility] Office  [phone] Pager - Text Page per [email or web address] If 7PM-7AM, please contact night-coverage [email or web address]

## 2014-10-22 NOTE — Progress Notes (Signed)
Occupational Therapy Evaluation Patient Details Name: Oscar Santiago MRN: [id number] DOB: [birth year] Today's Date: 10/22/2014    History of Present Illness Oscar Santiago is an 81 y.o. male presenting to the hospital. Patient awoke at 0700 hours and felt normal. He went to put on his shirt and noted his left arm was week and then fell due to his left leg giving out. EMS was called to [facility] for a fall but on arrival noted he had left sided plegia and facial droop. He was also noted to have dysarthria. On arrival to ED his dysarthria resolved but continued to have left arm weakness but NOT plegia. He only had a left arm drift.    Clinical Impression   Pt admitted with the above diagnoses and presents with below problem list. Pt will benefit from continued acute OT to address the below listed deficits and maximize independence with BADLs. PTA pt was independent with ADLs. Pt is currently supervision level for LB ADLs and toilet/shower transfers. Session details below. OT to continue to follow acutely.     Follow Up Recommendations  Supervision/Assistance - 24 hour;Home health OT    Equipment Recommendations  None recommended by OT    Recommendations for Other Services       Precautions / Restrictions Precautions Precautions: Fall Restrictions Weight Bearing Restrictions: No      Mobility Bed Mobility               General bed mobility comments: in recliner  Transfers Overall transfer level: Needs assistance Equipment used: Rolling walker (2 wheeled) Transfers: Sit to/from Stand Sit to Stand: Modified independent (Device/Increase time);Supervision         General transfer comment: from recliner, supervision for safety, used rw    Balance Overall balance assessment: Needs assistance Sitting-balance support: Feet supported Sitting balance-Leahy Scale: Good     Standing balance support: During functional activity;No upper extremity supported;Bilateral upper  extremity supported Standing balance-Leahy Scale: Fair Standing balance comment: stood to complete oral care with intermittent external support, BLE weakness noted but no LOB                            ADL Overall ADL's : Needs assistance/impaired Eating/Feeding: Set up;Sitting   Grooming: Oral care;Supervision/safety;Standing Grooming Details (indicate cue type and reason): able to manipulate containers with LUE with no difficutly Upper Body Bathing: Supervision/ safety;Sitting   Lower Body Bathing: Supervison/ safety;Sit to/from stand   Upper Body Dressing : Supervision/safety;Sitting   Lower Body Dressing: Supervision/safety;Sit to/from stand   Toilet Transfer: Supervision/safety;Ambulation   Toileting- Clothing Manipulation and Hygiene: Supervision/safety   Tub/ Shower Transfer: Supervision/safety;Tub transfer;Ambulation;3 in 1;Rolling walker Tub/Shower Transfer Details (indicate cue type and reason): Educated on tub transfer using 3n1 as shower seat. Discussed having someone with him when he is getting in/out of shower. Pt reports he mostly sponge bathes but does still occasionally complete tub transfers.  Functional mobility during ADLs: Supervision/safety;Rolling walker General ADL Comments: Worked with pt on technique for rw/cane during ADLs. Discussed fall prevention tips for home safety.      Vision     Perception     Praxis      Pertinent Vitals/Pain Pain Assessment: No/denies pain     Hand Dominance Right   Extremity/Trunk Assessment Upper Extremity Assessment Upper Extremity Assessment: Overall WFL for tasks assessed   Lower Extremity Assessment Lower Extremity Assessment: Defer to PT evaluation       Communication  Communication Communication: HOH   Cognition Arousal/Alertness: Awake/alert Behavior During Therapy: WFL for tasks assessed/performed Overall Cognitive Status: Within Functional Limits for tasks assessed                      General Comments       Exercises       Shoulder Instructions      Home Living Family/patient expects to be discharged to:: Private residence Living Arrangements: Alone Available Help at Discharge: Family;Available PRN/intermittently Type of Home: House Home Access: Ramped entrance     Home Layout: Two level;Laundry or work area in basement;Able to live on main level with bedroom/bathroom Alternate Teacher, music of Steps: 13 Alternate Level Stairs-Rails: Right;Left;Can reach both Bathroom Shower/Tub: Tub/shower unit;Curtain   Firefighter: Standard Bathroom Accessibility: Yes   Home Equipment: Cane - single point;Bedside commode          Prior Functioning/Environment Level of Independence: Independent with assistive device(s)        Comments: used cane outdoors    OT Diagnosis: Other (comment) (decreased balance)   OT Problem List: Impaired balance (sitting and/or standing);Decreased activity tolerance;Decreased knowledge of use of DME or AE;Decreased knowledge of precautions   OT Treatment/Interventions: Self-care/ADL training;DME and/or AE instruction;Therapeutic activities;Patient/family education;Balance training    OT Goals(Current goals can be found in the care plan section) Acute Rehab OT Goals Patient Stated Goal: to gohome OT Goal Formulation: With patient Time For Goal Achievement: 10/29/14 Potential to Achieve Goals: Good ADL Goals Pt Will Perform Grooming: with modified independence;standing Pt Will Perform Tub/Shower Transfer: Tub transfer;with modified independence;rolling walker;3 in 1;ambulating  OT Frequency: Min 2X/week   Barriers to D/C:            Co-evaluation              End of Session Equipment Utilized During Treatment: Rolling walker  Activity Tolerance: Patient tolerated treatment well Patient left: in chair;with call bell/phone within reach;with chair alarm set   Time: 1610-9604 OT Time Calculation  (min): 25 min Charges:  OT General Charges $OT Visit: 1 Procedure OT Evaluation $Initial OT Evaluation Tier I: 1 Procedure OT Treatments $Self Care/Home Management : 8-22 mins G-Codes:    Pilar Grammes 2014-11-06, 3:29 PM

## 2014-10-22 NOTE — Consult Note (Signed)
Full note to follow Plan to speak with POA at 1 today Possible surgery tomorrow   Durene Cal

## 2014-10-22 NOTE — Progress Notes (Signed)
   No change in clinical status.  Await final recommendations from Dr. Myra Gianotti.  Cleared from cardiac standpoint, and considered to be moderate to high risk considering age and other comorbidities.

## 2014-10-23 ENCOUNTER — Inpatient Hospital Stay (HOSPITAL_COMMUNITY): Payer: Medicare Other | Admitting: Anesthesiology

## 2014-10-23 ENCOUNTER — Encounter (HOSPITAL_COMMUNITY): Payer: Self-pay | Admitting: Anesthesiology

## 2014-10-23 ENCOUNTER — Encounter (HOSPITAL_COMMUNITY)
Admission: EM | Disposition: A | Discharge status: Home / Self Care | Payer: Self-pay | Source: Home / Self Care | Attending: Internal Medicine

## 2014-10-23 HISTORY — PX: ENDARTERECTOMY: SHX5162

## 2014-10-23 LAB — CBC
HCT: 40.4 % (ref 39.0–52.0)
Hemoglobin: 13.7 g/dL (ref 13.0–17.0)
MCH: 28.7 pg (ref 26.0–34.0)
MCHC: 33.9 g/dL (ref 30.0–36.0)
MCV: 84.7 fL (ref 78.0–100.0)
PLATELETS: 121 10*3/uL — AB (ref 150–400)
RBC: 4.77 MIL/uL (ref 4.22–5.81)
RDW: 15.9 % — AB (ref 11.5–15.5)
WBC: 3.5 10*3/uL — AB (ref 4.0–10.5)

## 2014-10-23 LAB — BASIC METABOLIC PANEL
Anion gap: 8 (ref 5–15)
BUN: 17 mg/dL (ref 6–20)
CO2: 23 mmol/L (ref 22–32)
CREATININE: 1.24 mg/dL (ref 0.61–1.24)
Calcium: 8.8 mg/dL — ABNORMAL LOW (ref 8.9–10.3)
Chloride: 102 mmol/L (ref 101–111)
GFR calc Af Amer: 58 mL/min — ABNORMAL LOW (ref >60)
GFR, EST NON AFRICAN AMERICAN: 50 mL/min — AB (ref >60)
Glucose, Bld: 95 mg/dL (ref 65–99)
Potassium: 3.6 mmol/L (ref 3.5–5.1)
SODIUM: 133 mmol/L — AB (ref 135–145)

## 2014-10-23 LAB — HEMOGLOBIN A1C
Hgb A1c MFr Bld: 5.9 % — ABNORMAL HIGH (ref 4.8–5.6)
MEAN PLASMA GLUCOSE: 123 mg/dL

## 2014-10-23 LAB — GLUCOSE, CAPILLARY: Glucose-Capillary: 79 mg/dL (ref 65–99)

## 2014-10-23 LAB — ABO/RH: ABO/RH(D): A POS

## 2014-10-23 LAB — TYPE AND SCREEN
ABO/RH(D): A POS
ANTIBODY SCREEN: NEGATIVE

## 2014-10-23 SURGERY — ENDARTERECTOMY, CAROTID
Anesthesia: General | Laterality: Right

## 2014-10-23 MED ORDER — NEOSTIGMINE METHYLSULFATE 10 MG/10ML IV SOLN
INTRAVENOUS | Status: DC | PRN
  Administered 2014-10-23: 4 mg via INTRAVENOUS

## 2014-10-23 MED ORDER — OXYCODONE-ACETAMINOPHEN 5-325 MG PO TABS
1.0000 | ORAL_TABLET | ORAL | Status: DC | PRN
  Administered 2014-10-24: 1 via ORAL
  Filled 2014-10-23: qty 1

## 2014-10-23 MED ORDER — OXYCODONE HCL 5 MG PO TABS: 5.0000 mg | ORAL_TABLET | Freq: Once | ORAL | Status: DC | PRN

## 2014-10-23 MED ORDER — SODIUM CHLORIDE 0.9 % IV SOLN: 500.0000 mL | Freq: Once | INTRAVENOUS | Status: DC | PRN

## 2014-10-23 MED ORDER — ACETAMINOPHEN 325 MG PO TABS: 325.0000 mg | ORAL_TABLET | ORAL | Status: DC | PRN

## 2014-10-23 MED ORDER — CEFAZOLIN SODIUM-DEXTROSE 2-3 GM-% IV SOLR
INTRAVENOUS | Status: AC
  Filled 2014-10-23: qty 50

## 2014-10-23 MED ORDER — SODIUM CHLORIDE 0.9 % IV SOLN
INTRAVENOUS | Status: DC
  Administered 2014-10-23 – 2014-10-24 (×2): via INTRAVENOUS

## 2014-10-23 MED ORDER — PROPOFOL 10 MG/ML IV BOLUS
INTRAVENOUS | Status: AC
  Filled 2014-10-23: qty 20

## 2014-10-23 MED ORDER — SODIUM CHLORIDE 0.9 % IV SOLN
0.0125 ug/kg/min | INTRAVENOUS | Status: DC
  Filled 2014-10-23: qty 1000

## 2014-10-23 MED ORDER — ROCURONIUM BROMIDE 100 MG/10ML IV SOLN
INTRAVENOUS | Status: DC | PRN
  Administered 2014-10-23: 50 mg via INTRAVENOUS

## 2014-10-23 MED ORDER — PROTAMINE SULFATE 10 MG/ML IV SOLN
INTRAVENOUS | Status: DC | PRN
  Administered 2014-10-23 (×5): 10 mg via INTRAVENOUS

## 2014-10-23 MED ORDER — ROCURONIUM BROMIDE 50 MG/5ML IV SOLN
INTRAVENOUS | Status: AC
  Filled 2014-10-23: qty 1

## 2014-10-23 MED ORDER — DOCUSATE SODIUM 100 MG PO CAPS
100.0000 mg | ORAL_CAPSULE | Freq: Every day | ORAL | Status: DC
  Administered 2014-10-24 – 2014-10-28 (×3): 100 mg via ORAL
  Filled 2014-10-23 (×5): qty 1

## 2014-10-23 MED ORDER — FENTANYL CITRATE (PF) 100 MCG/2ML IJ SOLN
25.0000 ug | INTRAMUSCULAR | Status: DC | PRN
  Administered 2014-10-23 (×5): 25 ug via INTRAVENOUS

## 2014-10-23 MED ORDER — ACETAMINOPHEN 160 MG/5ML PO SOLN
325.0000 mg | ORAL | Status: DC | PRN
Start: 2014-10-23 — End: 2014-10-23
  Filled 2014-10-23: qty 20.3

## 2014-10-23 MED ORDER — ACETAMINOPHEN 325 MG RE SUPP
325.0000 mg | RECTAL | Status: DC | PRN
  Filled 2014-10-23: qty 2

## 2014-10-23 MED ORDER — ENOXAPARIN SODIUM 40 MG/0.4ML ~~LOC~~ SOLN
40.0000 mg | Freq: Every day | SUBCUTANEOUS | Status: DC
  Administered 2014-10-24 – 2014-10-26 (×3): 40 mg via SUBCUTANEOUS
  Filled 2014-10-23 (×3): qty 0.4

## 2014-10-23 MED ORDER — PHENOL 1.4 % MT LIQD: 1.0000 | OROMUCOSAL | Status: DC | PRN

## 2014-10-23 MED ORDER — SENNOSIDES-DOCUSATE SODIUM 8.6-50 MG PO TABS
1.0000 | ORAL_TABLET | Freq: Every evening | ORAL | Status: DC | PRN
  Filled 2014-10-23: qty 1

## 2014-10-23 MED ORDER — LIDOCAINE HCL (CARDIAC) 20 MG/ML IV SOLN
INTRAVENOUS | Status: DC | PRN
  Administered 2014-10-23: 70 mg via INTRAVENOUS

## 2014-10-23 MED ORDER — LACTATED RINGERS IV SOLN
INTRAVENOUS | Status: DC
  Administered 2014-10-23 – 2014-10-24 (×2): via INTRAVENOUS

## 2014-10-23 MED ORDER — METOPROLOL TARTRATE 12.5 MG HALF TABLET
ORAL_TABLET | ORAL | Status: AC
  Filled 2014-10-23: qty 2

## 2014-10-23 MED ORDER — ALUM & MAG HYDROXIDE-SIMETH 200-200-20 MG/5ML PO SUSP: 15.0000 mL | ORAL | Status: DC | PRN

## 2014-10-23 MED ORDER — ACETAMINOPHEN 325 MG PO TABS
325.0000 mg | ORAL_TABLET | ORAL | Status: DC | PRN
Start: 2014-10-23 — End: 2014-10-23

## 2014-10-23 MED ORDER — METOPROLOL TARTRATE 1 MG/ML IV SOLN: 2.0000 mg | INTRAVENOUS | Status: DC | PRN

## 2014-10-23 MED ORDER — FENTANYL CITRATE (PF) 100 MCG/2ML IJ SOLN
INTRAMUSCULAR | Status: DC | PRN
  Administered 2014-10-23: 100 ug via INTRAVENOUS

## 2014-10-23 MED ORDER — HEPARIN SODIUM (PORCINE) 1000 UNIT/ML IJ SOLN
INTRAMUSCULAR | Status: DC | PRN
  Administered 2014-10-23: 8000 [IU] via INTRAVENOUS

## 2014-10-23 MED ORDER — HEPARIN SODIUM (PORCINE) 5000 UNIT/ML IJ SOLN
INTRAMUSCULAR | Status: DC | PRN
  Administered 2014-10-23: 500 mL

## 2014-10-23 MED ORDER — GLYCOPYRROLATE 0.2 MG/ML IJ SOLN
INTRAMUSCULAR | Status: DC | PRN
  Administered 2014-10-23: 0.6 mg via INTRAVENOUS

## 2014-10-23 MED ORDER — LIDOCAINE HCL (CARDIAC) 20 MG/ML IV SOLN
INTRAVENOUS | Status: AC
  Filled 2014-10-23: qty 5

## 2014-10-23 MED ORDER — LACTATED RINGERS IV SOLN
INTRAVENOUS | Status: DC | PRN
  Administered 2014-10-23: 11:00:00 via INTRAVENOUS

## 2014-10-23 MED ORDER — MORPHINE SULFATE (PF) 2 MG/ML IV SOLN
2.0000 mg | INTRAVENOUS | Status: DC | PRN
  Administered 2014-10-23 – 2014-10-24 (×3): 2 mg via INTRAVENOUS
  Filled 2014-10-23 (×4): qty 1

## 2014-10-23 MED ORDER — FENTANYL CITRATE (PF) 250 MCG/5ML IJ SOLN
INTRAMUSCULAR | Status: AC
  Filled 2014-10-23: qty 5

## 2014-10-23 MED ORDER — LIDOCAINE HCL (PF) 1 % IJ SOLN
INTRAMUSCULAR | Status: AC
  Filled 2014-10-23: qty 30

## 2014-10-23 MED ORDER — PHENYLEPHRINE HCL 10 MG/ML IJ SOLN
10.0000 mg | INTRAVENOUS | Status: DC | PRN
  Administered 2014-10-23: 20 ug/min via INTRAVENOUS

## 2014-10-23 MED ORDER — PROTAMINE SULFATE 10 MG/ML IV SOLN
INTRAVENOUS | Status: AC
  Filled 2014-10-23: qty 5

## 2014-10-23 MED ORDER — MAGNESIUM SULFATE 2 GM/50ML IV SOLN
2.0000 g | Freq: Every day | INTRAVENOUS | Status: DC | PRN
  Filled 2014-10-23: qty 50

## 2014-10-23 MED ORDER — HYDRALAZINE HCL 20 MG/ML IJ SOLN: 5.0000 mg | INTRAMUSCULAR | Status: DC | PRN

## 2014-10-23 MED ORDER — 0.9 % SODIUM CHLORIDE (POUR BTL) OPTIME
TOPICAL | Status: DC | PRN
  Administered 2014-10-23: 2000 mL

## 2014-10-23 MED ORDER — SODIUM CHLORIDE 0.9 % IV SOLN
1000.0000 ug | INTRAVENOUS | Status: DC | PRN
  Administered 2014-10-23: .25 ug/kg/min via INTRAVENOUS

## 2014-10-23 MED ORDER — GUAIFENESIN-DM 100-10 MG/5ML PO SYRP
15.0000 mL | ORAL_SOLUTION | ORAL | Status: DC | PRN
Start: 2014-10-23 — End: 2014-10-29

## 2014-10-23 MED ORDER — OXYCODONE HCL 5 MG/5ML PO SOLN: 5.0000 mg | Freq: Once | ORAL | Status: DC | PRN

## 2014-10-23 MED ORDER — DEXTROSE 5 % IV SOLN
1.5000 g | Freq: Two times a day (BID) | INTRAVENOUS | Status: AC
  Administered 2014-10-23 – 2014-10-24 (×2): 1.5 g via INTRAVENOUS
  Filled 2014-10-23 (×2): qty 1.5

## 2014-10-23 MED ORDER — ONDANSETRON HCL 4 MG/2ML IJ SOLN: 4.0000 mg | Freq: Four times a day (QID) | INTRAMUSCULAR | Status: DC | PRN

## 2014-10-23 MED ORDER — PANTOPRAZOLE SODIUM 40 MG PO TBEC
40.0000 mg | DELAYED_RELEASE_TABLET | Freq: Every day | ORAL | Status: DC
  Administered 2014-10-23 – 2014-10-28 (×6): 40 mg via ORAL
  Filled 2014-10-23 (×6): qty 1

## 2014-10-23 MED ORDER — LABETALOL HCL 5 MG/ML IV SOLN: 10.0000 mg | INTRAVENOUS | Status: DC | PRN

## 2014-10-23 MED ORDER — PROPOFOL 10 MG/ML IV BOLUS
INTRAVENOUS | Status: DC | PRN
  Administered 2014-10-23: 30 mg via INTRAVENOUS
  Administered 2014-10-23: 50 mg via INTRAVENOUS

## 2014-10-23 MED ORDER — ONDANSETRON HCL 4 MG/2ML IJ SOLN
INTRAMUSCULAR | Status: DC | PRN
  Administered 2014-10-23: 4 mg via INTRAVENOUS

## 2014-10-23 MED ORDER — FENTANYL CITRATE (PF) 100 MCG/2ML IJ SOLN
INTRAMUSCULAR | Status: AC
  Filled 2014-10-23: qty 2

## 2014-10-23 MED ORDER — ESMOLOL HCL 10 MG/ML IV SOLN
INTRAVENOUS | Status: DC | PRN
  Administered 2014-10-23: 30 mg via INTRAVENOUS

## 2014-10-23 MED ORDER — POTASSIUM CHLORIDE CRYS ER 20 MEQ PO TBCR: 20.0000 meq | EXTENDED_RELEASE_TABLET | Freq: Every day | ORAL | Status: DC | PRN

## 2014-10-23 SURGICAL SUPPLY — 48 items
APL SKNCLS STERI-STRIP NONHPOA (GAUZE/BANDAGES/DRESSINGS) ×1
BENZOIN TINCTURE PRP APPL 2/3 (GAUZE/BANDAGES/DRESSINGS) ×3 IMPLANT
CANISTER SUCTION 2500CC (MISCELLANEOUS) ×3 IMPLANT
CANNULA VESSEL 3MM 2 BLNT TIP (CANNULA) IMPLANT
CATH ROBINSON RED A/P 18FR (CATHETERS) ×3 IMPLANT
CLIP LIGATING EXTRA MED SLVR (CLIP) ×3 IMPLANT
CLIP LIGATING EXTRA SM BLUE (MISCELLANEOUS) ×3 IMPLANT
CLOSURE WOUND 1/2 X4 (GAUZE/BANDAGES/DRESSINGS) ×1
CRADLE DONUT ADULT HEAD (MISCELLANEOUS) ×3 IMPLANT
DECANTER SPIKE VIAL GLASS SM (MISCELLANEOUS) IMPLANT
DRAIN HEMOVAC 1/8 X 5 (WOUND CARE) IMPLANT
DRSG COVADERM 4X8 (GAUZE/BANDAGES/DRESSINGS) ×2 IMPLANT
ELECT REM PT RETURN 9FT ADLT (ELECTROSURGICAL) ×3
ELECTRODE REM PT RTRN 9FT ADLT (ELECTROSURGICAL) ×1 IMPLANT
EVACUATOR SILICONE 100CC (DRAIN) IMPLANT
GAUZE SPONGE 4X4 12PLY STRL (GAUZE/BANDAGES/DRESSINGS) ×3 IMPLANT
GEL ULTRASOUND 20GR AQUASONIC (MISCELLANEOUS) IMPLANT
GLOVE BIO SURGEON STRL SZ 6.5 (GLOVE) ×1 IMPLANT
GLOVE BIO SURGEONS STRL SZ 6.5 (GLOVE) ×1
GLOVE BIOGEL PI IND STRL 6.5 (GLOVE) IMPLANT
GLOVE BIOGEL PI IND STRL 7.5 (GLOVE) IMPLANT
GLOVE BIOGEL PI INDICATOR 6.5 (GLOVE) ×2
GLOVE BIOGEL PI INDICATOR 7.5 (GLOVE) ×2
GLOVE ECLIPSE 7.0 STRL STRAW (GLOVE) ×2 IMPLANT
GLOVE SS BIOGEL STRL SZ 7.5 (GLOVE) ×1 IMPLANT
GLOVE SUPERSENSE BIOGEL SZ 7.5 (GLOVE) ×2
GOWN STRL REUS W/ TWL LRG LVL3 (GOWN DISPOSABLE) ×3 IMPLANT
GOWN STRL REUS W/TWL LRG LVL3 (GOWN DISPOSABLE) ×9
KIT BASIN OR (CUSTOM PROCEDURE TRAY) ×3 IMPLANT
KIT ROOM TURNOVER OR (KITS) ×3 IMPLANT
NEEDLE 22X1 1/2 (OR ONLY) (NEEDLE) IMPLANT
NS IRRIG 1000ML POUR BTL (IV SOLUTION) ×6 IMPLANT
PACK CAROTID (CUSTOM PROCEDURE TRAY) ×3 IMPLANT
PAD ARMBOARD 7.5X6 YLW CONV (MISCELLANEOUS) ×6 IMPLANT
PATCH HEMASHIELD 8X75 (Vascular Products) ×2 IMPLANT
SHUNT CAROTID BYPASS 10 (VASCULAR PRODUCTS) ×2 IMPLANT
SHUNT CAROTID BYPASS 12FRX15.5 (VASCULAR PRODUCTS) IMPLANT
SPONGE INTESTINAL PEANUT (DISPOSABLE) ×1 IMPLANT
STRIP CLOSURE SKIN 1/2X4 (GAUZE/BANDAGES/DRESSINGS) ×2 IMPLANT
SUT ETHILON 3 0 PS 1 (SUTURE) IMPLANT
SUT PROLENE 6 0 CC (SUTURE) ×3 IMPLANT
SUT SILK 3 0 (SUTURE)
SUT SILK 3-0 18XBRD TIE 12 (SUTURE) IMPLANT
SUT VIC AB 3-0 SH 27 (SUTURE) ×6
SUT VIC AB 3-0 SH 27X BRD (SUTURE) ×2 IMPLANT
SUT VICRYL 4-0 PS2 18IN ABS (SUTURE) ×3 IMPLANT
SYR CONTROL 10ML LL (SYRINGE) IMPLANT
WATER STERILE IRR 1000ML POUR (IV SOLUTION) ×1 IMPLANT

## 2014-10-23 NOTE — Progress Notes (Signed)
      Right neck dressing clean and dry Smile is symmetric, grip 5/5 speech clear No tongue derivation  S/P right CEA Stable waiting for step down room in PACU  COLLINS, EMMA MAUREEN PA-C

## 2014-10-23 NOTE — Progress Notes (Signed)
SLP Cancellation Note  Patient Details Name: Oscar Santiago MRN: [id number] DOB: 11-Oct-1925   Cancelled treatment:       Reason Eval/Treat Not Completed: Patient at procedure or test/unavailable.  SLP will follow up as able.  Fae Pippin, M.A., CCC-SLP [phone]  BOWIE,MELISSA 10/23/2014, 11:31 AM

## 2014-10-23 NOTE — Progress Notes (Signed)
Patient in surgery-- will see after Marlin Canary DO

## 2014-10-23 NOTE — Care Management Note (Signed)
Case Management Note  Patient Details  Name: LEA WALBERT MRN: [id number] Date of Birth: 10-15-1925  Subjective/Objective:                    Action/Plan: Patient admitted with TIA. Pt going for Carotid Endarterectomy today. Pt lives at home alone. CM will continue to follow for discharge needs.  Expected Discharge Date:                  Expected Discharge Plan:  Home/Self Care  In-House Referral:     Discharge planning Services     Post Acute Care Choice:    Choice offered to:     DME Arranged:    DME Agency:     HH Arranged:    HH Agency:     Status of Service:  In process, will continue to follow  Medicare Important Message Given:    Date Medicare IM Given:    Medicare IM give by:    Date Additional Medicare IM Given:    Additional Medicare Important Message give by:     If discussed at Long Length of Stay Meetings, dates discussed:    Additional Comments:  Florene Glen, RN 10/23/2014, 11:12 AM

## 2014-10-23 NOTE — Transfer of Care (Signed)
Immediate Anesthesia Transfer of Care Note  Patient: Oscar Santiago  Procedure(s) Performed: Procedure(s): ENDARTERECTOMY CAROTID (Right)  Patient Location: PACU  Anesthesia Type:General  Level of Consciousness: awake, alert  and oriented  Airway & Oxygen Therapy: Patient Spontanous Breathing and Patient connected to nasal cannula oxygen  Post-op Assessment: Report given to RN, Post -op Vital signs reviewed and stable, Patient moving all extremities, Patient moving all extremities X 4 and Patient able to stick tongue midline  Post vital signs: Reviewed and stable  Last Vitals:  Filed Vitals:   10/23/14 0945  BP: 170/78  Pulse: 69  Temp:   Resp:     Complications: No apparent anesthesia complications

## 2014-10-23 NOTE — Progress Notes (Signed)
Pt placed on Tele to monitor

## 2014-10-23 NOTE — H&P (View-Only) (Signed)
       Consult Note  Patient name: Oscar Santiago MRN: [id number] DOB: 07/10/1925 Sex: male  Consulting Physician:  Hospitalist service  Reason for Consult:  Chief Complaint  Patient presents with  . Code Stroke    HISTORY OF PRESENT ILLNESS: This is a very pleasant 81-year-old gentleman who presented to the hospital on 10/20/2014 with left arm weakness.  He stated that he went to button his shirt and could not.  He then fell to the floor with his left leg giving out.  Upon arrival to the hospital he was found to have a facial droop and dysarthria.  His symptoms rapidly improved and therefore he did not receive TPA.  His initial CT scan showed no acute intracranial abnormality.  He is not a candidate for MRI because of his pacemaker.  CT angiogram of the neck revealed 70% stenosis of the proximal right internal carotid artery and 80% stenosis of the left internal carotid artery.  He is now back to his baseline.  The patient has a history of a seizure disorder he has not had a seizure for approximately 3 weeks.  He has a history of atrial fibrillation but has declined taking anticoagulation.  He is medically managed for hypertension.  He is on a statin for hypercholesterolemia.  He is a nonsmoker.  He has a history of complete heart block and has a pacemaker in place.  He has stable stage III kidney disease  Past Medical History  Diagnosis Date  . Hypertension   . Seizure disorder   . Glaucoma   . Hyperlipidemia   . Osteopenia   . Compression fracture of spine     T 10 and T11  . Gout   . OSA (obstructive sleep apnea)     on CPAP therapy  . Hypothyroid   . Inguinal hernia   . Hemorrhoid   . Chronic systolic dysfunction of left ventricle     EF 30-35% by echo 10/10/2009  . Major depression   . History of GI bleed     from coumadin  . MR (mitral regurgitation)   . Hearing loss   . Dizziness     intermittent  . Kidney stone   . CKD (chronic kidney disease), stage III   .  Permanent atrial fibrillation     refuses coumadin due to history of GI bleed  . Decreased appetite 03/02/12  . Weight loss 03/02/12  . Complete heart block   . CHF (congestive heart failure)     Past Surgical History  Procedure Laterality Date  . Transurethral resection of prostate    . Cardiac catheterization  2002  . Pacemaker insertion  07/28/06    VVI pacemaker for complete heart block by Dr Edmunds  . Inguinal hernia repair    . Hemorrhoid surgery    . Colonoscopy with propofol N/A 05/01/2014    Procedure: COLONOSCOPY WITH PROPOFOL;  Surgeon: Carl E Gessner, MD;  Location: [facility];  Service: Endoscopy;  Laterality: N/A;    Social History   Social History  . Marital Status: Widowed    Spouse Name: N/A  . Number of Children: 6  . Years of Education: N/A   Occupational History  . Retired     Pastor   Social History Main Topics  . Smoking status: Never Smoker   . Smokeless tobacco: Never Used  . Alcohol Use: No  . Drug Use: No  . Sexual Activity: Not on file   Other   Topics Concern  . 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 Smith - Cardiology   Dr. Gessner - Gastroenterology   Dr. Nesi - Urology   [facility]    Family History  Problem Relation Age of Onset  . Heart failure Mother   . CVA Sister   . Aneurysm Brother     brain  . Colon cancer Neg Hx   . Colon polyps Neg Hx     Allergies as of 10/20/2014 - Review Complete 10/20/2014  Allergen Reaction Noted  . Coumadin [warfarin sodium] Other (See Comments) 12/24/2012  . Lisinopril Cough 12/24/2012    No current facility-administered medications on file prior to encounter.   Current Outpatient Prescriptions on File Prior to Encounter  Medication Sig Dispense Refill  . alendronate (FOSAMAX) 70 MG tablet Take 70 mg by mouth every Monday.     .  amLODipine (NORVASC) 10 MG tablet Take 5 mg by mouth daily as needed (diastolic blood pressure over 90).     . cetirizine (ZYRTEC) 10 MG tablet Take 10 mg by mouth daily.    . colchicine 0.6 MG tablet TAKE 1 TABLET TWICE DAILY (Patient taking differently: TAKE 1 TABLET TWICE DAILY AS NEEDED FOR GOUT) 30 tablet 4  . Dextromethorphan-Guaifenesin (CORICIDIN HBP CONGESTION/COUGH) 10-200 MG CAPS Take 1 capsule by mouth daily as needed (for cold).     . dorzolamide-timolol (COSOPT) 22.3-6.8 MG/ML ophthalmic solution Place 1 drop into both eyes 2 (two) times daily.  6  . famotidine (PEPCID AC) 10 MG chewable tablet Chew 10 mg by mouth daily as needed for heartburn.     . fluticasone (FLONASE) 50 MCG/ACT nasal spray Place 2 sprays into both nostrils daily as needed for allergies or rhinitis. \    . furosemide (LASIX) 20 MG tablet Take 1 tablet (20 mg total) by mouth daily. 30 tablet 11  . latanoprost (XALATAN) 0.005 % ophthalmic solution Place 1 drop into both eyes at bedtime.     . levothyroxine (SYNTHROID, LEVOTHROID) 25 MCG tablet TAKE 1 TABLET (25 MCG TOTAL) BY MOUTH DAILY BEFORE BREAKFAST. 90 tablet 1  . valsartan (DIOVAN) 320 MG tablet Take 1 tablet (320 mg total) by mouth daily with lunch. 30 tablet 11  . zolpidem (AMBIEN) 5 MG tablet TAKE 1 TABLET AT BEDTIME (Patient taking differently: TAKE 1/2 TABLET BY MOUTH AT BEDTIME) 30 tablet 2  . lacosamide (VIMPAT) 50 MG TABS tablet Take 1 tablet (50 mg total) by mouth 2 (two) times daily. (Patient not taking: Reported on 10/20/2014) 60 tablet 0     REVIEW OF SYSTEMS: In addition to the HPI above,  No Fever-chills, myalgias or other constitutional symptoms No Headache, changes with Vision or hearing No problems swallowing food or Liquids, indigestion/reflux No Chest pain, Cough or Shortness of Breath, palpitations, orthopnea or DOE No Abdominal pain, N/V; no melena or hematochezia, no dark tarry stools, Bowel movements are regular, No dysuria,  hematuria or flank pain No new skin rashes, lesions, masses or bruises, No new joints pains-aches No recent weight gain or loss No polyuria, polydypsia or polyphagia,  PHYSICAL EXAMINATION: General: The patient appears their stated age.  Vital signs are BP 134/63 mmHg  Pulse 110  Temp(Src) 97 F (36.1 C) (Oral)  Resp 18  Wt 188   lb 2 oz (85.333 kg)  SpO2 100% Pulmonary: Respirations are non-labored HEENT:  No gross abnormalities Abdomen: Soft and non-tender  Musculoskeletal: There are no major deformities.   Neurologic: No focal weakness or paresthesias are detected, Skin: There are no ulcer or rashes noted. Psychiatric: The patient has normal affect. Cardiovascular: There is a regular rate and rhythm without significant murmur appreciated.  Diagnostic Studies: I have reviewed his CT scan which shows the following findings: No CT or CTA evidence of acute intracranial infarction.  70% stenosis of the proximal right internal carotid artery.  80% stenosis of the proximal left internal carotid artery.  Atherosclerotic disease in the carotid siphon regions with maximal stenosis estimated at 30-40% on both sides  Assessment:  Right brain TIA versus stroke Plan: It is not 100% clear the true etiology of his neurologic episode.  This could be related to his atrial fibrillation, for which he is not anticoagulated, or it could be secondary to his right carotid stenosis.  Given that the true etiology cannot be determined is reasonable to proceed with right carotid endarterectomy in order to decrease his risk for stroke.  I discussed with the patient and his family including his power of attorney, the risks and benefits of carotid endarterectomy which include but are not limited to the risk of bleeding, stroke, and nerve injury.  He has received cardiology evaluation who feels that he is at moderate risk.  Therefore we will proceed with carotid endarterectomy on the right tomorrow by Dr.  early.  All questions of the family and patient were answered in detail.  Plan to start anti-coagulation after his surgery     V. Wells Brabham IV, M.D. [facility] Office: [phone] Pager:  [phone] 

## 2014-10-23 NOTE — Op Note (Signed)
     Patient name: Oscar Santiago MRN: [id number] DOB: [birth year] Sex: male  10/20/2014 - 10/23/2014 Pre-operative Diagnosis: Symptomatic right carotid stenosis Post-operative diagnosis:  Same Surgeon:  Larina Earthly, M.D. Assistants:  Thomasena Edis Procedure:    right carotid Endarterectomy with Dacron patch angioplasty Anesthesia:  General Blood Loss:  See anesthesia record   Indications for surgery:  TIA  Procedure in detail:  The patient was taken to the operating and placed in the supine position. The neck was prepped and draped in the usual sterile fashion. An incision was made anterior to the sternocleidomastoid muscle and continued with electrocautery through the platysma muscle. The muscle was retracted posteriorly and the carotid sheath was opened. The facial vein was ligated with 2-0 silk ties and divided. The common carotid artery was encircled with an umbilical tape and Rummel tourniquet. Dissection was continued onto the carotid bifurcation. The superior thyroid artery was controlled with a 2-0 silk Potts tie. The external carotid organ was encircled with a vessel loop and the internal carotid was encircled with umbilical tape and Rummel tourniquet. The hypoglossal and vagus nerves were identified and preserved.  The patient was given systemic heparinization. After adequate circulation time, the internal,external and common carotid arteries were occluded. The common carotid was opened with an 11 blade and the arteriotomy was continued with Potts scissors onto the internal carotid artery. A 10 shunt was passed up the internal carotid artery, allowed to back bleed, and then passed down the common carotid artery. The shunt was secured with Rummel tourniquet. The endarterectomy was begun on the common carotid artery  plaque was divided proximally with Potts scissors. The endarterectomy was continued onto the carotid bifurcation. The external carotid was endarterectomized by eversion technique and  the internal carotid artery was endarterectomized in an open fashion. Remaining debris was removed from the endarterectomy plane. A Dacron patch was brought to the field and sewn as a patch angioplasty. Prior to completing the anastomosis, the shunt was removed and the usual flushing maneuvers were undertaken. The anastomosis was then completed and flow was restored first to the external and then the internal carotid artery. Excellent flow characteristics were noted with hand-held Doppler in the internal and external carotid arteries.  The patient was given protamine to reverse the heparin. Hemostasis was obtained with electrocautery. The wounds were irrigated with saline. The wound was closed by first reapproximating the sternocleidomastoid muscle over the carotid artery with interrupted 3-0 Vicryl sutures. Next, the platysma was closed with a running 3-0 Vicryl suture. The skin was closed with a 4-0 subcuticular Vicryl suture. Benzoin and Steri-Strips were applied to the incision. A sterile dressing was placed over the incision. All sponge and needle counts were correct. The patient was awakened in the operating room, neurologically intact. They were transferred to the PACU in stable condition.  Carotid stenosis at surgery:80%  Disposition:  To PACU in stable condition,neurologically intact  Relevant Operative Details:  Low bifurcation  Larina Earthly, M.D. [facility] Office: [phone] Pager:  [phone]

## 2014-10-23 NOTE — Interval H&P Note (Signed)
History and Physical Interval Note:  10/23/2014 9:42 AM  Oscar Santiago  has presented today for surgery, with the diagnosis of STROKE  The various methods of treatment have been discussed with the patient and family. After consideration of risks, benefits and other options for treatment, the patient has consented to  Procedure(s): ENDARTERECTOMY CAROTID (Right) as a surgical intervention .  The patient's history has been reviewed, patient examined, no change in status, stable for surgery.  I have reviewed the patient's chart and labs.  Questions were answered to the patient's satisfaction.     [name]

## 2014-10-23 NOTE — Anesthesia Procedure Notes (Signed)
Procedure Name: Intubation Date/Time: 10/23/2014 11:09 AM Performed by: Leonel Ramsay Pre-anesthesia Checklist: Patient identified, Timeout performed, Emergency Drugs available, Suction available and Patient being monitored Patient Re-evaluated:Patient Re-evaluated prior to inductionOxygen Delivery Method: Circle system utilized Preoxygenation: Pre-oxygenation with 100% oxygen Intubation Type: IV induction Ventilation: Mask ventilation without difficulty Laryngoscope Size: Mac and 4 Grade View: Grade I Tube type: Oral Tube size: 7.5 mm Number of attempts: 1 Airway Equipment and Method: Stylet Placement Confirmation: ETT inserted through vocal cords under direct vision,  positive ETCO2 and breath sounds checked- equal and bilateral Secured at: 22 cm Tube secured with: Tape Dental Injury: Teeth and Oropharynx as per pre-operative assessment

## 2014-10-23 NOTE — Progress Notes (Signed)
PT Cancellation Note:  Pt currently in surgery. Will check back late afternoon or tomorrow.   Lyanne Co, PT  Acute Rehab Services  [phone]

## 2014-10-23 NOTE — Anesthesia Preprocedure Evaluation (Addendum)
Anesthesia Evaluation  Patient identified by MRN, date of birth, ID band Patient awake    Reviewed: Allergy & Precautions, NPO status , Patient's Chart, lab work & pertinent test results  History of Anesthesia Complications Negative for: history of anesthetic complications  Airway Mallampati: II  TM Distance: >3 FB Neck ROM: Full    Dental  (+) Partial Upper, Dental Advisory Given   Pulmonary sleep apnea ,    breath sounds clear to auscultation       Cardiovascular hypertension, +CHF  + dysrhythmias Atrial Fibrillation + pacemaker + Valvular Problems/Murmurs MR  Rhythm:Irregular     Neuro/Psych Depression TIACVA    GI/Hepatic negative GI ROS, Neg liver ROS,   Endo/Other  Hypothyroidism   Renal/GU Renal InsufficiencyRenal disease     Musculoskeletal   Abdominal   Peds  Hematology negative hematology ROS (+)   Anesthesia Other Findings   Reproductive/Obstetrics                            Anesthesia Physical Anesthesia Plan  ASA: IV  Anesthesia Plan: General   Post-op Pain Management:    Induction: Intravenous  Airway Management Planned: Oral ETT  Additional Equipment: Arterial line  Intra-op Plan:   Post-operative Plan: Extubation in OR  Informed Consent: I have reviewed the patients History and Physical, chart, labs and discussed the procedure including the risks, benefits and alternatives for the proposed anesthesia with the patient or authorized representative who has indicated his/her understanding and acceptance.   Dental advisory given  Plan Discussed with: Surgeon and CRNA  Anesthesia Plan Comments:        Anesthesia Quick Evaluation

## 2014-10-24 ENCOUNTER — Encounter (HOSPITAL_COMMUNITY): Payer: Self-pay | Admitting: Vascular Surgery

## 2014-10-24 DIAGNOSIS — R531 Weakness: Secondary | ICD-10-CM

## 2014-10-24 DIAGNOSIS — M6289 Other specified disorders of muscle: Secondary | ICD-10-CM

## 2014-10-24 LAB — URINALYSIS, ROUTINE W REFLEX MICROSCOPIC
Glucose, UA: NEGATIVE mg/dL
KETONES UR: 15 mg/dL — AB
NITRITE: NEGATIVE
PH: 5.5 (ref 5.0–8.0)
Protein, ur: 100 mg/dL — AB
SPECIFIC GRAVITY, URINE: 1.007 (ref 1.005–1.030)
UROBILINOGEN UA: 1 mg/dL (ref 0.0–1.0)

## 2014-10-24 LAB — BASIC METABOLIC PANEL
ANION GAP: 6 (ref 5–15)
BUN: 13 mg/dL (ref 6–20)
CO2: 22 mmol/L (ref 22–32)
Calcium: 8.2 mg/dL — ABNORMAL LOW (ref 8.9–10.3)
Chloride: 105 mmol/L (ref 101–111)
Creatinine, Ser: 1.28 mg/dL — ABNORMAL HIGH (ref 0.61–1.24)
GFR calc Af Amer: 56 mL/min — ABNORMAL LOW (ref >60)
GFR, EST NON AFRICAN AMERICAN: 49 mL/min — AB (ref >60)
GLUCOSE: 113 mg/dL — AB (ref 65–99)
POTASSIUM: 4.5 mmol/L (ref 3.5–5.1)
Sodium: 133 mmol/L — ABNORMAL LOW (ref 135–145)

## 2014-10-24 LAB — CBC
HEMATOCRIT: 38.3 % — AB (ref 39.0–52.0)
Hemoglobin: 12.9 g/dL — ABNORMAL LOW (ref 13.0–17.0)
MCH: 28.7 pg (ref 26.0–34.0)
MCHC: 33.7 g/dL (ref 30.0–36.0)
MCV: 85.1 fL (ref 78.0–100.0)
PLATELETS: 124 10*3/uL — AB (ref 150–400)
RBC: 4.5 MIL/uL (ref 4.22–5.81)
RDW: 15.8 % — AB (ref 11.5–15.5)
WBC: 5.3 10*3/uL (ref 4.0–10.5)

## 2014-10-24 LAB — URINE MICROSCOPIC-ADD ON

## 2014-10-24 MED ORDER — LIDOCAINE HCL 2 % EX GEL
1.0000 "application " | Freq: Once | CUTANEOUS | Status: DC
  Filled 2014-10-24: qty 5

## 2014-10-24 MED ORDER — INFLUENZA VAC SPLIT QUAD 0.5 ML IM SUSY
0.5000 mL | PREFILLED_SYRINGE | INTRAMUSCULAR | Status: AC
  Administered 2014-10-25: 0.5 mL via INTRAMUSCULAR
  Filled 2014-10-24: qty 0.5

## 2014-10-24 MED ORDER — TAMSULOSIN HCL 0.4 MG PO CAPS
0.4000 mg | ORAL_CAPSULE | Freq: Every day | ORAL | Status: DC
  Administered 2014-10-24 – 2014-10-28 (×5): 0.4 mg via ORAL
  Filled 2014-10-24 (×6): qty 1

## 2014-10-24 NOTE — Progress Notes (Signed)
Writer attempted to place a 38fr Coude catheter per order without success.  Patient tolerated procedure well.  Patient's nurse made aware of this.

## 2014-10-24 NOTE — Progress Notes (Signed)
[facility] Progress Note   Oscar Santiago  ZOX:[id number]  DOB: 1925/09/10  DOA: 10/20/2014 PCP: Kristian Covey, MD  Brief narrative: Oscar Santiago is a 81 y.o. male with hypertension, seizure disorder, dyslipidemia, sleep apnea, gout, hypothyroidism, chronic kidney disease stage III, complete heart block status post pacemaker, A. fib not on anticoagulation. The patient presents with a complaint of left-sided weakness. He specifically states that his left arm was weak she noted while buttoning his shirt and he subsequently slid off the bed and fell to the ground-left arm weakness improved slowly to a point where he was able to make a phone call. EMS noted left-sided did weakness, dysarthria and facial droop. In the ER he was found to have left arm weakness which according to the patient improved by the time he was moved out of the ER.  Subjective: Unable to urinate Nursing tried straight cath x 2 unable to place-- urology called for coude   Assessment/Plan:   TIA (transient ischemic attack) -Unable to obtain MRI due to his pacemaker but CT of the head does not show an acute infarct - found to have significant carotid artery stenosis on CTA-s/p CEA 9/19 - neurology also is suspicious that he may have had a seizure and recommending to continue Keppra  -Neurology recommending to start eliquis 5 mg twice a day for A. fib for secondary stroke prevention--will start when OK with vascular surgery - LDL 105--continue statin  Bilateral carotid artery disease -s/ CEA    Chronic atrial fibrillation (CHADVASc = 5) -Apparently had some GI bleeding back in the 1990s -he states he has not had any bleeding since-no history of PUD either  - Dr. Butler Denmark discussed starting anticoagulation for stroke prevention and he is in agreement with this -Start anticoagulation after carotid endarterectomy when ok with vacular  Chronic systolic heart failure -EF 40%-continue low-dose Lasix and watch I  and O -On Diovan at home - cont Metoprolol-started by cardiology 9/17 -Also has Moderate mitral regurgitation and tricuspid regurgitation    Hypertension -Continue current medications  Seizure disorder Continue Keppra per neurology    Hyperlipidemia -Continue pravastatin    OSA (obstructive sleep apnea) -Will order C Pap    Cardiac pacemaker in situ    CKD (chronic kidney disease), stage III - stable    Hypothyroidism --Continue Synthroid  Mild thrombocytopenia and leukopenia -outpatient follow up  Acute urinary retention -start flomax -urology to place coude  Code Status:     Code Status Orders        Start     Ordered   10/20/14 1156  Full code   Continuous     10/20/14 1155     Family Communication: no family at DVT prophylaxis: Lovenox Consultants: Neurology, vascular surgery, cardiology, urology  Antibiotics: Anti-infectives    Start     Dose/Rate Route Frequency Ordered Stop   10/23/14 2000  cefUROXime (ZINACEF) 1.5 g in dextrose 5 % 50 mL IVPB     1.5 g 100 mL/hr over 30 Minutes Intravenous Every 12 hours 10/23/14 1759 10/24/14 1959   10/23/14 0800  ceFAZolin (ANCEF) IVPB 1 g/50 mL premix    Comments:  Send with pt to OR   1 g 100 mL/hr over 30 Minutes Intravenous To ShortStay Surgical 10/22/14 1614 10/23/14 1115      Objective: Filed Weights   10/22/14 0725 10/23/14 1952 10/24/14 0329  Weight: 85.333 kg (188 lb 2 oz) 90.1 kg (198 lb 10.2 oz) 86.4 kg (190 lb 7.6  oz)    Intake/Output Summary (Last 24 hours) at 10/24/14 0752 Last data filed at 10/24/14 0330  Gross per 24 hour  Intake 855.75 ml  Output     50 ml  Net 805.75 ml     Vitals Filed Vitals:   10/23/14 1808 10/23/14 1952 10/23/14 2322 10/24/14 0329  BP:  126/62 126/58 143/67  Pulse:  41 60 56  Temp:  98.1 F (36.7 C) 98.7 F (37.1 C) 98.3 F (36.8 C)  TempSrc:  Oral Oral Oral  Resp: Height:   (1.753 m)   (1.753 m)  Weight:  90.1 kg (198 lb 10.2  oz)  86.4 kg (190 lb 7.6 oz)  SpO2:  99% 100% 100%    Exam:  General:  Pt is alert, not in acute distress  HEENT: No icterus, No thrush, oral mucosa moist  Cardiovascular:IIRR, S1/S2 No murmur  Respiratory: clear to auscultation bilaterally   Abdomen: Soft, +Bowel sounds, non tender, non distended, no guarding  MSK: No LE edema, cyanosis or clubbing  Data Reviewed: Basic Metabolic Panel:  Recent Labs Lab 10/20/14 0903 10/20/14 0909 10/21/14 0713 10/23/14 0536 10/24/14 0350  NA 135 138 136 133* 133*  K 4.0 3.9 3.8 3.6 4.5  CL 106 103 105 102 105  CO2 21*  --  GLUCOSE 94 93 94 95 113*  BUN CREATININE 1.33* 1.40* 1.24 1.24 1.28*  CALCIUM 8.9  --  8.6* 8.8* 8.2*   Liver Function Tests:  Recent Labs Lab 10/20/14 0903  AST 37  ALT 21  ALKPHOS 56  BILITOT 0.8  PROT 6.8  ALBUMIN 3.7   No results for input(s): LIPASE, AMYLASE in the last 168 hours. No results for input(s): AMMONIA in the last 168 hours. CBC:  Recent Labs Lab 10/20/14 0903 10/20/14 0909 10/21/14 0713 10/22/14 0617 10/23/14 0536 10/24/14 0350  WBC 2.7*  --  2.9* 2.9* 3.5* 5.3  NEUTROABS 1.0*  --   --  0.9*  --   --   HGB 13.3 15.0 13.1 12.6* 13.7 12.9*  HCT 38.7* 44.0 38.6* 37.1* 40.4 38.3*  MCV 84.1  --  84.6 85.3 84.7 85.1  PLT 124*  --  126* 119* 121* 124*   Cardiac Enzymes: No results for input(s): CKTOTAL, CKMB, CKMBINDEX, TROPONINI in the last 168 hours. BNP (last 3 results) No results for input(s): BNP in the last 8760 hours.  ProBNP (last 3 results) No results for input(s): PROBNP in the last 8760 hours.  CBG:  Recent Labs Lab 10/20/14 0922 10/23/14 0858  GLUCAP 91 79    Recent Results (from the past 240 hour(s))  Surgical pcr screen     Status: None   Collection Time: 10/22/14  9:18 PM  Result Value Ref Range Status   MRSA, PCR NEGATIVE NEGATIVE Final   Staphylococcus aureus NEGATIVE NEGATIVE Final    Comment:        The Xpert SA Assay  (FDA approved for NASAL specimens in patients over 36 years of age), is one component of a comprehensive surveillance program.  Test performance has been validated by [facility] for patients greater than or equal to 72 year old. It is not intended to diagnose infection nor to guide or monitor treatment.      Studies: No results found.  Scheduled Meds:  Scheduled Meds: . aspirin  300 mg Rectal Daily   Or  . aspirin  325 mg Oral Daily  . cefUROXime (ZINACEF)  IV  1.5 g Intravenous Q12H  . docusate sodium  100 mg Oral Daily  . dorzolamide-timolol  1 drop Both Eyes BID  . enoxaparin (LOVENOX) injection  40 mg Subcutaneous Daily  . furosemide  20 mg Oral Daily  . [START ON 10/25/2014] Influenza vac split quadrivalent PF  0.5 mL Intramuscular Tomorrow-1000  . irbesartan  37.5 mg Oral Daily  . latanoprost  1 drop Both Eyes QHS  . levETIRAcetam  250 mg Oral q morning - 10a  . levETIRAcetam  500 mg Oral QHS  . levothyroxine  25 mcg Oral QAC breakfast  . loratadine  10 mg Oral Daily  . metoprolol succinate  25 mg Oral Daily  . multivitamin with minerals  1 tablet Oral Daily  . pantoprazole  40 mg Oral Daily  . pravastatin  20 mg Oral q1800  . tamsulosin  0.4 mg Oral QPC breakfast  . zolpidem  2.5 mg Oral QHS   Continuous Infusions: . lactated ringers 10 mL/hr at 10/23/14 1610    Time spent on care of this patient: 25 min   Saphia Vanderford, DO  10/24/2014, 7:52 AM  LOS: 2 days   [facility] Office  [phone] Pager - Text Page per [email or web address] If 7PM-7AM, please contact night-coverage [email or web address]

## 2014-10-24 NOTE — Progress Notes (Signed)
Patient stated he does not want to wear CPAP tonight due to an incision on his neck that continues to be sore. RT informed patient if he changes his mind have RN contact RT, and that we would continue to check each night to see if he was ready to wear it.

## 2014-10-24 NOTE — Care Management Note (Addendum)
Case Management Note  Patient Details  Name: Oscar Santiago MRN: [id number] Date of Birth: July 25, 1925  Subjective/Objective:                 Admitted with TIA (transient ischemic attack). S/P R CAROTID  Endarterectomy 10/23/14.  Action/Plan:  Pt unable to void  postoperatively, awaiting urology to consult. Return to home when medically stable. CM to f/u with d/c needs.  Expected Discharge Date:                  Expected Discharge Plan:  Home/Self Care  In-House Referral:     Discharge planning Services     Post Acute Care Choice:    Choice offered to:     DME Arranged:    DME Agency:     HH Arranged:    HH Agency:     Status of Service:  In process, will continue to follow  Medicare Important Message Given:    Date Medicare IM Given:    Medicare IM give by:    Date Additional Medicare IM Given:    Additional Medicare Important Message give by:     If discussed at Long Length of Stay Meetings, dates discussed:    Additional Comments:  CM awaiting various  Evaluations by PT/OT/SLP  for potential home health needs. [address] [name]) [name]  [phone]  [name] [place], [place] [phone] 10/24/2014, 10:27 AM

## 2014-10-24 NOTE — Progress Notes (Signed)
Pt arrived to unit alert and oriented. Oriented to room. Call bell at side. Bed alarm activated. Questions answered. Will continue to monitor. Hayles, Imani M, RN 

## 2014-10-24 NOTE — Progress Notes (Signed)
STROKE TEAM PROGRESS NOTE  HPI Oscar Santiago is an 81 y.o. male presenting to the hospital. Patient awoke at 0700 hours and felt normal. He went to put on his shirt and noted his left arm was week and then fell due to his left leg giving out. EMS was called to [facility] for a fall but on arrival noted he had left sided plegia and facial droop. He was also noted to have dysarthria. On arrival to ED his dysarthria resolved but continued to have left arm weakness but NOT plegia. He only had a left arm drift.   Date last known well: Date: 10/20/2014 Time last known well: Time: 07:00 tPA Given: No: significant improvement since arrived in hospital  SUBJECTIVE (INTERVAL HISTORY) His brother is at the bedside.  Overall he feels his condition is  Stable. He had right carotid endarterectomy yesterday which was uneventful and is doing well. He wants to go home. OBJECTIVE Temp:  [97.1 F (36.2 C)-98.7 F (37.1 C)] 98.2 F (36.8 C) (09/20 1100) Pulse Rate:  [41-118] 52 (09/20 0800) Cardiac Rhythm:  [-] Ventricular paced (09/20 0800) Resp:  [12-30] 20 (09/20 0800) BP: (118-149)/(50-76) 142/67 mmHg (09/20 0800) SpO2:  [99 %-100 %] 100 % (09/20 0800) Arterial Line BP: (132-173)/(51-69) 173/69 mmHg (09/20 0329) Weight:  [190 lb 7.6 oz (86.4 kg)-198 lb 10.2 oz (90.1 kg)] 190 lb 7.6 oz (86.4 kg) (09/20 0329)  CBC:  Recent Labs Lab 10/20/14 0903  10/22/14 0617 10/23/14 0536 10/24/14 0350  WBC 2.7*  < > 2.9* 3.5* 5.3  NEUTROABS 1.0*  --  0.9*  --   --   HGB 13.3  < > 12.6* 13.7 12.9*  HCT 38.7*  < > 37.1* 40.4 38.3*  MCV 84.1  < > 85.3 84.7 85.1  PLT 124*  < > 119* 121* 124*  < > = values in this interval not displayed.  Basic Metabolic Panel:   Recent Labs Lab 10/23/14 0536 10/24/14 0350  NA 133* 133*  K 3.6 4.5  CL 102 105  CO2 23 22  GLUCOSE 95 113*  BUN 17 13  CREATININE 1.24 1.28*  CALCIUM 8.8* 8.2*    Lipid Panel:     Component Value Date/Time   CHOL 159 10/21/2014 0713    TRIG 55 10/21/2014 0713   HDL 43 10/21/2014 0713   CHOLHDL 3.7 10/21/2014 0713   VLDL 11 10/21/2014 0713   LDLCALC 105* 10/21/2014 0713   HgbA1c:  Lab Results  Component Value Date   HGBA1C 5.9* 10/21/2014   Urine Drug Screen: No results found for: LABOPIA, COCAINSCRNUR, LABBENZ, AMPHETMU, THCU, LABBARB    IMAGING  Ct Angio Head and Neck W/cm &/or Wo Cm 10/20/2014    No CT or CTA evidence of acute intracranial infarction.  70% stenosis of the proximal right internal carotid artery.  80% stenosis of the proximal left internal carotid artery.  Atherosclerotic disease in the carotid siphon regions with maximal stenosis estimated at 30-40% on both sides.     Dg Chest 2 View 10/20/2014    1. Stable cardiomegaly.  No CHF.  Cardiac pacer stable position.  2. Low lung volumes with mild bibasilar subsegmental atelectasis.      Ct Head Wo Contrast 10/20/2014    Moderate diffuse cortical atrophy. Minimal chronic ischemic white matter disease. No acute intracranial abnormality seen.   2D echo - - Left ventricle: Hypokinesis of the anterior wall and the anterior septum. The cavity size was normal. Wall thickness was increased  in a pattern of mild LVH. The estimated ejection fraction was 40%. - Mitral valve: There was moderate regurgitation. - Left atrium: The atrium was severely dilated. - Right ventricle: The cavity size was mildly dilated. Systolic function was mildly reduced. - Right atrium: The atrium was mildly dilated. - Tricuspid valve: There was moderate regurgitation. - Pulmonary arteries: PA peak pressure: 35 mm Hg (S). - Impressions: No cardiac source of embolism was identified, but cannot be ruled out on the basis of this examination. Impressions: - No cardiac source of embolism was identified, but cannot be ruled out on the basis of this examination.  EEG - Normal   PHYSICAL EXAM Temp:  [97.1 F (36.2 C)-98.7 F (37.1 C)] 98.2 F (36.8 C) (09/20  1100) Pulse Rate:  [41-118] 52 (09/20 0800) Resp:  [12-30] 20 (09/20 0800) BP: (118-149)/(50-76) 142/67 mmHg (09/20 0800) SpO2:  [99 %-100 %] 100 % (09/20 0800) Arterial Line BP: (132-173)/(51-69) 173/69 mmHg (09/20 0329) Weight:  [190 lb 7.6 oz (86.4 kg)-198 lb 10.2 oz (90.1 kg)] 190 lb 7.6 oz (86.4 kg) (09/20 0329)  General - Well nourished, well developed, in no apparent distress, sitting in chair.  Ophthalmologic - Fundi not visualized due to eye movement.  Cardiovascular - irregularly heart rate and rhythm with   afib rhythm   Mental Status -  Level of arousal and orientation to time, place, and person were intact. Language including expression, naming, repetition, comprehension was assessed and found intact. Fund of Knowledge was assessed and was impaired.  Cranial Nerves II - XII - II - Visual field intact OU. III, IV, VI - Extraocular movements intact. V - Facial sensation intact bilaterally. VII - Facial movement intact bilaterally. VIII - Hearing & vestibular intact bilaterally. X - Palate elevates symmetrically. XI - Chin turning & shoulder shrug intact bilaterally. XII - Tongue protrusion intact.  Motor Strength - The patient's strength was normal in all extremities and pronator drift was absent.  Bulk was normal and fasciculations were absent.   Motor Tone - Muscle tone was assessed at the neck and appendages and was normal.  Reflexes - The patient's reflexes were 1+ in all extremities    Sensory - Light touch, temperature/pinprick were assessed and were symmetrical.    Coordination - The patient had normal movements in the hands and feet with no ataxia or dysmetria.  Tremor was absent.  Gait and Station - deferred to PT who was in room.  ASSESSMENT/PLAN Oscar Santiago is a 81 y.o. male with history of HTN, HLD, OSA on CPAP, Afib on ASA, heart block on pacemaker, partial seizure on keppra  presenting with left arm and leg weakness. He did not receive IV t-PA  due to rapid improving. Symptoms not resolved.    TIA vs. Stroke - right brain symptoms, could be due to afib not on AC vs. Large vessel athero due to bilateral ICA high grade stenosis.  Resultant  Resolution of deficit  CT repeat on acute abnormalities  CTA head and neck - right ICA 70% and left ICA 80% stenosis. Calcification at b/l supraclinoid ICAs  2D Echo  EF 40%, severe dilated LA.   LDL 105  HgbA1c pending  lovenox for VTE prophylaxis Diet clear liquid Room service appropriate?: Yes; Fluid consistency:: Thin  aspirin 81 mg orally every day prior to admission, now on aspirin 325 mg orally every day. Discussed with pt and daughter, they are OK with eliquis  bid for stroke prevention due to afib.  However, would like to consider after vascular surgery evaluation.  Patient counseled to be compliant with his antithrombotic medications  Ongoing aggressive stroke risk factor management  Therapy recommendations:  pending  Disposition:  Pending  Carotid artery high grade stenosis  Right ICA 70% and left ICA 80% stenosis    vascular surgery  plan right CEA 10/23/14  Hold of anticoagulation for now after VVS eval.  From stroke standpoint, pt is ok to have CEA within two weeks.  Afib not on AC  Hx of Afib on coumadin but stopped in 1991 due to brief GIB  Pt and family is OK with NOACs especially eliquis   Recommend eliquis  bid for stroke prevention due to afib. However, would like to consider after vascular surgery evaluation.         Condition Points   C CHF (or Left ventricular systolic dysfunction) 1   H HTN: BP consistently above 140/90 mmHg (or treated HTN on meds)  1   A2 Age ?75 years 2   D DM 0   S2 Prior Stroke or TIA or thromboembolism 2   V Vascular disease (e.g. PAD, MI, aortic plaque) 0   A Age 18-74 years 0   [place] male sex  0           Annual Stroke Risk CHA2DS2-VASc Score Stroke Risk % 95% CI  0 0  1 1.3  2 2.2  3 3.2  4 4.0  5 6.7  6  9.8  7 9.6  8 12.5  9 15.2      Hypertension  Stable Permissive hypertension (OK if < 220/120) but gradually normalize in 5-7 days Avoid hypotension due to carotid stenosis   Hyperlipidemia  Home meds: none  LDL 105, goal < 70  Add pravastatin   Continue statin at discharge  Hx of seizure, partial  Follows up with neurology in [facility]  On keppra 250/500  Was recommended to add vimpat by Dr. Thad Ranger in 09/2014 but family does not add new AED due to concerns that pt would be very drowsy and not able to function by family members.  Ok to continue keppra 250/500 for now  Follow up with neurology in [facility]  EEG normal, last EEG in 06/2014 in [facility] was normal  Other Stroke Risk Factors  Advanced age  Obstructive sleep apnea, on CPAP at home  Other Active Problems  CHB on pacemaker  Hospital day # 1 I recommend long-term anticoagulation with eliquis.Continue keppra for seizures Patient is also likely going to need elective endarterectomy on the left side as well in 6-8 weeks. I had a long discussion with the patient and family at the bedside and answered questions. Stroke team will sign off. Kindly call for questions. Follow-up as an outpatient in stroke clinic in 2 months  Delia Heady, MD Medical Director [facility] Pager: [phone] 10/24/2014 1:35 PM      To contact Stroke Continuity provider, please refer to [email or web address]. After hours, contact General Neurology

## 2014-10-24 NOTE — Progress Notes (Signed)
UR COMPLETED  

## 2014-10-24 NOTE — Anesthesia Postprocedure Evaluation (Signed)
  Anesthesia Post-op Note  Patient: Oscar Santiago  Procedure(s) Performed: Procedure(s): ENDARTERECTOMY CAROTID (Right)  Patient Location: PACU  Anesthesia Type:General  Level of Consciousness: awake  Airway and Oxygen Therapy: Patient Spontanous Breathing and Patient connected to nasal cannula oxygen  Post-op Pain: mild  Post-op Assessment: Post-op Vital signs reviewed, Patient's Cardiovascular Status Stable, Respiratory Function Stable, Patent Airway, No signs of Nausea or vomiting and Pain level controlled LLE Motor Response: Purposeful movement, Responds to commands LLE Sensation: No numbness RLE Motor Response: Purposeful movement, Responds to commands RLE Sensation: No numbness      Post-op Vital Signs: Reviewed and stable  Last Vitals:  Filed Vitals:   10/24/14 1115  BP: 128/79  Pulse:   Temp:   Resp: 13    Complications: No apparent anesthesia complications

## 2014-10-24 NOTE — Progress Notes (Signed)
Physical Therapy Treatment Patient Details Name: Oscar Santiago MRN: [id number] DOB: [birth year] Today's Date: 10/24/2014    History of Present Illness Oscar Santiago is an 81 y.o. male presenting to the hospital. Patient awoke at 0700 hours and felt normal. He went to put on his shirt and noted his left arm was week and then fell due to his left leg giving out. EMS was called to [facility] for a fall but on arrival noted he had left sided plegia and facial droop. He was also noted to have dysarthria. On arrival to ED his dysarthria resolved but continued to have left arm weakness but NOT plegia. He only had a left arm drift. S/P L CEA    PT Comments    Progressing well.  Improved stability.  Limited by inability to urinate and pain involved.  Follow Up Recommendations  Home health PT;Supervision/Assistance - 24 hour     Equipment Recommendations  Rolling walker with 5" wheels    Recommendations for Other Services       Precautions / Restrictions Precautions Precautions: Fall Restrictions Weight Bearing Restrictions: No    Mobility  Bed Mobility Overal bed mobility: Independent                Transfers Overall transfer level: Needs assistance Equipment used: Rolling walker (2 wheeled) Transfers: Sit to/from Stand Sit to Stand: Supervision         General transfer comment: guard from lower toilet, otherwise supervision  Ambulation/Gait Ambulation/Gait assistance: Supervision Ambulation Distance (Feet): 250 Feet Assistive device: Rolling walker (2 wheeled);None Gait Pattern/deviations: Step-through pattern;Decreased stride length;Trunk flexed Gait velocity: slower   General Gait Details: Due to discomfort of not being able to urinate, pt used RW.  He was steady through out even in confined spaces.   Stairs            Wheelchair Mobility    Modified Rankin (Stroke Patients Only) Modified Rankin (Stroke Patients Only) Pre-Morbid Rankin Score: Slight  disability Modified Rankin: Moderate disability     Balance Overall balance assessment: Needs assistance   Sitting balance-Leahy Scale: Good       Standing balance-Leahy Scale: Fair Standing balance comment: prefers UE support, but can stand without assist, but no challenge.                    Cognition Arousal/Alertness: Awake/alert Behavior During Therapy: WFL for tasks assessed/performed Overall Cognitive Status: Within Functional Limits for tasks assessed                      Exercises      General Comments        Pertinent Vitals/Pain Pain Assessment: Faces Faces Pain Scale: Hurts whole lot Pain Location: bladder, urinary Pain Descriptors / Indicators: Burning;Discomfort Pain Intervention(s): Limited activity within patient's tolerance    Home Living                      Prior Function            PT Goals (current goals can now be found in the care plan section) Acute Rehab PT Goals Patient Stated Goal: to gohome PT Goal Formulation: With patient Time For Goal Achievement: 11/04/14 Progress towards PT goals: Progressing toward goals    Frequency  Min 3X/week    PT Plan Current plan remains appropriate    Co-evaluation             End of Session  Activity Tolerance: Patient limited by fatigue Patient left: in chair;with call bell/phone within reach     Time: 1212-1229 PT Time Calculation (min) (ACUTE ONLY): 17 min  Charges:  $Gait Training: 8-22 mins                    G Codes:      Mottinger, Eliseo Gum 10/24/2014, 12:46 PM 10/24/2014  [place], PT [phone]  (pager)

## 2014-10-24 NOTE — Progress Notes (Signed)
Occupational Therapy Treatment Patient Details Name: Oscar Santiago MRN: [id number] DOB: [birth year] Today's Date: 10/24/2014    History of present illness Oscar Santiago is an 81 y.o. male presenting to the hospital. Patient awoke at 0700 hours and felt normal. He went to put on his shirt and noted his left arm was week and then fell due to his left leg giving out. EMS was called to [facility] for a fall but on arrival noted he had left sided plegia and facial droop. He was also noted to have dysarthria. On arrival to ED his dysarthria resolved but continued to have left arm weakness but NOT plegia. He only had a left arm drift. S/P L CEA   OT comments  [name] admitted with above presents to acute OT trying to do his best a present; however bladder issues are not making it comfortable for being up and about. Will continue to follow.  Follow Up Recommendations  Supervision/Assistance - 24 hour;Home health OT    Equipment Recommendations  Tub/shower bench;Other (comment) (family will get on their own)       Precautions / Restrictions Precautions Precautions: Fall Restrictions Weight Bearing Restrictions: No       Mobility Bed Mobility Overal bed mobility: Independent                Transfers Overall transfer level: Needs assistance Equipment used: Rolling walker (2 wheeled) Transfers: Sit to/from Stand Sit to Stand: Supervision         General transfer comment: guard from lower toilet, otherwise supervision    Balance Overall balance assessment: Needs assistance   Sitting balance-Leahy Scale: Good       Standing balance-Leahy Scale: Fair Standing balance comment: prefers UE support, but can stand without assist, but no challenge.                   ADL Overall ADL's : Needs assistance/impaired     Grooming: Oral care;Supervision/safety;Set up;Standing Grooming Details (indicate cue type and reason): able to manipulate toothpaste and toothbrush  with LUE without difficutly                                                Cognition   Behavior During Therapy: WFL for tasks assessed/performed Overall Cognitive Status: Within Functional Limits for tasks assessed                                    Pertinent Vitals/ Pain       Pain Assessment: 0-10 Pain Score: 2  Faces Pain Scale: Hurts whole lot Pain Location: bladder Pain Descriptors / Indicators: Pressure Pain Intervention(s): Monitored during session;Limited activity within patient's tolerance         Frequency Min 2X/week     Progress Toward Goals  OT Goals(current goals can now be found in the care plan section)  Progress towards OT goals:  (same as on eval, feel like once bladder issue resolved and pt without multiple lines then he will not be too far from achieving Mod I)  Acute Rehab OT Goals Patient Stated Goal: to [facility]  Plan Discharge plan remains appropriate       End of Session Equipment Utilized During Treatment: Rolling walker   Activity Tolerance Patient tolerated treatment well (  despite the pressure he was feeling in his bladder)   Patient Left in bed;with call bell/phone within reach;with family/visitor present (with RN getting ready to try and place a foley)   Nurse Communication          Time: 1031-1050 OT Time Calculation (min): 19 min  Charges: OT General Charges $OT Visit: 1 Procedure OT Treatments $Self Care/Home Management : 8-22 mins  Evette Georges [phone] 10/24/2014, 1:15 PM

## 2014-10-24 NOTE — Progress Notes (Signed)
Subjective: Interval History: none.. Sitting up in a chair. Pain well controlled with oral pain medication. Has been unable to void. Bladder scan showed over 500 cc. To unsuccessful attempts at in and out catheter and the nurses about attempt placing a Foley  Objective: Vital signs in last 24 hours: Temp:  [97 F (36.1 C)-98.7 F (37.1 C)] 98.3 F (36.8 C) (09/20 0329) Pulse Rate:  [41-118] 56 (09/20 0329) Resp:  [12-30] 12 (09/20 0329) BP: (118-170)/(50-78) 143/67 mmHg (09/20 0329) SpO2:  [99 %-100 %] 100 % (09/20 0329) Arterial Line BP: (132-173)/(51-83) 173/69 mmHg (09/20 0329) Weight:  [190 lb 7.6 oz (86.4 kg)-198 lb 10.2 oz (90.1 kg)] 190 lb 7.6 oz (86.4 kg) (09/20 0329)  Intake/Output from previous day: 09/19 0701 - 09/20 0700 In: 855.8 [P.O.:120; I.V.:685.8; IV Piggyback:50] Out: 50 [Blood:50] Intake/Output this shift:    Neck incision healing quite nicely. No hematoma. Dressing removed and Steri-Strips are intact. Neurologically intact  Lab Results:  Recent Labs  10/23/14 0536 10/24/14 0350  WBC 3.5* 5.3  HGB 13.7 12.9*  HCT 40.4 38.3*  PLT 121* 124*   BMET  Recent Labs  10/23/14 0536 10/24/14 0350  NA 133* 133*  K 3.6 4.5  CL 102 105  CO2 23 22  GLUCOSE 95 113*  BUN 17 13  CREATININE 1.24 1.28*  CALCIUM 8.8* 8.2*    Studies/Results: Ct Angio Head W/cm &/or Wo Cm  10/20/2014   CLINICAL DATA:  Gait disturbance. Slurred speech. Seizures. Atrial fibrillation. Symptoms began today.  EXAM: CT ANGIOGRAPHY HEAD AND NECK  TECHNIQUE: Multidetector CT imaging of the head and neck was performed using the standard protocol during bolus administration of intravenous contrast. Multiplanar CT image reconstructions and MIPs were obtained to evaluate the vascular anatomy. Carotid stenosis measurements (when applicable) are obtained utilizing NASCET criteria, using the distal internal carotid diameter as the denominator.  CONTRAST:  50mL OMNIPAQUE IOHEXOL 350 MG/ML SOLN   COMPARISON:  CT earlier same day.  FINDINGS: CT HEAD  Brain shows generalized atrophy with mild chronic small-vessel changes of the white matter. No sign of acute infarction, mass lesion, hemorrhage, hydrocephalus or extra-axial collection.  CTA NECK  Aortic arch: Mild atherosclerosis of the arch without aneurysm or dissection. Branching pattern of the brachiocephalic vessels from the arch is normal without origin stenosis.  Right carotid system: Common carotid artery widely patent to the bifurcation. There is advanced atherosclerotic disease affecting the proximal internal carotid artery. Minimal diameter in the bulb is 1.5 mm. Compared to a more distal cervical ICA diameter of 5 mm, this indicates 70% stenosis.  Left carotid system: Common carotid artery widely patent to the bifurcation region. Complex atherosclerotic disease of the ICA bulb with minimal diameter of 1 mm. Compared to a more distal cervical ICA diameter of 5 mm, this indicates an 80% stenosis.  Vertebral arteries:The left vertebral artery is dominant. There is 20% stenosis at the origin. The vessel is widely patent beyond that. The nondominant right vertebral artery is widely patent at its origin and through the cervical region.  Skeleton: Ordinary spondylosis  Other neck: No significant soft tissue lesion no upper chest lesion.  CTA HEAD  Anterior circulation: Internal carotid arteries are widely patent through the skullbase. There is atherosclerotic calcification in the siphon regions but no stenosis greater than 30-40%. Supra clinoid internal carotid arteries are widely patent. The anterior and middle cerebral vessels are patent without proximal stenosis, aneurysm or vascular malformation. No missing branches are appreciated.  Posterior circulation:  Both vertebral arteries are widely patent through the foramen magnum to the basilar. No basilar stenosis. Posterior circulation branch vessels are patent.  Venous sinuses: Patent and normal  Anatomic  variants: None significant  IMPRESSION: No CT or CTA evidence of acute intracranial infarction.  70% stenosis of the proximal right internal carotid artery.  80% stenosis of the proximal left internal carotid artery.  Atherosclerotic disease in the carotid siphon regions with maximal stenosis estimated at 30-40% on both sides.   Electronically Signed   By: Paulina Fusi M.D.   On: 10/20/2014 20:32   Dg Chest 2 View  10/20/2014   CLINICAL DATA:  Atrial fibrillation.  TIA.  EXAM: CHEST  2 VIEW  COMPARISON:  07/25/2014.  FINDINGS: Cardiac pacer with lead tip projected over right ventricle. Cardiomegaly with normal pulmonary vascularity. Low lung volumes with mild bibasilar atelectasis. No pleural effusion or pneumothorax. Diffuse degenerative change and osteopenia thoracic spine with stable compression fractures.  IMPRESSION: 1. Stable cardiomegaly.  No CHF.  Cardiac pacer stable position. 2. Low lung volumes with mild bibasilar subsegmental atelectasis.   Electronically Signed   By: Maisie Fus  Register   On: 10/20/2014 14:52   Ct Head Wo Contrast  10/20/2014   CLINICAL DATA:  Confusion, slurred speech.  EXAM: CT HEAD WITHOUT CONTRAST  TECHNIQUE: Contiguous axial images were obtained from the base of the skull through the vertex without intravenous contrast.  COMPARISON:  CT scan of September 20, 2014.  FINDINGS: Bony calvarium appears intact. Moderate diffuse cortical atrophy is noted. Diffuse ventricular dilatation is noted consistent with the degree of atrophy. Minimal chronic ischemic white matter disease is noted. No mass effect or midline shift is noted. There is no evidence of mass lesion, hemorrhage or acute infarction.  IMPRESSION: Moderate diffuse cortical atrophy. Minimal chronic ischemic white matter disease. No acute intracranial abnormality seen. These results were called by telephone at the time of interpretation on 10/20/2014 at 9:24 am to Dr. Roseanne Reno, who verbally acknowledged these results.    Electronically Signed   By: Lupita Raider, M.D.   On: 10/20/2014 09:25   Ct Angio Neck W/cm &/or Wo/cm  10/20/2014   CLINICAL DATA:  Gait disturbance. Slurred speech. Seizures. Atrial fibrillation. Symptoms began today.  EXAM: CT ANGIOGRAPHY HEAD AND NECK  TECHNIQUE: Multidetector CT imaging of the head and neck was performed using the standard protocol during bolus administration of intravenous contrast. Multiplanar CT image reconstructions and MIPs were obtained to evaluate the vascular anatomy. Carotid stenosis measurements (when applicable) are obtained utilizing NASCET criteria, using the distal internal carotid diameter as the denominator.  CONTRAST:  50mL OMNIPAQUE IOHEXOL 350 MG/ML SOLN  COMPARISON:  CT earlier same day.  FINDINGS: CT HEAD  Brain shows generalized atrophy with mild chronic small-vessel changes of the white matter. No sign of acute infarction, mass lesion, hemorrhage, hydrocephalus or extra-axial collection.  CTA NECK  Aortic arch: Mild atherosclerosis of the arch without aneurysm or dissection. Branching pattern of the brachiocephalic vessels from the arch is normal without origin stenosis.  Right carotid system: Common carotid artery widely patent to the bifurcation. There is advanced atherosclerotic disease affecting the proximal internal carotid artery. Minimal diameter in the bulb is 1.5 mm. Compared to a more distal cervical ICA diameter of 5 mm, this indicates 70% stenosis.  Left carotid system: Common carotid artery widely patent to the bifurcation region. Complex atherosclerotic disease of the ICA bulb with minimal diameter of 1 mm. Compared to a more distal cervical ICA  diameter of 5 mm, this indicates an 80% stenosis.  Vertebral arteries:The left vertebral artery is dominant. There is 20% stenosis at the origin. The vessel is widely patent beyond that. The nondominant right vertebral artery is widely patent at its origin and through the cervical region.  Skeleton: Ordinary  spondylosis  Other neck: No significant soft tissue lesion no upper chest lesion.  CTA HEAD  Anterior circulation: Internal carotid arteries are widely patent through the skullbase. There is atherosclerotic calcification in the siphon regions but no stenosis greater than 30-40%. Supra clinoid internal carotid arteries are widely patent. The anterior and middle cerebral vessels are patent without proximal stenosis, aneurysm or vascular malformation. No missing branches are appreciated.  Posterior circulation: Both vertebral arteries are widely patent through the foramen magnum to the basilar. No basilar stenosis. Posterior circulation branch vessels are patent.  Venous sinuses: Patent and normal  Anatomic variants: None significant  IMPRESSION: No CT or CTA evidence of acute intracranial infarction.  70% stenosis of the proximal right internal carotid artery.  80% stenosis of the proximal left internal carotid artery.  Atherosclerotic disease in the carotid siphon regions with maximal stenosis estimated at 30-40% on both sides.   Electronically Signed   By: Paulina Fusi M.D.   On: 10/20/2014 20:32   Anti-infectives: Anti-infectives    Start     Dose/Rate Route Frequency Ordered Stop   10/23/14 2000  cefUROXime (ZINACEF) 1.5 g in dextrose 5 % 50 mL IVPB     1.5 g 100 mL/hr over 30 Minutes Intravenous Every 12 hours 10/23/14 1759 10/24/14 1959   10/23/14 0800  ceFAZolin (ANCEF) IVPB 1 g/50 mL premix    Comments:  Send with pt to OR   1 g 100 mL/hr over 30 Minutes Intravenous To [facility] 10/22/14 1614 10/23/14 1115      Assessment/Plan: s/p Procedure(s): ENDARTERECTOMY CAROTID (Right) Stable postop day 1 was no neurologic deficit. Has had prostate surgery in the past and this has an appointment in the near future with Dr.Nesi with [facility].  Should be stable for transfer to the floor today. May need urology consultation.   LOS: 2 days   Early, Todd 10/24/2014, 7:12 AM

## 2014-10-24 NOTE — Consult Note (Signed)
Urology Consult   Physician requesting consult: Early  Reason for consult: Post op urinary Retention  History of Present Illness: Oscar Santiago is a 81 y.o. male with PMH significant for HTN, seizure disorder, OSA on CPAP, CHF, CKD III, complete heart block, and afib not on anticoagulation.  He is s/p TIA 10/20/14 and right CEA 10/23/14.  After surgery he was unable to void and bladder scan revealed 500cc.  RN attempted unsuccessful foley placement x 2 and then coude team attempted placement x 2 which was also unsuccessful.  Pt complains of lower abdominal discomfort and distention.  He states that prior to surgery he was having urinary hesitancy, intermittent stream, and feelings of incomplete emptying on occasion.  He denies frequency, urgency, hematuria, and incontinence.    Pt has been followed in our office by Dr. Brunilda Payor and was last seen in Jan 2016 with cystoscopy.  He was being evaled for a bladder mass noted on CT scan.  This proved to be the lateral lobes of the prostate and not a bladder tumor.  He is s/p TURP in 1999.    Currently his only complaint is of bladder pain.  He denies F/C, HA, dizziness, difficulty with speech, CP, and SOB.  Past Medical History  Diagnosis Date  . Hypertension   . Seizure disorder   . Glaucoma   . Hyperlipidemia   . Osteopenia   . Compression fracture of spine     T 10 and T11  . Gout   . OSA (obstructive sleep apnea)     on CPAP therapy  . Hypothyroid   . Inguinal hernia   . Hemorrhoid   . Chronic systolic dysfunction of left ventricle     EF 30-35% by echo 10/10/2009  . Major depression   . History of GI bleed     from coumadin  . MR (mitral regurgitation)   . Hearing loss   . Dizziness     intermittent  . Kidney stone   . CKD (chronic kidney disease), stage III   . Permanent atrial fibrillation     refuses coumadin due to history of GI bleed  . Decreased appetite 03/02/12  . Weight loss 03/02/12  . Complete heart block   . CHF  (congestive heart failure)     Past Surgical History  Procedure Laterality Date  . Transurethral resection of prostate    . Cardiac catheterization  2002  . Pacemaker insertion  07/28/06    VVI pacemaker for complete heart block by Dr Amil Amen  . Inguinal hernia repair    . Hemorrhoid surgery    . Colonoscopy with propofol N/A 05/01/2014    Procedure: COLONOSCOPY WITH PROPOFOL;  Surgeon: [name], MD;  Location: [facility];  Service: Endoscopy;  Laterality: N/A;  . Endarterectomy Right 10/23/2014    Procedure: ENDARTERECTOMY CAROTID;  Surgeon: Larina Earthly, MD;  Location: [facility];  Service: Vascular;  Laterality: Right;    Current Hospital Medications:  Home Meds:    Medication List    ASK your doctor about these medications        acetaminophen 500 MG tablet  Commonly known as:  TYLENOL  Take 500 mg by mouth every 6 (six) hours as needed (pain).     alendronate 70 MG tablet  Commonly known as:  FOSAMAX  Take 70 mg by mouth every Monday.     amLODipine 10 MG tablet  Commonly known as:  NORVASC  Take 5 mg by mouth daily  as needed (diastolic blood pressure over 90).     aspirin EC 81 MG tablet  Take 81 mg by mouth daily.     BLACK CHERRY CONCENTRATE PO  Take 1 capsule by mouth daily.     cetirizine 10 MG tablet  Commonly known as:  ZYRTEC  Take 10 mg by mouth daily.     colchicine 0.6 MG tablet  TAKE 1 TABLET TWICE DAILY     CORICIDIN HBP CONGESTION/COUGH 10-200 MG Caps  Generic drug:  Dextromethorphan-Guaifenesin  Take 1 capsule by mouth daily as needed (for cold).     dorzolamide-timolol 22.3-6.8 MG/ML ophthalmic solution  Commonly known as:  COSOPT  Place 1 drop into both eyes 2 (two) times daily.     famotidine 10 MG chewable tablet  Commonly known as:  PEPCID AC  Chew 10 mg by mouth daily as needed for heartburn.     FLONASE 50 MCG/ACT nasal spray  Generic drug:  fluticasone  Place 2 sprays into both nostrils daily as needed for allergies or  rhinitis. \     furosemide 20 MG tablet  Commonly known as:  LASIX  Take 1 tablet (20 mg total) by mouth daily.     lacosamide 50 MG Tabs tablet  Commonly known as:  VIMPAT  Take 1 tablet (50 mg total) by mouth 2 (two) times daily.     latanoprost 0.005 % ophthalmic solution  Commonly known as:  XALATAN  Place 1 drop into both eyes at bedtime.     levETIRAcetam 500 MG tablet  Commonly known as:  KEPPRA  Take 250-500 mg by mouth 2 (two) times daily. Take 1/2 tablet (250 mg) by mouth every morning and 1 tablet (500 mg) every night     levothyroxine 25 MCG tablet  Commonly known as:  SYNTHROID, LEVOTHROID  TAKE 1 TABLET (25 MCG TOTAL) BY MOUTH DAILY BEFORE BREAKFAST.     multivitamin with minerals Tabs tablet  Take 1 tablet by mouth daily.     valsartan 320 MG tablet  Commonly known as:  DIOVAN  Take 1 tablet (320 mg total) by mouth daily with lunch.     vitamin B-12 1000 MCG tablet  Commonly known as:  CYANOCOBALAMIN  Take 1,000 mcg by mouth daily.     zolpidem 5 MG tablet  Commonly known as:  AMBIEN  TAKE 1 TABLET AT BEDTIME        Scheduled Meds: . aspirin  300 mg Rectal Daily   Or  . aspirin  325 mg Oral Daily  . cefUROXime (ZINACEF)  IV  1.5 g Intravenous Q12H  . docusate sodium  100 mg Oral Daily  . dorzolamide-timolol  1 drop Both Eyes BID  . enoxaparin (LOVENOX) injection  40 mg Subcutaneous Daily  . furosemide  20 mg Oral Daily  . [START ON 10/25/2014] Influenza vac split quadrivalent PF  0.5 mL Intramuscular Tomorrow-1000  . irbesartan  37.5 mg Oral Daily  . latanoprost  1 drop Both Eyes QHS  . levETIRAcetam  250 mg Oral q morning - 10a  . levETIRAcetam  500 mg Oral QHS  . levothyroxine  25 mcg Oral QAC breakfast  . lidocaine  1 application Urethral Once  . lidocaine  1 application Topical Once  . loratadine  10 mg Oral Daily  . metoprolol succinate  25 mg Oral Daily  . multivitamin with minerals  1 tablet Oral Daily  . pantoprazole  40 mg Oral Daily   . pravastatin  20  mg Oral q1800  . tamsulosin  0.4 mg Oral QPC breakfast  . zolpidem  2.5 mg Oral QHS   Continuous Infusions: . lactated ringers 10 mL/hr at 10/23/14 0937   PRN Meds:.acetaminophen **OR** acetaminophen, alum & mag hydroxide-simeth, amLODipine, fluticasone, guaiFENesin-dextromethorphan, hydrALAZINE, labetalol, magnesium sulfate 1 - 4 g bolus IVPB, metoprolol, morphine injection, ondansetron, oxyCODONE-acetaminophen, phenol, potassium chloride, senna-docusate  Allergies:  Allergies  Allergen Reactions  . Coumadin [Warfarin Sodium] Other (See Comments)    GI Bleed   . Lisinopril Cough    Family History  Problem Relation Age of Onset  . Heart failure Mother   . CVA Sister   . Aneurysm Brother     brain  . Colon cancer Neg Hx   . Colon polyps Neg Hx     Social History:  reports that he has never smoked. He has never used smokeless tobacco. He reports that he does not drink alcohol or use illicit drugs.  ROS: A complete review of systems was performed.  All systems are negative except for pertinent findings as noted.  Physical Exam:  Vital signs in last 24 hours: Temp:  [97.1 F (36.2 C)-98.7 F (37.1 C)] 98.2 F (36.8 C) (09/20 1100) Pulse Rate:  [41-118] 52 (09/20 0800) Resp:  [12-30] 20 (09/20 0800) BP: (118-149)/(50-76) 142/67 mmHg (09/20 0800) SpO2:  [99 %-100 %] 100 % (09/20 0800) Arterial Line BP: (132-173)/(51-69) 173/69 mmHg (09/20 0329) Weight:  [86.4 kg (190 lb 7.6 oz)-90.1 kg (198 lb 10.2 oz)] 86.4 kg (190 lb 7.6 oz) (09/20 0329) Constitutional:  Alert and oriented, No acute distress Cardiovascular: Regular rate and rhythm Respiratory: Normal respiratory effort GI: Abdomen is soft, nontender, suprapubic area distended and tender GU: uncirc penis with no discharge or skin breakdown; normal meatus; foreskin easily retracted and replaced. Lymphatic: No lymphadenopathy Neurologic: Grossly intact, no focal deficits Psychiatric: Normal mood and  affect  Laboratory Data:   Recent Labs  10/22/14 0617 10/23/14 0536 10/24/14 0350  WBC 2.9* 3.5* 5.3  HGB 12.6* 13.7 12.9*  HCT 37.1* 40.4 38.3*  PLT 119* 121* 124*     Recent Labs  10/23/14 0536 10/24/14 0350  NA 133* 133*  K 3.6 4.5  CL 102 105  GLUCOSE 95 113*  BUN 17 13  CALCIUM 8.8* 8.2*  CREATININE 1.24 1.28*     Results for orders placed or performed during the hospital encounter of 10/20/14 (from the past 24 hour(s))  CBC     Status: Abnormal   Collection Time: 10/24/14  3:50 AM  Result Value Ref Range   WBC 5.3 4.0 - 10.5 K/uL   RBC 4.50 4.22 - 5.81 MIL/uL   Hemoglobin 12.9 (L) 13.0 - 17.0 g/dL   HCT 78.2 (L) 95.6 - 21.3 %   MCV 85.1 78.0 - 100.0 fL   MCH 28.7 26.0 - 34.0 pg   MCHC 33.7 30.0 - 36.0 g/dL   RDW 08.6 (H) 57.8 - 46.9 %   Platelets 124 (L) 150 - 400 K/uL  Basic metabolic panel     Status: Abnormal   Collection Time: 10/24/14  3:50 AM  Result Value Ref Range   Sodium 133 (L) 135 - 145 mmol/L   Potassium 4.5 3.5 - 5.1 mmol/L   Chloride 105 101 - 111 mmol/L   CO2 22 22 - 32 mmol/L   Glucose, Bld 113 (H) 65 - 99 mg/dL   BUN 13 6 - 20 mg/dL   Creatinine, Ser 6.29 (H) 0.61 - 1.24 mg/dL  Calcium 8.2 (L) 8.9 - 10.3 mg/dL   GFR calc non Af Amer 49 (L) >60 mL/min   GFR calc Af Amer 56 (L) >60 mL/min   Anion gap 6 5 - 15   Recent Results (from the past 240 hour(s))  Surgical pcr screen     Status: None   Collection Time: 10/22/14  9:18 PM  Result Value Ref Range Status   MRSA, PCR NEGATIVE NEGATIVE Final   Staphylococcus aureus NEGATIVE NEGATIVE Final    Comment:        The Xpert SA Assay (FDA approved for NASAL specimens in patients over 61 years of age), is one component of a comprehensive surveillance program.  Test performance has been validated by [facility] for patients greater than or equal to 79 year old. It is not intended to diagnose infection nor to guide or monitor treatment.     Renal Function:  Recent Labs   10/20/14 0903 10/20/14 0909 10/21/14 0713 10/23/14 0536 10/24/14 0350  CREATININE 1.33* 1.40* 1.24 1.24 1.28*   Estimated Creatinine Clearance: 44.3 mL/min (by C-G formula based on Cr of 1.28).  Radiologic Imaging: No results found.  Procedure:  Foreskin retracted and area prepped with betadine and draped in sterile fashion.  #18 coude cath placed with moderate resistance.  Immediate return of dark pink urine.  Foley balloon inflated with 10cc sterile saline.  Foreskin replaced to natural position.  Immediate return of 850cc urine. Pt tolerated well.   Impression/Recommendation Post op urinary retention--leave foley in place for 3-5 days.  Flomax started today and will need to continue until after void trial.  Due to pt's preop LUTS he would likely benefit from long term continuation of Flomax.  Void trial can be performed as an inpatient or as an outpatient in our office.  Will check UA for complete eval. Due to obstruction in a pt with CKD his Cr should be checked in several days as well.   DANCY, AMANDA 10/24/2014, 1:06 PM

## 2014-10-25 DIAGNOSIS — I6523 Occlusion and stenosis of bilateral carotid arteries: Secondary | ICD-10-CM | POA: Diagnosis not present

## 2014-10-25 DIAGNOSIS — E039 Hypothyroidism, unspecified: Secondary | ICD-10-CM

## 2014-10-25 LAB — BASIC METABOLIC PANEL
ANION GAP: 7 (ref 5–15)
BUN: 13 mg/dL (ref 6–20)
CALCIUM: 8.5 mg/dL — AB (ref 8.9–10.3)
CO2: 22 mmol/L (ref 22–32)
Chloride: 104 mmol/L (ref 101–111)
Creatinine, Ser: 1.27 mg/dL — ABNORMAL HIGH (ref 0.61–1.24)
GFR calc Af Amer: 57 mL/min — ABNORMAL LOW (ref >60)
GFR, EST NON AFRICAN AMERICAN: 49 mL/min — AB (ref >60)
GLUCOSE: 119 mg/dL — AB (ref 65–99)
POTASSIUM: 4.1 mmol/L (ref 3.5–5.1)
SODIUM: 133 mmol/L — AB (ref 135–145)

## 2014-10-25 LAB — CBC
HCT: 36.1 % — ABNORMAL LOW (ref 39.0–52.0)
HEMOGLOBIN: 12.4 g/dL — AB (ref 13.0–17.0)
MCH: 29.2 pg (ref 26.0–34.0)
MCHC: 34.3 g/dL (ref 30.0–36.0)
MCV: 84.9 fL (ref 78.0–100.0)
Platelets: 113 10*3/uL — ABNORMAL LOW (ref 150–400)
RBC: 4.25 MIL/uL (ref 4.22–5.81)
RDW: 15.7 % — ABNORMAL HIGH (ref 11.5–15.5)
WBC: 5 10*3/uL (ref 4.0–10.5)

## 2014-10-25 MED ORDER — LACOSAMIDE 50 MG PO TABS
50.0000 mg | ORAL_TABLET | Freq: Two times a day (BID) | ORAL | Status: DC
  Administered 2014-10-26 – 2014-10-28 (×6): 50 mg via ORAL
  Filled 2014-10-25 (×6): qty 1

## 2014-10-25 NOTE — Progress Notes (Signed)
Patient ID: Oscar Santiago, male   DOB: 21-Aug-1925, 81 y.o.   MRN: [id number] Comfortable. They will control with pain medication. Had Foley placed yesterday with some bloody urine present in his Foley bag.  Neurologically remains intact. Right neck without hematoma. Patient healing nicely.  Stable from vascular standpoint. Okay to start anticoagulation whenever indicated. Following with you.

## 2014-10-25 NOTE — Significant Event (Signed)
Rapid Response Event Note  Overview: Time Called: 2057 Arrival Time: 2100 Event Type: Neurologic  Initial Focused Assessment: Called per floor RN for pt with possible seizures. Pt found per RN with mild confusion. Per granddaughter at bedside Pt has these spells at home and it is believed to be post seizure activity or partial seizures. Pt oriented to name only, answering other orientation questions with "Im great,  everything is great", Moves all extremities equally, denies pain, follows simple commands, no facial droop or dysarthria assessed.   Intervention: [facility] NP K. Schor and Dr. Cyril Mourning Neuro Hospitalist in house paged to update on Pt status. Pt reassessed at 2115 oriented x4 with no recollection of last 15-20 minutes. Dr. Cyril Mourning to bedside at 2120 to assess Pt. MD to review Pt seizure medications and make new orders as needed. Pt left resting in bed. RN to monitor Pt closely tonight, advised to notify myself and Provider for worsening changes.   Event Summary: Name of Physician Notified: Jerilynn Mages NP [facility] at 2105    at       Dr. Cyril Mourning Neurology paged at  2110     Oscar Santiago, Oscar Santiago

## 2014-10-25 NOTE — Care Management Important Message (Signed)
Important Message  Patient Details  Name: Oscar Santiago MRN: [id number] Date of Birth: Feb 12, 1925   Medicare Important Message Given:  Yes-second notification given    Orson Aloe 10/25/2014, 11:54 AM

## 2014-10-25 NOTE — Progress Notes (Signed)
Pt family called RN into the room. Patient was staring off without responding to voice. Pt eventually answering who he was but saying "Its all right" repeatedly to further questions. Vitals taken and stable. MD paged. Rapid at the bedside. Suction and O2 set up. Pt family member states this is how he acts when he is having a seizure. Monitoring closely. [place], [place] Scrape, RN

## 2014-10-25 NOTE — Progress Notes (Signed)
Physical Therapy Treatment Patient Details Name: Oscar Santiago MRN: [id number] DOB: Feb 01, 1926 Today's Date: 10/25/2014    History of Present Illness Oscar Santiago is an 81 y.o. male presenting to the hospital. Patient awoke at 0700 hours and felt normal. He went to put on his shirt and noted his left arm was week and then fell due to his left leg giving out. EMS was called to [facility] for a fall but on arrival noted he had left sided plegia and facial droop. He was also noted to have dysarthria. On arrival to ED his dysarthria resolved but continued to have left arm weakness but NOT plegia. He only had a left arm drift. S/P L CEA    PT Comments    Patient very sweet and motivated. Required increase time for OOB. Patient stated that he did feel a lot weaker from being in the bed. Recommend daily walks with nursing to build back up activity tolerance. He will have assistance from family at home and he stated that they have already rearranged his house to make it easier for him to move around.   Follow Up Recommendations  Home health PT;Supervision/Assistance - 24 hour     Equipment Recommendations  Rolling walker with 5" wheels    Recommendations for Other Services       Precautions / Restrictions Precautions Precautions: Fall Restrictions Weight Bearing Restrictions: No    Mobility  Bed Mobility Overal bed mobility: Modified Independent             General bed mobility comments: increased time  Transfers Overall transfer level: Needs assistance Equipment used: Rolling walker (2 wheeled)   Sit to Stand: Min guard         General transfer comment: MG as patient with posterior lean and increased effort this session  Ambulation/Gait Ambulation/Gait assistance: Min guard Ambulation Distance (Feet): 250 Feet Assistive device: Rolling walker (2 wheeled) Gait Pattern/deviations: Step-through pattern;Decreased stride length;Trunk flexed Gait velocity: slower;  guarded Gait velocity interpretation: Below normal speed for age/gender General Gait Details: Cues to stand upright and within RW. Patient feels much safer with RW    Stairs            Wheelchair Mobility    Modified Rankin (Stroke Patients Only)       Balance                                    Cognition Arousal/Alertness: Awake/alert Behavior During Therapy: WFL for tasks assessed/performed Overall Cognitive Status: Within Functional Limits for tasks assessed                      Exercises      General Comments        Pertinent Vitals/Pain Pain Score: 4  Pain Location: bladder/cathter sight Pain Descriptors / Indicators: Burning    Home Living                      Prior Function            PT Goals (current goals can now be found in the care plan section) Progress towards PT goals: Progressing toward goals    Frequency  Min 3X/week    PT Plan Current plan remains appropriate    Co-evaluation             End of Session Equipment Utilized During Treatment: Gait  belt Activity Tolerance: Patient tolerated treatment well Patient left: in bed;with call bell/phone within reach     Time: 1005-1029 PT Time Calculation (min) (ACUTE ONLY): 24 min  Charges:  $Gait Training: 8-22 mins $Therapeutic Activity: 8-22 mins                    G Codes:      Fredrich Birks 10/25/2014, 10:33 AM 10/25/2014 Robinette, Adline Potter PTA

## 2014-10-25 NOTE — Evaluation (Signed)
Speech Language Pathology Evaluation Patient Details Name: Oscar Santiago MRN: [id number] DOB: [birth year] Today's Date: 10/25/2014 Time: 1049-1100 SLP Time Calculation (min) (ACUTE ONLY): 11 min  Problem List:  Patient Active Problem List   Diagnosis Date Noted  . Left-sided weakness   . Stroke   . TIA (transient ischemic attack) 10/20/2014  . Hypothyroidism 10/20/2014  . Leukopenia 10/20/2014  . Thrombocytopenia 10/20/2014  . Chronic systolic congestive heart failure, NYHA class 1 10/20/2014  . Preventative health care 04/12/2014  . Loss of weight 02/21/2014  . CKD (chronic kidney disease), stage III 12/19/2013  . Seizure disorder 12/19/2013  . Complete heart block 03/13/2013  . Chronic systolic dysfunction of left ventricle 03/13/2013  . Cardiac pacemaker in situ 01/05/2013  . Chronic atrial fibrillation (CHADVASc = 5)   . Hypertension   . Hyperlipidemia   . Osteopenia   . Kidney stone   . OSA (obstructive sleep apnea)    Past Medical History:  Past Medical History  Diagnosis Date  . Hypertension   . Seizure disorder   . Glaucoma   . Hyperlipidemia   . Osteopenia   . Compression fracture of spine     T 10 and T11  . Gout   . OSA (obstructive sleep apnea)     on CPAP therapy  . Hypothyroid   . Inguinal hernia   . Hemorrhoid   . Chronic systolic dysfunction of left ventricle     EF 30-35% by echo 10/10/2009  . Major depression   . History of GI bleed     from coumadin  . MR (mitral regurgitation)   . Hearing loss   . Dizziness     intermittent  . Kidney stone   . CKD (chronic kidney disease), stage III   . Permanent atrial fibrillation     refuses coumadin due to history of GI bleed  . Decreased appetite 03/02/12  . Weight loss 03/02/12  . Complete heart block   . CHF (congestive heart failure)    Past Surgical History:  Past Surgical History  Procedure Laterality Date  . Transurethral resection of prostate    . Cardiac catheterization  2002  .  Pacemaker insertion  07/28/06    VVI pacemaker for complete heart block by Dr Amil Amen  . Inguinal hernia repair    . Hemorrhoid surgery    . Colonoscopy with propofol N/A 05/01/2014    Procedure: COLONOSCOPY WITH PROPOFOL;  Surgeon: [name], MD;  Location: [facility];  Service: Endoscopy;  Laterality: N/A;  . Endarterectomy Right 10/23/2014    Procedure: ENDARTERECTOMY CAROTID;  Surgeon: Larina Earthly, MD;  Location: [facility] OR;  Service: Vascular;  Laterality: Right;   HPI:  81 y.o. male with history of HTN, HLD, OSA on CPAP, Afib on ASA, heart block on pacemaker, partial seizure on keppraadmitted with dysarthria and LUE weakness; underwent right CEA 9/19.  CT negative acute stroke.   Dysarthria resolved.   Assessment / Plan / Recommendation Clinical Impression  Pt presents with mild deficits in short-term recall; disorientation to time with acknowledgement that he is having difficulty keeping up with the dates since hospitalized.  Language, speech are [facility].  No overt concerns for cognition identified that would warrant SLP f/u at D/C.  WIll sign off.     SLP Assessment  Patient does not need any further Speech Lanaguage Pathology Services    Follow Up Recommendations  None    Frequency and Duration  Pertinent Vitals/Pain Pain Assessment: No/denies pain Pain Score: 4  Pain Location: bladder/cathter sight Pain Descriptors / Indicators: Burning   SLP Goals     SLP Evaluation Prior Functioning  Cognitive/Linguistic Baseline: Information not available Type of Home: House  Lives With: Alone (caregivers assist) Available Help at Discharge: Family;Available PRN/intermittently   Cognition  Overall Cognitive Status: No family/caregiver present to determine baseline cognitive functioning Arousal/Alertness: Awake/alert Orientation Level: Oriented to person;Oriented to place;Oriented to situation;Disoriented to time Attention: Selective Selective Attention: Appears  intact Memory: Impaired Memory Impairment: Retrieval deficit (mild) Awareness: Appears intact Problem Solving: Appears intact    Comprehension  Visual Recognition/Discrimination Discrimination: Within Function Limits    Expression Expression Primary Mode of Expression: Verbal Verbal Expression Overall Verbal Expression: Appears within functional limits for tasks assessed Written Expression Dominant Hand: Right   Oral / Motor Oral Motor/Sensory Function Overall Oral Motor/Sensory Function: Appears within functional limits for tasks assessed Motor Speech Overall Motor Speech: Appears within functional limits for tasks assessed   GO     Blenda Mounts Laurice 10/25/2014, 12:07 PM

## 2014-10-25 NOTE — Progress Notes (Addendum)
[facility] Progress Note   [name]  ZOX:[id number]  DOB: 28-Jun-1925  DOA: 10/20/2014 PCP: Kristian Covey, MD  Brief narrative: Oscar Santiago is a 81 y.o. male with hypertension, seizure disorder, dyslipidemia, sleep apnea, gout, hypothyroidism, chronic kidney disease stage III, complete heart block status post pacemaker, A. fib not on anticoagulation. The patient presents with a complaint of left-sided weakness. He specifically states that his left arm was weak she noted while buttoning his shirt and he subsequently slid off the bed and fell to the ground-left arm weakness improved slowly to a point where he was able to make a phone call. EMS noted left-sided did weakness, dysarthria and facial droop. In the ER he was found to have left arm weakness which according to the patient improved by the time he was moved out of the ER.  Had CEA on right on 9/19  Subjective: Foley placed 9/19 by urology Patient anxious to go home   Assessment/Plan:   TIA (transient ischemic attack) -Unable to obtain MRI due to his pacemaker but CT of the head does not show an acute infarct - found to have significant carotid artery stenosis on CTA-s/p CEA 9/19 - neurology also is suspicious that he may have had a seizure and recommending to continue Keppra  -Neurology recommending to start eliquis 5 mg twice a day for A. fib for secondary stroke prevention--will start when OK with vascular surgery and urinary bleed slows down - LDL 105--continue statin  Bilateral carotid artery disease -s/p CEA- right likely going to need elective endarterectomy on the left side as well in 6-8 weeks.    Chronic atrial fibrillation (CHADVASc = 5) -Apparently had some GI bleeding back in the 1990s -he states he has not had any bleeding since-no history of PUD either  - Dr. Butler Denmark discussed starting anticoagulation for stroke prevention and he is in agreement with this -Start anticoagulation after carotid  endarterectomy when ok with vacular  Chronic systolic heart failure -EF 40%-continue low-dose Lasix and watch I and O -On Diovan at home - cont Metoprolol-started by cardiology 9/17 -Also has Moderate mitral regurgitation and tricuspid regurgitation    Hypertension -Continue current medications  Seizure disorder Continue Keppra per neurology    Hyperlipidemia -Continue pravastatin    OSA (obstructive sleep apnea) -Will order C Pap    Cardiac pacemaker in situ    CKD (chronic kidney disease), stage III - stable    Hypothyroidism --Continue Synthroid  Mild thrombocytopenia and leukopenia -outpatient follow up  Acute urinary retention -start flomax -foley placed by urology -urine very bloody today- no clots seen  Hematuria -monitor  Code Status:     Code Status Orders        Start     Ordered   10/20/14 1156  Full code   Continuous     10/20/14 1155     Family Communication: no family at DVT prophylaxis: Lovenox Consultants: Neurology, vascular surgery, cardiology, urology  Antibiotics: Anti-infectives    Start     Dose/Rate Route Frequency Ordered Stop   10/23/14 2000  cefUROXime (ZINACEF) 1.5 g in dextrose 5 % 50 mL IVPB     1.5 g 100 mL/hr over 30 Minutes Intravenous Every 12 hours 10/23/14 1759 10/24/14 1906   10/23/14 0800  ceFAZolin (ANCEF) IVPB 1 g/50 mL premix    Comments:  Send with pt to OR   1 g 100 mL/hr over 30 Minutes Intravenous To ShortStay Surgical 10/22/14 1614 10/23/14 1115  Objective: Filed Weights   10/23/14 1952 10/24/14 0329 10/25/14 0528  Weight: 90.1 kg (198 lb 10.2 oz) 86.4 kg (190 lb 7.6 oz) 84.414 kg (186 lb 1.6 oz)    Intake/Output Summary (Last 24 hours) at 10/25/14 0832 Last data filed at 10/24/14 2000  Gross per 24 hour  Intake      0 ml  Output   1750 ml  Net  -1750 ml     Vitals Filed Vitals:   10/24/14 1115 10/24/14 2100 10/25/14 0100 10/25/14 0528  BP: 128/79 134/58 152/48 146/64  Pulse:  60 74  66  Temp:  97.9 F (36.6 C) 98.7 F (37.1 C) 99 F (37.2 C)  TempSrc:  Oral Oral Oral  Resp: Height:      Weight:    84.414 kg (186 lb 1.6 oz)  SpO2: 100% 100% 99% 99%    Exam:  General:  Pt is alert, not in acute distress  Cardiovascular: rrr  Respiratory: clear to auscultation bilaterally   Abdomen: Soft, +Bowel sounds, non tender, non distended, no guarding  MSK: No LE edema, cyanosis or clubbing  Data Reviewed: Basic Metabolic Panel:  Recent Labs Lab 10/20/14 0903 10/20/14 0909 10/21/14 0713 10/23/14 0536 10/24/14 0350  NA 135 138 136 133* 133*  K 4.0 3.9 3.8 3.6 4.5  CL 106 103 105 102 105  CO2 21*  --  GLUCOSE 94 93 94 95 113*  BUN CREATININE 1.33* 1.40* 1.24 1.24 1.28*  CALCIUM 8.9  --  8.6* 8.8* 8.2*   Liver Function Tests:  Recent Labs Lab 10/20/14 0903  AST 37  ALT 21  ALKPHOS 56  BILITOT 0.8  PROT 6.8  ALBUMIN 3.7   No results for input(s): LIPASE, AMYLASE in the last 168 hours. No results for input(s): AMMONIA in the last 168 hours. CBC:  Recent Labs Lab 10/20/14 0903 10/20/14 0909 10/21/14 0713 10/22/14 0617 10/23/14 0536 10/24/14 0350  WBC 2.7*  --  2.9* 2.9* 3.5* 5.3  NEUTROABS 1.0*  --   --  0.9*  --   --   HGB 13.3 15.0 13.1 12.6* 13.7 12.9*  HCT 38.7* 44.0 38.6* 37.1* 40.4 38.3*  MCV 84.1  --  84.6 85.3 84.7 85.1  PLT 124*  --  126* 119* 121* 124*   Cardiac Enzymes: No results for input(s): CKTOTAL, CKMB, CKMBINDEX, TROPONINI in the last 168 hours. BNP (last 3 results) No results for input(s): BNP in the last 8760 hours.  ProBNP (last 3 results) No results for input(s): PROBNP in the last 8760 hours.  CBG:  Recent Labs Lab 10/20/14 0922 10/23/14 0858  GLUCAP 91 79    Recent Results (from the past 240 hour(s))  Surgical pcr screen     Status: None   Collection Time: 10/22/14  9:18 PM  Result Value Ref Range Status   MRSA, PCR NEGATIVE NEGATIVE Final   Staphylococcus  aureus NEGATIVE NEGATIVE Final    Comment:        The Xpert SA Assay (FDA approved for NASAL specimens in patients over 45 years of age), is one component of a comprehensive surveillance program.  Test performance has been validated by [facility] for patients greater than or equal to 60 year old. It is not intended to diagnose infection nor to guide or monitor treatment.      Studies: No results found.  Scheduled Meds:  Scheduled Meds: . aspirin  300 mg Rectal Daily   Or  . aspirin  325 mg Oral Daily  . docusate sodium  100 mg Oral Daily  . dorzolamide-timolol  1 drop Both Eyes BID  . enoxaparin (LOVENOX) injection  40 mg Subcutaneous Daily  . furosemide  20 mg Oral Daily  . Influenza vac split quadrivalent PF  0.5 mL Intramuscular Tomorrow-1000  . irbesartan  37.5 mg Oral Daily  . latanoprost  1 drop Both Eyes QHS  . levETIRAcetam  250 mg Oral q morning - 10a  . levETIRAcetam  500 mg Oral QHS  . levothyroxine  25 mcg Oral QAC breakfast  . lidocaine  1 application Urethral Once  . lidocaine  1 application Topical Once  . loratadine  10 mg Oral Daily  . metoprolol succinate  25 mg Oral Daily  . multivitamin with minerals  1 tablet Oral Daily  . pantoprazole  40 mg Oral Daily  . pravastatin  20 mg Oral q1800  . tamsulosin  0.4 mg Oral QPC breakfast  . zolpidem  2.5 mg Oral QHS   Continuous Infusions: . lactated ringers 10 mL/hr at 10/24/14 2051    Time spent on care of this patient: 25 min   VANN, JESSICA, DO  10/25/2014, 8:32 AM  LOS: 3 days   [facility] Office  [phone] Pager - Text Page per [email or web address] If 7PM-7AM, please contact night-coverage [email or web address]

## 2014-10-25 NOTE — Progress Notes (Signed)
Pt does not wish to wear CPAP tonight. Pt will call if changes mind. No distress noted.

## 2014-10-26 DIAGNOSIS — R319 Hematuria, unspecified: Secondary | ICD-10-CM | POA: Diagnosis not present

## 2014-10-26 LAB — BASIC METABOLIC PANEL
Anion gap: 4 — ABNORMAL LOW (ref 5–15)
BUN: 16 mg/dL (ref 6–20)
CALCIUM: 8.2 mg/dL — AB (ref 8.9–10.3)
CO2: 25 mmol/L (ref 22–32)
CREATININE: 1.3 mg/dL — AB (ref 0.61–1.24)
Chloride: 105 mmol/L (ref 101–111)
GFR calc Af Amer: 55 mL/min — ABNORMAL LOW (ref >60)
GFR calc non Af Amer: 48 mL/min — ABNORMAL LOW (ref >60)
GLUCOSE: 104 mg/dL — AB (ref 65–99)
Potassium: 4 mmol/L (ref 3.5–5.1)
Sodium: 134 mmol/L — ABNORMAL LOW (ref 135–145)

## 2014-10-26 LAB — CBC
HEMATOCRIT: 32.3 % — AB (ref 39.0–52.0)
Hemoglobin: 11.3 g/dL — ABNORMAL LOW (ref 13.0–17.0)
MCH: 29 pg (ref 26.0–34.0)
MCHC: 35 g/dL (ref 30.0–36.0)
MCV: 83 fL (ref 78.0–100.0)
Platelets: 90 10*3/uL — ABNORMAL LOW (ref 150–400)
RBC: 3.89 MIL/uL — ABNORMAL LOW (ref 4.22–5.81)
RDW: 15.5 % (ref 11.5–15.5)
WBC: 4 10*3/uL (ref 4.0–10.5)

## 2014-10-26 NOTE — Progress Notes (Signed)
Occupational Therapy Treatment Patient Details Name: Oscar Santiago MRN: [id number] DOB: October 05, 1925 Today's Date: 10/26/2014    History of present illness Oscar Santiago is an 81 y.o. male presenting to the hospital. Patient awoke at 0700 hours and felt normal. He went to put on his shirt and noted his left arm was week and then fell due to his left leg giving out. EMS was called to [facility] for a fall but on arrival noted he had left sided plegia and facial droop. He was also noted to have dysarthria. On arrival to ED his dysarthria resolved but continued to have left arm weakness but NOT plegia. He only had a left arm drift. S/P L CEA   OT comments  Pt admitted with above and presents to acute OT with focus on functional bathroom transfers and increased activity tolerance.  Pt with low BP this session but requesting to attempt activity as tolerated.  Ambulated in bathroom and completed toilet transfer with RW and min guard.  Pt required increased time with sit >stand.  Returned to bed at end of session with pt reporting feeling tired due to "low blood."  Pt will continue to benefit from OT acutely to increase independence with ADLs.  Follow Up Recommendations  Supervision/Assistance - 24 hour;Home health OT    Equipment Recommendations  Tub/shower bench;Other (comment) (family will get on their own)    Recommendations for Other Services      Precautions / Restrictions Precautions Precautions: Fall Restrictions Weight Bearing Restrictions: No       Mobility Bed Mobility Overal bed mobility: Modified Independent             General bed mobility comments: increased time  Transfers Overall transfer level: Needs assistance Equipment used: Rolling walker (2 wheeled) Transfers: Sit to/from Stand Sit to Stand: Min guard         General transfer comment: Min guard as patient with posterior lean and increased effort this session, reporting "feeing bad" due to low BP. Pt  transferred recliner to bed due to decreased activity tolerance.        ADL                           Toilet Transfer: Ambulation;BSC;RW;Min guard             General ADL Comments: Addressed safety with RW during ADLs and functional transfers.  Planned to engage in tub transfer with tub bench, but due to low BP remained in room.       Pertinent Vitals/ Pain       Pain Assessment: No/denies pain         Frequency Min 2X/week     Progress Toward Goals  OT Goals(current goals can now be found in the care plan section)  Progress towards OT goals: Progressing toward goals  Acute Rehab OT Goals Patient Stated Goal: to [facility]  Plan Discharge plan remains appropriate       End of Session Equipment Utilized During Treatment: Rolling walker;Gait belt   Activity Tolerance Patient tolerated treatment well   Patient Left in bed;with call bell/phone within reach   Nurse Communication Mobility status        Time: 1413-1430 OT Time Calculation (min): 17 min  Charges: OT General Charges $OT Visit: 1 Procedure OT Treatments $Self Care/Home Management : 8-22 mins  Rosalio Loud, [phone]  10/26/2014, 2:39 PM

## 2014-10-26 NOTE — Progress Notes (Signed)
Md notified of grand daughters request for consult. Pt was seen by home Urologist on 9/20 but grand daughter states that office told her that consult was only for them to place foley and another needed to be put in. Pt's grand daughter insisted on speaking with MD, Md text paged grand daughters number to return call.

## 2014-10-26 NOTE — Progress Notes (Signed)
[facility] Progress Note   Oscar Santiago  ZOX:[id number]  DOB: September 25, 1925  DOA: 10/20/2014 PCP: Kristian Covey, MD  Brief narrative: Oscar Santiago is a 81 y.o. male with hypertension, seizure disorder, dyslipidemia, sleep apnea, gout, hypothyroidism, chronic kidney disease stage III, complete heart block status post pacemaker, A. fib not on anticoagulation. The patient presents with a complaint of left-sided weakness. He specifically states that his left arm was weak she noted while buttoning his shirt and he subsequently slid off the bed and fell to the ground-left arm weakness improved slowly to a point where he was able to make a phone call. EMS noted left-sided did weakness, dysarthria and facial droop. In the ER he was found to have left arm weakness which according to the patient improved by the time he was moved out of the ER.  Had CEA on right on 9/19  Assessment/Plan:   TIA (transient ischemic attack) -Unable to obtain MRI due to his pacemaker but CT of the head does not show an acute infarct - found to have significant carotid artery stenosis on CTA-s/p CEA 9/19 - neurology also is suspicious that he may have had a seizure and recommending to continue Keppra  -Neurology recommending to start eliquis 5 mg twice a day for A. fib for secondary stroke prevention. Currently will hold off, given the amount of hematuria he's having and his hemoglobin dropped. - LDL 105--continue statin  Bilateral carotid artery disease -s/p CEA- right likely going to need elective endarterectomy on the left side as well in 6-8 weeks.    Chronic atrial fibrillation (CHADVASc = 5) -Apparently had some GI bleeding back in the 1990s -he states he has not had any bleeding since-no history of PUD either  - Dr. Butler Denmark discussed starting anticoagulation for stroke prevention and he is in agreement with this Start eliquis once hematuria improves.  Chronic systolic heart failure -EF 40%-continue  low-dose Lasix and watch I and O -On Diovan at home - cont Metoprolol-started by cardiology 9/17 -Also has Moderate mitral regurgitation and tricuspid regurgitation    Hypertension -Continue current medications  Seizure disorder Continue Keppra per neurology    Hyperlipidemia -Continue pravastatin    OSA (obstructive sleep apnea) -Will order C Pap    Cardiac pacemaker in situ    CKD (chronic kidney disease), stage III - stable    Hypothyroidism --Continue Synthroid  Mild thrombocytopenia and leukopenia -outpatient follow up  Acute urinary retention -start flomax -Walida Cajas placed by urology Still with significant hematuria and hemoglobin dropped almost a point. Will stop Lovenox and monitor. May need to hold off on Eliquis until after discharge. Will check UA to rule out UTI. Hold off on discharge until hematuria improves.  Hematuria   Code Status:     Code Status Orders        Start     Ordered   10/20/14 1156  Full code   Continuous     10/20/14 1155     Family Communication: no family at DVT prophylaxis: Lovenox Consultants: Neurology, vascular surgery, cardiology, urology  Antibiotics: Anti-infectives    Start     Dose/Rate Route Frequency Ordered Stop   10/23/14 2000  cefUROXime (ZINACEF) 1.5 g in dextrose 5 % 50 mL IVPB     1.5 g 100 mL/hr over 30 Minutes Intravenous Every 12 hours 10/23/14 1759 10/24/14 1906   10/23/14 0800  ceFAZolin (ANCEF) IVPB 1 g/50 mL premix    Comments:  Send with pt to OR  1 g 100 mL/hr over 30 Minutes Intravenous To [facility] 10/22/14 1614 10/23/14 1115     Subjective: No complaints Objective: Filed Weights   10/23/14 1952 10/24/14 0329 10/25/14 0528  Weight: 90.1 kg (198 lb 10.2 oz) 86.4 kg (190 lb 7.6 oz) 84.414 kg (186 lb 1.6 oz)    Intake/Output Summary (Last 24 hours) at 10/26/14 1237 Last data filed at 10/26/14 0800  Gross per 24 hour  Intake      0 ml  Output   1875 ml  Net  -1875 ml      Vitals Filed Vitals:   10/25/14 2104 10/26/14 0114 10/26/14 0454 10/26/14 0958  BP: 142/62 146/51 154/54 121/44  Pulse: 73 64 59 63  Temp: 98.2 F (36.8 C) 99 F (37.2 C) 97.3 F (36.3 C) 98.6 F (37 C)  TempSrc: Axillary Oral Oral Oral  Resp: Height:      Weight:      SpO2: 99% 97% 96% 98%    Exam:  General:  Pt is alert, not in acute distress in chair  Cardiovascular: rrr  Respiratory: clear to auscultation bilaterally   Abdomen: Soft, +Bowel sounds, non tender, non distended, no guarding  GU: Ramonda Galyon catheter draining opaque dark red urine  MSK: No LE edema, cyanosis or clubbing  Data Reviewed: Basic Metabolic Panel:  Recent Labs Lab 10/21/14 0713 10/23/14 0536 10/24/14 0350 10/25/14 1044 10/26/14 0438  NA 136 133* 133* 133* 134*  K 3.8 3.6 4.5 4.1 4.0  CL 105 102 105 104 105  CO2 GLUCOSE 94 95 113* 119* 104*  BUN CREATININE 1.24 1.24 1.28* 1.27* 1.30*  CALCIUM 8.6* 8.8* 8.2* 8.5* 8.2*   Liver Function Tests:  Recent Labs Lab 10/20/14 0903  AST 37  ALT 21  ALKPHOS 56  BILITOT 0.8  PROT 6.8  ALBUMIN 3.7   No results for input(s): LIPASE, AMYLASE in the last 168 hours. No results for input(s): AMMONIA in the last 168 hours. CBC:  Recent Labs Lab 10/20/14 0903  10/22/14 0617 10/23/14 0536 10/24/14 0350 10/25/14 1044 10/26/14 0438  WBC 2.7*  < > 2.9* 3.5* 5.3 5.0 4.0  NEUTROABS 1.0*  --  0.9*  --   --   --   --   HGB 13.3  < > 12.6* 13.7 12.9* 12.4* 11.3*  HCT 38.7*  < > 37.1* 40.4 38.3* 36.1* 32.3*  MCV 84.1  < > 85.3 84.7 85.1 84.9 83.0  PLT 124*  < > 119* 121* 124* 113* 90*  < > = values in this interval not displayed. Cardiac Enzymes: No results for input(s): CKTOTAL, CKMB, CKMBINDEX, TROPONINI in the last 168 hours. BNP (last 3 results) No results for input(s): BNP in the last 8760 hours.  ProBNP (last 3 results) No results for input(s): PROBNP in the last 8760  hours.  CBG:  Recent Labs Lab 10/20/14 0922 10/23/14 0858  GLUCAP 91 79    Recent Results (from the past 240 hour(s))  Surgical pcr screen     Status: None   Collection Time: 10/22/14  9:18 PM  Result Value Ref Range Status   MRSA, PCR NEGATIVE NEGATIVE Final   Staphylococcus aureus NEGATIVE NEGATIVE Final    Comment:        The Xpert SA Assay (FDA approved for NASAL specimens in patients over 48 years of age), is one component of a comprehensive surveillance program.  Test performance has been validated by [facility] for patients greater than or equal to 27 year old. It is not intended to diagnose infection nor to guide or monitor treatment.      Studies: No results found.  Scheduled Meds:  Scheduled Meds: . aspirin  325 mg Oral Daily  . docusate sodium  100 mg Oral Daily  . dorzolamide-timolol  1 drop Both Eyes BID  . furosemide  20 mg Oral Daily  . irbesartan  37.5 mg Oral Daily  . lacosamide  50 mg Oral BID  . latanoprost  1 drop Both Eyes QHS  . levETIRAcetam  250 mg Oral q morning - 10a  . levETIRAcetam  500 mg Oral QHS  . levothyroxine  25 mcg Oral QAC breakfast  . lidocaine  1 application Urethral Once  . lidocaine  1 application Topical Once  . loratadine  10 mg Oral Daily  . metoprolol succinate  25 mg Oral Daily  . multivitamin with minerals  1 tablet Oral Daily  . pantoprazole  40 mg Oral Daily  . pravastatin  20 mg Oral q1800  . tamsulosin  0.4 mg Oral QPC breakfast  . zolpidem  2.5 mg Oral QHS   Continuous Infusions: . lactated ringers 10 mL/hr at 10/24/14 2051    Time spent on care of this patient: 25 min   SULLIVAN,CORINNA L, MD  10/26/2014, 12:37 PM  LOS: 4 days   [facility] Pager - Text Page per [email or web address]

## 2014-10-26 NOTE — Progress Notes (Signed)
Pts granddaughter called requesting for this nurse to notify  Md to put in a consult for pt's home urologist at [facility] [phone] to follow up with pt while he is in the hospital. She was told by Urology office today that Md had to put in a consult in order for pt to be seen while in the hospital. Md text paged.

## 2014-10-26 NOTE — Progress Notes (Signed)
Placed patient on home CPAP for the night  

## 2014-10-26 NOTE — Progress Notes (Signed)
Patient ID: Oscar Santiago, male   DOB: 1925/04/01, 82 y.o.   MRN: [id number] Comfortable this morning up in chair. His granddaughter who is primary caregiver is here with him in the room. Had a seizure last night. She'll with some hematuria.  Right neck incision healing without difficulty. No focal neurologic deficits.  Stable postop day 3 from right carotid endarterectomy. I am out of town tomorrow and the weekend. Please call my partners over the weekend if there are any vascular issues. I will see him in the office in 2-3 weeks for follow-up

## 2014-10-27 DIAGNOSIS — R319 Hematuria, unspecified: Secondary | ICD-10-CM

## 2014-10-27 LAB — CBC
HCT: 31.4 % — ABNORMAL LOW (ref 39.0–52.0)
HEMOGLOBIN: 11 g/dL — AB (ref 13.0–17.0)
MCH: 29.6 pg (ref 26.0–34.0)
MCHC: 35 g/dL (ref 30.0–36.0)
MCV: 84.6 fL (ref 78.0–100.0)
Platelets: 115 10*3/uL — ABNORMAL LOW (ref 150–400)
RBC: 3.71 MIL/uL — AB (ref 4.22–5.81)
RDW: 15.7 % — ABNORMAL HIGH (ref 11.5–15.5)
WBC: 3.5 10*3/uL — ABNORMAL LOW (ref 4.0–10.5)

## 2014-10-27 LAB — BASIC METABOLIC PANEL
ANION GAP: 7 (ref 5–15)
BUN: 16 mg/dL (ref 6–20)
CALCIUM: 8.3 mg/dL — AB (ref 8.9–10.3)
CO2: 24 mmol/L (ref 22–32)
Chloride: 104 mmol/L (ref 101–111)
Creatinine, Ser: 1.19 mg/dL (ref 0.61–1.24)
GFR, EST NON AFRICAN AMERICAN: 53 mL/min — AB (ref >60)
Glucose, Bld: 102 mg/dL — ABNORMAL HIGH (ref 65–99)
Potassium: 3.7 mmol/L (ref 3.5–5.1)
Sodium: 135 mmol/L (ref 135–145)

## 2014-10-27 LAB — GLUCOSE, CAPILLARY: GLUCOSE-CAPILLARY: 103 mg/dL — AB (ref 65–99)

## 2014-10-27 NOTE — Clinical Documentation Improvement (Signed)
Hospitalist  Can the diagnosis of anemia be further specified?   Iron deficiency Anemia  Nutritional anemia, including the nutrition or mineral deficits  Chronic Anemia, including the suspected or known cause  Anemia of chronic disease, including the associated chronic disease state  Other  Clinically Undetermined  Document any associated diagnoses/conditions.  Supporting Information: 10/27/14 progr note.Marland KitchenMarland Kitchen"Still with severe gross hematuria and hemoglobin dropped almost 1.5. Will stop Lovenox and monitor. May need to hold off on Eliquis until after discharge.".Marland KitchenMarland Kitchen  10/23/2014 05:36 10/24/2014 03:50 10/25/2014 10:44 10/26/2014 04:38 10/27/2014 05:24  Hemoglobin 13.7 12.9 (L) 12.4 (L) 11.3 (L) 11.0 (L)  HCT 40.4 38.3 (L) 36.1 (L) 32.3 (L) 31.4 (L)   Please exercise your independent, professional judgment when responding. A specific answer is not anticipated or expected.   Thank You,  Toribio Harbour, RN, BSN, CCDS Certified Clinical Documentation Specialist Pager: [phone] [place]: Health Information Management

## 2014-10-27 NOTE — Progress Notes (Signed)
4 Days Post-Op Subjective: Patient reports Feels fine.  No abdominal pain.  Eating well  Objective: Vital signs in last 24 hours: Temp:  [97.6 F (36.4 C)-98.8 F (37.1 C)] 98.2 F (36.8 C) (09/23 1342) Pulse Rate:  [63-87] 66 (09/23 1342) Resp:  [16-18] 16 (09/23 1342) BP: (123-143)/(56-100) 123/57 mmHg (09/23 1342) SpO2:  [100 %] 100 % (09/23 1342)  Intake/Output from previous day: 09/22 0701 - 09/23 0700 In: -  Out: 1700 [Urine:1700] Intake/Output this shift:    Physical Exam:  General:In no distress ZO:XWRU, non distended, non tender  Foley draining well.  Urine clearing up.    Lab Results:  Recent Labs  10/25/14 1044 10/26/14 0438 10/27/14 0524  HGB 12.4* 11.3* 11.0*  HCT 36.1* 32.3* 31.4*   BMET  Recent Labs  10/26/14 0438 10/27/14 0524  NA 134* 135  K 4.0 3.7  CL 105 104  CO2 25 24  GLUCOSE 104* 102*  BUN 16 16  CREATININE 1.30* 1.19  CALCIUM 8.2* 8.3*   No results for input(s): LABPT, INR in the last 72 hours. No results for input(s): LABURIN in the last 72 hours. Results for orders placed or performed during the hospital encounter of 10/20/14  Surgical pcr screen     Status: None   Collection Time: 10/22/14  9:18 PM  Result Value Ref Range Status   MRSA, PCR NEGATIVE NEGATIVE Final   Staphylococcus aureus NEGATIVE NEGATIVE Final    Comment:        The Xpert SA Assay (FDA approved for NASAL specimens in patients over 16 years of age), is one component of a comprehensive surveillance program.  Test performance has been validated by [facility] for patients greater than or equal to 64 year old. It is not intended to diagnose infection nor to guide or monitor treatment.     Assessment/Plan: Gross hematuria. Urinary retention BPH Suggest: Leave Foley indwelling                 Continue tamsulosin                 Hold off Eliquis till urine is completely clear                 Can be discharged home with indwelling Foley catheter                Will follow as outpatient for voiding trial, cystoscopy.                 Please call us if we are needed.    LOS: 5 days   Danae Chen 10/27/2014, 7:12 PM

## 2014-10-27 NOTE — Care Management Important Message (Signed)
Important Message  Patient Details  Name: ZYHEIR DAFT MRN: [id number] Date of Birth: 01-14-1926   Medicare Important Message Given:  Yes-third notification given    Bernadette Hoit 10/27/2014, 11:10 AM

## 2014-10-27 NOTE — Progress Notes (Signed)
RN placed patient on his home CPAP. RT checked it and ensured it was filled with sterile water. RT will continue to monitor as needed.

## 2014-10-27 NOTE — Progress Notes (Signed)
[facility] Progress Note   Oscar Santiago  ZOX:[id number]  DOB: 11-15-1925  DOA: 10/20/2014 PCP: Kristian Covey, MD  Brief narrative: Oscar Santiago is a 81 y.o. male with hypertension, seizure disorder, dyslipidemia, sleep apnea, gout, hypothyroidism, chronic kidney disease stage III, complete heart block status post pacemaker, A. fib not on anticoagulation. The patient presents with a complaint of left-sided weakness. He specifically states that his left arm was weak she noted while buttoning his shirt and he subsequently slid off the bed and fell to the ground-left arm weakness improved slowly to a point where he was able to make a phone call. EMS noted left-sided did weakness, dysarthria and facial droop. In the ER he was found to have left arm weakness which according to the patient improved by the time he was moved out of the ER.  Had CEA on right on 9/19  Assessment/Plan:   TIA (transient ischemic attack) -Unable to obtain MRI due to his pacemaker but CT of the head does not show an acute infarct - found to have significant carotid artery stenosis on CTA-s/p CEA 9/19 - neurology also is suspicious that he may have had a seizure and recommending to continue Keppra  -Neurology recommending to start eliquis 5 mg twice a day for A. fib for secondary stroke prevention. Currently will hold off, given the amount of hematuria he's having and his hemoglobin dropped. - LDL 105--continue statin  Bilateral carotid artery disease -s/p CEA- right likely going to need elective endarterectomy on the left side as well in 6-8 weeks.    Chronic atrial fibrillation (CHADVASc = 5) -Apparently had some GI bleeding back in the 1990s -he states he has not had any bleeding since-no history of PUD either  - Dr. Butler Denmark discussed starting anticoagulation for stroke prevention and he is in agreement with this Start eliquis once hematuria improves.  Acute urinary retention -started flomax -coudet  placed by urology. Dr. Cassell Smiles recommends keeping Foley in place until office follow-up.  Hematuria: Started after catheter placed. Still with severe gross hematuria and hemoglobin dropped almost 1.5. Will stop Lovenox and monitor. May need to hold off on Eliquis until after discharge. Urinalysis negative for UTI. Had a long discussion with patient's family member yesterday who reports patient has had severe but intermittent gross hematuria in the past. I am concerned Eliquis may worsen this. I've spoken to Dr. Brunilda Payor about severe ongoing hematuria, and previous history of same. He will evaluate the patient today and comment on the safety of anticoagulation in the future.  Chronic systolic heart failure -EF 40%-continue low-dose Lasix and watch I and O -On Diovan at home - cont Metoprolol-started by cardiology 9/17 -Also has Moderate mitral regurgitation and tricuspid regurgitation    Hypertension -Continue current medications  Seizure disorder Continue Keppra per neurology    Hyperlipidemia -Continue pravastatin    OSA (obstructive sleep apnea) -Will order C Pap    Cardiac pacemaker in situ    CKD (chronic kidney disease), stage III - stable    Hypothyroidism --Continue Synthroid  Mild thrombocytopenia and leukopenia -outpatient follow up   Code Status:     Code Status Orders        Start     Ordered   10/20/14 1156  Full code   Continuous     10/20/14 1155     Family Communication: no family at DVT prophylaxis: Lovenox Consultants: Neurology, vascular surgery, cardiology, urology  Antibiotics: Anti-infectives    Start  Dose/Rate Route Frequency Ordered Stop   10/23/14 2000  cefUROXime (ZINACEF) 1.5 g in dextrose 5 % 50 mL IVPB     1.5 g 100 mL/hr over 30 Minutes Intravenous Every 12 hours 10/23/14 1759 10/24/14 1906   10/23/14 0800  ceFAZolin (ANCEF) IVPB 1 g/50 mL premix    Comments:  Send with pt to OR   1 g 100 mL/hr over 30 Minutes Intravenous To  [facility] 10/22/14 1614 10/23/14 1115     Subjective: No complaints Objective: Filed Weights   10/23/14 1952 10/24/14 0329 10/25/14 0528  Weight: 90.1 kg (198 lb 10.2 oz) 86.4 kg (190 lb 7.6 oz) 84.414 kg (186 lb 1.6 oz)    Intake/Output Summary (Last 24 hours) at 10/27/14 0816 Last data filed at 10/27/14 0647  Gross per 24 hour  Intake      0 ml  Output   1350 ml  Net  -1350 ml     Vitals Filed Vitals:   10/26/14 1349 10/26/14 1826 10/27/14 0132 10/27/14 0643  BP: 104/40 141/86 143/100 134/64  Pulse: 117 61 63 64  Temp: 97.8 F (36.6 C) 98.4 F (36.9 C) 98.8 F (37.1 C) 97.6 F (36.4 C)  TempSrc: Oral Oral Oral Oral  Resp: Height:      Weight:      SpO2: 98% 99% 100% 100%    Exam:  General:  Pt is alert, not in acute distress in chair  Cardiovascular: rrr  Respiratory: clear to auscultation bilaterally   Abdomen: Soft, +Bowel sounds, non tender, non distended, no guarding  GU: Foley catheter draining opaque dark red urine  MSK: No LE edema, cyanosis or clubbing  Data Reviewed: Basic Metabolic Panel:  Recent Labs Lab 10/23/14 0536 10/24/14 0350 10/25/14 1044 10/26/14 0438 10/27/14 0524  NA 133* 133* 133* 134* 135  K 3.6 4.5 4.1 4.0 3.7  CL 102 105 104 105 104  CO2 GLUCOSE 95 113* 119* 104* 102*  BUN CREATININE 1.24 1.28* 1.27* 1.30* 1.19  CALCIUM 8.8* 8.2* 8.5* 8.2* 8.3*   Liver Function Tests:  Recent Labs Lab 10/20/14 0903  AST 37  ALT 21  ALKPHOS 56  BILITOT 0.8  PROT 6.8  ALBUMIN 3.7   No results for input(s): LIPASE, AMYLASE in the last 168 hours. No results for input(s): AMMONIA in the last 168 hours. CBC:  Recent Labs Lab 10/20/14 0903  10/22/14 0617 10/23/14 0536 10/24/14 0350 10/25/14 1044 10/26/14 0438 10/27/14 0524  WBC 2.7*  < > 2.9* 3.5* 5.3 5.0 4.0 3.5*  NEUTROABS 1.0*  --  0.9*  --   --   --   --   --   HGB 13.3  < > 12.6* 13.7 12.9* 12.4* 11.3* 11.0*   HCT 38.7*  < > 37.1* 40.4 38.3* 36.1* 32.3* 31.4*  MCV 84.1  < > 85.3 84.7 85.1 84.9 83.0 84.6  PLT 124*  < > 119* 121* 124* 113* 90* 115*  < > = values in this interval not displayed. Cardiac Enzymes: No results for input(s): CKTOTAL, CKMB, CKMBINDEX, TROPONINI in the last 168 hours. BNP (last 3 results) No results for input(s): BNP in the last 8760 hours.  ProBNP (last 3 results) No results for input(s): PROBNP in the last 8760 hours.  CBG:  Recent Labs Lab 10/20/14 0922 10/23/14 0858 10/27/14 0037  GLUCAP 91 79 103*    Recent Results (from the  past 240 hour(s))  Surgical pcr screen     Status: None   Collection Time: 10/22/14  9:18 PM  Result Value Ref Range Status   MRSA, PCR NEGATIVE NEGATIVE Final   Staphylococcus aureus NEGATIVE NEGATIVE Final    Comment:        The Xpert SA Assay (FDA approved for NASAL specimens in patients over 32 years of age), is one component of a comprehensive surveillance program.  Test performance has been validated by [facility] for patients greater than or equal to 58 year old. It is not intended to diagnose infection nor to guide or monitor treatment.      Studies: No results found.  Scheduled Meds:  Scheduled Meds: . aspirin  325 mg Oral Daily  . docusate sodium  100 mg Oral Daily  . dorzolamide-timolol  1 drop Both Eyes BID  . furosemide  20 mg Oral Daily  . irbesartan  37.5 mg Oral Daily  . lacosamide  50 mg Oral BID  . latanoprost  1 drop Both Eyes QHS  . levETIRAcetam  250 mg Oral q morning - 10a  . levETIRAcetam  500 mg Oral QHS  . levothyroxine  25 mcg Oral QAC breakfast  . lidocaine  1 application Urethral Once  . lidocaine  1 application Topical Once  . loratadine  10 mg Oral Daily  . metoprolol succinate  25 mg Oral Daily  . multivitamin with minerals  1 tablet Oral Daily  . pantoprazole  40 mg Oral Daily  . pravastatin  20 mg Oral q1800  . tamsulosin  0.4 mg Oral QPC breakfast  . zolpidem  2.5 mg Oral  QHS   Continuous Infusions: . lactated ringers 10 mL/hr at 10/24/14 2051    Time spent on care of this patient: 25 min   SULLIVAN,CORINNA L, MD  10/27/2014, 8:16 AM  LOS: 5 days   [facility] Pager - Text Page per [email or web address] password [facility]

## 2014-10-27 NOTE — Progress Notes (Signed)
Physical Therapy Treatment Patient Details Name: Oscar Santiago MRN: [id number] DOB: August 19, 1925 Today's Date: 10/27/2014    History of Present Illness Oscar Santiago is an 81 y.o. male presenting to the hospital. Patient awoke at 0700 hours and felt normal. He went to put on his shirt and noted his left arm was week and then fell due to his left leg giving out. EMS was called to [facility] for a fall but on arrival noted he had left sided plegia and facial droop. He was also noted to have dysarthria. On arrival to ED his dysarthria resolved but continued to have left arm weakness but NOT plegia. He only had a left arm drift. S/P L CEA    PT Comments    Patient progressing with activity tolerance and strength.  Able to negotiate stairs today with supervision and tolerated standing LE therex.  Feel patient appropriate for home with assist and HHPT.  Follow Up Recommendations  Home health PT;Supervision/Assistance - 24 hour     Equipment Recommendations  Rolling walker with 5" wheels    Recommendations for Other Services       Precautions / Restrictions Precautions Precautions: Fall    Mobility  Bed Mobility               General bed mobility comments: patient up in chair  Transfers Overall transfer level: Needs assistance Equipment used: Rolling walker (2 wheeled) Transfers: Sit to/from [employer] Transfers Sit to Stand: Min guard Stand pivot transfers: Min guard       General transfer comment: initially with supervision, but pt very slow and unable to rise fully up and place hands on walker until given minguard assist, then when up to [facility] supervision level assist, but minguard for safety pivoting with walker to/from [facility]  Ambulation/Gait Ambulation/Gait assistance: Supervision Ambulation Distance (Feet): 250 Feet Assistive device: Rolling walker (2 wheeled) Gait Pattern/deviations: Step-through pattern;Trunk flexed;Decreased stride length     General Gait  Details: cues for head up and proximity to walker   Stairs Stairs: Yes Stairs assistance: Supervision Stair Management: Two rails;Alternating pattern;Forwards Number of Stairs: 2    Wheelchair Mobility    Modified Rankin (Stroke Patients Only) Modified Rankin (Stroke Patients Only) Pre-Morbid Rankin Score: Slight disability Modified Rankin: Moderately severe disability     Balance Overall balance assessment: Needs assistance           Standing balance-Leahy Scale: Fair                      Cognition Arousal/Alertness: Awake/alert Behavior During Therapy: WFL for tasks assessed/performed Overall Cognitive Status: No family/caregiver present to determine baseline cognitive functioning                      Exercises Other Exercises Other Exercises: forward step ups x 10 to 6" step with UE support, heel raises and hip abduction x 10    General Comments        Pertinent Vitals/Pain Pain Assessment: No/denies pain    Home Living                      Prior Function            PT Goals (current goals can now be found in the care plan section) Progress towards PT goals: Progressing toward goals    Frequency  Min 3X/week    PT Plan Current plan remains appropriate    Co-evaluation  End of Session Equipment Utilized During Treatment: Gait belt Activity Tolerance: Patient tolerated treatment well Patient left: in chair;with call bell/phone within reach     Time: 1528-1600 PT Time Calculation (min) (ACUTE ONLY): 32 min  Charges:  $Gait Training: 8-22 mins $Therapeutic Activity: 8-22 mins                    G Codes:      WYNN,CYNDI 2014-11-17, 4:10 PM  Sheran Lawless, PT [phone] Nov 17, 2014

## 2014-10-28 LAB — CBC
HCT: 31.9 % — ABNORMAL LOW (ref 39.0–52.0)
Hemoglobin: 11 g/dL — ABNORMAL LOW (ref 13.0–17.0)
MCH: 29.3 pg (ref 26.0–34.0)
MCHC: 34.5 g/dL (ref 30.0–36.0)
MCV: 84.8 fL (ref 78.0–100.0)
PLATELETS: 118 10*3/uL — AB (ref 150–400)
RBC: 3.76 MIL/uL — AB (ref 4.22–5.81)
RDW: 15.6 % — AB (ref 11.5–15.5)
WBC: 3.8 10*3/uL — ABNORMAL LOW (ref 4.0–10.5)

## 2014-10-28 MED ORDER — PRAVASTATIN SODIUM 20 MG PO TABS: 20.0000 mg | ORAL_TABLET | Freq: Every day | ORAL | Status: DC

## 2014-10-28 MED ORDER — APIXABAN 5 MG PO TABS: 5.0000 mg | ORAL_TABLET | Freq: Two times a day (BID) | ORAL | Status: DC

## 2014-10-28 MED ORDER — ASPIRIN 325 MG PO TABS: 325.0000 mg | ORAL_TABLET | Freq: Every day | ORAL | Status: DC

## 2014-10-28 MED ORDER — TAMSULOSIN HCL 0.4 MG PO CAPS: 0.4000 mg | ORAL_CAPSULE | Freq: Every day | ORAL | Status: DC

## 2014-10-28 MED ORDER — LACOSAMIDE 50 MG PO TABS: 50.0000 mg | ORAL_TABLET | Freq: Two times a day (BID) | ORAL | Status: DC

## 2014-10-28 NOTE — Discharge Summary (Signed)
Physician Discharge Summary  Oscar Santiago ZOX:[id number] DOB: [birth year] DOA: 10/20/2014  PCP: Kristian Covey, MD  Admit date: 10/20/2014 Discharge date: 10/28/2014  Time spent: greater than 30 minutes  Recommendations for Outpatient Follow-up:  1. Outpatient f/u with urology Home PT, OT, RN arranged Home with bladder catheter  Discharge Diagnoses:  Principal Problem:   TIA (transient ischemic attack) Active Problems:   Chronic atrial fibrillation (CHADVASc = 5)   Hypertension   Hyperlipidemia   OSA (obstructive sleep apnea)   Cardiac pacemaker in situ   CKD (chronic kidney disease), stage III   Seizure disorder   Hypothyroidism   Leukopenia   Thrombocytopenia   Chronic systolic congestive heart failure, NYHA class 1   Hematuria   Discharge Condition: stable  Diet recommendation: heart healthy  Filed Weights   10/25/14 0528 10/27/14 1900 10/28/14 0500  Weight: 84.414 kg (186 lb 1.6 oz) 89.8 kg (197 lb 15.6 oz) 88.7 kg (195 lb 8.8 oz)    History of present illness/Hospital Course:  81 y.o. male with hypertension, seizure disorder, dyslipidemia, sleep apnea, gout, hypothyroidism, chronic kidney disease stage III, complete heart block status post pacemaker, A. fib not on anticoagulation. The patient presents with a complaint of left-sided weakness. He specifically states that his left arm was weak she noted while buttoning his shirt and he subsequently slid off the bed and fell to the ground-left arm weakness improved slowly to a point where he was able to make a phone call. EMS noted left-sided did weakness, dysarthria and facial droop. In the ER he was found to have left arm weakness which according to the patient improved by the time he was moved out of the ER.   TIA (transient ischemic attack) -Unable to obtain MRI due to his pacemaker but CT of the head does not show an acute infarct - found to have significant carotid artery stenosis on CTA-s/p CEA 9/19 -  neurology also is suspicious that he may have had a seizure and recommending to continue Keppra and resume vimpat -Neurology recommending to start eliquis 5 mg twice a day for A. fib for secondary stroke prevention. See below - LDL 105--continue statin  Bilateral carotid artery disease. Vascular surgery consulted. Cardiology consulted for preoperative clearance. Had uneventful right CEA likely going to need elective endarterectomy on the left side as well in 6-8 weeks.   Chronic atrial fibrillation (CHADVASc = 5) -Apparently had some GI bleeding back in the 1990s -he states he has not had any bleeding since-no history of PUD either  Start eliquis once hematuria improves.  Acute urinary retention -started flomax -coudet placed by urology. Dr. Cassell Smiles recommends keeping Foley in place until office follow-up.  Hematuria: Started after catheter placed.  stoped Lovenox. Urinalysis negative for UTI. has had severe but intermittent gross hematuria in the past. Dr. Brunilda Payor saw patient and recommends holding eliquis until bleeding stopped.  Will continue ASA 325 mg daily for a week, then change to Eliquis if no further bleeding. At discharge, urine clear  Chronic systolic heart failure Compensated. Metoprolol started by cardiology   Hypertension stable  Seizure disorder Continue Keppra per neurology. Vimpat resumed   Hyperlipidemia -Continue pravastatin   OSA (obstructive sleep apnea) On CPAP   Cardiac pacemaker in situ   CKD (chronic kidney disease), stage III - stable   Hypothyroidism --Continue Synthroid  Mild thrombocytopenia and leukopenia -outpatient follow up  Procedures:  Right CEA  Consultations:  Neurology  Urology  Vascular surgery  cardiology  Discharge Exam:  Filed Vitals:   10/28/14 0548  BP: 138/72  Pulse: 77  Temp: 99.1 F (37.3 C)  Resp: 16    General: a and o Cardiovascular: RRR Respiratory: CTA GU: catheter with yellow  urine Neuro: nonfocal  Discharge Instructions   Discharge Instructions    Ambulatory referral to Neurology    Complete by:  As directed   An appointment is requested in approximately: 2 months     Diet - low sodium heart healthy    Complete by:  As directed      Discharge instructions    Complete by:  As directed   Continue aspirin 325 mg daily for one week, then stop once Eliquis started. Start Eliquis in one week if no further blood in urine     Walker     Complete by:  As directed           Current Discharge Medication List    START taking these medications   Details  apixaban (ELIQUIS) 5 MG TABS tablet Take 1 tablet (5 mg total) by mouth 2 (two) times daily. Start in one week if no further blood in urine Qty: 60 tablet, Refills: 1    aspirin 325 MG tablet Take 1 tablet (325 mg total) by mouth daily. Stop aspirin once eliquis started    pravastatin (PRAVACHOL) 20 MG tablet Take 1 tablet (20 mg total) by mouth daily at 6 PM. Qty: 30 tablet, Refills: 1    tamsulosin (FLOMAX) 0.4 MG CAPS capsule Take 1 capsule (0.4 mg total) by mouth daily after breakfast. Qty: 30 capsule, Refills: 1      CONTINUE these medications which have CHANGED   Details  lacosamide (VIMPAT) 50 MG TABS tablet Take 1 tablet (50 mg total) by mouth 2 (two) times daily. Qty: 60 tablet, Refills: 1      CONTINUE these medications which have NOT CHANGED   Details  acetaminophen (TYLENOL) 500 MG tablet Take 500 mg by mouth every 6 (six) hours as needed (pain).    alendronate (FOSAMAX) 70 MG tablet Take 70 mg by mouth every Monday.     amLODipine (NORVASC) 10 MG tablet Take 5 mg by mouth daily as needed (diastolic blood pressure over 90).     cetirizine (ZYRTEC) 10 MG tablet Take 10 mg by mouth daily.    colchicine 0.6 MG tablet TAKE 1 TABLET TWICE DAILY Qty: 30 tablet, Refills: 4    Dextromethorphan-Guaifenesin (CORICIDIN HBP CONGESTION/COUGH) 10-200 MG CAPS Take 1 capsule by mouth daily as needed  (for cold).     dorzolamide-timolol (COSOPT) 22.3-6.8 MG/ML ophthalmic solution Place 1 drop into both eyes 2 (two) times daily. Refills: 6    famotidine (PEPCID AC) 10 MG chewable tablet Chew 10 mg by mouth daily as needed for heartburn.     fluticasone (FLONASE) 50 MCG/ACT nasal spray Place 2 sprays into both nostrils daily as needed for allergies or rhinitis.      furosemide (LASIX) 20 MG tablet Take 1 tablet (20 mg total) by mouth daily. Qty: 30 tablet, Refills: 11    latanoprost (XALATAN) 0.005 % ophthalmic solution Place 1 drop into both eyes at bedtime.     levETIRAcetam (KEPPRA) 500 MG tablet Take 250-500 mg by mouth 2 (two) times daily. Take 1/2 tablet (250 mg) by mouth every morning and 1 tablet (500 mg) every night    levothyroxine (SYNTHROID, LEVOTHROID) 25 MCG tablet TAKE 1 TABLET (25 MCG TOTAL) BY MOUTH DAILY BEFORE BREAKFAST. Qty: 90 tablet, Refills: 1  Misc Natural Products (BLACK CHERRY CONCENTRATE PO) Take 1 capsule by mouth daily.    Multiple Vitamin (MULTIVITAMIN WITH MINERALS) TABS tablet Take 1 tablet by mouth daily.    valsartan (DIOVAN) 320 MG tablet Take 1 tablet (320 mg total) by mouth daily with lunch. Qty: 30 tablet, Refills: 11    vitamin B-12 (CYANOCOBALAMIN) 1000 MCG tablet Take 1,000 mcg by mouth daily.    zolpidem (AMBIEN) 5 MG tablet TAKE 1 TABLET AT BEDTIME Qty: 30 tablet, Refills: 2      STOP taking these medications     aspirin EC 81 MG tablet        Allergies  Allergen Reactions  . Coumadin [Warfarin Sodium] Other (See Comments)    GI Bleed   . Lisinopril Cough   Follow-up Information    Follow up with Xu,Jindong, MD. Schedule an appointment as soon as possible for a visit in 2 months.   Specialty:  Neurology   Why:  stroke clinic   Contact information:   [address] [place] [address] [phone]       Follow up with Kathi Ludwig, MD.   Specialty:  Urology   Why:  call office for appt  within 1 week after discharge from hospital   Contact information:   [address] [place] [address] [phone]       Follow up with [name], MD In 1 week.   Specialties:  Vascular Surgery, Cardiology   Contact information:   [address] [place] [address] [phone]        The results of significant diagnostics from this hospitalization (including imaging, microbiology, ancillary and laboratory) are listed below for reference.    Significant Diagnostic Studies: Ct Angio Head W/cm &/or Wo Cm  10/20/2014   CLINICAL DATA:  Gait disturbance. Slurred speech. Seizures. Atrial fibrillation. Symptoms began today.  EXAM: CT ANGIOGRAPHY HEAD AND NECK  TECHNIQUE: Multidetector CT imaging of the head and neck was performed using the standard protocol during bolus administration of intravenous contrast. Multiplanar CT image reconstructions and MIPs were obtained to evaluate the vascular anatomy. Carotid stenosis measurements (when applicable) are obtained utilizing NASCET criteria, using the distal internal carotid diameter as the denominator.  CONTRAST:  50mL OMNIPAQUE IOHEXOL 350 MG/ML SOLN  COMPARISON:  CT earlier same day.  FINDINGS: CT HEAD  Brain shows generalized atrophy with mild chronic small-vessel changes of the white matter. No sign of acute infarction, mass lesion, hemorrhage, hydrocephalus or extra-axial collection.  CTA NECK  Aortic arch: Mild atherosclerosis of the arch without aneurysm or dissection. Branching pattern of the brachiocephalic vessels from the arch is normal without origin stenosis.  Right carotid system: Common carotid artery widely patent to the bifurcation. There is advanced atherosclerotic disease affecting the proximal internal carotid artery. Minimal diameter in the bulb is 1.5 mm. Compared to a more distal cervical ICA diameter of 5 mm, this indicates 70% stenosis.  Left carotid system: Common carotid artery widely patent to the bifurcation region.  Complex atherosclerotic disease of the ICA bulb with minimal diameter of 1 mm. Compared to a more distal cervical ICA diameter of 5 mm, this indicates an 80% stenosis.  Vertebral arteries:The left vertebral artery is dominant. There is 20% stenosis at the origin. The vessel is widely patent beyond that. The nondominant right vertebral artery is widely patent at its origin and through the cervical region.  Skeleton: Ordinary spondylosis  Other neck: No significant soft tissue lesion no upper  chest lesion.  CTA HEAD  Anterior circulation: Internal carotid arteries are widely patent through the skullbase. There is atherosclerotic calcification in the siphon regions but no stenosis greater than 30-40%. Supra clinoid internal carotid arteries are widely patent. The anterior and middle cerebral vessels are patent without proximal stenosis, aneurysm or vascular malformation. No missing branches are appreciated.  Posterior circulation: Both vertebral arteries are widely patent through the foramen magnum to the basilar. No basilar stenosis. Posterior circulation branch vessels are patent.  Venous sinuses: Patent and normal  Anatomic variants: None significant  IMPRESSION: No CT or CTA evidence of acute intracranial infarction.  70% stenosis of the proximal right internal carotid artery.  80% stenosis of the proximal left internal carotid artery.  Atherosclerotic disease in the carotid siphon regions with maximal stenosis estimated at 30-40% on both sides.   Electronically Signed   By: Paulina Fusi M.D.   On: 10/20/2014 20:32   Dg Chest 2 View  10/20/2014   CLINICAL DATA:  Atrial fibrillation.  TIA.  EXAM: CHEST  2 VIEW  COMPARISON:  07/25/2014.  FINDINGS: Cardiac pacer with lead tip projected over right ventricle. Cardiomegaly with normal pulmonary vascularity. Low lung volumes with mild bibasilar atelectasis. No pleural effusion or pneumothorax. Diffuse degenerative change and osteopenia thoracic spine with stable  compression fractures.  IMPRESSION: 1. Stable cardiomegaly.  No CHF.  Cardiac pacer stable position. 2. Low lung volumes with mild bibasilar subsegmental atelectasis.   Electronically Signed   By: Maisie Fus  Register   On: 10/20/2014 14:52   Ct Head Wo Contrast  10/20/2014   CLINICAL DATA:  Confusion, slurred speech.  EXAM: CT HEAD WITHOUT CONTRAST  TECHNIQUE: Contiguous axial images were obtained from the base of the skull through the vertex without intravenous contrast.  COMPARISON:  CT scan of September 20, 2014.  FINDINGS: Bony calvarium appears intact. Moderate diffuse cortical atrophy is noted. Diffuse ventricular dilatation is noted consistent with the degree of atrophy. Minimal chronic ischemic white matter disease is noted. No mass effect or midline shift is noted. There is no evidence of mass lesion, hemorrhage or acute infarction.  IMPRESSION: Moderate diffuse cortical atrophy. Minimal chronic ischemic white matter disease. No acute intracranial abnormality seen. These results were called by telephone at the time of interpretation on 10/20/2014 at 9:24 am to Dr. Roseanne Reno, who verbally acknowledged these results.   Electronically Signed   By: Lupita Raider, M.D.   On: 10/20/2014 09:25   Ct Angio Neck W/cm &/or Wo/cm  10/20/2014   CLINICAL DATA:  Gait disturbance. Slurred speech. Seizures. Atrial fibrillation. Symptoms began today.  EXAM: CT ANGIOGRAPHY HEAD AND NECK  TECHNIQUE: Multidetector CT imaging of the head and neck was performed using the standard protocol during bolus administration of intravenous contrast. Multiplanar CT image reconstructions and MIPs were obtained to evaluate the vascular anatomy. Carotid stenosis measurements (when applicable) are obtained utilizing NASCET criteria, using the distal internal carotid diameter as the denominator.  CONTRAST:  50mL OMNIPAQUE IOHEXOL 350 MG/ML SOLN  COMPARISON:  CT earlier same day.  FINDINGS: CT HEAD  Brain shows generalized atrophy with mild  chronic small-vessel changes of the white matter. No sign of acute infarction, mass lesion, hemorrhage, hydrocephalus or extra-axial collection.  CTA NECK  Aortic arch: Mild atherosclerosis of the arch without aneurysm or dissection. Branching pattern of the brachiocephalic vessels from the arch is normal without origin stenosis.  Right carotid system: Common carotid artery widely patent to the bifurcation. There is advanced atherosclerotic disease  affecting the proximal internal carotid artery. Minimal diameter in the bulb is 1.5 mm. Compared to a more distal cervical ICA diameter of 5 mm, this indicates 70% stenosis.  Left carotid system: Common carotid artery widely patent to the bifurcation region. Complex atherosclerotic disease of the ICA bulb with minimal diameter of 1 mm. Compared to a more distal cervical ICA diameter of 5 mm, this indicates an 80% stenosis.  Vertebral arteries:The left vertebral artery is dominant. There is 20% stenosis at the origin. The vessel is widely patent beyond that. The nondominant right vertebral artery is widely patent at its origin and through the cervical region.  Skeleton: Ordinary spondylosis  Other neck: No significant soft tissue lesion no upper chest lesion.  CTA HEAD  Anterior circulation: Internal carotid arteries are widely patent through the skullbase. There is atherosclerotic calcification in the siphon regions but no stenosis greater than 30-40%. Supra clinoid internal carotid arteries are widely patent. The anterior and middle cerebral vessels are patent without proximal stenosis, aneurysm or vascular malformation. No missing branches are appreciated.  Posterior circulation: Both vertebral arteries are widely patent through the foramen magnum to the basilar. No basilar stenosis. Posterior circulation branch vessels are patent.  Venous sinuses: Patent and normal  Anatomic variants: None significant  IMPRESSION: No CT or CTA evidence of acute intracranial infarction.   70% stenosis of the proximal right internal carotid artery.  80% stenosis of the proximal left internal carotid artery.  Atherosclerotic disease in the carotid siphon regions with maximal stenosis estimated at 30-40% on both sides.   Electronically Signed   By: Paulina Fusi M.D.   On: 10/20/2014 20:32  echo Left ventricle: Hypokinesis of the anterior wall and the anterior septum. The cavity size was normal. Wall thickness was increased in a pattern of mild LVH. The estimated ejection fraction was 40%. - Mitral valve: There was moderate regurgitation. - Left atrium: The atrium was severely dilated. - Right ventricle: The cavity size was mildly dilated. Systolic function was mildly reduced. - Right atrium: The atrium was mildly dilated. - Tricuspid valve: There was moderate regurgitation. - Pulmonary arteries: PA peak pressure: 35 mm Hg (S). - Impressions: No cardiac source of embolism was identified, but cannot be ruled out on the basis of this examination.  Impressions:  - No cardiac source of embolism was identified, but cannot be ruled out on the basis of this examination.    Microbiology: Recent Results (from the past 240 hour(s))  Surgical pcr screen     Status: None   Collection Time: 10/22/14  9:18 PM  Result Value Ref Range Status   MRSA, PCR NEGATIVE NEGATIVE Final   Staphylococcus aureus NEGATIVE NEGATIVE Final    Comment:        The Xpert SA Assay (FDA approved for NASAL specimens in patients over 30 years of age), is one component of a comprehensive surveillance program.  Test performance has been validated by [facility] for patients greater than or equal to 57 year old. It is not intended to diagnose infection nor to guide or monitor treatment.      Labs: Basic Metabolic Panel:  Recent Labs Lab 10/23/14 0536 10/24/14 0350 10/25/14 1044 10/26/14 0438 10/27/14 0524  NA 133* 133* 133* 134* 135  K 3.6 4.5 4.1 4.0 3.7  CL 102 105 104 105  104  CO2 GLUCOSE 95 113* 119* 104* 102*  BUN CREATININE 1.24 1.28* 1.27*  1.30* 1.19  CALCIUM 8.8* 8.2* 8.5* 8.2* 8.3*   Liver Function Tests: No results for input(s): AST, ALT, ALKPHOS, BILITOT, PROT, ALBUMIN in the last 168 hours. No results for input(s): LIPASE, AMYLASE in the last 168 hours. No results for input(s): AMMONIA in the last 168 hours. CBC:  Recent Labs Lab 10/22/14 0617  10/24/14 0350 10/25/14 1044 10/26/14 0438 10/27/14 0524 10/28/14 0530  WBC 2.9*  < > 5.3 5.0 4.0 3.5* 3.8*  NEUTROABS 0.9*  --   --   --   --   --   --   HGB 12.6*  < > 12.9* 12.4* 11.3* 11.0* 11.0*  HCT 37.1*  < > 38.3* 36.1* 32.3* 31.4* 31.9*  MCV 85.3  < > 85.1 84.9 83.0 84.6 84.8  PLT 119*  < > 124* 113* 90* 115* 118*  < > = values in this interval not displayed. Cardiac Enzymes: No results for input(s): CKTOTAL, CKMB, CKMBINDEX, TROPONINI in the last 168 hours. BNP: BNP (last 3 results) No results for input(s): BNP in the last 8760 hours.  ProBNP (last 3 results) No results for input(s): PROBNP in the last 8760 hours.  CBG:  Recent Labs Lab 10/23/14 0858 10/27/14 0037  GLUCAP 79 103*    hgb a1c 5.9   Signed:  SULLIVAN,CORINNA L  [facility] 10/28/2014, 10:29 AM

## 2014-10-28 NOTE — Discharge Instructions (Signed)
Foley Catheter Care A Foley catheter is a soft, flexible tube. This tube is placed into your bladder to drain pee (urine). If you go home with this catheter in place, follow the instructions below. TAKING CARE OF THE CATHETER 1. Wash your hands with soap and water. 2. Put soap and water on a clean washcloth.  Clean the skin where the tube goes into your body.  Clean away from the tube site.  Never wipe toward the tube.  Clean the area using a circular motion.  Remove all the soap. Pat the area dry with a clean towel. For males, reposition the skin that covers the end of the penis (foreskin). 3. Attach the tube to your leg with tape or a leg strap. Do not stretch the tube tight. If you are using tape, remove any stickiness left behind by past tape you used. 4. Keep the drainage bag below your hips. Keep it off the floor. 5. Check your tube during the day. Make sure it is working and draining. Make sure the tube does not curl, twist, or bend. 6. Do not pull on the tube or try to take it out. TAKING CARE OF THE DRAINAGE BAGS You will have a large overnight drainage bag and a small leg bag. You may wear the overnight bag any time. Never wear the small bag at night. Follow the directions below. Emptying the Drainage Bag Empty your drainage bag when it is  - full or at least 2-3 times a day. 1. Wash your hands with soap and water. 2. Keep the drainage bag below your hips. 3. Hold the dirty bag over the toilet or clean container. 4. Open the pour spout at the bottom of the bag. Empty the pee into the toilet or container. Do not let the pour spout touch anything. 5. Clean the pour spout with a gauze pad or cotton ball that has rubbing alcohol on it. 6. Close the pour spout. 7. Attach the bag to your leg with tape or a leg strap. 8. Wash your hands well. Changing the Drainage Bag Change your bag once a month or sooner if it starts to smell or look dirty.  1. Wash your hands with soap and  water. 2. Pinch the rubber tube so that pee does not spill out. 3. Disconnect the catheter tube from the drainage tube at the connection valve. Do not let the tubes touch anything. 4. Clean the end of the catheter tube with an alcohol wipe. Clean the end of a the drainage tube with a different alcohol wipe. 5. Connect the catheter tube to the drainage tube of the clean drainage bag. 6. Attach the new bag to the leg with tape or a leg strap. Avoid attaching the new bag too tightly. 7. Wash your hands well. Cleaning the Drainage Bag 1. Wash your hands with soap and water. 2. Wash the bag in warm, soapy water. 3. Rinse the bag with warm water. 4. Fill the bag with a mixture of white vinegar and water (1 cup vinegar to 1 quart warm water [.2 liter vinegar to 1 liter warm water]). Close the bag and soak it for 30 minutes in the solution. 5. Rinse the bag with warm water. 6. Hang the bag to dry with the pour spout open and hanging downward. 7. Store the clean bag (once it is dry) in a clean plastic bag. 8. Wash your hands well. PREVENT INFECTION  Wash your hands before and after touching your tube.  Take showers every day. Wash the skin where the tube enters your body. Do not take baths. Replace wet leg straps with dry ones, if this applies.  Do not use powders, sprays, or lotions on the genital area. Only use creams, lotions, or ointments as told by your doctor.  For females, wipe from front to back after going to the bathroom.  Drink enough fluids to keep your pee clear or pale yellow unless you are told not to have too much fluid (fluid restriction).  Do not let the drainage bag or tubing touch or lie on the floor.  Wear cotton underwear to keep the area dry. GET HELP IF:  Your pee is cloudy or smells unusually bad.  Your tube becomes clogged.  You are not draining pee into the bag or your bladder feels full.  Your tube starts to leak. GET HELP RIGHT AWAY IF:  You have pain,  puffiness (swelling), redness, or yellowish-white fluid (pus) where the tube enters the body.  You have pain in the belly (abdomen), legs, lower back, or bladder.  You have a fever.  You see blood fill the tube, or your pee is pink or red.  You feel sick to your stomach (nauseous), throw up (vomit), or have chills.  Your tube gets pulled out. MAKE SURE YOU:   Understand these instructions.  Will watch your condition.  Will get help right away if you are not doing well or get worse. Document Released: 05/17/2012 Document Revised: 06/06/2013 Document Reviewed: 05/17/2012 [facility] Patient Information 2015 [place], [place]. This information is not intended to replace advice given to you by your health care provider. Make sure you discuss any questions you have with your health care provider. Transient Ischemic Attack A transient ischemic attack (TIA) is a "warning stroke" that causes stroke-like symptoms. Unlike a stroke, a TIA does not cause permanent damage to the brain. The symptoms of a TIA can happen very fast and do not last long. It is important to know the symptoms of a TIA and what to do. This can help prevent a major stroke or death. CAUSES  7. A TIA is caused by a temporary blockage in an artery in the brain or neck (carotid artery). The blockage does not allow the brain to get the blood supply it needs and can cause different symptoms. The blockage can be caused by either:  A blood clot.  Fatty buildup (plaque) in a neck or brain artery. RISK FACTORS 9. High blood pressure (hypertension). 10. High cholesterol. 11. Diabetes mellitus. 12. Heart disease. 13. The build up of plaque in the blood vessels (peripheral artery disease or atherosclerosis). 14. The build up of plaque in the blood vessels providing blood and oxygen to the brain (carotid artery stenosis). 15. An abnormal heart rhythm (atrial fibrillation). 16. Obesity. 17. Smoking. 18. Taking oral contraceptives  (especially in combination with smoking). 19. Physical inactivity. 20. A diet high in fats, salt (sodium), and calories. 21. Alcohol use. 22. Use of illegal drugs (especially cocaine and methamphetamine). 75. Being male. 24. Being African American. 25. Being over the age of 4. 37. Family history of stroke. 27. Previous history of blood clots, stroke, TIA, or heart attack. 28. Sickle cell disease. SYMPTOMS  TIA symptoms are the same as a stroke but are temporary. These symptoms usually develop suddenly, or may be newly present upon awakening from sleep: 8. Sudden weakness or numbness of the face, arm, or leg, especially on one side of the body. 9. Sudden  trouble walking or difficulty moving arms or legs. 10. Sudden confusion. 11. Sudden personality changes. 12. Trouble speaking (aphasia) or understanding. 13. Difficulty swallowing. 14. Sudden trouble seeing in one or both eyes. 15. Double vision. 16. Dizziness. 17. Loss of balance or coordination. 18. Sudden severe headache with no known cause. 19. Trouble reading or writing. 20. Loss of bowel or bladder control. 21. Loss of consciousness. DIAGNOSIS  Your caregiver may be able to determine the presence or absence of a TIA based on your symptoms, history, and physical exam. Computed tomography (CT scan) of the brain is usually performed to help identify a TIA. Other tests may be done to diagnose a TIA. These tests may include: 9. Electrocardiography. 10. Continuous heart monitoring. 11. Echocardiography. 12. Carotid ultrasonography. 13. Magnetic resonance imaging (MRI). 14. A scan of the brain circulation. 15. Blood tests. PREVENTION  The risk of a TIA can be decreased by appropriately treating high blood pressure, high cholesterol, diabetes, heart disease, and obesity and by quitting smoking, limiting alcohol, and staying physically active. TREATMENT  Time is of the essence. Since the symptoms of TIA are the same as a stroke,  it is important to seek treatment as soon as possible because you may need a medicine to dissolve the clot (thrombolytic) that cannot be given if too much time has passed. Treatment options vary. Treatment options may include rest, oxygen, intravenous (IV) fluids, and medicines to thin the blood (anticoagulants). Medicines and diet may be used to address diabetes, high blood pressure, and other risk factors. Measures will be taken to prevent short-term and long-term complications, including infection from breathing foreign material into the lungs (aspiration pneumonia), blood clots in the legs, and falls. Treatment options include procedures to either remove plaque in the carotid arteries or dilate carotid arteries that have narrowed due to plaque. Those procedures are:  Carotid endarterectomy.  Carotid angioplasty and stenting. HOME CARE INSTRUCTIONS   Take all medicines prescribed by your caregiver. Follow the directions carefully. Medicines may be used to control risk factors for a stroke. Be sure you understand all your medicine instructions.  You may be told to take aspirin or the anticoagulant warfarin. Warfarin needs to be taken exactly as instructed.  Taking too much or too little warfarin is dangerous. Too much warfarin increases the risk of bleeding. Too little warfarin continues to allow the risk for blood clots. While taking warfarin, you will need to have regular blood tests to measure your blood clotting time. A PT blood test measures how long it takes for blood to clot. Your PT is used to calculate another value called an INR. Your PT and INR help your caregiver to adjust your dose of warfarin. The dose can change for many reasons. It is critically important that you take warfarin exactly as prescribed.  Many foods, especially foods high in vitamin K can interfere with warfarin and affect the PT and INR. Foods high in vitamin K include spinach, kale, broccoli, cabbage, collard and turnip  greens, brussels sprouts, peas, cauliflower, seaweed, and parsley as well as beef and pork liver, green tea, and soybean oil. You should eat a consistent amount of foods high in vitamin K. Avoid major changes in your diet, or notify your caregiver before changing your diet. Arrange a visit with a dietitian to answer your questions.  Many medicines can interfere with warfarin and affect the PT and INR. You must tell your caregiver about any and all medicines you take, this includes all vitamins and  supplements. Be especially cautious with aspirin and anti-inflammatory medicines. Do not take or discontinue any prescribed or over-the-counter medicine except on the advice of your caregiver or pharmacist.  Warfarin can have side effects, such as excessive bruising or bleeding. You will need to hold pressure over cuts for longer than usual. Your caregiver or pharmacist will discuss other potential side effects.  Avoid sports or activities that may cause injury or bleeding.  Be mindful when shaving, flossing your teeth, or handling sharp objects.  Alcohol can change the body's ability to handle warfarin. It is best to avoid alcoholic drinks or consume only very small amounts while taking warfarin. Notify your caregiver if you change your alcohol intake.  Notify your dentist or other caregivers before procedures.  Eat a diet that includes 5 or more servings of fruits and vegetables each day. This may reduce the risk of stroke. Certain diets may be prescribed to address high blood pressure, high cholesterol, diabetes, or obesity.  A low-sodium, low-saturated fat, low-trans fat, low-cholesterol diet is recommended to manage high blood pressure.  A low-saturated fat, low-trans fat, low-cholesterol, and high-fiber diet may control cholesterol levels.  A controlled-carbohydrate, controlled-sugar diet is recommended to manage diabetes.  A reduced-calorie, low-sodium, low-saturated fat, low-trans fat,  low-cholesterol diet is recommended to manage obesity.  Maintain a healthy weight.  Stay physically active. It is recommended that you get at least 30 minutes of activity on most or all days.  Do not smoke.  Limit alcohol use even if you are not taking warfarin. Moderate alcohol use is considered to be:  No more than 2 drinks each day for men.  No more than 1 drink each day for nonpregnant women.  Stop drug abuse.  Home safety. A safe home environment is important to reduce the risk of falls. Your caregiver may arrange for specialists to evaluate your home. Having grab bars in the bedroom and bathroom is often important. Your caregiver may arrange for equipment to be used at home, such as raised toilets and a seat for the shower.  Follow all instructions for follow-up with your caregiver. This is very important. This includes any referrals and lab tests. Proper follow up can prevent a stroke or another TIA from occurring. SEEK MEDICAL CARE IF:  You have personality changes.  You have difficulty swallowing.  You are seeing double.  You have dizziness.  You have a fever.  You have skin breakdown. SEEK IMMEDIATE MEDICAL CARE IF:  Any of these symptoms may represent a serious problem that is an emergency. Do not wait to see if the symptoms will go away. Get medical help right away. Call your local emergency services (911 in [place] Do not drive yourself to the hospital.  You have sudden weakness or numbness of the face, arm, or leg, especially on one side of the body.  You have sudden trouble walking or difficulty moving arms or legs.  You have sudden confusion.  You have trouble speaking (aphasia) or understanding.  You have sudden trouble seeing in one or both eyes.  You have a loss of balance or coordination.  You have a sudden, severe headache with no known cause.  You have new chest pain or an irregular heartbeat.  You have a partial or total loss of  consciousness. MAKE SURE YOU:   Understand these instructions.  Will watch your condition.  Will get help right away if you are not doing well or get worse. Document Released: 10/30/2004 Document Revised: 01/25/2013 Document  Reviewed: 04/27/2013 [employer] Patient Information 2015 [place], [place]. This information is not intended to replace advice given to you by your health care provider. Make sure you discuss any questions you have with your health care provider.

## 2014-10-28 NOTE — Care Management Note (Signed)
Case Management Note  Patient Details  Name: Oscar Santiago MRN: [id number] Date of Birth: 23-Nov-1925  Subjective/Objective:                  left-sided weakness.  Action/Plan:  Discharge planning Expected Discharge Date:  10/28/14               Expected Discharge Plan:  [place]  In-House Referral:     Discharge planning Services  CM Consult  Post Acute Care Choice:  Home Health Choice offered to:  Patient  DME Arranged:  Walker rolling DME Agency:  [place] Arranged:  RN, PT, OT [facility] Agency:  [place]  Status of Service:  Completed, signed off  Medicare Important Message Given:  Yes-third notification given Date Medicare IM Given:    Medicare IM give by:    Date Additional Medicare IM Given:    Additional Medicare Important Message give by:     If discussed at [place] of Stay Meetings, dates discussed:    Additional Comments: CM met with pt in room who asked I call his granddaughter, [name] [phone] to arrange home health agency.  [name] chooses [facility] to render HHPT/OT/RN.  Referral called to [facility] rep, Stephanie.  CM called [facility] DME rep, Merry Proud to please deliver the rolling walker to room prior to discharge today.  No other CM needs were comunicated. Dellie Catholic, RN 10/28/2014, 11:23 AM

## 2014-10-28 NOTE — Progress Notes (Signed)
Pt discharged at 2305 via [facility] with  NT Ladona Ridgel and family. Prescriptions, discharge instructions and handouts given to granddaughter and patient. All belongings and walker sent home with patient.

## 2014-10-28 NOTE — Progress Notes (Addendum)
Occupational Therapy Treatment Patient Details Name: Oscar Santiago MRN: [id number] DOB: 1925-02-09 Today's Date: 10/28/2014    History of present illness  81 y.o. Male who went to put on his shirt and noted his left arm was week and then fell due to his left leg giving out. EMS was called to [facility] for a fall but on arrival noted he had left sided plegia and facial droop. He was also noted to have dysarthria. On arrival to ED his dysarthria resolved but continued to have left arm weakness but NOT plegia. He only had a left arm drift. S/P L CEA   OT comments   Education provided in session and gave pt handout for tub bench transfer. He is unsure if family is getting him this.   Follow Up Recommendations  Supervision/Assistance - 24 hour;Home health OT    Equipment Recommendations    Per previous notes, family will get tub bench on their own.   Recommendations for Other Services      Precautions / Restrictions Precautions Precautions: Fall Restrictions Weight Bearing Restrictions: No       Mobility Bed Mobility               General bed mobility comments: patient up in chair  Transfers Overall transfer level: Needs assistance Equipment used: Rolling walker (2 wheeled) Transfers: Sit to/from Stand Sit to Stand: Min guard              Balance                                   ADL Overall ADL's : Needs assistance/impaired     Grooming: Wash/dry face;Oral care;Supervision/safety;Standing Grooming Details (indicate cue type and reason): pt went and retrieved items and handed to OT                 Toilet Transfer: Supervision/safety;Ambulation;Min guard;RW (sit to stand from chair-Min guard)           Functional mobility during ADLs: Supervision/safety;Rolling walker General ADL Comments: Educated on tub transfer techniques and options for shower chair. Recommended someone be with him for tub transfer. Educated on use of bag on walker.   Explained that HH can go over tub transfer.       Vision                     Perception     Praxis      Cognition  Awake/Alert Behavior During Therapy: WFL for tasks assessed/performed Overall Cognitive Status: No family/caregiver present to determine baseline cognitive functioning                       Extremity/Trunk Assessment               Exercises     Shoulder Instructions       General Comments      Pertinent Vitals/ Pain       Pain Assessment: No/denies pain  Home Living                                          Prior Functioning/Environment              Frequency Min 2X/week     Progress Toward Goals  OT Goals(current goals can  now be found in the care plan section)  Progress towards OT goals: Progressing toward goals  Acute Rehab OT Goals Patient Stated Goal: go home and get back to his chores OT Goal Formulation: With patient Time For Goal Achievement: 10/29/14 Potential to Achieve Goals: Good ADL Goals Pt Will Perform Grooming: with modified independence;standing Pt Will Perform Tub/Shower Transfer: Tub transfer;with modified independence;rolling walker;3 in 1;ambulating  Plan Discharge plan remains appropriate    Co-evaluation                 End of Session Equipment Utilized During Treatment: Gait belt;Rolling walker   Activity Tolerance Patient tolerated treatment well   Patient Left in chair;with chair alarm set;Other (comment) (with case manager in room)   Nurse Communication          Time: [phone] OT Time Calculation (min): 14 min  Charges: OT General Charges $OT Visit: 1 Procedure OT Treatments $Self Care/Home Management : 8-22 mins  Earlie Raveling OTR/L [phone] 10/28/2014, 11:23 AM

## 2014-11-01 ENCOUNTER — Telehealth: Payer: Self-pay | Admitting: Neurology

## 2014-11-01 ENCOUNTER — Telehealth: Payer: Self-pay | Admitting: Internal Medicine

## 2014-11-01 NOTE — Telephone Encounter (Signed)
Pt's granddaughter called to schedule f/u post hospital appt. She also stated that lacosamide (VIMPAT) 50 MG TABS tablet was prescribed to the pt. However, they need prior auth on the pt. Pt is scheduled to come in end of Nov. Please call and advise [phone] Pt has not had any medication since Friday while in the hospital. Pt was d/cd on Saturday. She would also like to know if there are samples available. The hospital would not do the prior auth

## 2014-11-01 NOTE — Telephone Encounter (Signed)
Rn talk to Howard Pouch at [facility] contact person for [employer]. Riki Rusk stated Dr.Xu and Dr. Pearlean Brownie did see patient in hospital under stroke rounds. Pt was on medication vimpat prior to being hospitalized. Riki Rusk stated he talk to pts daughter Bobbie Stack also Dr Roda Shutters spoke to her. Riki Rusk stated patient was discharge by a hospitalitis. Riki Rusk stated Dr. Roda Shutters will be looking into the medication issue.

## 2014-11-01 NOTE — Telephone Encounter (Signed)
Pt's granddaughter following up on FMLA paperwork submitted the other day. Patient is wanting to be out of work starting Monday and did not know if it went to dr Artist Pais or dr Caryl Never. Please advise

## 2014-11-02 ENCOUNTER — Encounter: Payer: Medicare Other | Admitting: *Deleted

## 2014-11-02 ENCOUNTER — Other Ambulatory Visit: Payer: Self-pay | Admitting: Neurology

## 2014-11-02 ENCOUNTER — Telehealth: Payer: Self-pay | Admitting: Cardiology

## 2014-11-02 MED ORDER — LACOSAMIDE 50 MG PO TABS: 50.0000 mg | ORAL_TABLET | Freq: Two times a day (BID) | ORAL | Status: DC

## 2014-11-02 NOTE — Telephone Encounter (Signed)
LMOVM reminding pt to send remote transmission.   

## 2014-11-02 NOTE — Telephone Encounter (Signed)
Paperwork already picked up

## 2014-11-02 NOTE — Telephone Encounter (Signed)
Rn talk to patients granddaughter about patients vimpat medication. Patients daughter [name] stated that her grandfather never took the original dose of vimpat.

## 2014-11-02 NOTE — Telephone Encounter (Signed)
Rn call patients pharmacy and they stated the pts insurance needs to be call for prior authorization.

## 2014-11-02 NOTE — Telephone Encounter (Signed)
Rn call [employer] patients insurance. Rn was on the phone for a total of 30 min. Rn call three different numbers to get authorized. Rn was finally told to call [facility]. Rn call  [phone] and [phone]. Rn also was told [facility] does prior authorizations but its a 72 hr turn around with a fax or md calling to do it. Rn was on hold again, but person at the customer service never came back. Rn had to hang up due to no conclusion. Rn notified Megan NP and clinical supervisor of hold times and not able to get any authorization due to multiple transfer calls. Patient was discharge from hospital and they would not do a prior authorization for the patient per granddaughter vimpat. Dr Pearlean Brownie did another prescription for medication and sign it for Dr. Roda Shutters. Rn will get in contact with [facility] pharmacists for assistance with pts meds.

## 2014-11-03 ENCOUNTER — Encounter: Payer: Self-pay | Admitting: Cardiology

## 2014-11-03 ENCOUNTER — Encounter: Payer: Self-pay | Admitting: Vascular Surgery

## 2014-11-04 ENCOUNTER — Emergency Department (HOSPITAL_COMMUNITY): Payer: Medicare Other

## 2014-11-04 ENCOUNTER — Inpatient Hospital Stay (HOSPITAL_COMMUNITY)
Admission: EM | Admit: 2014-11-04 | Discharge: 2014-11-09 | DRG: 699 | Disposition: A | Discharge status: Home Health | Payer: Medicare Other | Attending: Internal Medicine | Admitting: Internal Medicine

## 2014-11-04 ENCOUNTER — Encounter (HOSPITAL_COMMUNITY): Payer: Self-pay | Admitting: Emergency Medicine

## 2014-11-04 DIAGNOSIS — T83511A Infection and inflammatory reaction due to indwelling urethral catheter, initial encounter: Secondary | ICD-10-CM | POA: Diagnosis present

## 2014-11-04 DIAGNOSIS — Z950 Presence of cardiac pacemaker: Secondary | ICD-10-CM

## 2014-11-04 DIAGNOSIS — F0390 Unspecified dementia without behavioral disturbance: Secondary | ICD-10-CM | POA: Diagnosis present

## 2014-11-04 DIAGNOSIS — E785 Hyperlipidemia, unspecified: Secondary | ICD-10-CM | POA: Diagnosis present

## 2014-11-04 DIAGNOSIS — H409 Unspecified glaucoma: Secondary | ICD-10-CM | POA: Diagnosis present

## 2014-11-04 DIAGNOSIS — Y846 Urinary catheterization as the cause of abnormal reaction of the patient, or of later complication, without mention of misadventure at the time of the procedure: Secondary | ICD-10-CM | POA: Diagnosis present

## 2014-11-04 DIAGNOSIS — Z7982 Long term (current) use of aspirin: Secondary | ICD-10-CM

## 2014-11-04 DIAGNOSIS — E869 Volume depletion, unspecified: Secondary | ICD-10-CM | POA: Diagnosis present

## 2014-11-04 DIAGNOSIS — N390 Urinary tract infection, site not specified: Secondary | ICD-10-CM | POA: Diagnosis present

## 2014-11-04 DIAGNOSIS — I482 Chronic atrial fibrillation, unspecified: Secondary | ICD-10-CM

## 2014-11-04 DIAGNOSIS — R339 Retention of urine, unspecified: Secondary | ICD-10-CM | POA: Diagnosis not present

## 2014-11-04 DIAGNOSIS — G40909 Epilepsy, unspecified, not intractable, without status epilepticus: Secondary | ICD-10-CM | POA: Diagnosis present

## 2014-11-04 DIAGNOSIS — H9190 Unspecified hearing loss, unspecified ear: Secondary | ICD-10-CM | POA: Diagnosis present

## 2014-11-04 DIAGNOSIS — M109 Gout, unspecified: Secondary | ICD-10-CM | POA: Diagnosis present

## 2014-11-04 DIAGNOSIS — N10 Acute tubulo-interstitial nephritis: Secondary | ICD-10-CM | POA: Diagnosis not present

## 2014-11-04 DIAGNOSIS — N183 Chronic kidney disease, stage 3 unspecified: Secondary | ICD-10-CM

## 2014-11-04 DIAGNOSIS — Z79899 Other long term (current) drug therapy: Secondary | ICD-10-CM

## 2014-11-04 DIAGNOSIS — R059 Cough, unspecified: Secondary | ICD-10-CM

## 2014-11-04 DIAGNOSIS — I340 Nonrheumatic mitral (valve) insufficiency: Secondary | ICD-10-CM | POA: Diagnosis present

## 2014-11-04 DIAGNOSIS — B961 Klebsiella pneumoniae [K. pneumoniae] as the cause of diseases classified elsewhere: Secondary | ICD-10-CM | POA: Diagnosis present

## 2014-11-04 DIAGNOSIS — I5022 Chronic systolic (congestive) heart failure: Secondary | ICD-10-CM | POA: Diagnosis present

## 2014-11-04 DIAGNOSIS — I519 Heart disease, unspecified: Secondary | ICD-10-CM | POA: Diagnosis present

## 2014-11-04 DIAGNOSIS — I130 Hypertensive heart and chronic kidney disease with heart failure and stage 1 through stage 4 chronic kidney disease, or unspecified chronic kidney disease: Secondary | ICD-10-CM | POA: Diagnosis present

## 2014-11-04 DIAGNOSIS — R627 Adult failure to thrive: Secondary | ICD-10-CM | POA: Diagnosis present

## 2014-11-04 DIAGNOSIS — E039 Hypothyroidism, unspecified: Secondary | ICD-10-CM | POA: Diagnosis present

## 2014-11-04 DIAGNOSIS — Z7902 Long term (current) use of antithrombotics/antiplatelets: Secondary | ICD-10-CM

## 2014-11-04 DIAGNOSIS — E871 Hypo-osmolality and hyponatremia: Secondary | ICD-10-CM | POA: Diagnosis present

## 2014-11-04 DIAGNOSIS — Z8673 Personal history of transient ischemic attack (TIA), and cerebral infarction without residual deficits: Secondary | ICD-10-CM

## 2014-11-04 DIAGNOSIS — Z823 Family history of stroke: Secondary | ICD-10-CM

## 2014-11-04 DIAGNOSIS — Z8249 Family history of ischemic heart disease and other diseases of the circulatory system: Secondary | ICD-10-CM

## 2014-11-04 DIAGNOSIS — Z87442 Personal history of urinary calculi: Secondary | ICD-10-CM | POA: Diagnosis not present

## 2014-11-04 DIAGNOSIS — R05 Cough: Secondary | ICD-10-CM

## 2014-11-04 DIAGNOSIS — I739 Peripheral vascular disease, unspecified: Secondary | ICD-10-CM | POA: Diagnosis present

## 2014-11-04 DIAGNOSIS — G4733 Obstructive sleep apnea (adult) (pediatric): Secondary | ICD-10-CM | POA: Diagnosis not present

## 2014-11-04 DIAGNOSIS — R509 Fever, unspecified: Secondary | ICD-10-CM

## 2014-11-04 DIAGNOSIS — I10 Essential (primary) hypertension: Secondary | ICD-10-CM | POA: Diagnosis present

## 2014-11-04 DIAGNOSIS — G459 Transient cerebral ischemic attack, unspecified: Secondary | ICD-10-CM

## 2014-11-04 DIAGNOSIS — K219 Gastro-esophageal reflux disease without esophagitis: Secondary | ICD-10-CM | POA: Diagnosis present

## 2014-11-04 LAB — CBC WITH DIFFERENTIAL/PLATELET
BASOS ABS: 0 10*3/uL (ref 0.0–0.1)
BASOS PCT: 0 %
EOS ABS: 0 10*3/uL (ref 0.0–0.7)
Eosinophils Relative: 0 %
HCT: 35.6 % — ABNORMAL LOW (ref 39.0–52.0)
HEMOGLOBIN: 12.1 g/dL — AB (ref 13.0–17.0)
LYMPHS ABS: 0.8 10*3/uL (ref 0.7–4.0)
Lymphocytes Relative: 8 %
MCH: 28.4 pg (ref 26.0–34.0)
MCHC: 34 g/dL (ref 30.0–36.0)
MCV: 83.6 fL (ref 78.0–100.0)
Monocytes Absolute: 0.6 10*3/uL (ref 0.1–1.0)
Monocytes Relative: 6 %
NEUTROS PCT: 86 %
Neutro Abs: 8.3 10*3/uL — ABNORMAL HIGH (ref 1.7–7.7)
Platelets: 205 10*3/uL (ref 150–400)
RBC: 4.26 MIL/uL (ref 4.22–5.81)
RDW: 14.7 % (ref 11.5–15.5)
WBC: 9.7 10*3/uL (ref 4.0–10.5)

## 2014-11-04 LAB — COMPREHENSIVE METABOLIC PANEL
ALT: 19 U/L (ref 17–63)
AST: 36 U/L (ref 15–41)
Albumin: 3.6 g/dL (ref 3.5–5.0)
Alkaline Phosphatase: 70 U/L (ref 38–126)
Anion gap: 11 (ref 5–15)
BUN: 13 mg/dL (ref 6–20)
CALCIUM: 8.6 mg/dL — AB (ref 8.9–10.3)
CHLORIDE: 103 mmol/L (ref 101–111)
CO2: 19 mmol/L — ABNORMAL LOW (ref 22–32)
CREATININE: 1.25 mg/dL — AB (ref 0.61–1.24)
GFR, EST AFRICAN AMERICAN: 58 mL/min — AB (ref >60)
GFR, EST NON AFRICAN AMERICAN: 50 mL/min — AB (ref >60)
Glucose, Bld: 111 mg/dL — ABNORMAL HIGH (ref 65–99)
Potassium: 3.8 mmol/L (ref 3.5–5.1)
Sodium: 133 mmol/L — ABNORMAL LOW (ref 135–145)
Total Bilirubin: 1.2 mg/dL (ref 0.3–1.2)
Total Protein: 7.3 g/dL (ref 6.5–8.1)

## 2014-11-04 LAB — URINALYSIS, ROUTINE W REFLEX MICROSCOPIC
Bilirubin Urine: NEGATIVE
Glucose, UA: NEGATIVE mg/dL
Ketones, ur: NEGATIVE mg/dL
NITRITE: NEGATIVE
PROTEIN: 30 mg/dL — AB
Specific Gravity, Urine: 1.012 (ref 1.005–1.030)
UROBILINOGEN UA: 1 mg/dL (ref 0.0–1.0)
pH: 6 (ref 5.0–8.0)

## 2014-11-04 LAB — I-STAT CG4 LACTIC ACID, ED
LACTIC ACID, VENOUS: 1.3 mmol/L (ref 0.5–2.0)
Lactic Acid, Venous: 0.94 mmol/L (ref 0.5–2.0)

## 2014-11-04 LAB — URINE MICROSCOPIC-ADD ON

## 2014-11-04 MED ORDER — LEVETIRACETAM 500 MG PO TABS
500.0000 mg | ORAL_TABLET | Freq: Every evening | ORAL | Status: DC
  Administered 2014-11-04 – 2014-11-07 (×4): 500 mg via ORAL
  Filled 2014-11-04 (×5): qty 1

## 2014-11-04 MED ORDER — TAMSULOSIN HCL 0.4 MG PO CAPS
0.4000 mg | ORAL_CAPSULE | Freq: Every day | ORAL | Status: DC
  Administered 2014-11-05 – 2014-11-08 (×4): 0.4 mg via ORAL
  Filled 2014-11-04 (×4): qty 1

## 2014-11-04 MED ORDER — LEVETIRACETAM 250 MG PO TABS
250.0000 mg | ORAL_TABLET | Freq: Every morning | ORAL | Status: DC
  Administered 2014-11-05 – 2014-11-09 (×5): 250 mg via ORAL
  Filled 2014-11-04 (×6): qty 1

## 2014-11-04 MED ORDER — ONDANSETRON HCL 4 MG/2ML IJ SOLN: 4.0000 mg | Freq: Four times a day (QID) | INTRAMUSCULAR | Status: DC | PRN

## 2014-11-04 MED ORDER — LATANOPROST 0.005 % OP SOLN
1.0000 [drp] | Freq: Every day | OPHTHALMIC | Status: DC
  Administered 2014-11-04 – 2014-11-09 (×5): 1 [drp] via OPHTHALMIC
  Filled 2014-11-04 (×2): qty 2.5

## 2014-11-04 MED ORDER — SODIUM CHLORIDE 0.9 % IV SOLN
INTRAVENOUS | Status: DC
  Administered 2014-11-04 – 2014-11-07 (×6): via INTRAVENOUS

## 2014-11-04 MED ORDER — PRAVASTATIN SODIUM 20 MG PO TABS
20.0000 mg | ORAL_TABLET | Freq: Every day | ORAL | Status: DC
  Administered 2014-11-05 – 2014-11-08 (×4): 20 mg via ORAL
  Filled 2014-11-04 (×5): qty 1

## 2014-11-04 MED ORDER — VITAMIN B-12 1000 MCG PO TABS
1000.0000 ug | ORAL_TABLET | Freq: Every day | ORAL | Status: DC
  Administered 2014-11-05 – 2014-11-09 (×5): 1000 ug via ORAL
  Filled 2014-11-04 (×7): qty 1

## 2014-11-04 MED ORDER — DEXTROSE 5 % IV SOLN
1.0000 g | INTRAVENOUS | Status: DC
  Administered 2014-11-05 – 2014-11-08 (×3): 1 g via INTRAVENOUS
  Filled 2014-11-04 (×5): qty 10

## 2014-11-04 MED ORDER — ZOLPIDEM TARTRATE 5 MG PO TABS
2.5000 mg | ORAL_TABLET | Freq: Every day | ORAL | Status: DC
  Administered 2014-11-04 – 2014-11-08 (×5): 2.5 mg via ORAL
  Filled 2014-11-04 (×5): qty 1

## 2014-11-04 MED ORDER — FAMOTIDINE 10 MG PO TABS
10.0000 mg | ORAL_TABLET | Freq: Every day | ORAL | Status: DC | PRN
  Administered 2014-11-05 – 2014-11-08 (×2): 10 mg via ORAL
  Filled 2014-11-04 (×4): qty 1

## 2014-11-04 MED ORDER — LEVOTHYROXINE SODIUM 25 MCG PO TABS
25.0000 ug | ORAL_TABLET | Freq: Every day | ORAL | Status: DC
  Administered 2014-11-06 – 2014-11-09 (×4): 25 ug via ORAL
  Filled 2014-11-04 (×7): qty 1

## 2014-11-04 MED ORDER — ASPIRIN 325 MG PO TABS
325.0000 mg | ORAL_TABLET | Freq: Every day | ORAL | Status: DC
  Administered 2014-11-05 – 2014-11-06 (×2): 325 mg via ORAL
  Filled 2014-11-04 (×4): qty 1

## 2014-11-04 MED ORDER — ONDANSETRON HCL 4 MG PO TABS: 4.0000 mg | ORAL_TABLET | Freq: Four times a day (QID) | ORAL | Status: DC | PRN

## 2014-11-04 MED ORDER — ACETAMINOPHEN 325 MG PO TABS
650.0000 mg | ORAL_TABLET | Freq: Once | ORAL | Status: AC
  Administered 2014-11-04: 650 mg via ORAL
  Filled 2014-11-04: qty 2

## 2014-11-04 MED ORDER — DEXTROSE 5 % IV SOLN
1.0000 g | Freq: Once | INTRAVENOUS | Status: AC
  Administered 2014-11-04: 1 g via INTRAVENOUS
  Filled 2014-11-04: qty 10

## 2014-11-04 MED ORDER — IRBESARTAN 300 MG PO TABS
300.0000 mg | ORAL_TABLET | Freq: Every day | ORAL | Status: DC
  Administered 2014-11-05 – 2014-11-09 (×5): 300 mg via ORAL
  Filled 2014-11-04 (×7): qty 1

## 2014-11-04 MED ORDER — LEVETIRACETAM 250 MG PO TABS: 250.0000 mg | ORAL_TABLET | Freq: Two times a day (BID) | ORAL | Status: DC

## 2014-11-04 MED ORDER — DORZOLAMIDE HCL-TIMOLOL MAL 2-0.5 % OP SOLN
1.0000 [drp] | Freq: Two times a day (BID) | OPHTHALMIC | Status: DC
  Administered 2014-11-04 – 2014-11-09 (×10): 1 [drp] via OPHTHALMIC
  Filled 2014-11-04 (×2): qty 10

## 2014-11-04 NOTE — Progress Notes (Signed)
NURSING PROGRESS NOTE  DERRICK TIEGS [id number] Admission Data: 11/04/2014 8:49 PM Attending Provider: Rolly Salter, MD WUJ:[id number] Artist Pais, DO Code Status: Full   GERASIMOS PLOTTS is a 81 y.o. male patient admitted from ED:  -No acute distress noted.  -No complaints of shortness of breath.  -No complaints of chest pain.   Cardiac Monitoring: N/A   Blood pressure 150/52, pulse 70, temperature 98.6 F (37 C), temperature source Oral, resp. rate 20, height 5' 10.8" (1.798 m), weight 84.7 kg (186 lb 11.7 oz), SpO2 96 %.   IV Fluids:  IV in place, occlusive dsg intact without redness, IV cath intact in Rt FA Allergies:  Coumadin and Lisinopril  Past Medical History:   has a past medical history of Hypertension; Seizure disorder (HCC); Glaucoma; Hyperlipidemia; Osteopenia; Compression fracture of spine (HCC); Gout; OSA (obstructive sleep apnea); Hypothyroid; Inguinal hernia; Hemorrhoid; Chronic systolic dysfunction of left ventricle; Major depression (HCC); History of GI bleed; MR (mitral regurgitation); Hearing loss; Dizziness; Kidney stone; CKD (chronic kidney disease), stage III; Permanent atrial fibrillation (HCC); Decreased appetite (03/02/12); Weight loss (03/02/12); Complete heart block (HCC); and CHF (congestive heart failure) (HCC).  Past Surgical History:   has past surgical history that includes Transurethral resection of prostate; Cardiac catheterization (2002); Pacemaker insertion (07/28/06); Inguinal hernia repair; Hemorrhoid surgery; Colonoscopy with propofol (N/A, 05/01/2014); and Endarterectomy (Right, 10/23/2014).  Social History:   reports that he has never smoked. He has never used smokeless tobacco. He reports that he does not drink alcohol or use illicit drugs.  Skin: Intact  Patient/Family orientated to room. Information packet given to patient/family. Admission inpatient armband information verified with patient/family to include name and date of birth and placed on patient  arm. Side rails up x 2, fall assessment and education completed with patient/family. Patient/family able to verbalize understanding of risk associated with falls and verbalized understanding to call for assistance before getting out of bed. Call light within reach. Patient/family able to voice and demonstrate understanding of unit orientation instructions.

## 2014-11-04 NOTE — ED Notes (Signed)
Admitting at bedside 

## 2014-11-04 NOTE — ED Provider Notes (Signed)
CSN: [id number]     Arrival date & time 11/04/14  1601 History   First MD Initiated Contact with Patient 11/04/14 1653     Chief Complaint  Patient presents with  . Nausea  . Vomiting  . Weakness  . Urinary Retention     (Consider location/radiation/quality/duration/timing/severity/associated sxs/prior Treatment) HPI  81 year old male presents with 3 episodes of vomiting and not feeling well since this morning. He recently had a Foley catheter taken out for urinary retention and did well on a voiding trial. Patient has thrown up 3 episodes of clear emesis. He has been urinating frequently and less than typical. States it does not hurt. The patient denies headache, chest pain, abdominal pain, or cough. No back pain. Patient has felt diffusely weak but no focal weakness. Patient is not confused according the family..  Past Medical History  Diagnosis Date  . Hypertension   . Seizure disorder (HCC)   . Glaucoma   . Hyperlipidemia   . Osteopenia   . Compression fracture of spine (HCC)     T 10 and T11  . Gout   . OSA (obstructive sleep apnea)     on CPAP therapy  . Hypothyroid   . Inguinal hernia   . Hemorrhoid   . Chronic systolic dysfunction of left ventricle     EF 30-35% by echo 10/10/2009  . Major depression (HCC)   . History of GI bleed     from coumadin  . MR (mitral regurgitation)   . Hearing loss   . Dizziness     intermittent  . Kidney stone   . CKD (chronic kidney disease), stage III   . Permanent atrial fibrillation (HCC)     refuses coumadin due to history of GI bleed  . Decreased appetite 03/02/12  . Weight loss 03/02/12  . Complete heart block (HCC)   . CHF (congestive heart failure) [facility])    Past Surgical History  Procedure Laterality Date  . Transurethral resection of prostate    . Cardiac catheterization  2002  . Pacemaker insertion  07/28/06    VVI pacemaker for complete heart block by Dr Amil Amen  . Inguinal hernia repair    . Hemorrhoid surgery    .  Colonoscopy with propofol N/A 05/01/2014    Procedure: COLONOSCOPY WITH PROPOFOL;  Surgeon: [name], MD;  Location: [facility] ENDOSCOPY;  Service: Endoscopy;  Laterality: N/A;  . Endarterectomy Right 10/23/2014    Procedure: ENDARTERECTOMY CAROTID;  Surgeon: Larina Earthly, MD;  Location: [facility] OR;  Service: Vascular;  Laterality: Right;   Family History  Problem Relation Age of Onset  . Heart failure Mother   . CVA Sister   . Aneurysm Brother     brain  . Colon cancer Neg Hx   . Colon polyps Neg Hx    Social History  Substance Use Topics  . Smoking status: Never Smoker   . Smokeless tobacco: Never Used  . Alcohol Use: No    Review of Systems  Constitutional: Positive for fever.  Respiratory: Negative for cough and shortness of breath.   Cardiovascular: Negative for chest pain.  Gastrointestinal: Positive for nausea and vomiting. Negative for abdominal pain and diarrhea.  Genitourinary: Positive for frequency and difficulty urinating. Negative for dysuria.  All other systems reviewed and are negative.     Allergies  Coumadin and Lisinopril  Home Medications   Prior to Admission medications   Medication Sig Start Date End Date Taking? Authorizing Provider  acetaminophen (TYLENOL)  500 MG tablet Take 500 mg by mouth every 6 (six) hours as needed (pain).    Historical Provider, MD  alendronate (FOSAMAX) 70 MG tablet Take 70 mg by mouth every Monday.  02/05/14   Historical Provider, MD  amLODipine (NORVASC) 10 MG tablet Take 5 mg by mouth daily as needed (diastolic blood pressure over 90).     Historical Provider, MD  apixaban (ELIQUIS) 5 MG TABS tablet Take 1 tablet (5 mg total) by mouth 2 (two) times daily. Start in one week if no further blood in urine 10/28/14   Christiane Ha, MD  aspirin 325 MG tablet Take 1 tablet (325 mg total) by mouth daily. Stop aspirin once eliquis started 10/28/14   Christiane Ha, MD  cetirizine (ZYRTEC) 10 MG tablet Take 10 mg by mouth daily.     Historical Provider, MD  colchicine 0.6 MG tablet TAKE 1 TABLET TWICE DAILY Patient taking differently: TAKE 1 TABLET TWICE DAILY AS NEEDED FOR GOUT 10/05/14   Doe-Hyun R Artist Pais, DO  Dextromethorphan-Guaifenesin (CORICIDIN HBP CONGESTION/COUGH) 10-200 MG CAPS Take 1 capsule by mouth daily as needed (for cold).     Historical Provider, MD  dorzolamide-timolol (COSOPT) 22.3-6.8 MG/ML ophthalmic solution Place 1 drop into both eyes 2 (two) times daily. 09/09/14   Historical Provider, MD  famotidine (PEPCID AC) 10 MG chewable tablet Chew 10 mg by mouth daily as needed for heartburn.  07/18/04   Historical Provider, MD  fluticasone (FLONASE) 50 MCG/ACT nasal spray Place 2 sprays into both nostrils daily as needed for allergies or rhinitis. \    Historical Provider, MD  furosemide (LASIX) 20 MG tablet Take 1 tablet (20 mg total) by mouth daily. 04/07/14   Lyn Records, MD  lacosamide (VIMPAT) 50 MG TABS tablet Take 1 tablet (50 mg total) by mouth 2 (two) times daily. 11/02/14   Micki Riley, MD  latanoprost (XALATAN) 0.005 % ophthalmic solution Place 1 drop into both eyes at bedtime.  02/05/14   Historical Provider, MD  levETIRAcetam (KEPPRA) 500 MG tablet Take 250-500 mg by mouth 2 (two) times daily. Take 1/2 tablet (250 mg) by mouth every morning and 1 tablet (500 mg) every night    Historical Provider, MD  levothyroxine (SYNTHROID, LEVOTHROID) 25 MCG tablet TAKE 1 TABLET (25 MCG TOTAL) BY MOUTH DAILY BEFORE BREAKFAST. 07/24/14   Doe-Hyun Sherran Needs, DO  Misc Natural Products (BLACK CHERRY CONCENTRATE PO) Take 1 capsule by mouth daily.    Historical Provider, MD  Multiple Vitamin (MULTIVITAMIN WITH MINERALS) TABS tablet Take 1 tablet by mouth daily.    Historical Provider, MD  pravastatin (PRAVACHOL) 20 MG tablet Take 1 tablet (20 mg total) by mouth daily at 6 PM. 10/28/14   Christiane Ha, MD  tamsulosin (FLOMAX) 0.4 MG CAPS capsule Take 1 capsule (0.4 mg total) by mouth daily after breakfast. 10/28/14   Christiane Ha, MD  valsartan (DIOVAN) 320 MG tablet Take 1 tablet (320 mg total) by mouth daily with lunch. 04/07/14   Lyn Records, MD  vitamin B-12 (CYANOCOBALAMIN) 1000 MCG tablet Take 1,000 mcg by mouth daily.    Historical Provider, MD  zolpidem (AMBIEN) 5 MG tablet TAKE 1 TABLET AT BEDTIME Patient taking differently: TAKE 1/2 TABLET BY MOUTH AT BEDTIME 08/25/14   Doe-Hyun R Yoo, DO   BP 150/55 mmHg  Pulse 63  Temp(Src) 101 F (38.3 C)  Resp 18  Ht  (1.753 m)  Wt 179 lb (81.194 kg)  BMI 26.42 kg/m2  SpO2 98% Physical Exam  Constitutional: He is oriented to person, place, and time. He appears well-developed and well-nourished. No distress.  HENT:  Head: Normocephalic and atraumatic.  Right Ear: External ear normal.  Left Ear: External ear normal.  Nose: Nose normal.  Eyes: Right eye exhibits no discharge. Left eye exhibits no discharge.  Neck: Neck supple.  Cardiovascular: Normal rate, regular rhythm, normal heart sounds and intact distal pulses.   Pulmonary/Chest: Effort normal and breath sounds normal.  Abdominal: Soft. There is tenderness in the suprapubic area.  Musculoskeletal: He exhibits no edema.  Neurological: He is alert and oriented to person, place, and time.  Skin: Skin is warm and dry. He is not diaphoretic.  Nursing note and vitals reviewed.   ED Course  Procedures (including critical care time) Labs Review Labs Reviewed  COMPREHENSIVE METABOLIC PANEL - Abnormal; Notable for the following:    Sodium 133 (*)    CO2 19 (*)    Glucose, Bld 111 (*)    Creatinine, Ser 1.25 (*)    Calcium 8.6 (*)    GFR calc non Af Amer 50 (*)    GFR calc Af Amer 58 (*)    All other components within normal limits  CBC WITH DIFFERENTIAL/PLATELET - Abnormal; Notable for the following:    Hemoglobin 12.1 (*)    HCT 35.6 (*)    Neutro Abs 8.3 (*)    All other components within normal limits  URINALYSIS, ROUTINE W REFLEX MICROSCOPIC (NOT AT [facility]) - Abnormal; Notable for the  following:    APPearance TURBID (*)    Hgb urine dipstick LARGE (*)    Protein, ur 30 (*)    Leukocytes, UA LARGE (*)    All other components within normal limits  URINE MICROSCOPIC-ADD ON - Abnormal; Notable for the following:    Bacteria, UA MANY (*)    All other components within normal limits  CULTURE, BLOOD (ROUTINE X 2)  CULTURE, BLOOD (ROUTINE X 2)  URINE CULTURE  BASIC METABOLIC PANEL  CBC  I-STAT CG4 LACTIC ACID, ED  I-STAT CG4 LACTIC ACID, ED    Imaging Review Dg Chest Port 1 View  11/04/2014   CLINICAL DATA:  Fever.  EXAM: PORTABLE CHEST 1 VIEW  COMPARISON:  10/20/2014 chest radiograph  FINDINGS: Single lead left subclavian pacemaker is stable in configuration. Stable cardiomediastinal silhouette with mild cardiomegaly. No pneumothorax. No pleural effusion. Mild curvilinear opacity at the left costophrenic angle, favor atelectasis. No pulmonary edema. No additional focal lung opacity.  IMPRESSION: Mild curvilinear opacity at the left costophrenic angle, favor atelectasis.  Stable mild cardiomegaly without pulmonary edema.   Electronically Signed   By: Delbert Phenix M.D.   On: 11/04/2014 18:23   I have personally reviewed and evaluated these images and lab results as part of my medical decision-making.   EKG Interpretation   Date/Time:  Saturday November 04 2014 17:13:58 EDT Ventricular Rate:  88 PR Interval:    QRS Duration: 141 QT Interval:  479 QTC Calculation: 580 R Axis:   -71 Text Interpretation:  Atrial fibrillation Paired ventricular premature  complexes Left bundle branch block no significant change since Sept 2016  Confirmed by Criss Alvine  MD, Rosabel Sermeno ([id number]) on 11/04/2014 5:24:14 PM      MDM   UTI  Patient has a UTI which is likely causing him to have vomiting and fever. Patient with significant comorbidities. Blood pressure is stable, however will need IV antibiotics and admission to the  hospital for further care. Dr. Annabell Howells of urology came to the ER to place  a Foley given the trouble he had last time in the hospital.    Pricilla Loveless, MD 11/04/14 2352

## 2014-11-04 NOTE — Consult Note (Signed)
Subjective: I was asked to see Oscar Santiago in consultation for retention.   Oscar Santiago was in the hospital in August for a TIA and had a traumatic foley placement with hematuria.  Oscar Santiago followed up in our office yesterday for a voiding trial and was actually voiding well until this morning when Oscar Santiago had progressive difficulty.  His PVR in the ER is and because of his prior history I was asked to place the foley.  Oscar Santiago is a patient of Dr Brunilda Payor and has had a prior TURP.  Oscar Santiago has had a fever to 101 today.  The UA shows hematuria with TNTC RBC's but only 3-6 WBC.   ROS:  Review of Systems  Constitutional: Positive for fever.  Respiratory: Negative for shortness of breath.   Cardiovascular: Negative for chest pain.  Gastrointestinal: Positive for nausea and vomiting. Negative for abdominal pain.  Genitourinary:       Able to void only tiny amounts.   All other systems reviewed and are negative.   Allergies  Allergen Reactions  . Coumadin [Warfarin Sodium] Other (See Comments)    GI Bleed   . Lisinopril Cough    Past Medical History  Diagnosis Date  . Hypertension   . Seizure disorder (HCC)   . Glaucoma   . Hyperlipidemia   . Osteopenia   . Compression fracture of spine (HCC)     T 10 and T11  . Gout   . OSA (obstructive sleep apnea)     on CPAP therapy  . Hypothyroid   . Inguinal hernia   . Hemorrhoid   . Chronic systolic dysfunction of left ventricle     EF 30-35% by echo 10/10/2009  . Major depression (HCC)   . History of GI bleed     from coumadin  . MR (mitral regurgitation)   . Hearing loss   . Dizziness     intermittent  . Kidney stone   . CKD (chronic kidney disease), stage III   . Permanent atrial fibrillation (HCC)     refuses coumadin due to history of GI bleed  . Decreased appetite 03/02/12  . Weight loss 03/02/12  . Complete heart block (HCC)   . CHF (congestive heart failure) [facility])     Past Surgical History  Procedure Laterality Date  . Transurethral  resection of prostate    . Cardiac catheterization  2002  . Pacemaker insertion  07/28/06    VVI pacemaker for complete heart block by Dr Amil Amen  . Inguinal hernia repair    . Hemorrhoid surgery    . Colonoscopy with propofol N/A 05/01/2014    Procedure: COLONOSCOPY WITH PROPOFOL;  Surgeon: [name], MD;  Location: [facility] ENDOSCOPY;  Service: Endoscopy;  Laterality: N/A;  . Endarterectomy Right 10/23/2014    Procedure: ENDARTERECTOMY CAROTID;  Surgeon: Larina Earthly, MD;  Location: [facility] OR;  Service: Vascular;  Laterality: Right;    Social History   Social History  . Marital Status: Widowed    Spouse Name: N/A  . Number of Children: 6  . Years of Education: N/A   Occupational History  . Retired     Education officer, environmental   Social History Main Topics  . Smoking status: Never Smoker   . Smokeless tobacco: Never Used  . Alcohol Use: No  . Drug Use: No  . Sexual Activity: Not on file   Other Topics Concern  . 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 Katrinka Blazing - Cardiology   Dr. Leone Payor - Gastroenterology   Dr. Brunilda Payor - Urology   [facility]    Family History  Problem Relation Age of Onset  . Heart failure Mother   . CVA Sister   . Aneurysm Brother     brain  . Colon cancer Neg Hx   . Colon polyps Neg Hx     Anti-infectives: Anti-infectives    None      No current facility-administered medications for this encounter.   Current Outpatient Prescriptions  Medication Sig Dispense Refill  . acetaminophen (TYLENOL) 500 MG tablet Take 500-1,000 mg by mouth every 6 (six) hours as needed (pain).     Marland Kitchen alendronate (FOSAMAX) 70 MG tablet Take 70 mg by mouth every Monday.     Marland Kitchen amLODipine (NORVASC) 10 MG tablet Take 5 mg by mouth daily as needed (diastolic blood pressure over 90).     Marland Kitchen aspirin 325 MG tablet Take 1 tablet (325 mg total) by  mouth daily. Stop aspirin once eliquis started    . cetirizine (ZYRTEC) 10 MG tablet Take 10 mg by mouth daily.    . colchicine 0.6 MG tablet TAKE 1 TABLET TWICE DAILY (Patient taking differently: TAKE 1 TABLET TWICE DAILY AS NEEDED FOR GOUT) 30 tablet 4  . Dextromethorphan-Guaifenesin (CORICIDIN HBP CONGESTION/COUGH) 10-200 MG CAPS Take 1 capsule by mouth daily as needed (for cold).     . dorzolamide-timolol (COSOPT) 22.3-6.8 MG/ML ophthalmic solution Place 1 drop into both eyes 2 (two) times daily.  6  . famotidine (PEPCID AC) 10 MG chewable tablet Chew 10 mg by mouth daily as needed for heartburn.     . fluticasone (FLONASE) 50 MCG/ACT nasal spray Place 2 sprays into both nostrils daily as needed for allergies or rhinitis. \    . furosemide (LASIX) 20 MG tablet Take 1 tablet (20 mg total) by mouth daily. 30 tablet 11  . latanoprost (XALATAN) 0.005 % ophthalmic solution Place 1 drop into both eyes at bedtime.     . levETIRAcetam (KEPPRA) 500 MG tablet Take 250-500 mg by mouth 2 (two) times daily. Take 1/2 tablet (250 mg) by mouth every morning and 1 tablet (500 mg) every night    . levothyroxine (SYNTHROID, LEVOTHROID) 25 MCG tablet TAKE 1 TABLET (25 MCG TOTAL) BY MOUTH DAILY BEFORE BREAKFAST. 90 tablet 1  . Misc Natural Products (BLACK CHERRY CONCENTRATE PO) Take 1 capsule by mouth daily.    . Multiple Vitamin (MULTIVITAMIN WITH MINERALS) TABS tablet Take 1 tablet by mouth daily.    . pravastatin (PRAVACHOL) 20 MG tablet Take 1 tablet (20 mg total) by mouth daily at 6 PM. 30 tablet 1  . tamsulosin (FLOMAX) 0.4 MG CAPS capsule Take 1 capsule (0.4 mg total) by mouth daily after breakfast. 30 capsule 1  . valsartan (DIOVAN) 320 MG tablet Take 1 tablet (320 mg total) by mouth daily with lunch. 30 tablet 11  . vitamin B-12 (CYANOCOBALAMIN) 1000 MCG tablet Take 1,000 mcg by mouth daily.    Marland Kitchen zolpidem (AMBIEN) 5 MG tablet TAKE 1 TABLET AT BEDTIME (Patient taking differently: TAKE 1/2 TABLET BY MOUTH AT  BEDTIME) 30 tablet 2  . apixaban (ELIQUIS) 5 MG TABS tablet Take 1 tablet (5 mg total) by mouth 2 (two) times daily. Start in one week if  no further blood in urine 60 tablet 1  . lacosamide (VIMPAT) 50 MG TABS tablet Take 1 tablet (50 mg total) by mouth 2 (two) times daily. 60 tablet 1   PSFHx reviewed.   Objective: Vital signs in last 24 hours: Temp:  [101 F (38.3 C)] 101 F (38.3 C) (10/01 1615) Pulse Rate:  [44-73] 73 (10/01 1800) Resp:  [18-22] 22 (10/01 1800) BP: (147-161)/(55-59) 147/57 mmHg (10/01 1800) SpO2:  [98 %] 98 % (10/01 1800) Weight:  [81.194 kg (179 lb)] 81.194 kg (179 lb) (10/01 1615)  Intake/Output from previous day:   Intake/Output this shift: Total I/O In: -  Out: 80 [Urine:80]   Physical Exam  Constitutional: Oscar Santiago is oriented to person, place, and time and well-developed, well-nourished, and in no distress.  Cardiovascular: Normal rate and regular rhythm.   Pulmonary/Chest: Effort normal. No respiratory distress.  Abdominal: Soft. Oscar Santiago exhibits no mass. There is no tenderness.  Genitourinary:  Uncircumcised phallus with an adequate meatus.  Scrotum and contents normal.   Musculoskeletal: Normal range of motion. Oscar Santiago exhibits no edema or tenderness.  Neurological: Oscar Santiago is alert and oriented to person, place, and time.  Skin: Skin is warm and dry.  Psychiatric: Mood and affect normal.    Lab Results:   Recent Labs  11/04/14 1710  WBC 9.7  HGB 12.1*  HCT 35.6*  PLT 205   BMET  Recent Labs  11/04/14 1710  NA 133*  K 3.8  CL 103  CO2 19*  GLUCOSE 111*  BUN 13  CREATININE 1.25*  CALCIUM 8.6*   PT/INR No results for input(s): LABPROT, INR in the last 72 hours. ABG No results for input(s): PHART, HCO3 in the last 72 hours.  Invalid input(s): PCO2, PO2  Studies/Results: No results found.  I have reviewed his recent hospital records, UA and labs.   Procedure:  I placed an 1fr coude foley to bedside bag drainage using aseptic technique  and urethral lubrication.  There was prompt return of a few small thin clots but otherwise light yellow, slightly cloudy urine.   A 62fr straight foley would not pass.     Assessment: Recurrent retention with complicated foley placement possibly from prior TURP.  Fever with possible UTI but UA had mainly hematuria.   Oscar Santiago will need foley drainage for another couple of weeks and should f/u in our office in about 2 weeks. If Oscar Santiago is admitted, I will follow.    CC: Dr. Pricilla Loveless.      Malayah Demuro J 11/04/2014 [id number]

## 2014-11-04 NOTE — H&P (Signed)
History and Physical  Oscar Santiago  UJW:[id number]  DOB: July 22, 1925  DOA: 11/04/2014  Referring physician: Dr. Criss Santiago PCP: Oscar Lemons, DO   Chief Complaint: "He was throwing up today and had a fever."  HPI: Oscar Santiago is a 81 y.o. male with a past medical history significant for epilepsy, hypertension, hypothyroidism, OSA on CPAP, chronic systolic CHF (EF 56%), chronic A. fib not on anticoagulation, heart block with pacer, CKD stage III, BPH and recent TIA who presents with a febrile UTI.  The patient was recently admitted from 9/16-9/24 for a TIA (left hemiparesis, resolved). In that hospitalization he underwent right CEA. He also had a seizure during that hospitalization for which he was started on Vimpat. Lastly in that hospitalization he had urinary retention and so coud catheter was placed by urology, and left until discharge. He had intermittent hematuria after placement of the catheter, for which reason Eliquis was held at discharge.  Yesterday he saw urology, and the catheter was removed as expected and he was able to void last night. Unfortunately this morning he woke up with malaise, recurrent vomiting, and developed a fever today. His family brought him to the emergency room where urinalysis showed numerous white blood cells, fever to 101F, and tachycardia.  In the ED, he was started on ceftriaxone. Dr. Annabell Santiago was consulted in the ER and placed a coud catheter again, with request to keep this in for 2 weeks. The patient was admitted to [facility] for febrile UTI.   Review of Systems:  Patient seen 7:45 PM on 11/04/2014. Pt complains of chills, fever, fatigue, emesis, hematuria today. Pt denies any cough, abdominal pain.  Twelve systems were reviewed and were negative except as just noted or noted in the history of present illness.  Past Medical History  Diagnosis Date  . Hypertension   . Seizure disorder (HCC)   . Glaucoma   . Hyperlipidemia   . Osteopenia   . Compression  fracture of spine (HCC)     T 10 and T11  . Gout   . OSA (obstructive sleep apnea)     on CPAP therapy  . Hypothyroid   . Inguinal hernia   . Hemorrhoid   . Chronic systolic dysfunction of left ventricle     EF 30-35% by echo 10/10/2009  . Major depression (HCC)   . History of GI bleed     from coumadin  . MR (mitral regurgitation)   . Hearing loss   . Dizziness     intermittent  . Kidney stone   . CKD (chronic kidney disease), stage III   . Permanent atrial fibrillation (HCC)     refuses coumadin due to history of GI bleed  . Decreased appetite 03/02/12  . Weight loss 03/02/12  . Complete heart block (HCC)   . CHF (congestive heart failure) (HCC)   The above past medical history was reviewed.  Past Surgical History  Procedure Laterality Date  . Transurethral resection of prostate    . Cardiac catheterization  2002  . Pacemaker insertion  07/28/06    VVI pacemaker for complete heart block by Dr Oscar Santiago  . Inguinal hernia repair    . Hemorrhoid surgery    . Colonoscopy with propofol N/A 05/01/2014    Procedure: COLONOSCOPY WITH PROPOFOL;  Surgeon: Oscar Boop, MD;  Location: [facility];  Service: Endoscopy;  Laterality: N/A;  . Endarterectomy Right 10/23/2014    Procedure: ENDARTERECTOMY CAROTID;  Surgeon: Oscar Earthly, MD;  Location: [facility]  OR;  Service: Vascular;  Laterality: Right;  The above surgical history was reviewed.  Social History: Patient lives at home where he was discharged after his last hospitalization. His daughter has been staying with him since discharge. He is a nonsmoker. Before his stroke he was independent with ADLs.   Allergies  Allergen Reactions  . Coumadin [Warfarin Sodium] Other (See Comments)    GI Bleed   . Lisinopril Cough    Family History  Problem Relation Age of Onset  . Heart failure Mother   . CVA Sister   . Aneurysm Brother     brain  . CVA Brother   . Colon cancer Neg Hx   . Colon polyps Neg Hx   . Transient ischemic attack  Daughter     Prior to Admission medications   Medication Sig Start Date End Date Taking? Authorizing Provider  acetaminophen (TYLENOL) 500 MG tablet Take 500-1,000 mg by mouth every 6 (six) hours as needed (pain).    Yes Historical Provider, MD  alendronate (FOSAMAX) 70 MG tablet Take 70 mg by mouth every Monday.  02/05/14  Yes Historical Provider, MD  amLODipine (NORVASC) 10 MG tablet Take 5 mg by mouth daily as needed (diastolic blood pressure over 90).    Yes Historical Provider, MD  aspirin 325 MG tablet Take 1 tablet (325 mg total) by mouth daily. Stop aspirin once eliquis started 10/28/14  Yes Oscar Ha, MD  cetirizine (ZYRTEC) 10 MG tablet Take 10 mg by mouth daily.   Yes Historical Provider, MD  colchicine 0.6 MG tablet TAKE 1 TABLET TWICE DAILY Patient taking differently: TAKE 1 TABLET TWICE DAILY AS NEEDED FOR GOUT 10/05/14  Yes Oscar Oscar Needs, DO  Dextromethorphan-Guaifenesin (CORICIDIN HBP CONGESTION/COUGH) 10-200 MG CAPS Take 1 capsule by mouth daily as needed (for cold).    Yes Historical Provider, MD  dorzolamide-timolol (COSOPT) 22.3-6.8 MG/ML ophthalmic solution Place 1 drop into both eyes 2 (two) times daily. 09/09/14  Yes Historical Provider, MD  famotidine (PEPCID AC) 10 MG chewable tablet Chew 10 mg by mouth daily as needed for heartburn.  07/18/04  Yes Historical Provider, MD  fluticasone (FLONASE) 50 MCG/ACT nasal spray Place 2 sprays into both nostrils daily as needed for allergies or rhinitis. \   Yes Historical Provider, MD  furosemide (LASIX) 20 MG tablet Take 1 tablet (20 mg total) by mouth daily. 04/07/14  Yes Oscar Records, MD  latanoprost (XALATAN) 0.005 % ophthalmic solution Place 1 drop into both eyes at bedtime.  02/05/14  Yes Historical Provider, MD  levETIRAcetam (KEPPRA) 500 MG tablet Take 250-500 mg by mouth 2 (two) times daily. Take 1/2 tablet (250 mg) by mouth every morning and 1 tablet (500 mg) every night   Yes Historical Provider, MD  levothyroxine  (SYNTHROID, LEVOTHROID) 25 MCG tablet TAKE 1 TABLET (25 MCG TOTAL) BY MOUTH DAILY BEFORE BREAKFAST. 07/24/14  Yes Oscar Oscar Needs, DO  Misc Natural Products (BLACK CHERRY CONCENTRATE PO) Take 1 capsule by mouth daily.   Yes Historical Provider, MD  Multiple Vitamin (MULTIVITAMIN WITH MINERALS) TABS tablet Take 1 tablet by mouth daily.   Yes Historical Provider, MD  pravastatin (PRAVACHOL) 20 MG tablet Take 1 tablet (20 mg total) by mouth daily at 6 PM. 10/28/14  Yes Oscar Ha, MD  tamsulosin (FLOMAX) 0.4 MG CAPS capsule Take 1 capsule (0.4 mg total) by mouth daily after breakfast. 10/28/14  Yes Oscar Ha, MD  valsartan (DIOVAN) 320 MG tablet Take 1 tablet (  320 mg total) by mouth daily with lunch. 04/07/14  Yes Oscar Records, MD  vitamin B-12 (CYANOCOBALAMIN) 1000 MCG tablet Take 1,000 mcg by mouth daily.   Yes Historical Provider, MD  zolpidem (AMBIEN) 5 MG tablet TAKE 1 TABLET AT BEDTIME Patient taking differently: TAKE 1/2 TABLET BY MOUTH AT BEDTIME 08/25/14  Yes Oscar R Artist Pais, DO  apixaban (ELIQUIS) 5 MG TABS tablet Take 1 tablet (5 mg total) by mouth 2 (two) times daily. Start in one week if no further blood in urine 10/28/14   Oscar Ha, MD  lacosamide (VIMPAT) 50 MG TABS tablet Take 1 tablet (50 mg total) by mouth 2 (two) times daily. 11/02/14   Micki Riley, MD    Physical Exam: BP 131/46 mmHg  Pulse 61  Temp(Src) 101 F (38.3 C)  Resp 20  Ht 5\' 9"  (1.753 m)  Wt 81.194 kg (179 lb)  BMI 26.42 kg/m2  SpO2 97% General: Elderly male, sitting with a sheet over his head, in no distress.  Responds appropriately to questions.   HEENT: Head normal.   Ears: Hard of hearing. Eyes: Sclerae normal without icterus, conjunctiva pink, lids and lashes normal.  PERRL and EOMI.   Nose: No deformity, discharge, or epistaxis.   Mouth: OP moist without erythema, exudates, cobblestoning, or ulcers.  No airway deformities.   Neck: Supple. No thyromegaly or masses.  Healing right CEA  scar. Skin: Warm and dry.  No jaundice.  No suspicious rashes or lesions. Cardiac: Irregularly irregular, nl S1-S2, no murmurs appreciated.  Capillary refill is less than 2 seconds.   No LE edema.   Respiratory: Normal respiratory rate and rhythm.  CTAB without rales or wheezes. Abdomen: BS present.  Abdomen soft without rigidity.   no TTP or rebound all quadrants. No ascites, distension.   Neuro: Sensorium intact, but tired appearing.  Cranial nerves 3-12 intact.  Speech is fluent.  Attention and concentration are normal.  Moves all extremities equally and with normal coordination and 5/5 strenght.    Psych: Appropriate affect.  Speech normal. Thought content/process linear/appropriate.  No evidence of aural or visual hallucinations or delusions.       Labs on Admission:  The metabolic panel is notable for Mild hyponatremia, normal potassium, bicarbonate. The serum creatinine is 1.25 mg/dL which is close to his baseline of 1.2-1.3. There is no transaminitis and normal bilirubin. The lactic acid level is normal.  The complete blood count is notable fornormal WBC, mild normocytic anemia, normal platelets. The urinalysis shows large leukocytes, and many bacteria.    Radiological Exams on Admission: Personally reviewed: Dg Chest Port 1 View 11/04/2014 Clear.    EKG: Independently reviewed. Atrial fibrillation with LBBB and rate 80s.    Assessment/Plan Principal Problem:   UTI (urinary tract infection) Active Problems:   Chronic atrial fibrillation (CHADVASc = 5)   Hypertension   Hyperlipidemia   OSA (obstructive sleep apnea)   Cardiac pacemaker in situ   Chronic systolic dysfunction of left ventricle   CKD (chronic kidney disease), stage III   Seizure disorder   TIA (transient ischemic attack)   Fever   UTI (lower urinary tract infection)  1. Urinary tract infection and early sepsis the patient presents with infected appearing urine shortly after discontinuing his Foley  catheter. Blood cultures are pending but I think that urine is the likely source of his infection. He was recently instrumented and hospitalized, but he received no antibiotic last month and so I suspect his risk  for drug resistant organisms is low enough that ceftriaxone is a reasonable choice. -Continue ceftriaxone 1 g daily -Follow-up urine cultures -Follow up blood cultures -Normal saline 75 mL an hour -Continue Flomax -Consult to urology, appreciate recommendations   2. Atrial fibrillation with pacemaker:  The patient is not on rate control agent at baseline, and he is currently rate controlled. He is not on an anticoagulant at baseline because of a previous GI bleed and his concerns about anticoagulation. After his TIA, Eliquis was recommended, but was being held because of hematuria, with the expectation that this would be started within the next few weeks.  3. Hypertension:  This is within goal at admission. -Continue ARB  4. OSA:  -Continue home CPAP  5. CKD stage III:  This is at baseline. -Continue ARB  6. Seizures:  The patient had a seizure during his last hospitalization. His daughter reports to me that he's been having partial seizures frequently over the last several months to years. He was restarted on [facility] O; well in the hospital, but this has not gotten preauthorization yet, and so he wasn't taking it at home, and I have not restarted it here in the hospital -Continue home Keppra  7. Recent TIA and s/p CEA:  No complaints of focal weakness, face symmetric. -Continue aspirin 325 daily, continue statin  8. GERD:  -Continue home H2 RA  9. Hyponatremia: Close to baseline. Likely related to acute infection. -Rehydrate -Trend BMP    DVT PPx: SCDs Diet: Heart Healthy Consultants: Urology Code Status: Full Family Communication: The patient's diagnosis, workup and treatments were discussed with the family at the bedside (daughter and brother).  Expectations for  catheter were discussed.  Code status was clarified.   Disposition Plan:  The appropriate admission status for this patient is INPATIENT. Inpatient status is judged to be reasonable and necessary in order to provide the required intensity of service to ensure the patient's safety. The patient's presenting symptoms, physical exam findings, and initial radiographic and laboratory data in the context of their chronic comorbidities is felt to place them at high risk for further clinical deterioration. Furthermore, it is not anticipated that the patient will be medically stable for discharge from the hospital within 2 midnights of admission. The following factors support the admission status of inpatient.   A. The patient's presenting symptoms include fever, emesis, fatigue/weakness. B. The worrisome physical exam findings include tachycardia, fever, weakness. C. The initial radiographic and laboratory data are worrisome because of urinalysis. D. The chronic co-morbidities include cerebrovascular disease, PVD, seizures, CKD, atrial fibrillation, old age, BPH. E. Patient requires inpatient status due to high intensity of service, high risk for further deterioration and high frequency of surveillance required. F. I certify that at the point of admission it is my clinical judgment that the patient will require inpatient hospital care spanning beyond 2 midnights from the point of admission.    Oscar Santiago [facility] Pager [phone]

## 2014-11-04 NOTE — Procedures (Signed)
Pt is resting comfortably on room air and refuses the use of CPAP.  His granddaughter states that she will bring in the pt's home machine tomorrow in cases he wants to wear his own machine.

## 2014-11-04 NOTE — ED Notes (Signed)
Portable at bedside 

## 2014-11-04 NOTE — ED Notes (Signed)
Family reports that pt was at urologist yesterday and had his foley removed. Pt only urinating about 100 mL of urine at a time. Pt with N/V and weakness onset when pt woke up about 0400 today.

## 2014-11-04 NOTE — Progress Notes (Signed)
Received report from [facility] regarding Pt in room 5W 26.  Lesly Dukes, RN

## 2014-11-05 DIAGNOSIS — R339 Retention of urine, unspecified: Secondary | ICD-10-CM | POA: Diagnosis present

## 2014-11-05 LAB — BASIC METABOLIC PANEL
Anion gap: 7 (ref 5–15)
BUN: 14 mg/dL (ref 6–20)
CALCIUM: 7.9 mg/dL — AB (ref 8.9–10.3)
CO2: 21 mmol/L — ABNORMAL LOW (ref 22–32)
CREATININE: 1.29 mg/dL — AB (ref 0.61–1.24)
Chloride: 102 mmol/L (ref 101–111)
GFR calc Af Amer: 56 mL/min — ABNORMAL LOW (ref >60)
GFR, EST NON AFRICAN AMERICAN: 48 mL/min — AB (ref >60)
GLUCOSE: 131 mg/dL — AB (ref 65–99)
Potassium: 3.8 mmol/L (ref 3.5–5.1)
Sodium: 130 mmol/L — ABNORMAL LOW (ref 135–145)

## 2014-11-05 LAB — CBC
HEMATOCRIT: 31 % — AB (ref 39.0–52.0)
Hemoglobin: 10.4 g/dL — ABNORMAL LOW (ref 13.0–17.0)
MCH: 28.6 pg (ref 26.0–34.0)
MCHC: 33.5 g/dL (ref 30.0–36.0)
MCV: 85.2 fL (ref 78.0–100.0)
PLATELETS: 162 10*3/uL (ref 150–400)
RBC: 3.64 MIL/uL — ABNORMAL LOW (ref 4.22–5.81)
RDW: 15.2 % (ref 11.5–15.5)
WBC: 11.3 10*3/uL — ABNORMAL HIGH (ref 4.0–10.5)

## 2014-11-05 LAB — OSMOLALITY, URINE: OSMOLALITY UR: 644 mosm/kg (ref 390–1090)

## 2014-11-05 LAB — OSMOLALITY: Osmolality: 279 mOsm/kg (ref 275–300)

## 2014-11-05 MED ORDER — ENOXAPARIN SODIUM 40 MG/0.4ML ~~LOC~~ SOLN
40.0000 mg | SUBCUTANEOUS | Status: DC
  Administered 2014-11-05 – 2014-11-06 (×2): 40 mg via SUBCUTANEOUS
  Filled 2014-11-05 (×2): qty 0.4

## 2014-11-05 MED ORDER — ENSURE ENLIVE PO LIQD
237.0000 mL | Freq: Two times a day (BID) | ORAL | Status: DC
  Administered 2014-11-05 – 2014-11-06 (×4): 237 mL via ORAL

## 2014-11-05 MED ORDER — FLUTICASONE PROPIONATE 50 MCG/ACT NA SUSP
1.0000 | Freq: Every day | NASAL | Status: DC
  Administered 2014-11-06 – 2014-11-09 (×4): 1 via NASAL
  Filled 2014-11-05 (×3): qty 16

## 2014-11-05 MED ORDER — LEVOTHYROXINE SODIUM 25 MCG PO TABS
25.0000 ug | ORAL_TABLET | ORAL | Status: AC
  Administered 2014-11-05: 25 ug via ORAL

## 2014-11-05 NOTE — Assessment & Plan Note (Signed)
He is tolerating the foley well.  The urine is cloudy.  He is being treated for a UTI and had a low grade fever this am.  He will need to go home with the foley and f/u in our office in 1-2 weeks for reevaluation.

## 2014-11-05 NOTE — Progress Notes (Signed)
Patient ID: Oscar Santiago, male   DOB: [birth year], 81 y.o.   MRN: [id number]    Subjective: Oscar Santiago had recurrent retention and a febrile UTI.   An 9fr coude was placed yesterday.   He has a low grade fever this morning but is otherwise without complaints.  His WBC is up some as is his Cr.  Cultures are pending.  ROS:  Review of Systems  Constitutional: Positive for fever. Negative for chills.  Respiratory: Negative for cough and shortness of breath.   Gastrointestinal: Negative for abdominal pain.    Anti-infectives: Anti-infectives    Start     Dose/Rate Route Frequency Ordered Stop   11/05/14 2000  cefTRIAXone (ROCEPHIN) 1 g in dextrose 5 % 50 mL IVPB     1 g 100 mL/hr over 30 Minutes Intravenous Every 24 hours 11/04/14 2025     11/04/14 1900  cefTRIAXone (ROCEPHIN) 1 g in dextrose 5 % 50 mL IVPB     1 g 100 mL/hr over 30 Minutes Intravenous  Once 11/04/14 1855 11/04/14 2016      Current Facility-Administered Medications  Medication Dose Route Frequency Provider Last Rate Last Dose  . 0.9 %  sodium chloride infusion   Intravenous Continuous Alberteen Sam, MD 75 mL/hr at 11/05/14 1044    . aspirin tablet 325 mg  325 mg Oral Daily Alberteen Sam, MD   325 mg at 11/05/14 1003  . cefTRIAXone (ROCEPHIN) 1 g in dextrose 5 % 50 mL IVPB  1 g Intravenous Q24H Alberteen Sam, MD      . dorzolamide-timolol (COSOPT) 22.3-6.8 MG/ML ophthalmic solution 1 drop  1 drop Both Eyes BID Alberteen Sam, MD   1 drop at 11/05/14 1003  . famotidine (PEPCID) tablet 10 mg  10 mg Oral Daily PRN Alberteen Sam, MD      . feeding supplement (ENSURE ENLIVE) (ENSURE ENLIVE) liquid 237 mL  237 mL Oral BID BM Starleen Arms, MD   237 mL at 11/05/14 0958  . fluticasone (FLONASE) 50 MCG/ACT nasal spray 1 spray  1 spray Each Nare Daily Dawood S Elgergawy, MD      . irbesartan (AVAPRO) tablet 300 mg  300 mg Oral Daily Alberteen Sam, MD   300 mg at 11/05/14 0959  .  latanoprost (XALATAN) 0.005 % ophthalmic solution 1 drop  1 drop Both Eyes QHS Alberteen Sam, MD   1 drop at 11/04/14 2152  . levETIRAcetam (KEPPRA) tablet 250 mg  250 mg Oral q morning - 10a Rolly Salter, MD   250 mg at 11/05/14 1002  . levETIRAcetam (KEPPRA) tablet 500 mg  500 mg Oral QPM Rolly Salter, MD   500 mg at 11/04/14 2148  . levothyroxine (SYNTHROID, LEVOTHROID) tablet 25 mcg  25 mcg Oral QAC breakfast Alberteen Sam, MD   Stopped at 11/04/14 2045  . ondansetron (ZOFRAN) tablet 4 mg  4 mg Oral Q6H PRN Alberteen Sam, MD       Or  . ondansetron (ZOFRAN) injection 4 mg  4 mg Intravenous Q6H PRN Alberteen Sam, MD      . pravastatin (PRAVACHOL) tablet 20 mg  20 mg Oral q1800 Alberteen Sam, MD   20 mg at 11/04/14 2130  . tamsulosin (FLOMAX) capsule 0.4 mg  0.4 mg Oral QPC breakfast Alberteen Sam, MD   0.4 mg at 11/05/14 0842  . vitamin B-12 (CYANOCOBALAMIN) tablet 1,000 mcg  1,000 mcg Oral  Daily Alberteen Sam, MD   Stopped at 11/04/14 2030  . zolpidem (AMBIEN) tablet 2.5 mg  2.5 mg Oral QHS Alberteen Sam, MD   2.5 mg at 11/04/14 2146     Objective: Vital signs in last 24 hours: Temp:  [98.6 F (37 C)-101 F (38.3 C)] 100.1 F (37.8 C) (10/02 0541) Pulse Rate:  [44-73] 66 (10/02 0541) Resp:  [18-22] 20 (10/02 0541) BP: (131-165)/(46-59) 143/58 mmHg (10/02 0541) SpO2:  [95 %-99 %] 95 % (10/02 0541) Weight:  [81.194 kg (179 lb)-84.7 kg (186 lb 11.7 oz)] 84.7 kg (186 lb 11.7 oz) (10/01 2023)  Intake/Output from previous day: 10/01 0701 - 10/02 0700 In: 830 [P.O.:150; I.V.:680] Out: 780 [Urine:780] Intake/Output this shift: Total I/O In: 240 [P.O.:240] Out: -    Physical Exam  Constitutional: He is well-developed, well-nourished, and in no distress.  Genitourinary:  Urine is cloudy but foley is draining well.     Lab Results:   Recent Labs  11/04/14 1710 11/05/14 0445  WBC 9.7 11.3*  HGB 12.1* 10.4*   HCT 35.6* 31.0*  PLT 205 162   BMET  Recent Labs  11/04/14 1710 11/05/14 0445  NA 133* 130*  K 3.8 3.8  CL 103 102  CO2 19* 21*  GLUCOSE 111* 131*  BUN 13 14  CREATININE 1.25* 1.29*  CALCIUM 8.6* 7.9*   PT/INR No results for input(s): LABPROT, INR in the last 72 hours. ABG No results for input(s): PHART, HCO3 in the last 72 hours.  Invalid input(s): PCO2, PO2  Studies/Results: Dg Chest Port 1 View  11/04/2014   CLINICAL DATA:  Fever.  EXAM: PORTABLE CHEST 1 VIEW  COMPARISON:  10/20/2014 chest radiograph  FINDINGS: Single lead left subclavian pacemaker is stable in configuration. Stable cardiomediastinal silhouette with mild cardiomegaly. No pneumothorax. No pleural effusion. Mild curvilinear opacity at the left costophrenic angle, favor atelectasis. No pulmonary edema. No additional focal lung opacity.  IMPRESSION: Mild curvilinear opacity at the left costophrenic angle, favor atelectasis.  Stable mild cardiomegaly without pulmonary edema.   Electronically Signed   By: Delbert Phenix M.D.   On: 11/04/2014 18:23   Labs reviewed.   Assessment and Plan: Retention of urine He is tolerating the foley well.  The urine is cloudy.  He is being treated for a UTI and had a low grade fever this am.  He will need to go home with the foley and f/u in our office in 1-2 weeks for reevaluation.          LOS: 1 day    Makaelyn Aponte J 11/05/2014 [phone]

## 2014-11-05 NOTE — Procedures (Signed)
Pt does not wish to wear cpap machine, RT will continue to monitor.

## 2014-11-05 NOTE — Progress Notes (Signed)
Patient Demographics  Oscar Santiago, is a 81 y.o. male, DOB - January 07, 1926, ZOX:[id number]  Admit date - 11/04/2014   Admitting Physician Rolly Salter, MD  Outpatient Primary MD for the patient is [name], DO  LOS - 1   Chief Complaint  Patient presents with  . Nausea  . Vomiting  . Weakness  . Urinary Retention         Subjective:   [name] today has, No headache, No chest pain, No abdominal pain - No Nausea,No Cough - SOB, still complaining of generalized weakness.  Assessment & Plan    Principal Problem:   UTI (urinary tract infection) Active Problems:   Chronic atrial fibrillation (CHADVASc = 5)   Hypertension   Hyperlipidemia   OSA (obstructive sleep apnea)   Cardiac pacemaker in situ   Chronic systolic dysfunction of left ventricle   CKD (chronic kidney disease), stage III   Seizure disorder (HCC)   TIA (transient ischemic attack)   Fever   UTI (lower urinary tract infection)   Retention of urine  Urinary tract infection  -Continue ceftriaxone 1 g daily -Follow-up urine cultures -Follow up blood cultures   Urinary retention - Continue with Flomax, urology consult appreciated, coude catheter inserted yesterday by urology, follow up in 1-2 weeks as an outpatient with urology.   Atrial fibrillation with pacemaker:  The patient is not on rate control agent at baseline, and he is currently rate controlled. He is not on an anticoagulant at baseline because of a previous GI bleed and his concerns about anticoagulation. After his TIA, Eliquis was recommended, but was being held because of hematuria, with the expectation that this would be started within the next few weeks.   Hypertension:  This is within goal at admission. -Continue ARB  OSA:  -Continue home CPAP  CKD stage III:  This is at baseline. -Continue ARB   Seizures -Continue home Keppra  Recent  TIA and s/p CEA:  No complaints of focal weakness, face symmetric. -Continue aspirin 325 daily, continue statin   GERD:  -Continue home H2 RA   Hyponatremia: - Most likely related to volume depletion, continue to monitor on IV MS, will check serum and urine osmolality.  Code Status: Full  Family Communication:  granddaughter at bedside  Disposition Plan: Pending further workup   Procedures  None   Consults   urology   Medications  Scheduled Meds: . aspirin  325 mg Oral Daily  . cefTRIAXone (ROCEPHIN)  IV  1 g Intravenous Q24H  . dorzolamide-timolol  1 drop Both Eyes BID  . feeding supplement (ENSURE ENLIVE)  237 mL Oral BID BM  . fluticasone  1 spray Each Nare Daily  . irbesartan  300 mg Oral Daily  . latanoprost  1 drop Both Eyes QHS  . levETIRAcetam  250 mg Oral q morning - 10a  . levETIRAcetam  500 mg Oral QPM  . levothyroxine  25 mcg Oral QAC breakfast  . pravastatin  20 mg Oral q1800  . tamsulosin  0.4 mg Oral QPC breakfast  . vitamin B-12  1,000 mcg Oral Daily  . zolpidem  2.5 mg Oral QHS   Continuous Infusions: . sodium chloride 75 mL/hr at 11/05/14 1044   PRN  Meds:.famotidine, ondansetron **OR** ondansetron (ZOFRAN) IV  DVT Prophylaxis  Lovenox -  Lab Results  Component Value Date   PLT 162 11/05/2014    Antibiotics    Anti-infectives    Start     Dose/Rate Route Frequency Ordered Stop   11/05/14 2000  cefTRIAXone (ROCEPHIN) 1 g in dextrose 5 % 50 mL IVPB     1 g 100 mL/hr over 30 Minutes Intravenous Every 24 hours 11/04/14 2025     11/04/14 1900  cefTRIAXone (ROCEPHIN) 1 g in dextrose 5 % 50 mL IVPB     1 g 100 mL/hr over 30 Minutes Intravenous  Once 11/04/14 1855 11/04/14 2016          Objective:   Filed Vitals:   11/04/14 1915 11/04/14 1930 11/04/14 2023 11/05/14 0541  BP: 131/46 165/55 150/52 143/58  Pulse: 61 72 70 66  Temp:   98.6 F (37 C) 100.1 F (37.8 C)  TempSrc:   Oral Oral  Resp: 20 20 20 20   Height:   5' 10.8"  (1.798 m)   Weight:   84.7 kg (186 lb 11.7 oz)   SpO2: 97% 99% 96% 95%    Wt Readings from Last 3 Encounters:  11/04/14 84.7 kg (186 lb 11.7 oz)  10/28/14 88.7 kg (195 lb 8.8 oz)  10/13/14 84.823 kg (187 lb)     Intake/Output Summary (Last 24 hours) at 11/05/14 1219 Last data filed at 11/05/14 0900  Gross per 24 hour  Intake   1070 ml  Output    780 ml  Net    290 ml     Physical Exam  Awake Alert, Supple Neck,No JVD,  Symmetrical Chest wall movement, Good air movement bilaterally,  RRR,No Gallops,Rubs or new Murmurs, No Parasternal Heave +ve B.Sounds, Abd Soft, No tenderness, No organomegaly appriciated,  No Cyanosis, Clubbing or edema, No new Rash or bruise     Data Review   Micro Results Recent Results (from the past 240 hour(s))  Urine culture     Status: None (Preliminary result)   Collection Time: 11/04/14  4:42 PM  Result Value Ref Range Status   Specimen Description URINE, CLEAN CATCH  Final   Special Requests NONE  Final   Culture TOO YOUNG TO READ  Final   Report Status PENDING  Incomplete    Radiology Reports Ct Angio Head W/cm &/or Wo Cm  10/20/2014   CLINICAL DATA:  Gait disturbance. Slurred speech. Seizures. Atrial fibrillation. Symptoms began today.  EXAM: CT ANGIOGRAPHY HEAD AND NECK  TECHNIQUE: Multidetector CT imaging of the head and neck was performed using the standard protocol during bolus administration of intravenous contrast. Multiplanar CT image reconstructions and MIPs were obtained to evaluate the vascular anatomy. Carotid stenosis measurements (when applicable) are obtained utilizing NASCET criteria, using the distal internal carotid diameter as the denominator.  CONTRAST:  50mL OMNIPAQUE IOHEXOL 350 MG/ML SOLN  COMPARISON:  CT earlier same day.  FINDINGS: CT HEAD  Brain shows generalized atrophy with mild chronic small-vessel changes of the white matter. No sign of acute infarction, mass lesion, hemorrhage, hydrocephalus or extra-axial  collection.  CTA NECK  Aortic arch: Mild atherosclerosis of the arch without aneurysm or dissection. Branching pattern of the brachiocephalic vessels from the arch is normal without origin stenosis.  Right carotid system: Common carotid artery widely patent to the bifurcation. There is advanced atherosclerotic disease affecting the proximal internal carotid artery. Minimal diameter in the bulb is 1.5 mm. Compared to a more  distal cervical ICA diameter of 5 mm, this indicates 70% stenosis.  Left carotid system: Common carotid artery widely patent to the bifurcation region. Complex atherosclerotic disease of the ICA bulb with minimal diameter of 1 mm. Compared to a more distal cervical ICA diameter of 5 mm, this indicates an 80% stenosis.  Vertebral arteries:The left vertebral artery is dominant. There is 20% stenosis at the origin. The vessel is widely patent beyond that. The nondominant right vertebral artery is widely patent at its origin and through the cervical region.  Skeleton: Ordinary spondylosis  Other neck: No significant soft tissue lesion no upper chest lesion.  CTA HEAD  Anterior circulation: Internal carotid arteries are widely patent through the skullbase. There is atherosclerotic calcification in the siphon regions but no stenosis greater than 30-40%. Supra clinoid internal carotid arteries are widely patent. The anterior and middle cerebral vessels are patent without proximal stenosis, aneurysm or vascular malformation. No missing branches are appreciated.  Posterior circulation: Both vertebral arteries are widely patent through the foramen magnum to the basilar. No basilar stenosis. Posterior circulation branch vessels are patent.  Venous sinuses: Patent and normal  Anatomic variants: None significant  IMPRESSION: No CT or CTA evidence of acute intracranial infarction.  70% stenosis of the proximal right internal carotid artery.  80% stenosis of the proximal left internal carotid artery.   Atherosclerotic disease in the carotid siphon regions with maximal stenosis estimated at 30-40% on both sides.   Electronically Signed   By: Paulina Fusi M.D.   On: 10/20/2014 20:32   Dg Chest 2 View  10/20/2014   CLINICAL DATA:  Atrial fibrillation.  TIA.  EXAM: CHEST  2 VIEW  COMPARISON:  07/25/2014.  FINDINGS: Cardiac pacer with lead tip projected over right ventricle. Cardiomegaly with normal pulmonary vascularity. Low lung volumes with mild bibasilar atelectasis. No pleural effusion or pneumothorax. Diffuse degenerative change and osteopenia thoracic spine with stable compression fractures.  IMPRESSION: 1. Stable cardiomegaly.  No CHF.  Cardiac pacer stable position. 2. Low lung volumes with mild bibasilar subsegmental atelectasis.   Electronically Signed   By: Maisie Fus  Register   On: 10/20/2014 14:52   Ct Head Wo Contrast  10/20/2014   CLINICAL DATA:  Confusion, slurred speech.  EXAM: CT HEAD WITHOUT CONTRAST  TECHNIQUE: Contiguous axial images were obtained from the base of the skull through the vertex without intravenous contrast.  COMPARISON:  CT scan of September 20, 2014.  FINDINGS: Bony calvarium appears intact. Moderate diffuse cortical atrophy is noted. Diffuse ventricular dilatation is noted consistent with the degree of atrophy. Minimal chronic ischemic white matter disease is noted. No mass effect or midline shift is noted. There is no evidence of mass lesion, hemorrhage or acute infarction.  IMPRESSION: Moderate diffuse cortical atrophy. Minimal chronic ischemic white matter disease. No acute intracranial abnormality seen. These results were called by telephone at the time of interpretation on 10/20/2014 at 9:24 am to Dr. Roseanne Reno, who verbally acknowledged these results.   Electronically Signed   By: Lupita Raider, M.D.   On: 10/20/2014 09:25   Ct Angio Neck W/cm &/or Wo/cm  10/20/2014   CLINICAL DATA:  Gait disturbance. Slurred speech. Seizures. Atrial fibrillation. Symptoms began today.   EXAM: CT ANGIOGRAPHY HEAD AND NECK  TECHNIQUE: Multidetector CT imaging of the head and neck was performed using the standard protocol during bolus administration of intravenous contrast. Multiplanar CT image reconstructions and MIPs were obtained to evaluate the vascular anatomy. Carotid stenosis measurements (when applicable) are  obtained utilizing NASCET criteria, using the distal internal carotid diameter as the denominator.  CONTRAST:  50mL OMNIPAQUE IOHEXOL 350 MG/ML SOLN  COMPARISON:  CT earlier same day.  FINDINGS: CT HEAD  Brain shows generalized atrophy with mild chronic small-vessel changes of the white matter. No sign of acute infarction, mass lesion, hemorrhage, hydrocephalus or extra-axial collection.  CTA NECK  Aortic arch: Mild atherosclerosis of the arch without aneurysm or dissection. Branching pattern of the brachiocephalic vessels from the arch is normal without origin stenosis.  Right carotid system: Common carotid artery widely patent to the bifurcation. There is advanced atherosclerotic disease affecting the proximal internal carotid artery. Minimal diameter in the bulb is 1.5 mm. Compared to a more distal cervical ICA diameter of 5 mm, this indicates 70% stenosis.  Left carotid system: Common carotid artery widely patent to the bifurcation region. Complex atherosclerotic disease of the ICA bulb with minimal diameter of 1 mm. Compared to a more distal cervical ICA diameter of 5 mm, this indicates an 80% stenosis.  Vertebral arteries:The left vertebral artery is dominant. There is 20% stenosis at the origin. The vessel is widely patent beyond that. The nondominant right vertebral artery is widely patent at its origin and through the cervical region.  Skeleton: Ordinary spondylosis  Other neck: No significant soft tissue lesion no upper chest lesion.  CTA HEAD  Anterior circulation: Internal carotid arteries are widely patent through the skullbase. There is atherosclerotic calcification in the  siphon regions but no stenosis greater than 30-40%. Supra clinoid internal carotid arteries are widely patent. The anterior and middle cerebral vessels are patent without proximal stenosis, aneurysm or vascular malformation. No missing branches are appreciated.  Posterior circulation: Both vertebral arteries are widely patent through the foramen magnum to the basilar. No basilar stenosis. Posterior circulation branch vessels are patent.  Venous sinuses: Patent and normal  Anatomic variants: None significant  IMPRESSION: No CT or CTA evidence of acute intracranial infarction.  70% stenosis of the proximal right internal carotid artery.  80% stenosis of the proximal left internal carotid artery.  Atherosclerotic disease in the carotid siphon regions with maximal stenosis estimated at 30-40% on both sides.   Electronically Signed   By: Paulina Fusi M.D.   On: 10/20/2014 20:32   Dg Chest Port 1 View  11/04/2014   CLINICAL DATA:  Fever.  EXAM: PORTABLE CHEST 1 VIEW  COMPARISON:  10/20/2014 chest radiograph  FINDINGS: Single lead left subclavian pacemaker is stable in configuration. Stable cardiomediastinal silhouette with mild cardiomegaly. No pneumothorax. No pleural effusion. Mild curvilinear opacity at the left costophrenic angle, favor atelectasis. No pulmonary edema. No additional focal lung opacity.  IMPRESSION: Mild curvilinear opacity at the left costophrenic angle, favor atelectasis.  Stable mild cardiomegaly without pulmonary edema.   Electronically Signed   By: Delbert Phenix M.D.   On: 11/04/2014 18:23     CBC  Recent Labs Lab 11/04/14 1710 11/05/14 0445  WBC 9.7 11.3*  HGB 12.1* 10.4*  HCT 35.6* 31.0*  PLT 205 162  MCV 83.6 85.2  MCH 28.4 28.6  MCHC 34.0 33.5  RDW 14.7 15.2  LYMPHSABS 0.8  --   MONOABS 0.6  --   EOSABS 0.0  --   BASOSABS 0.0  --     Chemistries   Recent Labs Lab 11/04/14 1710 11/05/14 0445  NA 133* 130*  K 3.8 3.8  CL 103 102  CO2 19* 21*  GLUCOSE 111* 131*    BUN 13 14  CREATININE  1.25* 1.29*  CALCIUM 8.6* 7.9*  AST 36  --   ALT 19  --   ALKPHOS 70  --   BILITOT 1.2  --    ------------------------------------------------------------------------------------------------------------------ estimated creatinine clearance is 42.7 mL/min (by C-G formula based on Cr of 1.29). ------------------------------------------------------------------------------------------------------------------ No results for input(s): HGBA1C in the last 72 hours. ------------------------------------------------------------------------------------------------------------------ No results for input(s): CHOL, HDL, LDLCALC, TRIG, CHOLHDL, LDLDIRECT in the last 72 hours. ------------------------------------------------------------------------------------------------------------------ No results for input(s): TSH, T4TOTAL, T3FREE, THYROIDAB in the last 72 hours.  Invalid input(s): FREET3 ------------------------------------------------------------------------------------------------------------------ No results for input(s): VITAMINB12, FOLATE, FERRITIN, TIBC, IRON, RETICCTPCT in the last 72 hours.  Coagulation profile No results for input(s): INR, PROTIME in the last 168 hours.  No results for input(s): DDIMER in the last 72 hours.  Cardiac Enzymes No results for input(s): CKMB, TROPONINI, MYOGLOBIN in the last 168 hours.  Invalid input(s): CK ------------------------------------------------------------------------------------------------------------------ Invalid input(s): POCBNP     Time Spent in minutes   35 minutes   Anayah Arvanitis M.D on 11/05/2014 at 12:19 PM  Between 7am to 7pm - Pager - [phone]  After 7pm go to [email or web address] - password [facility]  [facility]   Office  [phone]

## 2014-11-06 DIAGNOSIS — G459 Transient cerebral ischemic attack, unspecified: Secondary | ICD-10-CM

## 2014-11-06 LAB — CBC
HCT: 31.5 % — ABNORMAL LOW (ref 39.0–52.0)
HEMOGLOBIN: 10.7 g/dL — AB (ref 13.0–17.0)
MCH: 28.9 pg (ref 26.0–34.0)
MCHC: 34 g/dL (ref 30.0–36.0)
MCV: 85.1 fL (ref 78.0–100.0)
Platelets: 170 10*3/uL (ref 150–400)
RBC: 3.7 MIL/uL — AB (ref 4.22–5.81)
RDW: 15.3 % (ref 11.5–15.5)
WBC: 9.3 10*3/uL (ref 4.0–10.5)

## 2014-11-06 LAB — BASIC METABOLIC PANEL
ANION GAP: 5 (ref 5–15)
BUN: 14 mg/dL (ref 6–20)
CHLORIDE: 104 mmol/L (ref 101–111)
CO2: 23 mmol/L (ref 22–32)
Calcium: 7.9 mg/dL — ABNORMAL LOW (ref 8.9–10.3)
Creatinine, Ser: 1.21 mg/dL (ref 0.61–1.24)
GFR calc non Af Amer: 52 mL/min — ABNORMAL LOW (ref >60)
Glucose, Bld: 99 mg/dL (ref 65–99)
POTASSIUM: 3.8 mmol/L (ref 3.5–5.1)
SODIUM: 132 mmol/L — AB (ref 135–145)

## 2014-11-06 MED ORDER — GUAIFENESIN 200 MG PO TABS
200.0000 mg | ORAL_TABLET | ORAL | Status: DC | PRN
Start: 2014-11-06 — End: 2014-11-09
  Administered 2014-11-06: 200 mg via ORAL
  Filled 2014-11-06 (×2): qty 1

## 2014-11-06 NOTE — Care Management Note (Signed)
Case Management Note  Patient Details  Name: Oscar Santiago MRN: [id number] Date of Birth: [birth year]  Subjective/Objective:      Date:  11/06/14 Spoke with patient at the bedside along with [facility] [phone]. Introduced self as Sports coach and explained role in discharge planning and how to be reached. Verified patient lives in town, alone with granddaughter, has DME rolling walker , shower stool , bsc,  and a lift chair thru [facility]. Expressed no  potential need for no other DME. Verified patient anticipates to go home with family, at time of discharge and will have full-time supervision by family  at this time to best of their knowledge. Patient denied needing help with their medication. Patient  is driven by granddaughter to MD appointments. Verified patient has PCP [place]. Patient will have transport home via car at dc.    Plan: CM will continue to follow for discharge planning and [facility] resources.               Action/Plan:   Expected Discharge Date:                  Expected Discharge Plan:  Home w Home Health Services  In-House Referral:     Discharge planning Services  CM Consult  Post Acute Care Choice:  Resumption of Svcs/PTA Provider Choice offered to:  Adult Children  DME Arranged:    DME Agency:     HH Arranged:  RN, PT, OT, Nurse's Aide HH Agency:  [facility]  Status of Service:  In process, will continue to follow  Medicare Important Message Given:    Date Medicare IM Given:    Medicare IM give by:    Date Additional Medicare IM Given:    Additional Medicare Important Message give by:     If discussed at Long Length of Stay Meetings, dates discussed:    Additional Comments:  Leone Haven, RN 11/06/2014, 12:36 PM

## 2014-11-06 NOTE — Progress Notes (Signed)
Patient has home CPAP machine, tubing, and mask. Humidification chamber filled. Family in room with patient stating she can place him on CPAP when ready. Patient was waiting for bedtime meds.

## 2014-11-06 NOTE — Progress Notes (Signed)
Patient Demographics  Oscar Santiago, is a 81 y.o. male, DOB - [birth year], RUE:[id number]  Admit date - 11/04/2014   Admitting Physician Rolly Salter, MD  Outpatient Primary MD for the patient is [name], DO  LOS - 2   Chief Complaint  Patient presents with  . Nausea  . Vomiting  . Weakness  . Urinary Retention         Subjective:   Oscar Santiago today has, No headache, No chest pain, No abdominal pain - No Nausea,No Cough - SOB, reports feeling much better today . Assessment & Plan    Principal Problem:   UTI (urinary tract infection) Active Problems:   Chronic atrial fibrillation (CHADVASc = 5)   Hypertension   Hyperlipidemia   OSA (obstructive sleep apnea)   Cardiac pacemaker in situ   Chronic systolic dysfunction of left ventricle   CKD (chronic kidney disease), stage III   Seizure disorder (HCC)   TIA (transient ischemic attack)   Fever   UTI (lower urinary tract infection)   Retention of urine  Urinary tract infection  -Continue ceftriaxone 1 g daily - Urine cultures growing klebsiella pneumoniae sensitive to Rocephin -Follow up blood cultures : No growth to date   Urinary retention - Continue with Flomax, urology consult appreciated, coude catheter inserted yesterday by urology, follow up in 1-2 weeks as an outpatient with urology.   Atrial fibrillation with pacemaker:  The patient is not on rate control agent at baseline, and he is currently rate controlled. He is not on an anticoagulant at baseline because of a previous GI bleed and his concerns about anticoagulation. After his TIA, Eliquis was recommended, but was being held because of hematuria, patient with recurrent hematuria this a.m.   Hypertension:  This is within goal at admission. -Continue ARB  OSA:  -Continue home CPAP  CKD stage III:  This is at baseline. -Continue ARB   Seizures -Continue  home Keppra  Recent TIA and s/p CEA:  No complaints of focal weakness, face symmetric. -Continue aspirin 325 daily, continue statin   GERD:  -Continue home H2 RA   Hyponatremia: - Most likely related to volume depletion, continue to monitor on IV MS, will check serum and urine osmolality.  Code Status: Full  Family Communication:  granddaughter at bedside  Disposition Plan: Pending further workup   Procedures  None   Consults   urology   Medications  Scheduled Meds: . aspirin  325 mg Oral Daily  . cefTRIAXone (ROCEPHIN)  IV  1 g Intravenous Q24H  . dorzolamide-timolol  1 drop Both Eyes BID  . enoxaparin (LOVENOX) injection  40 mg Subcutaneous Q24H  . feeding supplement (ENSURE ENLIVE)  237 mL Oral BID BM  . fluticasone  1 spray Each Nare Daily  . irbesartan  300 mg Oral Daily  . latanoprost  1 drop Both Eyes QHS  . levETIRAcetam  250 mg Oral q morning - 10a  . levETIRAcetam  500 mg Oral QPM  . levothyroxine  25 mcg Oral QAC breakfast  . pravastatin  20 mg Oral q1800  . tamsulosin  0.4 mg Oral QPC breakfast  . vitamin B-12  1,000 mcg Oral Daily  . zolpidem  2.5 mg Oral QHS  Continuous Infusions: . sodium chloride 75 mL/hr at 11/06/14 0251   PRN Meds:.famotidine, ondansetron **OR** ondansetron (ZOFRAN) IV  DVT Prophylaxis  Lovenox -  Lab Results  Component Value Date   PLT 170 11/06/2014    Antibiotics    Anti-infectives    Start     Dose/Rate Route Frequency Ordered Stop   11/05/14 2000  cefTRIAXone (ROCEPHIN) 1 g in dextrose 5 % 50 mL IVPB     1 g 100 mL/hr over 30 Minutes Intravenous Every 24 hours 11/04/14 2025     11/04/14 1900  cefTRIAXone (ROCEPHIN) 1 g in dextrose 5 % 50 mL IVPB     1 g 100 mL/hr over 30 Minutes Intravenous  Once 11/04/14 1855 11/04/14 2016          Objective:   Filed Vitals:   11/05/14 0541 11/05/14 1300 11/05/14 2217 11/06/14 0531  BP: 143/58 128/58 158/60 135/91  Pulse: 66 79 64 51  Temp: 100.1 F (37.8 C)  98.4 F (36.9 C) 99.6 F (37.6 C) 100 F (37.8 C)  TempSrc: Oral Oral Oral Oral  Resp: 20 18 24 14   Height:      Weight:      SpO2: 95% 97% 98% 99%    Wt Readings from Last 3 Encounters:  11/04/14 84.7 kg (186 lb 11.7 oz)  10/28/14 88.7 kg (195 lb 8.8 oz)  10/13/14 84.823 kg (187 lb)     Intake/Output Summary (Last 24 hours) at 11/06/14 1210 Last data filed at 11/06/14 0933  Gross per 24 hour  Intake   1550 ml  Output    875 ml  Net    675 ml     Physical Exam  Awake Alert, Supple Neck,No JVD,  Symmetrical Chest wall movement, Good air movement bilaterally,  RRR,No Gallops,Rubs or new Murmurs, No Parasternal Heave +ve B.Sounds, Abd Soft, No tenderness, No organomegaly appriciated, Hematuria Foley  No Cyanosis, Clubbing or edema, No new Rash or bruise     Data Review   Micro Results Recent Results (from the past 240 hour(s))  Culture, blood (routine x 2)     Status: None (Preliminary result)   Collection Time: 11/04/14  4:23 PM  Result Value Ref Range Status   Specimen Description BLOOD RIGHT ANTECUBITAL  Final   Special Requests BOTTLES DRAWN AEROBIC AND ANAEROBIC 5 CC  Final   Culture NO GROWTH < 24 HOURS  Final   Report Status PENDING  Incomplete  Culture, blood (routine x 2)     Status: None (Preliminary result)   Collection Time: 11/04/14  4:30 PM  Result Value Ref Range Status   Specimen Description BLOOD RIGHT HAND  Final   Special Requests BOTTLES DRAWN AEROBIC AND ANAEROBIC 5CC  Final   Culture NO GROWTH < 24 HOURS  Final   Report Status PENDING  Incomplete  Urine culture     Status: None (Preliminary result)   Collection Time: 11/04/14  4:42 PM  Result Value Ref Range Status   Specimen Description URINE, CLEAN CATCH  Final   Special Requests NONE  Final   Culture   Final    >=100,000 COLONIES/mL KLEBSIELLA PNEUMONIAE CULTURE REINCUBATED FOR BETTER GROWTH    Report Status PENDING  Incomplete   Organism ID, Bacteria KLEBSIELLA PNEUMONIAE  Final       Susceptibility   Klebsiella pneumoniae - MIC*    AMPICILLIN >=32 RESISTANT Resistant     CEFAZOLIN <=4 SENSITIVE Sensitive     CEFTRIAXONE <=1 SENSITIVE Sensitive  CIPROFLOXACIN <=0.25 SENSITIVE Sensitive     GENTAMICIN <=1 SENSITIVE Sensitive     IMIPENEM <=0.25 SENSITIVE Sensitive     NITROFURANTOIN 32 SENSITIVE Sensitive     TRIMETH/SULFA <=20 SENSITIVE Sensitive     AMPICILLIN/SULBACTAM 8 SENSITIVE Sensitive     PIP/TAZO <=4 SENSITIVE Sensitive     * >=100,000 COLONIES/mL KLEBSIELLA PNEUMONIAE    Radiology Reports Ct Angio Head W/cm &/or Wo Cm  10/20/2014   CLINICAL DATA:  Gait disturbance. Slurred speech. Seizures. Atrial fibrillation. Symptoms began today.  EXAM: CT ANGIOGRAPHY HEAD AND NECK  TECHNIQUE: Multidetector CT imaging of the head and neck was performed using the standard protocol during bolus administration of intravenous contrast. Multiplanar CT image reconstructions and MIPs were obtained to evaluate the vascular anatomy. Carotid stenosis measurements (when applicable) are obtained utilizing NASCET criteria, using the distal internal carotid diameter as the denominator.  CONTRAST:  50mL OMNIPAQUE IOHEXOL 350 MG/ML SOLN  COMPARISON:  CT earlier same day.  FINDINGS: CT HEAD  Brain shows generalized atrophy with mild chronic small-vessel changes of the white matter. No sign of acute infarction, mass lesion, hemorrhage, hydrocephalus or extra-axial collection.  CTA NECK  Aortic arch: Mild atherosclerosis of the arch without aneurysm or dissection. Branching pattern of the brachiocephalic vessels from the arch is normal without origin stenosis.  Right carotid system: Common carotid artery widely patent to the bifurcation. There is advanced atherosclerotic disease affecting the proximal internal carotid artery. Minimal diameter in the bulb is 1.5 mm. Compared to a more distal cervical ICA diameter of 5 mm, this indicates 70% stenosis.  Left carotid system: Common carotid artery  widely patent to the bifurcation region. Complex atherosclerotic disease of the ICA bulb with minimal diameter of 1 mm. Compared to a more distal cervical ICA diameter of 5 mm, this indicates an 80% stenosis.  Vertebral arteries:The left vertebral artery is dominant. There is 20% stenosis at the origin. The vessel is widely patent beyond that. The nondominant right vertebral artery is widely patent at its origin and through the cervical region.  Skeleton: Ordinary spondylosis  Other neck: No significant soft tissue lesion no upper chest lesion.  CTA HEAD  Anterior circulation: Internal carotid arteries are widely patent through the skullbase. There is atherosclerotic calcification in the siphon regions but no stenosis greater than 30-40%. Supra clinoid internal carotid arteries are widely patent. The anterior and middle cerebral vessels are patent without proximal stenosis, aneurysm or vascular malformation. No missing branches are appreciated.  Posterior circulation: Both vertebral arteries are widely patent through the foramen magnum to the basilar. No basilar stenosis. Posterior circulation branch vessels are patent.  Venous sinuses: Patent and normal  Anatomic variants: None significant  IMPRESSION: No CT or CTA evidence of acute intracranial infarction.  70% stenosis of the proximal right internal carotid artery.  80% stenosis of the proximal left internal carotid artery.  Atherosclerotic disease in the carotid siphon regions with maximal stenosis estimated at 30-40% on both sides.   Electronically Signed   By: Paulina Fusi M.D.   On: 10/20/2014 20:32   Dg Chest 2 View  10/20/2014   CLINICAL DATA:  Atrial fibrillation.  TIA.  EXAM: CHEST  2 VIEW  COMPARISON:  07/25/2014.  FINDINGS: Cardiac pacer with lead tip projected over right ventricle. Cardiomegaly with normal pulmonary vascularity. Low lung volumes with mild bibasilar atelectasis. No pleural effusion or pneumothorax. Diffuse degenerative change and  osteopenia thoracic spine with stable compression fractures.  IMPRESSION: 1. Stable cardiomegaly.  No  CHF.  Cardiac pacer stable position. 2. Low lung volumes with mild bibasilar subsegmental atelectasis.   Electronically Signed   By: Maisie Fus  Register   On: 10/20/2014 14:52   Ct Head Wo Contrast  10/20/2014   CLINICAL DATA:  Confusion, slurred speech.  EXAM: CT HEAD WITHOUT CONTRAST  TECHNIQUE: Contiguous axial images were obtained from the base of the skull through the vertex without intravenous contrast.  COMPARISON:  CT scan of September 20, 2014.  FINDINGS: Bony calvarium appears intact. Moderate diffuse cortical atrophy is noted. Diffuse ventricular dilatation is noted consistent with the degree of atrophy. Minimal chronic ischemic white matter disease is noted. No mass effect or midline shift is noted. There is no evidence of mass lesion, hemorrhage or acute infarction.  IMPRESSION: Moderate diffuse cortical atrophy. Minimal chronic ischemic white matter disease. No acute intracranial abnormality seen. These results were called by telephone at the time of interpretation on 10/20/2014 at 9:24 am to Dr. Roseanne Reno, who verbally acknowledged these results.   Electronically Signed   By: Lupita Raider, M.D.   On: 10/20/2014 09:25   Ct Angio Neck W/cm &/or Wo/cm  10/20/2014   CLINICAL DATA:  Gait disturbance. Slurred speech. Seizures. Atrial fibrillation. Symptoms began today.  EXAM: CT ANGIOGRAPHY HEAD AND NECK  TECHNIQUE: Multidetector CT imaging of the head and neck was performed using the standard protocol during bolus administration of intravenous contrast. Multiplanar CT image reconstructions and MIPs were obtained to evaluate the vascular anatomy. Carotid stenosis measurements (when applicable) are obtained utilizing NASCET criteria, using the distal internal carotid diameter as the denominator.  CONTRAST:  50mL OMNIPAQUE IOHEXOL 350 MG/ML SOLN  COMPARISON:  CT earlier same day.  FINDINGS: CT HEAD  Brain  shows generalized atrophy with mild chronic small-vessel changes of the white matter. No sign of acute infarction, mass lesion, hemorrhage, hydrocephalus or extra-axial collection.  CTA NECK  Aortic arch: Mild atherosclerosis of the arch without aneurysm or dissection. Branching pattern of the brachiocephalic vessels from the arch is normal without origin stenosis.  Right carotid system: Common carotid artery widely patent to the bifurcation. There is advanced atherosclerotic disease affecting the proximal internal carotid artery. Minimal diameter in the bulb is 1.5 mm. Compared to a more distal cervical ICA diameter of 5 mm, this indicates 70% stenosis.  Left carotid system: Common carotid artery widely patent to the bifurcation region. Complex atherosclerotic disease of the ICA bulb with minimal diameter of 1 mm. Compared to a more distal cervical ICA diameter of 5 mm, this indicates an 80% stenosis.  Vertebral arteries:The left vertebral artery is dominant. There is 20% stenosis at the origin. The vessel is widely patent beyond that. The nondominant right vertebral artery is widely patent at its origin and through the cervical region.  Skeleton: Ordinary spondylosis  Other neck: No significant soft tissue lesion no upper chest lesion.  CTA HEAD  Anterior circulation: Internal carotid arteries are widely patent through the skullbase. There is atherosclerotic calcification in the siphon regions but no stenosis greater than 30-40%. Supra clinoid internal carotid arteries are widely patent. The anterior and middle cerebral vessels are patent without proximal stenosis, aneurysm or vascular malformation. No missing branches are appreciated.  Posterior circulation: Both vertebral arteries are widely patent through the foramen magnum to the basilar. No basilar stenosis. Posterior circulation branch vessels are patent.  Venous sinuses: Patent and normal  Anatomic variants: None significant  IMPRESSION: No CT or CTA  evidence of acute intracranial infarction.  70% stenosis  of the proximal right internal carotid artery.  80% stenosis of the proximal left internal carotid artery.  Atherosclerotic disease in the carotid siphon regions with maximal stenosis estimated at 30-40% on both sides.   Electronically Signed   By: Paulina Fusi M.D.   On: 10/20/2014 20:32   Dg Chest Port 1 View  11/04/2014   CLINICAL DATA:  Fever.  EXAM: PORTABLE CHEST 1 VIEW  COMPARISON:  10/20/2014 chest radiograph  FINDINGS: Single lead left subclavian pacemaker is stable in configuration. Stable cardiomediastinal silhouette with mild cardiomegaly. No pneumothorax. No pleural effusion. Mild curvilinear opacity at the left costophrenic angle, favor atelectasis. No pulmonary edema. No additional focal lung opacity.  IMPRESSION: Mild curvilinear opacity at the left costophrenic angle, favor atelectasis.  Stable mild cardiomegaly without pulmonary edema.   Electronically Signed   By: Delbert Phenix M.D.   On: 11/04/2014 18:23     CBC  Recent Labs Lab 11/04/14 1710 11/05/14 0445 11/06/14 0629  WBC 9.7 11.3* 9.3  HGB 12.1* 10.4* 10.7*  HCT 35.6* 31.0* 31.5*  PLT 205 162 170  MCV 83.6 85.2 85.1  MCH 28.4 28.6 28.9  MCHC 34.0 33.5 34.0  RDW 14.7 15.2 15.3  LYMPHSABS 0.8  --   --   MONOABS 0.6  --   --   EOSABS 0.0  --   --   BASOSABS 0.0  --   --     Chemistries   Recent Labs Lab 11/04/14 1710 11/05/14 0445 11/06/14 0629  NA 133* 130* 132*  K 3.8 3.8 3.8  CL 103 102 104  CO2 19* 21* 23  GLUCOSE 111* 131* 99  BUN CREATININE 1.25* 1.29* 1.21  CALCIUM 8.6* 7.9* 7.9*  AST 36  --   --   ALT 19  --   --   ALKPHOS 70  --   --   BILITOT 1.2  --   --    ------------------------------------------------------------------------------------------------------------------ estimated creatinine clearance is 45.5 mL/min (by C-G formula based on Cr of  1.21). ------------------------------------------------------------------------------------------------------------------ No results for input(s): HGBA1C in the last 72 hours. ------------------------------------------------------------------------------------------------------------------ No results for input(s): CHOL, HDL, LDLCALC, TRIG, CHOLHDL, LDLDIRECT in the last 72 hours. ------------------------------------------------------------------------------------------------------------------ No results for input(s): TSH, T4TOTAL, T3FREE, THYROIDAB in the last 72 hours.  Invalid input(s): FREET3 ------------------------------------------------------------------------------------------------------------------ No results for input(s): VITAMINB12, FOLATE, FERRITIN, TIBC, IRON, RETICCTPCT in the last 72 hours.  Coagulation profile No results for input(s): INR, PROTIME in the last 168 hours.  No results for input(s): DDIMER in the last 72 hours.  Cardiac Enzymes No results for input(s): CKMB, TROPONINI, MYOGLOBIN in the last 168 hours.  Invalid input(s): CK ------------------------------------------------------------------------------------------------------------------ Invalid input(s): POCBNP     Time Spent in minutes   35 minutes   Oscar Santiago M.D on 11/06/2014 at 12:10 PM  Between 7am to 7pm - Pager - [phone]  After 7pm go to [email or web address] - password [facility]  [facility]   Office  [phone]

## 2014-11-06 NOTE — Evaluation (Signed)
Physical Therapy Evaluation Patient Details Name: Oscar Santiago MRN: [id number] DOB: [birth year] Today's Date: 11/06/2014   History of Present Illness  Pt adm with UTI after recent adm for TIA. PMH - epilepsy, hypertension, hypothyroidism, OSA on CPAP, chronic systolic CHF (EF 01%), chronic A. fib not on anticoagulation, heart block with pacer, CKD stage III  Clinical Impression  Pt admitted with above diagnosis and presents to PT with functional limitations due to deficits listed below (See PT problem list). Pt needs skilled PT to maximize independence and safety to allow discharge to home.      Follow Up Recommendations Home health PT;Supervision/Assistance - 24 hour    Equipment Recommendations  None recommended by PT    Recommendations for Other Services       Precautions / Restrictions Precautions Precautions: Fall      Mobility  Bed Mobility                  Transfers Overall transfer level: Needs assistance Equipment used: Rolling walker (2 wheeled) Transfers: Sit to/from Stand Sit to Stand: Min guard         General transfer comment:  (for safety and balance)  Ambulation/Gait Ambulation/Gait assistance: Min guard Ambulation Distance (Feet): 250 Feet Assistive device: Rolling walker (2 wheeled) Gait Pattern/deviations: Step-through pattern;Decreased stride length     General Gait Details: Verbal cues to stay closer to the walker  Stairs            Wheelchair Mobility    Modified Rankin (Stroke Patients Only)       Balance Overall balance assessment: Needs assistance Sitting-balance support: Feet supported;No upper extremity supported Sitting balance-Leahy Scale: Good     Standing balance support: No upper extremity supported;During functional activity Standing balance-Leahy Scale: Fair                               Pertinent Vitals/Pain Pain Assessment: No/denies pain    Home Living Family/patient expects to be  discharged to:: Private residence Living Arrangements: Other (Comment) (granddaughter) Available Help at Discharge: Family;Available 24 hours/day Type of Home: House Home Access: Ramped entrance     Home Layout: Two level;Laundry or work area in basement;Able to live on main level with bedroom/bathroom Home Equipment: Gilmer Mor - single point;Bedside commode Additional Comments: Getting lift chair    Prior Function Level of Independence: Independent with assistive device(s) (Until recent admission)         Comments: Using walker since recent admission     Hand Dominance   Dominant Hand: Right    Extremity/Trunk Assessment   Upper Extremity Assessment: Overall WFL for tasks assessed           Lower Extremity Assessment: Generalized weakness      Cervical / Trunk Assessment: Normal  Communication   Communication: HOH  Cognition Arousal/Alertness: Awake/alert Behavior During Therapy: WFL for tasks assessed/performed Overall Cognitive Status: Within Functional Limits for tasks assessed                      General Comments      Exercises        Assessment/Plan    PT Assessment Patient needs continued PT services  PT Diagnosis Generalized weakness   PT Problem List Decreased strength;Decreased activity tolerance;Decreased balance;Decreased mobility;Decreased knowledge of use of DME  PT Treatment Interventions DME instruction;Gait training;Functional mobility training;Therapeutic activities;Therapeutic exercise;Balance training;Patient/family education   PT Goals (Current goals can  be found in the Care Plan section) Acute Rehab PT Goals Patient Stated Goal: return home PT Goal Formulation: With patient Time For Goal Achievement: 2014-12-02 Potential to Achieve Goals: Good    Frequency Min 3X/week   Barriers to discharge        Co-evaluation               End of Session   Activity Tolerance: Patient tolerated treatment well Patient left: in  chair;with call bell/phone within reach;with family/visitor present           Time: 5409-8119 PT Time Calculation (min) (ACUTE ONLY): 15 min   Charges:   PT Evaluation $Initial PT Evaluation Tier I: 1 Procedure     PT G Codes:        Tiney Zipper 02-Dec-2014, 3:57 PM [facility] PT [phone]

## 2014-11-07 ENCOUNTER — Telehealth: Payer: Self-pay

## 2014-11-07 ENCOUNTER — Encounter: Payer: Self-pay | Admitting: *Deleted

## 2014-11-07 ENCOUNTER — Encounter: Payer: Medicare Other | Admitting: Vascular Surgery

## 2014-11-07 LAB — BASIC METABOLIC PANEL
Anion gap: 4 — ABNORMAL LOW (ref 5–15)
BUN: 14 mg/dL (ref 6–20)
CALCIUM: 7.7 mg/dL — AB (ref 8.9–10.3)
CO2: 22 mmol/L (ref 22–32)
CREATININE: 1.13 mg/dL (ref 0.61–1.24)
Chloride: 108 mmol/L (ref 101–111)
GFR, EST NON AFRICAN AMERICAN: 56 mL/min — AB (ref >60)
Glucose, Bld: 92 mg/dL (ref 65–99)
Potassium: 3.7 mmol/L (ref 3.5–5.1)
Sodium: 134 mmol/L — ABNORMAL LOW (ref 135–145)

## 2014-11-07 NOTE — Clinical Documentation Improvement (Signed)
Internal Medicine  A cause and effect relationship may not be assumed and must be documented by a provider.  Please clarify the relationship, if any, between *UTI* and *Chronic foley that was removed prior to admission*.  Are the conditions:   Due to or associated with each other  Unrelated to each other  Other  Clinically Undetermined   Supporting Information (risk factors, sign and symptoms, diagnostics, treatment): *Per Consult note 10/01: "Oscar Santiago was in the hospital in August for a TIA and had a traumatic foley placement with hematuria. He followed up in our office yesterday for a voiding trial and was actually voiding well until this morning when he had progressive difficulty. His PVR in the ER was and because of his prior history I was asked to place the foley"**   Please exercise your independent, professional judgment when responding. A specific answer is not anticipated or expected.   Thank You,  Cruzita Lederer Health Information Management [place] [phone]

## 2014-11-07 NOTE — Telephone Encounter (Signed)
[employer] has approved the request for coverage on Vimpat effective until 11/06/2016, or until the policy changes or is terminated.  Ref # [id number] Patient ID: [id number]

## 2014-11-07 NOTE — Patient Outreach (Signed)
[facility]) Care Management  11/07/2014  Oscar Santiago [birth year] [id number]   Referral from Charlesetta Shanks, RN, assigned [name], RN.  Thanks, [name]. [name] [facility] Care Management [facility] CM Assistant Phone: [phone] Fax: [phone]

## 2014-11-07 NOTE — Consult Note (Signed)
   [facility] CM Inpatient Consult   11/07/2014  Oscar Santiago August 30, 1925 [id number]  Patient evaluated for community based chronic disease management services with [place] [facility] as a benefit of patient's Medicare  Insurance. Met with patient and Granddaughter, Oscar Santiago) at bedside to explain [place] Management services. Patient endorses that Dr.Bruce Burchette is his primary care provider. Consent form obtained and folder given with contact information at the bedside.  Patient will receive post discharge transition of care call and will be evaluated for monthly home visits for assessments and disease process education.   Made Inpatient Case Manager aware that [place] Management following. Of note, [facility] Care Management services does not replace or interfere with any services that are arranged by inpatient case management or social work.  For additional questions or referrals please contact;  Lilla Shook, [place],  [place], RN, [facility], [place] Care Management [phone]

## 2014-11-07 NOTE — Progress Notes (Signed)
Patient has home CPAP. Places self on and off with help from family.

## 2014-11-07 NOTE — Progress Notes (Signed)
Physical Therapy Treatment Patient Details Name: Oscar Santiago MRN: [id number] DOB: [birth year] Today's Date: 11-10-14    History of Present Illness Pt adm with UTI after recent adm for TIA. PMH - epilepsy, hypertension, hypothyroidism, OSA on CPAP, chronic systolic CHF (EF 78%), chronic A. fib not on anticoagulation, heart block with pacer, CKD stage III    PT Comments    Pt seems a little more fatigued today but still able to amb.  Follow Up Recommendations  Home health PT;Supervision/Assistance - 24 hour     Equipment Recommendations  None recommended by PT    Recommendations for Other Services       Precautions / Restrictions Precautions Precautions: Fall Restrictions Weight Bearing Restrictions: No    Mobility  Bed Mobility                  Transfers Overall transfer level: Needs assistance Equipment used: Rolling walker (2 wheeled) Transfers: Sit to/from Stand Sit to Stand: Min guard         General transfer comment: Incr time and effort (for safety and balance)  Ambulation/Gait Ambulation/Gait assistance: Min guard Ambulation Distance (Feet): 170 Feet Assistive device: Rolling walker (2 wheeled) Gait Pattern/deviations: Step-through pattern Gait velocity: decr Gait velocity interpretation: Below normal speed for age/gender General Gait Details: Verbal cues to stay closer to the walker. Pt with dyspnea 2/4 but SpO2 100% on RA.   Stairs            Wheelchair Mobility    Modified Rankin (Stroke Patients Only)       Balance   Sitting-balance support: Feet supported;No upper extremity supported Sitting balance-Leahy Scale: Good       Standing balance-Leahy Scale: Fair                      Cognition Arousal/Alertness: Awake/alert Behavior During Therapy: WFL for tasks assessed/performed Overall Cognitive Status: Within Functional Limits for tasks assessed                      Exercises      General  Comments        Pertinent Vitals/Pain      Home Living                      Prior Function            PT Goals (current goals can now be found in the care plan section) Acute Rehab PT Goals Patient Stated Goal: return home PT Goal Formulation: With patient Time For Goal Achievement: 11/06/14 Potential to Achieve Goals: Good Progress towards PT goals: Progressing toward goals    Frequency  Min 3X/week    PT Plan Current plan remains appropriate    Co-evaluation             End of Session Equipment Utilized During Treatment: Gait belt Activity Tolerance: Patient tolerated treatment well Patient left: in chair;with call bell/phone within reach;with family/visitor present     Time: 2956-2130 PT Time Calculation (min) (ACUTE ONLY): 21 min  Charges:  $Gait Training: 8-22 mins                    G Codes:      Oscar Santiago 2014/11/10, 2:28 PM [employer] PT [phone]

## 2014-11-07 NOTE — Care Management Important Message (Signed)
Important Message  Patient Details  Name: Oscar Santiago MRN: [id number] Date of Birth: August 04, 1925   Medicare Important Message Given:  Yes-second notification given    Kyla Balzarine 11/07/2014, 2:03 PM

## 2014-11-07 NOTE — Progress Notes (Signed)
Irrigated urethral catheter with Dancy, PA. Clots irrigated and urine now light strawberry color and clear.

## 2014-11-07 NOTE — Progress Notes (Signed)
Subjective: Patient reports feeling well. He is resting comfortably.  Currently AF.  Foley draining well but with dark red urine.  Cr improving.  Has Klebsiella UTI on Rocephin.    Objective: Vital signs in last 24 hours: Temp:  [98.2 F (36.8 C)-99.4 F (37.4 C)] 98.2 F (36.8 C) (10/04 0400) Pulse Rate:  [56-129] 56 (10/04 0400) Resp:  [16-18] 16 (10/04 0400) BP: (146-149)/(59-67) 149/59 mmHg (10/04 0400) SpO2:  [99 %] 99 % (10/04 0400)  Intake/Output from previous day: 10/03 0701 - 10/04 0700 In: 2003.8 [P.O.:240; I.V.:1763.8] Out: 300 [Urine:300] Intake/Output this shift: Total I/O In: 120 [P.O.:120] Out: 400 [Urine:400]  Physical Exam:  General:alert, cooperative and no distress GI: soft, non tender, no masses Foley in place with dark red urine   Lab Results:  Recent Labs  11/04/14 1710 11/05/14 0445 11/06/14 0629  HGB 12.1* 10.4* 10.7*  HCT 35.6* 31.0* 31.5*   BMET  Recent Labs  11/06/14 0629 11/07/14 0610  NA 132* 134*  K 3.8 3.7  CL 104 108  CO2 23 22  GLUCOSE 99 92  BUN 14 14  CREATININE 1.21 1.13  CALCIUM 7.9* 7.7*   No results for input(s): LABPT, INR in the last 72 hours. No results for input(s): LABURIN in the last 72 hours. Results for orders placed or performed during the hospital encounter of 11/04/14  Culture, blood (routine x 2)     Status: None (Preliminary result)   Collection Time: 11/04/14  4:23 PM  Result Value Ref Range Status   Specimen Description BLOOD RIGHT ANTECUBITAL  Final   Special Requests BOTTLES DRAWN AEROBIC AND ANAEROBIC 5 CC  Final   Culture NO GROWTH 3 DAYS  Final   Report Status PENDING  Incomplete  Culture, blood (routine x 2)     Status: None (Preliminary result)   Collection Time: 11/04/14  4:30 PM  Result Value Ref Range Status   Specimen Description BLOOD RIGHT HAND  Final   Special Requests BOTTLES DRAWN AEROBIC AND ANAEROBIC 5CC  Final   Culture NO GROWTH 3 DAYS  Final   Report Status PENDING   Incomplete  Urine culture     Status: None (Preliminary result)   Collection Time: 11/04/14  4:42 PM  Result Value Ref Range Status   Specimen Description URINE, CLEAN CATCH  Final   Special Requests NONE  Final   Culture   Final    >=100,000 COLONIES/mL KLEBSIELLA PNEUMONIAE >=100,000 COLONIES/mL PSEUDOMONAS AERUGINOSA    Report Status PENDING  Incomplete   Organism ID, Bacteria KLEBSIELLA PNEUMONIAE  Final      Susceptibility   Klebsiella pneumoniae - MIC*    AMPICILLIN >=32 RESISTANT Resistant     CEFAZOLIN <=4 SENSITIVE Sensitive     CEFTRIAXONE <=1 SENSITIVE Sensitive     CIPROFLOXACIN <=0.25 SENSITIVE Sensitive     GENTAMICIN <=1 SENSITIVE Sensitive     IMIPENEM <=0.25 SENSITIVE Sensitive     NITROFURANTOIN 32 SENSITIVE Sensitive     TRIMETH/SULFA <=20 SENSITIVE Sensitive     AMPICILLIN/SULBACTAM 8 SENSITIVE Sensitive     PIP/TAZO <=4 SENSITIVE Sensitive     * >=100,000 COLONIES/mL KLEBSIELLA PNEUMONIAE    Studies/Results: No results found.  Assessment/Plan:      Urinary retention--continue foley for 1-2 weeks with outpt void trial   UTI--continue Rocephin.  Will need 7 day course of ABx therapy with repeat UA after completion to ensure resolution of infection.  Hematuria--due to combination of foley trauma, UTI, and Lovenox/ASA.  Anticoag  being held after today. Should resolve as these issues improve/resolve.  I showed his RN how to properly irrigate the foley today. Irrigate foley prn.    LOS: 3 days   Ceira Hoeschen 11/07/2014, 2:57 PM

## 2014-11-07 NOTE — Progress Notes (Signed)
Patient active with [facility], referral made by Konrad Dolores, resume services order placed. Patient being screened for services by [facility] as well.

## 2014-11-07 NOTE — Progress Notes (Signed)
Patient Demographics  Oscar Santiago, is a 81 y.o. male, DOB - 09/13/1925, VHQ:[id number]  Admit date - 11/04/2014   Admitting Physician Rolly Salter, MD  Outpatient Primary MD for the patient is [name], DO  LOS - 3   Chief Complaint  Patient presents with  . Nausea  . Vomiting  . Weakness  . Urinary Retention       Admission history of present illness/brief narrative: 81 y.o. male withhistory of epilepsy, hypertension, hypothyroidism, OSA on CPAP, chronic systolic CHF (EF 41%), chronic A. fib not on anticoagulation, heart block with pacer, CKD stage III, BPH and recent TIA who presents with a febrile UTI.  recently admission for TIA (left hemiparesis, resolved). underwent right CEA. had urinary retention and coud catheter was placed by urology, seen by urology 1 day prior to admission, coud catheter was discontinued, presents with fever 101, tachycardia, and recurrence of urinary retention, coud catheter was inserted by Dr. Annabell Howells in ED. Patient clinically improving on Rocephin, but have worsening hematuria, aspirin and Lovenox has been held starting from 11/07/14.  Subjective:   Oscar Santiago today has, No headache, No chest pain, No abdominal pain - No Nausea,No Cough - SOB, reports feeling  better today, having worsening hematuria . Assessment & Plan    Principal Problem:   UTI (urinary tract infection) Active Problems:   Chronic atrial fibrillation (CHADVASc = 5)   Hypertension   Hyperlipidemia   OSA (obstructive sleep apnea)   Cardiac pacemaker in situ   Chronic systolic dysfunction of left ventricle   CKD (chronic kidney disease), stage III   Seizure disorder (HCC)   TIA (transient ischemic attack)   Fever   UTI (lower urinary tract infection)   Retention of urine  Urinary tract infection  - Continue ceftriaxone 1 g day 4 out of 7, will be able to switch to oral on  discharge. - Urine cultures growing klebsiella pneumoniae sensitive to Rocephin -Follow up blood cultures : No growth to date   Urinary retention - Seen by neurology, failed voiding trial as an outpatient, coud catheter was reinserted upon admission by Dr. Annabell Howells,  - Continue with Flomax, - follow up in 1-2 weeks as an outpatient with urology, Dr. Patsi Sears  Hematuria - Patient with worsening hematuria this morning, hold aspirin and Lovenox, will start on Foley flushes, will be seen by Dr. Patsi Sears today.   Atrial fibrillation with pacemaker:  - Rate controlled without meds  -  He is not on an anticoagulant at baseline because of a previous GI bleed and his concerns about anticoagulation. After his recent TIA, Eliquis was recommended, not started because of hematuria during previous hospital stay , unfortunately having recurrence of significant hematuria this hospital stay as well , currently holding aspirin and Lovenox for that .   Hypertension:  - Acceptable, Continue ARB.  OSA:  -Continue home CPAP  CKD stage III:  - at baseline. - Continue ARB   Seizures - Continue home Keppra  Recent TIA and s/p CEA:  - No complaints of focal weakness, face symmetric. - Full dose aspirin currently on hold giving his hematuria   GERD:  -Continue home H2 RA   Hyponatremia: - Most likely related to volume depletion, improving with  IV fluids.  Code Status: Full  Family Communication:  Discussed with daughter over the phone.  Disposition Plan: Home when material results.   Procedures  None   Consults   urology   Medications  Scheduled Meds: . cefTRIAXone (ROCEPHIN)  IV  1 g Intravenous Q24H  . dorzolamide-timolol  1 drop Both Eyes BID  . feeding supplement (ENSURE ENLIVE)  237 mL Oral BID BM  . fluticasone  1 spray Each Nare Daily  . irbesartan  300 mg Oral Daily  . latanoprost  1 drop Both Eyes QHS  . levETIRAcetam  250 mg Oral q morning - 10a  .  levETIRAcetam  500 mg Oral QPM  . levothyroxine  25 mcg Oral QAC breakfast  . pravastatin  20 mg Oral q1800  . tamsulosin  0.4 mg Oral QPC breakfast  . vitamin B-12  1,000 mcg Oral Daily  . zolpidem  2.5 mg Oral QHS   Continuous Infusions: . sodium chloride 75 mL/hr at 11/07/14 0447   PRN Meds:.famotidine, guaiFENesin, ondansetron **OR** ondansetron (ZOFRAN) IV  DVT Prophylaxis  Lovenox -  Lab Results  Component Value Date   PLT 170 11/06/2014    Antibiotics    Anti-infectives    Start     Dose/Rate Route Frequency Ordered Stop   11/05/14 2000  cefTRIAXone (ROCEPHIN) 1 g in dextrose 5 % 50 mL IVPB     1 g 100 mL/hr over 30 Minutes Intravenous Every 24 hours 11/04/14 2025     11/04/14 1900  cefTRIAXone (ROCEPHIN) 1 g in dextrose 5 % 50 mL IVPB     1 g 100 mL/hr over 30 Minutes Intravenous  Once 11/04/14 1855 11/04/14 2016          Objective:   Filed Vitals:   11/06/14 0531 11/06/14 1438 11/06/14 2248 11/07/14 0400  BP: 135/91 143/48 146/67 149/59  Pulse: 51 47 129 56  Temp: 100 F (37.8 C) 97.7 F (36.5 C) 99.4 F (37.4 C) 98.2 F (36.8 C)  TempSrc: Oral Oral Oral Oral  Resp: Height:      Weight:      SpO2: 99% 100% 99% 99%    Wt Readings from Last 3 Encounters:  11/04/14 84.7 kg (186 lb 11.7 oz)  10/28/14 88.7 kg (195 lb 8.8 oz)  10/13/14 84.823 kg (187 lb)     Intake/Output Summary (Last 24 hours) at 11/07/14 1055 Last data filed at 11/07/14 0959  Gross per 24 hour  Intake 1883.75 ml  Output    300 ml  Net 1583.75 ml     Physical Exam  Awake Alert, Supple Neck,No JVD,  Symmetrical Chest wall movement, Good air movement bilaterally,  RRR,No Gallops,Rubs or new Murmurs, No Parasternal Heave +ve B.Sounds, Abd Soft, No tenderness, No organomegaly appriciated, Hematuria color is more dark day, appears to be worsening . No Cyanosis, Clubbing or edema, No new Rash or bruise     Data Review   Micro Results Recent Results (from  the past 240 hour(s))  Culture, blood (routine x 2)     Status: None (Preliminary result)   Collection Time: 11/04/14  4:23 PM  Result Value Ref Range Status   Specimen Description BLOOD RIGHT ANTECUBITAL  Final   Special Requests BOTTLES DRAWN AEROBIC AND ANAEROBIC 5 CC  Final   Culture NO GROWTH 2 DAYS  Final   Report Status PENDING  Incomplete  Culture, blood (routine x 2)     Status: None (Preliminary  result)   Collection Time: 11/04/14  4:30 PM  Result Value Ref Range Status   Specimen Description BLOOD RIGHT HAND  Final   Special Requests BOTTLES DRAWN AEROBIC AND ANAEROBIC 5CC  Final   Culture NO GROWTH 2 DAYS  Final   Report Status PENDING  Incomplete  Urine culture     Status: None (Preliminary result)   Collection Time: 11/04/14  4:42 PM  Result Value Ref Range Status   Specimen Description URINE, CLEAN CATCH  Final   Special Requests NONE  Final   Culture   Final    >=100,000 COLONIES/mL KLEBSIELLA PNEUMONIAE CULTURE REINCUBATED FOR BETTER GROWTH    Report Status PENDING  Incomplete   Organism ID, Bacteria KLEBSIELLA PNEUMONIAE  Final      Susceptibility   Klebsiella pneumoniae - MIC*    AMPICILLIN >=32 RESISTANT Resistant     CEFAZOLIN <=4 SENSITIVE Sensitive     CEFTRIAXONE <=1 SENSITIVE Sensitive     CIPROFLOXACIN <=0.25 SENSITIVE Sensitive     GENTAMICIN <=1 SENSITIVE Sensitive     IMIPENEM <=0.25 SENSITIVE Sensitive     NITROFURANTOIN 32 SENSITIVE Sensitive     TRIMETH/SULFA <=20 SENSITIVE Sensitive     AMPICILLIN/SULBACTAM 8 SENSITIVE Sensitive     PIP/TAZO <=4 SENSITIVE Sensitive     * >=100,000 COLONIES/mL KLEBSIELLA PNEUMONIAE    Radiology Reports Ct Angio Head W/cm &/or Wo Cm  10/20/2014   CLINICAL DATA:  Gait disturbance. Slurred speech. Seizures. Atrial fibrillation. Symptoms began today.  EXAM: CT ANGIOGRAPHY HEAD AND NECK  TECHNIQUE: Multidetector CT imaging of the head and neck was performed using the standard protocol during bolus administration  of intravenous contrast. Multiplanar CT image reconstructions and MIPs were obtained to evaluate the vascular anatomy. Carotid stenosis measurements (when applicable) are obtained utilizing NASCET criteria, using the distal internal carotid diameter as the denominator.  CONTRAST:  50mL OMNIPAQUE IOHEXOL 350 MG/ML SOLN  COMPARISON:  CT earlier same day.  FINDINGS: CT HEAD  Brain shows generalized atrophy with mild chronic small-vessel changes of the white matter. No sign of acute infarction, mass lesion, hemorrhage, hydrocephalus or extra-axial collection.  CTA NECK  Aortic arch: Mild atherosclerosis of the arch without aneurysm or dissection. Branching pattern of the brachiocephalic vessels from the arch is normal without origin stenosis.  Right carotid system: Common carotid artery widely patent to the bifurcation. There is advanced atherosclerotic disease affecting the proximal internal carotid artery. Minimal diameter in the bulb is 1.5 mm. Compared to a more distal cervical ICA diameter of 5 mm, this indicates 70% stenosis.  Left carotid system: Common carotid artery widely patent to the bifurcation region. Complex atherosclerotic disease of the ICA bulb with minimal diameter of 1 mm. Compared to a more distal cervical ICA diameter of 5 mm, this indicates an 80% stenosis.  Vertebral arteries:The left vertebral artery is dominant. There is 20% stenosis at the origin. The vessel is widely patent beyond that. The nondominant right vertebral artery is widely patent at its origin and through the cervical region.  Skeleton: Ordinary spondylosis  Other neck: No significant soft tissue lesion no upper chest lesion.  CTA HEAD  Anterior circulation: Internal carotid arteries are widely patent through the skullbase. There is atherosclerotic calcification in the siphon regions but no stenosis greater than 30-40%. Supra clinoid internal carotid arteries are widely patent. The anterior and middle cerebral vessels are patent  without proximal stenosis, aneurysm or vascular malformation. No missing branches are appreciated.  Posterior circulation: Both vertebral arteries  are widely patent through the foramen magnum to the basilar. No basilar stenosis. Posterior circulation branch vessels are patent.  Venous sinuses: Patent and normal  Anatomic variants: None significant  IMPRESSION: No CT or CTA evidence of acute intracranial infarction.  70% stenosis of the proximal right internal carotid artery.  80% stenosis of the proximal left internal carotid artery.  Atherosclerotic disease in the carotid siphon regions with maximal stenosis estimated at 30-40% on both sides.   Electronically Signed   By: Paulina Fusi M.D.   On: 10/20/2014 20:32   Dg Chest 2 View  10/20/2014   CLINICAL DATA:  Atrial fibrillation.  TIA.  EXAM: CHEST  2 VIEW  COMPARISON:  07/25/2014.  FINDINGS: Cardiac pacer with lead tip projected over right ventricle. Cardiomegaly with normal pulmonary vascularity. Low lung volumes with mild bibasilar atelectasis. No pleural effusion or pneumothorax. Diffuse degenerative change and osteopenia thoracic spine with stable compression fractures.  IMPRESSION: 1. Stable cardiomegaly.  No CHF.  Cardiac pacer stable position. 2. Low lung volumes with mild bibasilar subsegmental atelectasis.   Electronically Signed   By: Maisie Fus  Register   On: 10/20/2014 14:52   Ct Head Wo Contrast  10/20/2014   CLINICAL DATA:  Confusion, slurred speech.  EXAM: CT HEAD WITHOUT CONTRAST  TECHNIQUE: Contiguous axial images were obtained from the base of the skull through the vertex without intravenous contrast.  COMPARISON:  CT scan of September 20, 2014.  FINDINGS: Bony calvarium appears intact. Moderate diffuse cortical atrophy is noted. Diffuse ventricular dilatation is noted consistent with the degree of atrophy. Minimal chronic ischemic white matter disease is noted. No mass effect or midline shift is noted. There is no evidence of mass lesion,  hemorrhage or acute infarction.  IMPRESSION: Moderate diffuse cortical atrophy. Minimal chronic ischemic white matter disease. No acute intracranial abnormality seen. These results were called by telephone at the time of interpretation on 10/20/2014 at 9:24 am to Dr. Roseanne Reno, who verbally acknowledged these results.   Electronically Signed   By: Lupita Raider, M.D.   On: 10/20/2014 09:25   Ct Angio Neck W/cm &/or Wo/cm  10/20/2014   CLINICAL DATA:  Gait disturbance. Slurred speech. Seizures. Atrial fibrillation. Symptoms began today.  EXAM: CT ANGIOGRAPHY HEAD AND NECK  TECHNIQUE: Multidetector CT imaging of the head and neck was performed using the standard protocol during bolus administration of intravenous contrast. Multiplanar CT image reconstructions and MIPs were obtained to evaluate the vascular anatomy. Carotid stenosis measurements (when applicable) are obtained utilizing NASCET criteria, using the distal internal carotid diameter as the denominator.  CONTRAST:  50mL OMNIPAQUE IOHEXOL 350 MG/ML SOLN  COMPARISON:  CT earlier same day.  FINDINGS: CT HEAD  Brain shows generalized atrophy with mild chronic small-vessel changes of the white matter. No sign of acute infarction, mass lesion, hemorrhage, hydrocephalus or extra-axial collection.  CTA NECK  Aortic arch: Mild atherosclerosis of the arch without aneurysm or dissection. Branching pattern of the brachiocephalic vessels from the arch is normal without origin stenosis.  Right carotid system: Common carotid artery widely patent to the bifurcation. There is advanced atherosclerotic disease affecting the proximal internal carotid artery. Minimal diameter in the bulb is 1.5 mm. Compared to a more distal cervical ICA diameter of 5 mm, this indicates 70% stenosis.  Left carotid system: Common carotid artery widely patent to the bifurcation region. Complex atherosclerotic disease of the ICA bulb with minimal diameter of 1 mm. Compared to a more distal  cervical ICA diameter of 5  mm, this indicates an 80% stenosis.  Vertebral arteries:The left vertebral artery is dominant. There is 20% stenosis at the origin. The vessel is widely patent beyond that. The nondominant right vertebral artery is widely patent at its origin and through the cervical region.  Skeleton: Ordinary spondylosis  Other neck: No significant soft tissue lesion no upper chest lesion.  CTA HEAD  Anterior circulation: Internal carotid arteries are widely patent through the skullbase. There is atherosclerotic calcification in the siphon regions but no stenosis greater than 30-40%. Supra clinoid internal carotid arteries are widely patent. The anterior and middle cerebral vessels are patent without proximal stenosis, aneurysm or vascular malformation. No missing branches are appreciated.  Posterior circulation: Both vertebral arteries are widely patent through the foramen magnum to the basilar. No basilar stenosis. Posterior circulation branch vessels are patent.  Venous sinuses: Patent and normal  Anatomic variants: None significant  IMPRESSION: No CT or CTA evidence of acute intracranial infarction.  70% stenosis of the proximal right internal carotid artery.  80% stenosis of the proximal left internal carotid artery.  Atherosclerotic disease in the carotid siphon regions with maximal stenosis estimated at 30-40% on both sides.   Electronically Signed   By: Paulina Fusi M.D.   On: 10/20/2014 20:32   Dg Chest Port 1 View  11/04/2014   CLINICAL DATA:  Fever.  EXAM: PORTABLE CHEST 1 VIEW  COMPARISON:  10/20/2014 chest radiograph  FINDINGS: Single lead left subclavian pacemaker is stable in configuration. Stable cardiomediastinal silhouette with mild cardiomegaly. No pneumothorax. No pleural effusion. Mild curvilinear opacity at the left costophrenic angle, favor atelectasis. No pulmonary edema. No additional focal lung opacity.  IMPRESSION: Mild curvilinear opacity at the left costophrenic angle,  favor atelectasis.  Stable mild cardiomegaly without pulmonary edema.   Electronically Signed   By: Delbert Phenix M.D.   On: 11/04/2014 18:23     CBC  Recent Labs Lab 11/04/14 1710 11/05/14 0445 11/06/14 0629  WBC 9.7 11.3* 9.3  HGB 12.1* 10.4* 10.7*  HCT 35.6* 31.0* 31.5*  PLT 205 162 170  MCV 83.6 85.2 85.1  MCH 28.4 28.6 28.9  MCHC 34.0 33.5 34.0  RDW 14.7 15.2 15.3  LYMPHSABS 0.8  --   --   MONOABS 0.6  --   --   EOSABS 0.0  --   --   BASOSABS 0.0  --   --     Chemistries   Recent Labs Lab 11/04/14 1710 11/05/14 0445 11/06/14 0629 11/07/14 0610  NA 133* 130* 132* 134*  K 3.8 3.8 3.8 3.7  CL 103 102 104 108  CO2 19* 21* 23 22  GLUCOSE 111* 131* 99 92  BUN 13 14 14 14   CREATININE 1.25* 1.29* 1.21 1.13  CALCIUM 8.6* 7.9* 7.9* 7.7*  AST 36  --   --   --   ALT 19  --   --   --   ALKPHOS 70  --   --   --   BILITOT 1.2  --   --   --    ------------------------------------------------------------------------------------------------------------------ estimated creatinine clearance is 48.7 mL/min (by C-G formula based on Cr of 1.13). ------------------------------------------------------------------------------------------------------------------ No results for input(s): HGBA1C in the last 72 hours. ------------------------------------------------------------------------------------------------------------------ No results for input(s): CHOL, HDL, LDLCALC, TRIG, CHOLHDL, LDLDIRECT in the last 72 hours. ------------------------------------------------------------------------------------------------------------------ No results for input(s): TSH, T4TOTAL, T3FREE, THYROIDAB in the last 72 hours.  Invalid input(s): FREET3 ------------------------------------------------------------------------------------------------------------------ No results for input(s): VITAMINB12, FOLATE, FERRITIN, TIBC, IRON, RETICCTPCT in the last  72 hours.  Coagulation profile No results for  input(s): INR, PROTIME in the last 168 hours.  No results for input(s): DDIMER in the last 72 hours.  Cardiac Enzymes No results for input(s): CKMB, TROPONINI, MYOGLOBIN in the last 168 hours.  Invalid input(s): CK ------------------------------------------------------------------------------------------------------------------ Invalid input(s): POCBNP     Time Spent in minutes   35 minutes   Araceli Arango M.D on 11/07/2014 at 10:55 AM  Between 7am to 7pm - Pager - [phone]  After 7pm go to [email or web address] - password [facility]  [facility]   Office  [phone]

## 2014-11-08 ENCOUNTER — Inpatient Hospital Stay (HOSPITAL_COMMUNITY): Payer: Medicare Other

## 2014-11-08 DIAGNOSIS — G40909 Epilepsy, unspecified, not intractable, without status epilepticus: Secondary | ICD-10-CM

## 2014-11-08 DIAGNOSIS — Z950 Presence of cardiac pacemaker: Secondary | ICD-10-CM

## 2014-11-08 DIAGNOSIS — R339 Retention of urine, unspecified: Secondary | ICD-10-CM

## 2014-11-08 DIAGNOSIS — G4733 Obstructive sleep apnea (adult) (pediatric): Secondary | ICD-10-CM

## 2014-11-08 LAB — CBC
HEMATOCRIT: 29.8 % — AB (ref 39.0–52.0)
HEMOGLOBIN: 10.3 g/dL — AB (ref 13.0–17.0)
MCH: 28.9 pg (ref 26.0–34.0)
MCHC: 34.6 g/dL (ref 30.0–36.0)
MCV: 83.7 fL (ref 78.0–100.0)
Platelets: 182 10*3/uL (ref 150–400)
RBC: 3.56 MIL/uL — AB (ref 4.22–5.81)
RDW: 15.2 % (ref 11.5–15.5)
WBC: 4.5 10*3/uL (ref 4.0–10.5)

## 2014-11-08 LAB — BASIC METABOLIC PANEL
ANION GAP: 9 (ref 5–15)
BUN: 11 mg/dL (ref 6–20)
CALCIUM: 7.6 mg/dL — AB (ref 8.9–10.3)
CHLORIDE: 105 mmol/L (ref 101–111)
CO2: 20 mmol/L — AB (ref 22–32)
Creatinine, Ser: 1.04 mg/dL (ref 0.61–1.24)
GFR calc non Af Amer: >60 mL/min (ref >60)
Glucose, Bld: 89 mg/dL (ref 65–99)
POTASSIUM: 3.5 mmol/L (ref 3.5–5.1)
Sodium: 134 mmol/L — ABNORMAL LOW (ref 135–145)

## 2014-11-08 LAB — URINE CULTURE

## 2014-11-08 MED ORDER — CIPROFLOXACIN HCL 500 MG PO TABS
500.0000 mg | ORAL_TABLET | Freq: Two times a day (BID) | ORAL | Status: DC
  Administered 2014-11-08 – 2014-11-09 (×3): 500 mg via ORAL
  Filled 2014-11-08 (×3): qty 1

## 2014-11-08 MED ORDER — TAMSULOSIN HCL 0.4 MG PO CAPS
0.8000 mg | ORAL_CAPSULE | Freq: Every day | ORAL | Status: DC
  Administered 2014-11-09: 0.8 mg via ORAL
  Filled 2014-11-08: qty 2

## 2014-11-08 MED ORDER — MEGESTROL ACETATE 400 MG/10ML PO SUSP
400.0000 mg | Freq: Every day | ORAL | Status: DC
  Administered 2014-11-08 – 2014-11-09 (×2): 400 mg via ORAL
  Filled 2014-11-08 (×3): qty 10

## 2014-11-08 MED ORDER — FINASTERIDE 5 MG PO TABS
5.0000 mg | ORAL_TABLET | Freq: Every day | ORAL | Status: DC
  Administered 2014-11-09: 5 mg via ORAL
  Filled 2014-11-08: qty 1

## 2014-11-08 MED ORDER — LEVETIRACETAM 500 MG PO TABS
500.0000 mg | ORAL_TABLET | Freq: Every day | ORAL | Status: DC
  Administered 2014-11-08: 500 mg via ORAL
  Filled 2014-11-08: qty 1

## 2014-11-08 MED ORDER — FUROSEMIDE 20 MG PO TABS
20.0000 mg | ORAL_TABLET | Freq: Every day | ORAL | Status: DC
  Administered 2014-11-09: 20 mg via ORAL
  Filled 2014-11-08: qty 1

## 2014-11-08 NOTE — Progress Notes (Signed)
Pt's foley catheter irrigated with of sterile water. Urine still pinkish red but no clots observed, pt reassured. Obasogie-Asidi, Raylyn Speckman Efe

## 2014-11-08 NOTE — Telephone Encounter (Signed)
Rn cal [facility] at [employer] to make sure they receive the prior authorization code from the patients insurance. Shanda Bumps wrote a note on 11-07-14 stating authorization code and approval for vimpat.. [facility] stated it was filled.

## 2014-11-08 NOTE — Progress Notes (Signed)
Patient Demographics  Oscar Santiago, is a 81 y.o. male, DOB - [birth year], ZOX:[id number]  Admit date - 11/04/2014   Admitting Physician Rolly Salter, MD  Outpatient Primary MD for the patient is [name], DO  LOS - 4   Chief Complaint  Patient presents with  . Nausea  . Vomiting  . Weakness  . Urinary Retention       Admission history of present illness/brief narrative: 81 y.o. male withhistory of epilepsy, hypertension, hypothyroidism, OSA on CPAP, chronic systolic CHF (EF 81%), chronic A. fib not on anticoagulation, heart block with pacer, CKD stage III, BPH and recent TIA who presents with a febrile UTI.  recently admission for TIA (left hemiparesis, resolved). underwent right CEA. had urinary retention and coud catheter was placed by urology, seen by urology 1 day prior to admission, coud catheter was discontinued, presents with fever 101, tachycardia, and recurrence of urinary retention, coud catheter was inserted by Dr. Annabell Howells in ED. Patient clinically improving on Rocephin, but have worsening hematuria, aspirin and Lovenox has been held starting from 11/07/14.  Subjective:   Oscar Santiago sitting in chair, denies pain, no fever, foley with dark color urine, on frank blood, reported poor appetite, generalized weakness, grandaughter in room.  Assessment & Plan    Principal Problem:   UTI (urinary tract infection) Active Problems:   Chronic atrial fibrillation (CHADVASc = 5)   Hypertension   Hyperlipidemia   OSA (obstructive sleep apnea)   Cardiac pacemaker in situ   Chronic systolic dysfunction of left ventricle   CKD (chronic kidney disease), stage III   Seizure disorder (HCC)   TIA (transient ischemic attack)   Fever   UTI (lower urinary tract infection)   Retention of urine  Urinary tract infection  - Continue ceftriaxone 1 g day 4 out of 7, will be able to switch to oral on  discharge. - Urine cultures growing klebsiella pneumoniae sensitive to Rocephin and pseudomona that is sensitive to cipro. -Follow up blood cultures : No growth to date   Urinary retention - Seen by neurology, failed voiding trial as an outpatient, coud catheter was reinserted upon admission by Dr. Annabell Howells,  -increase Flomax,start proscar - follow up in 1-2 weeks as an outpatient with urology, Dr. Patsi Sears  Hematuria - continue hold aspirin and Lovenox, start on Foley flushes, urology following, awaiting notes from urology   Atrial fibrillation with pacemaker:  - Rate controlled without meds  -  He is not on an anticoagulant at baseline because of a previous GI bleed and his concerns about anticoagulation. After his recent TIA, Eliquis was recommended, not started because of hematuria during previous hospital stay , unfortunately having recurrence of significant hematuria this hospital stay as well , currently holding aspirin and Lovenox for that .   Hypertension:  - Acceptable, Continue ARB.  OSA:  -Continue home CPAP  CKD stage III:  - at baseline. - Continue ARB   Seizures - Continue home Keppra  Recent TIA and s/p CEA:  - No complaints of focal weakness, face symmetric. - Full dose aspirin currently on hold giving his hematuria    GERD:  -Continue home H2 RA   Hyponatremia: - Most likely related to volume depletion, improving with IV  fluids.  Code Status: Full  Family Communication:  Patient and granddaughter in room  Disposition Plan: Home when medically stable   Procedures  None   Consults   urology   Medications  Scheduled Meds: . ciprofloxacin  500 mg Oral BID  . dorzolamide-timolol  1 drop Both Eyes BID  . [START ON 11/09/2014] finasteride  5 mg Oral Daily  . fluticasone  1 spray Each Nare Daily  . irbesartan  300 mg Oral Daily  . latanoprost  1 drop Both Eyes QHS  . levETIRAcetam  250 mg Oral q morning - 10a  . levETIRAcetam  500 mg  Oral QHS  . levothyroxine  25 mcg Oral QAC breakfast  . megestrol  400 mg Oral Daily  . pravastatin  20 mg Oral q1800  . [START ON 11/09/2014] tamsulosin  0.8 mg Oral QPC breakfast  . vitamin B-12  1,000 mcg Oral Daily  . zolpidem  2.5 mg Oral QHS   Continuous Infusions: . sodium chloride 75 mL/hr at 11/07/14 2052   PRN Meds:.famotidine, guaiFENesin, ondansetron **OR** ondansetron (ZOFRAN) IV  DVT Prophylaxis  Lovenox -  Lab Results  Component Value Date   PLT 182 11/08/2014    Antibiotics    Anti-infectives    Start     Dose/Rate Route Frequency Ordered Stop   11/08/14 1145  ciprofloxacin (CIPRO) tablet 500 mg     500 mg Oral 2 times daily 11/08/14 1132     11/05/14 2000  cefTRIAXone (ROCEPHIN) 1 g in dextrose 5 % 50 mL IVPB  Status:  Discontinued     1 g 100 mL/hr over 30 Minutes Intravenous Every 24 hours 11/04/14 2025 11/08/14 1132   11/04/14 1900  cefTRIAXone (ROCEPHIN) 1 g in dextrose 5 % 50 mL IVPB     1 g 100 mL/hr over 30 Minutes Intravenous  Once 11/04/14 1855 11/04/14 2016          Objective:   Filed Vitals:   11/07/14 0400 11/07/14 2202 11/08/14 0814 11/08/14 1232  BP: 149/59 152/67 146/81 143/76  Pulse: 56 66 64 52  Temp: 98.2 F (36.8 C) 98.2 F (36.8 C) 98.3 F (36.8 C) 98 F (36.7 C)  TempSrc: Oral Oral Oral Oral  Resp: Height:      Weight:      SpO2: 99% 98% 100% 97%    Wt Readings from Last 3 Encounters:  11/04/14 186 lb 11.7 oz (84.7 kg)  10/28/14 195 lb 8.8 oz (88.7 kg)  10/13/14 187 lb (84.823 kg)     Intake/Output Summary (Last 24 hours) at 11/08/14 1755 Last data filed at 11/08/14 1717  Gross per 24 hour  Intake    458 ml  Output    451 ml  Net      7 ml     Physical Exam  Awake Alert, Supple Neck,No JVD,  Symmetrical Chest wall movement, Good air movement bilaterally,  RRR,No Gallops,Rubs or new Murmurs, No Parasternal Heave +ve B.Sounds, Abd Soft, No tenderness, No organomegaly appriciated, Hematuria  color is more dark day, appears to be worsening . No Cyanosis, Clubbing or edema, No new Rash or bruise     Data Review   Micro Results Recent Results (from the past 240 hour(s))  Culture, blood (routine x 2)     Status: None (Preliminary result)   Collection Time: 11/04/14  4:23 PM  Result Value Ref Range Status   Specimen Description BLOOD RIGHT ANTECUBITAL  Final  Special Requests BOTTLES DRAWN AEROBIC AND ANAEROBIC 5 CC  Final   Culture NO GROWTH 4 DAYS  Final   Report Status PENDING  Incomplete  Culture, blood (routine x 2)     Status: None (Preliminary result)   Collection Time: 11/04/14  4:30 PM  Result Value Ref Range Status   Specimen Description BLOOD RIGHT HAND  Final   Special Requests BOTTLES DRAWN AEROBIC AND ANAEROBIC 5CC  Final   Culture NO GROWTH 4 DAYS  Final   Report Status PENDING  Incomplete  Urine culture     Status: None   Collection Time: 11/04/14  4:42 PM  Result Value Ref Range Status   Specimen Description URINE, CLEAN CATCH  Final   Special Requests NONE  Final   Culture   Final    >=100,000 COLONIES/mL KLEBSIELLA PNEUMONIAE >=100,000 COLONIES/mL PSEUDOMONAS AERUGINOSA    Report Status 11/08/2014 FINAL  Final   Organism ID, Bacteria KLEBSIELLA PNEUMONIAE  Final   Organism ID, Bacteria PSEUDOMONAS AERUGINOSA  Final      Susceptibility   Klebsiella pneumoniae - MIC*    AMPICILLIN >=32 RESISTANT Resistant     CEFAZOLIN <=4 SENSITIVE Sensitive     CEFTRIAXONE <=1 SENSITIVE Sensitive     CIPROFLOXACIN <=0.25 SENSITIVE Sensitive     GENTAMICIN <=1 SENSITIVE Sensitive     IMIPENEM <=0.25 SENSITIVE Sensitive     NITROFURANTOIN 32 SENSITIVE Sensitive     TRIMETH/SULFA <=20 SENSITIVE Sensitive     AMPICILLIN/SULBACTAM 8 SENSITIVE Sensitive     PIP/TAZO <=4 SENSITIVE Sensitive     * >=100,000 COLONIES/mL KLEBSIELLA PNEUMONIAE   Pseudomonas aeruginosa - MIC*    CEFTAZIDIME 4 SENSITIVE Sensitive     CIPROFLOXACIN <=0.25 SENSITIVE Sensitive      GENTAMICIN 2 SENSITIVE Sensitive     IMIPENEM 2 SENSITIVE Sensitive     PIP/TAZO 8 SENSITIVE Sensitive     CEFEPIME 2 SENSITIVE Sensitive     * >=100,000 COLONIES/mL PSEUDOMONAS AERUGINOSA    Radiology Reports Ct Angio Head W/cm &/or Wo Cm  10/20/2014   CLINICAL DATA:  Gait disturbance. Slurred speech. Seizures. Atrial fibrillation. Symptoms began today.  EXAM: CT ANGIOGRAPHY HEAD AND NECK  TECHNIQUE: Multidetector CT imaging of the head and neck was performed using the standard protocol during bolus administration of intravenous contrast. Multiplanar CT image reconstructions and MIPs were obtained to evaluate the vascular anatomy. Carotid stenosis measurements (when applicable) are obtained utilizing NASCET criteria, using the distal internal carotid diameter as the denominator.  CONTRAST:  50mL OMNIPAQUE IOHEXOL 350 MG/ML SOLN  COMPARISON:  CT earlier same day.  FINDINGS: CT HEAD  Brain shows generalized atrophy with mild chronic small-vessel changes of the white matter. No sign of acute infarction, mass lesion, hemorrhage, hydrocephalus or extra-axial collection.  CTA NECK  Aortic arch: Mild atherosclerosis of the arch without aneurysm or dissection. Branching pattern of the brachiocephalic vessels from the arch is normal without origin stenosis.  Right carotid system: Common carotid artery widely patent to the bifurcation. There is advanced atherosclerotic disease affecting the proximal internal carotid artery. Minimal diameter in the bulb is 1.5 mm. Compared to a more distal cervical ICA diameter of 5 mm, this indicates 70% stenosis.  Left carotid system: Common carotid artery widely patent to the bifurcation region. Complex atherosclerotic disease of the ICA bulb with minimal diameter of 1 mm. Compared to a more distal cervical ICA diameter of 5 mm, this indicates an 80% stenosis.  Vertebral arteries:The left vertebral artery is dominant. There  is 20% stenosis at the origin. The vessel is widely  patent beyond that. The nondominant right vertebral artery is widely patent at its origin and through the cervical region.  Skeleton: Ordinary spondylosis  Other neck: No significant soft tissue lesion no upper chest lesion.  CTA HEAD  Anterior circulation: Internal carotid arteries are widely patent through the skullbase. There is atherosclerotic calcification in the siphon regions but no stenosis greater than 30-40%. Supra clinoid internal carotid arteries are widely patent. The anterior and middle cerebral vessels are patent without proximal stenosis, aneurysm or vascular malformation. No missing branches are appreciated.  Posterior circulation: Both vertebral arteries are widely patent through the foramen magnum to the basilar. No basilar stenosis. Posterior circulation branch vessels are patent.  Venous sinuses: Patent and normal  Anatomic variants: None significant  IMPRESSION: No CT or CTA evidence of acute intracranial infarction.  70% stenosis of the proximal right internal carotid artery.  80% stenosis of the proximal left internal carotid artery.  Atherosclerotic disease in the carotid siphon regions with maximal stenosis estimated at 30-40% on both sides.   Electronically Signed   By: Paulina Fusi M.D.   On: 10/20/2014 20:32   Dg Chest 2 View  10/20/2014   CLINICAL DATA:  Atrial fibrillation.  TIA.  EXAM: CHEST  2 VIEW  COMPARISON:  07/25/2014.  FINDINGS: Cardiac pacer with lead tip projected over right ventricle. Cardiomegaly with normal pulmonary vascularity. Low lung volumes with mild bibasilar atelectasis. No pleural effusion or pneumothorax. Diffuse degenerative change and osteopenia thoracic spine with stable compression fractures.  IMPRESSION: 1. Stable cardiomegaly.  No CHF.  Cardiac pacer stable position. 2. Low lung volumes with mild bibasilar subsegmental atelectasis.   Electronically Signed   By: Maisie Fus  Register   On: 10/20/2014 14:52   Ct Head Wo Contrast  10/20/2014   CLINICAL DATA:   Confusion, slurred speech.  EXAM: CT HEAD WITHOUT CONTRAST  TECHNIQUE: Contiguous axial images were obtained from the base of the skull through the vertex without intravenous contrast.  COMPARISON:  CT scan of September 20, 2014.  FINDINGS: Bony calvarium appears intact. Moderate diffuse cortical atrophy is noted. Diffuse ventricular dilatation is noted consistent with the degree of atrophy. Minimal chronic ischemic white matter disease is noted. No mass effect or midline shift is noted. There is no evidence of mass lesion, hemorrhage or acute infarction.  IMPRESSION: Moderate diffuse cortical atrophy. Minimal chronic ischemic white matter disease. No acute intracranial abnormality seen. These results were called by telephone at the time of interpretation on 10/20/2014 at 9:24 am to Dr. Roseanne Reno, who verbally acknowledged these results.   Electronically Signed   By: Lupita Raider, M.D.   On: 10/20/2014 09:25   Ct Angio Neck W/cm &/or Wo/cm  10/20/2014   CLINICAL DATA:  Gait disturbance. Slurred speech. Seizures. Atrial fibrillation. Symptoms began today.  EXAM: CT ANGIOGRAPHY HEAD AND NECK  TECHNIQUE: Multidetector CT imaging of the head and neck was performed using the standard protocol during bolus administration of intravenous contrast. Multiplanar CT image reconstructions and MIPs were obtained to evaluate the vascular anatomy. Carotid stenosis measurements (when applicable) are obtained utilizing NASCET criteria, using the distal internal carotid diameter as the denominator.  CONTRAST:  50mL OMNIPAQUE IOHEXOL 350 MG/ML SOLN  COMPARISON:  CT earlier same day.  FINDINGS: CT HEAD  Brain shows generalized atrophy with mild chronic small-vessel changes of the white matter. No sign of acute infarction, mass lesion, hemorrhage, hydrocephalus or extra-axial collection.  CTA NECK  Aortic arch: Mild atherosclerosis of the arch without aneurysm or dissection. Branching pattern of the brachiocephalic vessels from the arch  is normal without origin stenosis.  Right carotid system: Common carotid artery widely patent to the bifurcation. There is advanced atherosclerotic disease affecting the proximal internal carotid artery. Minimal diameter in the bulb is 1.5 mm. Compared to a more distal cervical ICA diameter of 5 mm, this indicates 70% stenosis.  Left carotid system: Common carotid artery widely patent to the bifurcation region. Complex atherosclerotic disease of the ICA bulb with minimal diameter of 1 mm. Compared to a more distal cervical ICA diameter of 5 mm, this indicates an 80% stenosis.  Vertebral arteries:The left vertebral artery is dominant. There is 20% stenosis at the origin. The vessel is widely patent beyond that. The nondominant right vertebral artery is widely patent at its origin and through the cervical region.  Skeleton: Ordinary spondylosis  Other neck: No significant soft tissue lesion no upper chest lesion.  CTA HEAD  Anterior circulation: Internal carotid arteries are widely patent through the skullbase. There is atherosclerotic calcification in the siphon regions but no stenosis greater than 30-40%. Supra clinoid internal carotid arteries are widely patent. The anterior and middle cerebral vessels are patent without proximal stenosis, aneurysm or vascular malformation. No missing branches are appreciated.  Posterior circulation: Both vertebral arteries are widely patent through the foramen magnum to the basilar. No basilar stenosis. Posterior circulation branch vessels are patent.  Venous sinuses: Patent and normal  Anatomic variants: None significant  IMPRESSION: No CT or CTA evidence of acute intracranial infarction.  70% stenosis of the proximal right internal carotid artery.  80% stenosis of the proximal left internal carotid artery.  Atherosclerotic disease in the carotid siphon regions with maximal stenosis estimated at 30-40% on both sides.   Electronically Signed   By: Paulina Fusi M.D.   On:  10/20/2014 20:32   Dg Chest Port 1 View  11/04/2014   CLINICAL DATA:  Fever.  EXAM: PORTABLE CHEST 1 VIEW  COMPARISON:  10/20/2014 chest radiograph  FINDINGS: Single lead left subclavian pacemaker is stable in configuration. Stable cardiomediastinal silhouette with mild cardiomegaly. No pneumothorax. No pleural effusion. Mild curvilinear opacity at the left costophrenic angle, favor atelectasis. No pulmonary edema. No additional focal lung opacity.  IMPRESSION: Mild curvilinear opacity at the left costophrenic angle, favor atelectasis.  Stable mild cardiomegaly without pulmonary edema.   Electronically Signed   By: Delbert Phenix M.D.   On: 11/04/2014 18:23     CBC  Recent Labs Lab 11/04/14 1710 11/05/14 0445 11/06/14 0629 11/08/14 0607  WBC 9.7 11.3* 9.3 4.5  HGB 12.1* 10.4* 10.7* 10.3*  HCT 35.6* 31.0* 31.5* 29.8*  PLT 205 162 170 182  MCV 83.6 85.2 85.1 83.7  MCH 28.4 28.6 28.9 28.9  MCHC 34.0 33.5 34.0 34.6  RDW 14.7 15.2 15.3 15.2  LYMPHSABS 0.8  --   --   --   MONOABS 0.6  --   --   --   EOSABS 0.0  --   --   --   BASOSABS 0.0  --   --   --     Chemistries   Recent Labs Lab 11/04/14 1710 11/05/14 0445 11/06/14 0629 11/07/14 0610 11/08/14 0607  NA 133* 130* 132* 134* 134*  K 3.8 3.8 3.8 3.7 3.5  CL 103 102 104 108 105  CO2 19* 21* 23 22 20*  GLUCOSE 111* 131* 99 92 89  BUN 13 14 14  14 11  CREATININE 1.25* 1.29* 1.21 1.13 1.04  CALCIUM 8.6* 7.9* 7.9* 7.7* 7.6*  AST 36  --   --   --   --   ALT 19  --   --   --   --   ALKPHOS 70  --   --   --   --   BILITOT 1.2  --   --   --   --    ------------------------------------------------------------------------------------------------------------------ estimated creatinine clearance is 52.9 mL/min (by C-G formula based on Cr of 1.04). ------------------------------------------------------------------------------------------------------------------ No results for input(s): HGBA1C in the last 72  hours. ------------------------------------------------------------------------------------------------------------------ No results for input(s): CHOL, HDL, LDLCALC, TRIG, CHOLHDL, LDLDIRECT in the last 72 hours. ------------------------------------------------------------------------------------------------------------------ No results for input(s): TSH, T4TOTAL, T3FREE, THYROIDAB in the last 72 hours.  Invalid input(s): FREET3 ------------------------------------------------------------------------------------------------------------------ No results for input(s): VITAMINB12, FOLATE, FERRITIN, TIBC, IRON, RETICCTPCT in the last 72 hours.  Coagulation profile No results for input(s): INR, PROTIME in the last 168 hours.  No results for input(s): DDIMER in the last 72 hours.  Cardiac Enzymes No results for input(s): CKMB, TROPONINI, MYOGLOBIN in the last 168 hours.  Invalid input(s): CK ------------------------------------------------------------------------------------------------------------------ Invalid input(s): POCBNP     Time Spent in minutes   35 minutes   Oscar Santiago M.D on 11/08/2014 at 5:55 PM  Between 7am to 7pm - Pager - [phone]  After 7pm go to [email or web address] - password [facility]  [facility]   Office  [phone]

## 2014-11-08 NOTE — Evaluation (Signed)
Clinical/Bedside Swallow Evaluation Patient Details  Name: Oscar Santiago MRN: [id number] Date of Birth: May 31, 1925  Today's Date: 11/08/2014 Time: SLP Start Time (ACUTE ONLY): 1719 SLP Stop Time (ACUTE ONLY): 1728 SLP Time Calculation (min) (ACUTE ONLY): 9 min  Past Medical History:  Past Medical History  Diagnosis Date  . Hypertension   . Seizure disorder (HCC)   . Glaucoma   . Hyperlipidemia   . Osteopenia   . Compression fracture of spine (HCC)     T 10 and T11  . Gout   . OSA (obstructive sleep apnea)     on CPAP therapy  . Hypothyroid   . Inguinal hernia   . Hemorrhoid   . Chronic systolic dysfunction of left ventricle     EF 30-35% by echo 10/10/2009  . Major depression (HCC)   . History of GI bleed     from coumadin  . MR (mitral regurgitation)   . Hearing loss   . Dizziness     intermittent  . Kidney stone   . CKD (chronic kidney disease), stage III   . Permanent atrial fibrillation (HCC)     refuses coumadin due to history of GI bleed  . Decreased appetite 03/02/12  . Weight loss 03/02/12  . Complete heart block (HCC)   . CHF (congestive heart failure) (HCC)   . Seizures (HCC)    Past Surgical History:  Past Surgical History  Procedure Laterality Date  . Transurethral resection of prostate    . Cardiac catheterization  2002  . Pacemaker insertion  07/28/06    VVI pacemaker for complete heart block by Dr Amil Amen  . Inguinal hernia repair    . Hemorrhoid surgery    . Colonoscopy with propofol N/A 05/01/2014    Procedure: COLONOSCOPY WITH PROPOFOL;  Surgeon: [name], MD;  Location: [facility]COPY;  Service: Endoscopy;  Laterality: N/A;  . Endarterectomy Right 10/23/2014    Procedure: ENDARTERECTOMY CAROTID;  Surgeon: Larina Earthly, MD;  Location: [facility];  Service: Vascular;  Laterality: Right;   HPI:  Pt adm with UTI after recent adm for TIA. PMH - epilepsy, hypertension, hypothyroidism, OSA on CPAP, chronic systolic CHF (EF 08%), chronic A. fib not on  anticoagulation, heart block with pacer, CKD stage III   Assessment / Plan / Recommendation Clinical Impression  Pt's oropharyngeal swallow appears WFL. Pt is without subjective complaitns, although does report that he is always cautious about not taking too much at a time. Recommend continuing regular diet and thin liquids, SLP to sign off.    Aspiration Risk  Mild    Diet Recommendation Age appropriate regular solids;Thin   Medication Administration: Whole meds with liquid Compensations: Slow rate;Small sips/bites    Other  Recommendations Oral Care Recommendations: Oral care BID   Follow Up Recommendations       Frequency and Duration        Pertinent Vitals/Pain n/a    SLP Swallow Goals     Swallow Study Prior Functional Status       General Other Pertinent Information: Pt adm with UTI after recent adm for TIA. PMH - epilepsy, hypertension, hypothyroidism, OSA on CPAP, chronic systolic CHF (EF 65%), chronic A. fib not on anticoagulation, heart block with pacer, CKD stage III Type of Study: Bedside swallow evaluation Previous Swallow Assessment: none in chart Diet Prior to this Study: Regular;Thin liquids Temperature Spikes Noted: No Respiratory Status: Room air History of Recent Intubation: No Behavior/Cognition: Alert;Cooperative;Pleasant mood;Other (Comment) (HOH) Oral Cavity -  Dentition: Adequate natural dentition/normal for age Self-Feeding Abilities: Able to feed self Patient Positioning: Upright in bed Baseline Vocal Quality: Normal    Oral/Motor/Sensory Function Overall Oral Motor/Sensory Function: Appears within functional limits for tasks assessed   Ice Chips Ice chips: Not tested   Thin Liquid Thin Liquid: Within functional limits Presentation: Self Fed;Straw    Nectar Thick Nectar Thick Liquid: Not tested   Honey Thick Honey Thick Liquid: Not tested   Puree Puree: Within functional limits Presentation: Spoon;Self Fed   Solid    Solid: Within  functional limits Presentation: Self Fed      Maxcine Ham, M.A. CCC-SLP [phone]  Maxcine Ham 11/08/2014,5:56 PM

## 2014-11-08 NOTE — Progress Notes (Signed)
Patient has his home CPAP in room, bedside, and ready for use.  He places on himself when ready for bed with help from family members.

## 2014-11-08 NOTE — Consult Note (Signed)
[name] 

## 2014-11-08 NOTE — Telephone Encounter (Signed)
LV vm for patients granddaughter [name] that her grandfathers medication is ready for pick up.

## 2014-11-09 DIAGNOSIS — R319 Hematuria, unspecified: Secondary | ICD-10-CM

## 2014-11-09 DIAGNOSIS — I10 Essential (primary) hypertension: Secondary | ICD-10-CM

## 2014-11-09 DIAGNOSIS — I482 Chronic atrial fibrillation: Secondary | ICD-10-CM

## 2014-11-09 DIAGNOSIS — I519 Heart disease, unspecified: Secondary | ICD-10-CM

## 2014-11-09 DIAGNOSIS — N183 Chronic kidney disease, stage 3 (moderate): Secondary | ICD-10-CM

## 2014-11-09 DIAGNOSIS — N390 Urinary tract infection, site not specified: Secondary | ICD-10-CM

## 2014-11-09 LAB — CULTURE, BLOOD (ROUTINE X 2)
CULTURE: NO GROWTH
Culture: NO GROWTH

## 2014-11-09 LAB — COMPREHENSIVE METABOLIC PANEL
ALBUMIN: 2.4 g/dL — AB (ref 3.5–5.0)
ALK PHOS: 61 U/L (ref 38–126)
ALT: 35 U/L (ref 17–63)
ANION GAP: 5 (ref 5–15)
AST: 54 U/L — ABNORMAL HIGH (ref 15–41)
BUN: 11 mg/dL (ref 6–20)
CO2: 23 mmol/L (ref 22–32)
Calcium: 8 mg/dL — ABNORMAL LOW (ref 8.9–10.3)
Chloride: 107 mmol/L (ref 101–111)
Creatinine, Ser: 1.07 mg/dL (ref 0.61–1.24)
GFR calc Af Amer: >60 mL/min (ref >60)
GFR calc non Af Amer: >60 mL/min (ref >60)
GLUCOSE: 97 mg/dL (ref 65–99)
POTASSIUM: 3.7 mmol/L (ref 3.5–5.1)
SODIUM: 135 mmol/L (ref 135–145)
Total Bilirubin: 0.4 mg/dL (ref 0.3–1.2)
Total Protein: 5.7 g/dL — ABNORMAL LOW (ref 6.5–8.1)

## 2014-11-09 LAB — CBC
HCT: 31.8 % — ABNORMAL LOW (ref 39.0–52.0)
HEMOGLOBIN: 10.7 g/dL — AB (ref 13.0–17.0)
MCH: 28 pg (ref 26.0–34.0)
MCHC: 33.6 g/dL (ref 30.0–36.0)
MCV: 83.2 fL (ref 78.0–100.0)
Platelets: 205 10*3/uL (ref 150–400)
RBC: 3.82 MIL/uL — ABNORMAL LOW (ref 4.22–5.81)
RDW: 15.1 % (ref 11.5–15.5)
WBC: 4.4 10*3/uL (ref 4.0–10.5)

## 2014-11-09 MED ORDER — ACETAMINOPHEN 500 MG PO TABS: 500.0000 mg | ORAL_TABLET | Freq: Three times a day (TID) | ORAL | Status: AC | PRN

## 2014-11-09 MED ORDER — CIPROFLOXACIN HCL 500 MG PO TABS: 500.0000 mg | ORAL_TABLET | Freq: Two times a day (BID) | ORAL | Status: DC

## 2014-11-09 MED ORDER — MEGESTROL ACETATE 400 MG/10ML PO SUSP: 400.0000 mg | Freq: Every day | ORAL | Status: DC

## 2014-11-09 MED ORDER — FINASTERIDE 5 MG PO TABS: 5.0000 mg | ORAL_TABLET | Freq: Every day | ORAL | Status: DC

## 2014-11-09 MED ORDER — TAMSULOSIN HCL 0.4 MG PO CAPS: 0.8000 mg | ORAL_CAPSULE | Freq: Every day | ORAL | Status: DC

## 2014-11-09 NOTE — Progress Notes (Addendum)
Oscar Santiago to be be D/C'd Home per MD order. Discussed with the patient and all questions fully answered.  VSS, Skin clean, dry and intact without evidence of skin break down, no evidence of skin tears noted. IV catheter discontinued intact. Site without signs and symptoms of complications. Dressing and pressure applied.  An After Visit Summary was printed and given to the patient. Prescriptions sent to primary pharmacy at [facility]  D/c education completed with patient/family including follow up instructions, medication list, d/c activities limitations if indicated, with other d/c instructions as indicated by MD. Educated pt. Granddaughter on how switch from foley bag to leg bag - patient and granddaughter  able to verbalize understanding, all questions fully answered.   Patient instructed to return to ED, call 911, or call MD for any changes in condition.   Patient escorted via wheelchair with nurse tech, and D/C home

## 2014-11-09 NOTE — Discharge Summary (Signed)
Discharge Summary  Oscar Santiago:[id number] DOB: January 20, 1926  PCP: Oscar Santiago  Admit date: 11/04/2014 Discharge date: 11/09/2014  Time spent: >72mins  Recommendations for Outpatient Follow-up:  1. F/u with PMD Oscar Santiago in one week, pmd to repeat EKG and monitor QTc interval 2. F/u with urology in one week , patient is discharged home with foley  3. Home health PT/RN arranged  Discharge Diagnoses:  Active Hospital Problems   Diagnosis Date Noted  . UTI (urinary tract infection) 11/04/2014  . Retention of urine 11/05/2014  . Fever 11/04/2014  . UTI (lower urinary tract infection) 11/04/2014  . TIA (transient ischemic attack) 10/20/2014  . CKD (chronic kidney disease), stage III 12/19/2013  . Seizure disorder (HCC) 12/19/2013  . Chronic systolic dysfunction of left ventricle 03/13/2013  . Cardiac pacemaker in situ 01/05/2013  . Chronic atrial fibrillation (CHADVASc = 5)   . Hypertension   . Hyperlipidemia   . OSA (obstructive sleep apnea)     Resolved Hospital Problems   Diagnosis Date Noted Date Resolved  No resolved problems to display.    Discharge Condition: stable  Diet recommendation: heart healthy  Filed Weights   11/04/14 1615 11/04/14 2023  Weight: 179 lb (81.194 kg) 186 lb 11.7 oz (84.7 kg)    History of present illness:  Oscar Santiago is a 81 y.o. male with a past medical history significant for epilepsy, hypertension, hypothyroidism, OSA on CPAP, chronic systolic CHF (EF 81%-19%), chronic A. fib not on anticoagulation, heart block with pacer, CKD stage III, BPH and recent TIA who presents with a febrile UTI.  The patient was recently admitted from 9/16-9/24 for a TIA (left hemiparesis, resolved). In that hospitalization he underwent right CEA. He also had a seizure during that hospitalization for which he was started on Vimpat. Lastly in that hospitalization he had urinary retention and so coud catheter was placed by urology, and left until  discharge. He had intermittent hematuria after placement of the catheter, for which reason Eliquis was held at discharge.  Yesterday he saw urology, and the catheter was removed as expected and he was able to void last night. Unfortunately this morning he woke up with malaise, recurrent vomiting, and developed a fever today. His family brought him to the emergency room where urinalysis showed numerous white blood cells, fever to 101F, and tachycardia.  In the ED, he was started on ceftriaxone. Dr. Annabell Santiago was consulted in the ER and placed a coud catheter again, with request to keep this in for 2 weeks. The patient was admitted to [facility] for febrile UTI.    Hospital Course:  Principal Problem:   UTI (urinary tract infection) Active Problems:   Chronic atrial fibrillation (CHADVASc = 5)   Hypertension   Hyperlipidemia   OSA (obstructive sleep apnea)   Cardiac pacemaker in situ   Chronic systolic dysfunction of left ventricle   CKD (chronic kidney disease), stage III   Seizure disorder (HCC)   TIA (transient ischemic attack)   Fever   UTI (lower urinary tract infection)   Retention of urine  Urinary tract infection with fever of 101, leukocytosis 11.3 on admission - UTI could be secondary to indwelling foley? VS recurrent urinary retention( patient has  been having indwelling foley and followed by urology outpatient, he had a voiding trial the day before being admitted, then developed recurrent urinary retention and fever) -was treated with ceftriaxone 1 g initially, abx switched to cipro when urine culture resulted. - Urine cultures growing  klebsiella pneumoniae sensitive to Rocephin /cipro and pseudomona that is sensitive to cipro. -blood cultures : No growth to date   Urinary retention - Seen by urology, failed voiding trial as an outpatient, coud catheter was reinserted upon admission by Dr. Annabell Santiago,  -increase Flomax,start proscar -keep foley in and  follow up in 1-2 weeks as an  outpatient with urology, Dr. Patsi Santiago  Hematuria - likely multifactorial including foley trauma, uti and blood thinner -eliquis, aspirin and Lovenox held in the hospital -start on Foley flushes, urology input appreciated. -hematuria resolved at discharge, eliquis restarted, advised granddaughter Oscar Santiago to monitor sign of hematuria, call urology if recurrent hematuria and to discuss discontinue eliquis  Atrial fibrillation with pacemaker:  - Rate controlled without meds  - He is not on an anticoagulant at baseline because of a previous GI bleed and his concerns about anticoagulation. After his recent TIA, Eliquis was recommended, not started because of hematuria during previous hospital stay , unfortunately having recurrence of significant hematuria this hospital stay as well , eliquis, aspirin and Lovenox held in the hospital -eliquis restarted at discharge with hematuria completely resolved , family aware to monitor urine and call to discuss eliquis discontinuation should hematuria reoccur.  Hypertension:  - Acceptable, Continue ARB/norvasc  OSA:  -Continue home CPAP  CKD stage III:  - at baseline. - Continue ARB  Seizures - Continue home Keppra/vimpat, family reported has follow up appointment with neurology Dr. Roda Santiago  Recent TIA and s/p CEA:  - No complaints of focal weakness, face symmetric. - Full dose aspirin currently on hold giving his hematuria -restarted eliquis at discharge   GERD:  -Continue home H2 RA  Hyponatremia: - Most likely related to volume depletion, improving with IV fluids.  Mild cough, possible mild acute on chronic CHF exacerbation, most recent echo showed LVEF 40%, passed swallow eval, cxr small  Bilateral effusions, ivf stopped, lasix resumed. Cough resolved at discharge, no edema at discharge.  QTc prolongation: QTC on admission 580, repeat ekg prior to discharge QTc improved at 489, pmd follow up to continue monitor QTc, family  aware.  Code Status: Full  Family Communication: Patient and granddaughter in room  Disposition Plan: Home with home health   Procedures  None   Consults  urology   Discharge Exam: BP 150/73 mmHg  Pulse 63  Temp(Src) 97.4 F (36.3 C) (Oral)  Resp 17  Ht 5' 10.8" (1.798 m)  Wt 186 lb 11.7 oz (84.7 kg)  BMI 26.20 kg/m2  SpO2 100%  Awake Alert, AAOX3, poor short term memory likely at baseline Supple Neck,No JVD,  Symmetrical Chest wall movement, Good air movement bilaterally,  RRR,No Gallops,Rubs or new Murmurs, No Parasternal Heave +ve B.Sounds, Abd Soft, No tenderness, No organomegaly appriciated, Hematuria has completely resolved at discharge. No Cyanosis, Clubbing or edema, No new Rash or bruise    Discharge Instructions You were cared for by a hospitalist during your hospital stay. If you have any questions about your discharge medications or the care you received while you were in the hospital after you are discharged, you can call the unit and asked to speak with the hospitalist on call if the hospitalist that took care of you is not available. Once you are discharged, your primary care physician will handle any further medical issues. Please note that NO REFILLS for any discharge medications will be authorized once you are discharged, as it is imperative that you return to your primary care physician (or establish a relationship with a  primary care physician if you Santiago not have one) for your aftercare needs so that they can reassess your need for medications and monitor your lab values.  Discharge Instructions    AMB Referral to [facility] Management    Complete by:  As directed   Reason for consult:  Recent hospital readmission  Diagnoses of:   Heart Failure Stroke: Ischemic/TIA    Expected date of contact:  1-3 days (reserved for hospital discharges)  Please assign to community nurse for post hospital follow up transition of care, and assess for home visits. If  you have questions please contact, Egbert Garibaldi, RN, PCCN/Victoria Charmian Muff, RN [facility], [facility] Liaison  [facility] Management [phone]     Diet - low sodium heart healthy    Complete by:  As directed      Face-to-face encounter (required for Medicare/Medicaid patients)    Complete by:  As directed   I Kasheem Toner certify that this patient is under my care and that I, or a nurse practitioner or physician's assistant working with me, had a face-to-face encounter that meets the physician face-to-face encounter requirements with this patient on 11/09/2014. The encounter with the patient was in whole, or in part for the following medical condition(s) which is the primary reason for home health care (List medical condition): FTT/ dementia  The encounter with the patient was in whole, or in part, for the following medical condition, which is the primary reason for home health care:  FTT  I certify that, based on my findings, the following services are medically necessary home health services:   Nursing Physical therapy    Reason for Medically Necessary Home Health Services:  Skilled Nursing- Change/Decline in Patient Status  My clinical findings support the need for the above services:  Cognitive impairments, dementia, or mental confusion  that make it unsafe to leave home  Further, I certify that my clinical findings support that this patient is homebound due to:  Mental confusion     Home Health    Complete by:  As directed   To provide the following care/treatments:   PT RN       Increase activity slowly    Complete by:  As directed             Medication List    STOP taking these medications        aspirin 325 MG tablet      TAKE these medications        acetaminophen 500 MG tablet  Commonly known as:  TYLENOL  Take 1 tablet (500 mg total) by mouth every 8 (eight) hours as needed (pain).     alendronate 70 MG tablet  Commonly known as:  FOSAMAX  Take 70 mg by mouth every  Monday.     amLODipine 10 MG tablet  Commonly known as:  NORVASC  Take 5 mg by mouth daily as needed (diastolic blood pressure over 90).     apixaban 5 MG Tabs tablet  Commonly known as:  ELIQUIS  Take 1 tablet (5 mg total) by mouth 2 (two) times daily. Start in one week if no further blood in urine     BLACK CHERRY CONCENTRATE PO  Take 1 capsule by mouth daily.     cetirizine 10 MG tablet  Commonly known as:  ZYRTEC  Take 10 mg by mouth daily.     ciprofloxacin 500 MG tablet  Commonly known as:  CIPRO  Take 1 tablet (500 mg total)  by mouth 2 (two) times daily.     colchicine 0.6 MG tablet  TAKE 1 TABLET TWICE DAILY     CORICIDIN HBP CONGESTION/COUGH 10-200 MG Caps  Generic drug:  Dextromethorphan-Guaifenesin  Take 1 capsule by mouth daily as needed (for cold).     dorzolamide-timolol 22.3-6.8 MG/ML ophthalmic solution  Commonly known as:  COSOPT  Place 1 drop into both eyes 2 (two) times daily.     famotidine 10 MG chewable tablet  Commonly known as:  PEPCID AC  Chew 10 mg by mouth daily as needed for heartburn.     finasteride 5 MG tablet  Commonly known as:  PROSCAR  Take 1 tablet (5 mg total) by mouth daily.     FLONASE 50 MCG/ACT nasal spray  Generic drug:  fluticasone  Place 2 sprays into both nostrils daily as needed for allergies or rhinitis. \     furosemide 20 MG tablet  Commonly known as:  LASIX  Take 1 tablet (20 mg total) by mouth daily.     lacosamide 50 MG Tabs tablet  Commonly known as:  VIMPAT  Take 1 tablet (50 mg total) by mouth 2 (two) times daily.     latanoprost 0.005 % ophthalmic solution  Commonly known as:  XALATAN  Place 1 drop into both eyes at bedtime.     levETIRAcetam 500 MG tablet  Commonly known as:  KEPPRA  Take 250-500 mg by mouth 2 (two) times daily. Take 1/2 tablet (250 mg) by mouth every morning and 1 tablet (500 mg) every night     levothyroxine 25 MCG tablet  Commonly known as:  SYNTHROID, LEVOTHROID  TAKE 1 TABLET  (25 MCG TOTAL) BY MOUTH DAILY BEFORE BREAKFAST.     megestrol 400 MG/10ML suspension  Commonly known as:  MEGACE  Take 10 mLs (400 mg total) by mouth daily.     multivitamin with minerals Tabs tablet  Take 1 tablet by mouth daily.     pravastatin 20 MG tablet  Commonly known as:  PRAVACHOL  Take 1 tablet (20 mg total) by mouth daily at 6 PM.     tamsulosin 0.4 MG Caps capsule  Commonly known as:  FLOMAX  Take 2 capsules (0.8 mg total) by mouth daily after breakfast.     valsartan 320 MG tablet  Commonly known as:  DIOVAN  Take 1 tablet (320 mg total) by mouth daily with lunch.     vitamin B-12 1000 MCG tablet  Commonly known as:  CYANOCOBALAMIN  Take 1,000 mcg by mouth daily.     zolpidem 5 MG tablet  Commonly known as:  AMBIEN  TAKE 1 TABLET AT BEDTIME       Allergies  Allergen Reactions  . Coumadin [Warfarin Sodium] Other (See Comments)    GI Bleed   . Lisinopril Cough       Follow-up Information    Follow up with Kathi Ludwig, MD.   Specialty:  Urology   Why:  Call to move the appointment up to next week.   Could see a nurse practitioner again if Dr. Patsi Santiago is full.      Contact information:   [address] [place] [address] [phone]       Follow up with [facility].   Why:  resume HH services. Will call 24 to 48 hours after discharge to set up first [facility] visit.   Contact information:   [address] [place] [address] [phone]  Follow up with Oscar Santiago In 1 week.   Specialty:  Internal Medicine   Why:  hospital discharge follow up, repeat EKG at follow up to monitor QTC.   Contact information:   [address] [place]ena Flake [place] [address] [phone]        The results of significant diagnostics from this hospitalization (including imaging, microbiology, ancillary and laboratory) are listed below for reference.    Significant Diagnostic Studies: Ct Angio Head W/cm &/or Wo  Cm  10/20/2014   CLINICAL DATA:  Gait disturbance. Slurred speech. Seizures. Atrial fibrillation. Symptoms began today.  EXAM: CT ANGIOGRAPHY HEAD AND NECK  TECHNIQUE: Multidetector CT imaging of the head and neck was performed using the standard protocol during bolus administration of intravenous contrast. Multiplanar CT image reconstructions and MIPs were obtained to evaluate the vascular anatomy. Carotid stenosis measurements (when applicable) are obtained utilizing NASCET criteria, using the distal internal carotid diameter as the denominator.  CONTRAST:  50mL OMNIPAQUE IOHEXOL 350 MG/ML SOLN  COMPARISON:  CT earlier same day.  FINDINGS: CT HEAD  Brain shows generalized atrophy with mild chronic small-vessel changes of the white matter. No sign of acute infarction, mass lesion, hemorrhage, hydrocephalus or extra-axial collection.  CTA NECK  Aortic arch: Mild atherosclerosis of the arch without aneurysm or dissection. Branching pattern of the brachiocephalic vessels from the arch is normal without origin stenosis.  Right carotid system: Common carotid artery widely patent to the bifurcation. There is advanced atherosclerotic disease affecting the proximal internal carotid artery. Minimal diameter in the bulb is 1.5 mm. Compared to a more distal cervical ICA diameter of 5 mm, this indicates 70% stenosis.  Left carotid system: Common carotid artery widely patent to the bifurcation region. Complex atherosclerotic disease of the ICA bulb with minimal diameter of 1 mm. Compared to a more distal cervical ICA diameter of 5 mm, this indicates an 80% stenosis.  Vertebral arteries:The left vertebral artery is dominant. There is 20% stenosis at the origin. The vessel is widely patent beyond that. The nondominant right vertebral artery is widely patent at its origin and through the cervical region.  Skeleton: Ordinary spondylosis  Other neck: No significant soft tissue lesion no upper chest lesion.  CTA HEAD  Anterior  circulation: Internal carotid arteries are widely patent through the skullbase. There is atherosclerotic calcification in the siphon regions but no stenosis greater than 30-40%. Supra clinoid internal carotid arteries are widely patent. The anterior and middle cerebral vessels are patent without proximal stenosis, aneurysm or vascular malformation. No missing branches are appreciated.  Posterior circulation: Both vertebral arteries are widely patent through the foramen magnum to the basilar. No basilar stenosis. Posterior circulation branch vessels are patent.  Venous sinuses: Patent and normal  Anatomic variants: None significant  IMPRESSION: No CT or CTA evidence of acute intracranial infarction.  70% stenosis of the proximal right internal carotid artery.  80% stenosis of the proximal left internal carotid artery.  Atherosclerotic disease in the carotid siphon regions with maximal stenosis estimated at 30-40% on both sides.   Electronically Signed   By: Paulina Fusi M.D.   On: 10/20/2014 20:32   Dg Chest 2 View  11/08/2014   CLINICAL DATA:  Cough  EXAM: CHEST  2 VIEW  COMPARISON:  November 04, 2014  FINDINGS: There are small pleural effusions bilaterally. There is no edema or consolidation. Heart is borderline enlarged with pulmonary vascularity within normal limits. No adenopathy. Pacemaker lead is attached to the right ventricle.  IMPRESSION:  Small pleural effusions bilaterally. No edema or consolidation. Heart borderline prominent, stable.   Electronically Signed   By: Bretta Bang III M.D.   On: 11/08/2014 20:36   Dg Chest 2 View  10/20/2014   CLINICAL DATA:  Atrial fibrillation.  TIA.  EXAM: CHEST  2 VIEW  COMPARISON:  07/25/2014.  FINDINGS: Cardiac pacer with lead tip projected over right ventricle. Cardiomegaly with normal pulmonary vascularity. Low lung volumes with mild bibasilar atelectasis. No pleural effusion or pneumothorax. Diffuse degenerative change and osteopenia thoracic spine with  stable compression fractures.  IMPRESSION: 1. Stable cardiomegaly.  No CHF.  Cardiac pacer stable position. 2. Low lung volumes with mild bibasilar subsegmental atelectasis.   Electronically Signed   By: Maisie Fus  Register   On: 10/20/2014 14:52   Ct Head Wo Contrast  10/20/2014   CLINICAL DATA:  Confusion, slurred speech.  EXAM: CT HEAD WITHOUT CONTRAST  TECHNIQUE: Contiguous axial images were obtained from the base of the skull through the vertex without intravenous contrast.  COMPARISON:  CT scan of September 20, 2014.  FINDINGS: Bony calvarium appears intact. Moderate diffuse cortical atrophy is noted. Diffuse ventricular dilatation is noted consistent with the degree of atrophy. Minimal chronic ischemic white matter disease is noted. No mass effect or midline shift is noted. There is no evidence of mass lesion, hemorrhage or acute infarction.  IMPRESSION: Moderate diffuse cortical atrophy. Minimal chronic ischemic white matter disease. No acute intracranial abnormality seen. These results were called by telephone at the time of interpretation on 10/20/2014 at 9:24 am to Dr. Roseanne Reno, who verbally acknowledged these results.   Electronically Signed   By: Lupita Raider, M.D.   On: 10/20/2014 09:25   Ct Angio Neck W/cm &/or Wo/cm  10/20/2014   CLINICAL DATA:  Gait disturbance. Slurred speech. Seizures. Atrial fibrillation. Symptoms began today.  EXAM: CT ANGIOGRAPHY HEAD AND NECK  TECHNIQUE: Multidetector CT imaging of the head and neck was performed using the standard protocol during bolus administration of intravenous contrast. Multiplanar CT image reconstructions and MIPs were obtained to evaluate the vascular anatomy. Carotid stenosis measurements (when applicable) are obtained utilizing NASCET criteria, using the distal internal carotid diameter as the denominator.  CONTRAST:  50mL OMNIPAQUE IOHEXOL 350 MG/ML SOLN  COMPARISON:  CT earlier same day.  FINDINGS: CT HEAD  Brain shows generalized atrophy with  mild chronic small-vessel changes of the white matter. No sign of acute infarction, mass lesion, hemorrhage, hydrocephalus or extra-axial collection.  CTA NECK  Aortic arch: Mild atherosclerosis of the arch without aneurysm or dissection. Branching pattern of the brachiocephalic vessels from the arch is normal without origin stenosis.  Right carotid system: Common carotid artery widely patent to the bifurcation. There is advanced atherosclerotic disease affecting the proximal internal carotid artery. Minimal diameter in the bulb is 1.5 mm. Compared to a more distal cervical ICA diameter of 5 mm, this indicates 70% stenosis.  Left carotid system: Common carotid artery widely patent to the bifurcation region. Complex atherosclerotic disease of the ICA bulb with minimal diameter of 1 mm. Compared to a more distal cervical ICA diameter of 5 mm, this indicates an 80% stenosis.  Vertebral arteries:The left vertebral artery is dominant. There is 20% stenosis at the origin. The vessel is widely patent beyond that. The nondominant right vertebral artery is widely patent at its origin and through the cervical region.  Skeleton: Ordinary spondylosis  Other neck: No significant soft tissue lesion no upper chest lesion.  CTA HEAD  Anterior  circulation: Internal carotid arteries are widely patent through the skullbase. There is atherosclerotic calcification in the siphon regions but no stenosis greater than 30-40%. Supra clinoid internal carotid arteries are widely patent. The anterior and middle cerebral vessels are patent without proximal stenosis, aneurysm or vascular malformation. No missing branches are appreciated.  Posterior circulation: Both vertebral arteries are widely patent through the foramen magnum to the basilar. No basilar stenosis. Posterior circulation branch vessels are patent.  Venous sinuses: Patent and normal  Anatomic variants: None significant  IMPRESSION: No CT or CTA evidence of acute intracranial  infarction.  70% stenosis of the proximal right internal carotid artery.  80% stenosis of the proximal left internal carotid artery.  Atherosclerotic disease in the carotid siphon regions with maximal stenosis estimated at 30-40% on both sides.   Electronically Signed   By: Paulina Fusi M.D.   On: 10/20/2014 20:32   Dg Chest Port 1 View  11/04/2014   CLINICAL DATA:  Fever.  EXAM: PORTABLE CHEST 1 VIEW  COMPARISON:  10/20/2014 chest radiograph  FINDINGS: Single lead left subclavian pacemaker is stable in configuration. Stable cardiomediastinal silhouette with mild cardiomegaly. No pneumothorax. No pleural effusion. Mild curvilinear opacity at the left costophrenic angle, favor atelectasis. No pulmonary edema. No additional focal lung opacity.  IMPRESSION: Mild curvilinear opacity at the left costophrenic angle, favor atelectasis.  Stable mild cardiomegaly without pulmonary edema.   Electronically Signed   By: Delbert Phenix M.D.   On: 11/04/2014 18:23    Microbiology: Recent Results (from the past 240 hour(s))  Culture, blood (routine x 2)     Status: None (Preliminary result)   Collection Time: 11/04/14  4:23 PM  Result Value Ref Range Status   Specimen Description BLOOD RIGHT ANTECUBITAL  Final   Special Requests BOTTLES DRAWN AEROBIC AND ANAEROBIC 5 CC  Final   Culture NO GROWTH 4 DAYS  Final   Report Status PENDING  Incomplete  Culture, blood (routine x 2)     Status: None (Preliminary result)   Collection Time: 11/04/14  4:30 PM  Result Value Ref Range Status   Specimen Description BLOOD RIGHT HAND  Final   Special Requests BOTTLES DRAWN AEROBIC AND ANAEROBIC 5CC  Final   Culture NO GROWTH 4 DAYS  Final   Report Status PENDING  Incomplete  Urine culture     Status: None   Collection Time: 11/04/14  4:42 PM  Result Value Ref Range Status   Specimen Description URINE, CLEAN CATCH  Final   Special Requests NONE  Final   Culture   Final    >=100,000 COLONIES/mL KLEBSIELLA  PNEUMONIAE >=100,000 COLONIES/mL PSEUDOMONAS AERUGINOSA    Report Status 11/08/2014 FINAL  Final   Organism ID, Bacteria KLEBSIELLA PNEUMONIAE  Final   Organism ID, Bacteria PSEUDOMONAS AERUGINOSA  Final      Susceptibility   Klebsiella pneumoniae - MIC*    AMPICILLIN >=32 RESISTANT Resistant     CEFAZOLIN <=4 SENSITIVE Sensitive     CEFTRIAXONE <=1 SENSITIVE Sensitive     CIPROFLOXACIN <=0.25 SENSITIVE Sensitive     GENTAMICIN <=1 SENSITIVE Sensitive     IMIPENEM <=0.25 SENSITIVE Sensitive     NITROFURANTOIN 32 SENSITIVE Sensitive     TRIMETH/SULFA <=20 SENSITIVE Sensitive     AMPICILLIN/SULBACTAM 8 SENSITIVE Sensitive     PIP/TAZO <=4 SENSITIVE Sensitive     * >=100,000 COLONIES/mL KLEBSIELLA PNEUMONIAE   Pseudomonas aeruginosa - MIC*    CEFTAZIDIME 4 SENSITIVE Sensitive     CIPROFLOXACIN <=0.25  SENSITIVE Sensitive     GENTAMICIN 2 SENSITIVE Sensitive     IMIPENEM 2 SENSITIVE Sensitive     PIP/TAZO 8 SENSITIVE Sensitive     CEFEPIME 2 SENSITIVE Sensitive     * >=100,000 COLONIES/mL PSEUDOMONAS AERUGINOSA     Labs: Basic Metabolic Panel:  Recent Labs Lab 11/05/14 0445 11/06/14 0629 11/07/14 0610 11/08/14 0607 11/09/14 0542  NA 130* 132* 134* 134* 135  K 3.8 3.8 3.7 3.5 3.7  CL 102 104 108 105 107  CO2 21* 23 22 20* 23  GLUCOSE 131* 99 92 89 97  BUN 14 14 14 11 11   CREATININE 1.29* 1.21 1.13 1.04 1.07  CALCIUM 7.9* 7.9* 7.7* 7.6* 8.0*   Liver Function Tests:  Recent Labs Lab 11/04/14 1710 11/09/14 0542  AST 36 54*  ALT 19 35  ALKPHOS 70 61  BILITOT 1.2 0.4  PROT 7.3 5.7*  ALBUMIN 3.6 2.4*   No results for input(s): LIPASE, AMYLASE in the last 168 hours. No results for input(s): AMMONIA in the last 168 hours. CBC:  Recent Labs Lab 11/04/14 1710 11/05/14 0445 11/06/14 0629 11/08/14 0607 11/09/14 0542  WBC 9.7 11.3* 9.3 4.5 4.4  NEUTROABS 8.3*  --   --   --   --   HGB 12.1* 10.4* 10.7* 10.3* 10.7*  HCT 35.6* 31.0* 31.5* 29.8* 31.8*  MCV 83.6  85.2 85.1 83.7 83.2  PLT 205 162 170 182 205   Cardiac Enzymes: No results for input(s): CKTOTAL, CKMB, CKMBINDEX, TROPONINI in the last 168 hours. BNP: BNP (last 3 results) No results for input(s): BNP in the last 8760 hours.  ProBNP (last 3 results) No results for input(s): PROBNP in the last 8760 hours.  CBG: No results for input(s): GLUCAP in the last 168 hours.     SignedAlbertine Grates MD, PhD  [facility] 11/09/2014, 11:24 AM

## 2014-11-09 NOTE — Care Management Note (Signed)
Case Management Note  Patient Details  Name: Oscar Santiago MRN: [id number] Date of Birth: 19-Nov-1925  Subjective/Objective:                  DC to home with home health. Referral made to [facility] for services.   Action/Plan:   Expected Discharge Date:                  Expected Discharge Plan:  Home w Home Health Services  In-House Referral:     Discharge planning Services  CM Consult  Post Acute Care Choice:  Resumption of Svcs/PTA Provider Choice offered to:  Adult Children  DME Arranged:    DME Agency:     HH Arranged:  RN, PT, OT, Nurse's Aide HH Agency:  [facility]  Status of Service:  Completed, signed off  Medicare Important Message Given:  Yes-second notification given Date Medicare IM Given:    Medicare IM give by:    Date Additional Medicare IM Given:    Additional Medicare Important Message give by:     If discussed at Long Length of Stay Meetings, dates discussed:    Additional Comments:  Lawerance Sabal, RN 11/09/2014, 11:08 AM

## 2014-11-09 NOTE — Progress Notes (Signed)
Physical Therapy Treatment Patient Details Name: DMITRI PETTIGREW MRN: [id number] DOB: Jan 03, 1926 Today's Date: 12/08/14    History of Present Illness Pt adm with UTI after recent adm for TIA. PMH - epilepsy, hypertension, hypothyroidism, OSA on CPAP, chronic systolic CHF (EF 40%), chronic A. fib not on anticoagulation, heart block with pacer, CKD stage III    PT Comments    Pt making excellent progress.  Follow Up Recommendations  Home health PT;Supervision/Assistance - 24 hour     Equipment Recommendations  None recommended by PT    Recommendations for Other Services       Precautions / Restrictions Precautions Precautions: Fall Restrictions Weight Bearing Restrictions: No    Mobility  Bed Mobility                  Transfers Overall transfer level: Needs assistance Equipment used: Rolling walker (2 wheeled) Transfers: Sit to/from Stand Sit to Stand: Supervision         General transfer comment: Incr time and effort  Ambulation/Gait Ambulation/Gait assistance: Supervision Ambulation Distance (Feet): 320 Feet Assistive device: Rolling walker (2 wheeled) Gait Pattern/deviations: Step-through pattern;Decreased stride length Gait velocity: decr   General Gait Details: Steady gait with walker   Stairs            Wheelchair Mobility    Modified Rankin (Stroke Patients Only)       Balance   Sitting-balance support: No upper extremity supported;Feet supported Sitting balance-Leahy Scale: Good     Standing balance support: No upper extremity supported Standing balance-Leahy Scale: Fair                      Cognition Arousal/Alertness: Awake/alert Behavior During Therapy: WFL for tasks assessed/performed Overall Cognitive Status: Within Functional Limits for tasks assessed                      Exercises      General Comments        Pertinent Vitals/Pain Pain Assessment: No/denies pain    Home Living                       Prior Function            PT Goals (current goals can now be found in the care plan section) Acute Rehab PT Goals Patient Stated Goal: return home PT Goal Formulation: With patient Time For Goal Achievement: 11/06/14 Potential to Achieve Goals: Good Progress towards PT goals: Progressing toward goals    Frequency  Min 3X/week    PT Plan Current plan remains appropriate    Co-evaluation             End of Session   Activity Tolerance: Patient tolerated treatment well Patient left: in chair;with call bell/phone within reach;with family/visitor present     Time: 9811-9147 PT Time Calculation (min) (ACUTE ONLY): 13 min  Charges:  $Gait Training: 8-22 mins                    G Codes:      Nevaen Tredway 08-Dec-2014, 11:08 AM Skip Mayer PT [phone]

## 2014-11-13 ENCOUNTER — Ambulatory Visit (INDEPENDENT_AMBULATORY_CARE_PROVIDER_SITE_OTHER): Payer: Medicare Other | Admitting: Internal Medicine

## 2014-11-13 ENCOUNTER — Telehealth: Payer: Self-pay | Admitting: Internal Medicine

## 2014-11-13 ENCOUNTER — Encounter: Payer: Self-pay | Admitting: Internal Medicine

## 2014-11-13 ENCOUNTER — Other Ambulatory Visit: Payer: Self-pay

## 2014-11-13 VITALS — BP 130/84 | HR 75 | Temp 98.1°F | Resp 24 | Ht 70.5 in | Wt 199.0 lb

## 2014-11-13 DIAGNOSIS — R339 Retention of urine, unspecified: Secondary | ICD-10-CM

## 2014-11-13 DIAGNOSIS — I482 Chronic atrial fibrillation, unspecified: Secondary | ICD-10-CM

## 2014-11-13 DIAGNOSIS — M7989 Other specified soft tissue disorders: Secondary | ICD-10-CM

## 2014-11-13 DIAGNOSIS — I10 Essential (primary) hypertension: Secondary | ICD-10-CM

## 2014-11-13 DIAGNOSIS — N183 Chronic kidney disease, stage 3 unspecified: Secondary | ICD-10-CM

## 2014-11-13 DIAGNOSIS — R319 Hematuria, unspecified: Secondary | ICD-10-CM

## 2014-11-13 DIAGNOSIS — Z950 Presence of cardiac pacemaker: Secondary | ICD-10-CM | POA: Diagnosis not present

## 2014-11-13 DIAGNOSIS — I519 Heart disease, unspecified: Secondary | ICD-10-CM

## 2014-11-13 DIAGNOSIS — N390 Urinary tract infection, site not specified: Secondary | ICD-10-CM

## 2014-11-13 NOTE — Progress Notes (Signed)
Subjective:    Patient ID: Oscar Santiago, male    DOB: July 07, 1925, 81 y.o.   MRN: [id number]  HPI PCP: Kristian Covey, MD  Admit date: 10/20/2014 Discharge date: 10/28/2014  Recommendations for Outpatient Follow-up:  1. Outpatient f/u with urology Home PT, OT, RN arranged Home with bladder catheter  Discharge Diagnoses:  Principal Problem:  TIA (transient ischemic attack) Active Problems:  Chronic atrial fibrillation (CHADVASc = 5)  Hypertension  Hyperlipidemia  OSA (obstructive sleep apnea)  Cardiac pacemaker in situ  CKD (chronic kidney disease), stage III  Seizure disorder  Hypothyroidism  Leukopenia  Thrombocytopenia  Chronic systolic congestive heart failure, NYHA class 1  Hematuria Admit date: 11/04/2014 Discharge date: 11/09/2014  Recommendations for Outpatient Follow-up:  2. F/u with PMD Dr. Artist Pais in one week, pmd to repeat EKG and monitor QTc interval 3. F/u with urology in one week , patient is discharged home with foley  4. Home health PT/RN arranged  Discharge Diagnoses:  Active Hospital Problems   Diagnosis Date Noted  . UTI (urinary tract infection) 11/04/2014  . Retention of urine 11/05/2014  . Fever 11/04/2014  . UTI (lower urinary tract infection) 11/04/2014  . TIA (transient ischemic attack) 10/20/2014  . CKD (chronic kidney disease), stage III 12/19/2013  . Seizure disorder (HCC) 12/19/2013  . Chronic systolic dysfunction of left ventricle 03/13/2013  . Cardiac pacemaker in situ 01/05/2013  . Chronic atrial fibrillation (CHADVASc = 5)   . Hypertension   . Hyperlipidemia   . OSA (obstructive sleep apnea)         11/13/14.  81 year old patient with decompensated past medical history.  He was admitted in September with left-sided weakness and underwent a right carotid endarterectomy.  He apparently has significant left-sided carotid stenosis and surgery for left CAS  is planned in  6 weeks. He was readmitted last week with acute urinary tract infection; he has a history of urinary retention and has had a Foley catheter.  Additionally, patient has had intermittent gross hematuria and anticoagulation has been on hold.  He is scheduled for urology follow-up later this week  Complaints today include increasing cough and wheezing.  He is also noticed some swelling involving the left arm as well as the left foot.  He is concerned about gout.  Due to intermittent gross hematuria, his daughter has decrease aspirin from 325 mg to 81 mg daily.  He does have a prescription for Eliquis , but has not started taking the medication  He is completing Cipro antibiotics  He is scheduled for follow-up with PCP in 2 days  Past Medical History  Diagnosis Date  . Hypertension   . Seizure disorder (HCC)   . Glaucoma   . Hyperlipidemia   . Osteopenia   . Compression fracture of spine (HCC)     T 10 and T11  . Gout   . OSA (obstructive sleep apnea)     on CPAP therapy  . Hypothyroid   . Inguinal hernia   . Hemorrhoid   . Chronic systolic dysfunction of left ventricle     EF 30-35% by echo 10/10/2009  . Major depression (HCC)   . History of GI bleed     from coumadin  . MR (mitral regurgitation)   . Hearing loss   . Dizziness     intermittent  . Kidney stone   . CKD (chronic kidney disease), stage III   . Permanent atrial fibrillation (HCC)     refuses coumadin due to  history of GI bleed  . Decreased appetite 03/02/12  . Weight loss 03/02/12  . Complete heart block (HCC)   . CHF (congestive heart failure) (HCC)   . Seizures [facility])     Social History   Social History  . Marital Status: Widowed    Spouse Name: N/A  . Number of Children: 6  . Years of Education: N/A   Occupational History  . Retired     Education officer, environmental   Social History Main Topics  . Smoking status: Never Smoker   . Smokeless tobacco: Never Used  . Alcohol Use: No  . Drug Use: No  . Sexual Activity: Not on  file   Other Topics Concern  . 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 Katrinka Blazing - Cardiology   Dr. Leone Payor - Gastroenterology   Dr. Brunilda Payor - Urology   [facility] Ophthalmology    Past Surgical History  Procedure Laterality Date  . Transurethral resection of prostate    . Cardiac catheterization  2002  . Pacemaker insertion  07/28/06    VVI pacemaker for complete heart block by Dr Amil Amen  . Inguinal hernia repair    . Hemorrhoid surgery    . Colonoscopy with propofol N/A 05/01/2014    Procedure: COLONOSCOPY WITH PROPOFOL;  Surgeon: [name], MD;  Location: [facility] END[facility];  Service: Endoscopy;  Laterality: N/A;  . Endarterectomy Right 10/23/2014    Procedure: ENDARTERECTOMY CAROTID;  Surgeon: Larina Earthly, MD;  Location: [facility];  Service: Vascular;  Laterality: Right;    Family History  Problem Relation Age of Onset  . Heart failure Mother   . CVA Sister   . Aneurysm Brother     brain  . CVA Brother   . Colon cancer Neg Hx   . Colon polyps Neg Hx   . Transient ischemic attack Daughter     Allergies  Allergen Reactions  . Coumadin [Warfarin Sodium] Other (See Comments)    GI Bleed   . Lisinopril Cough    Current Outpatient Prescriptions on File Prior to Visit  Medication Sig Dispense Refill  . acetaminophen (TYLENOL) 500 MG tablet Take 1 tablet (500 mg total) by mouth every 8 (eight) hours as needed (pain). 30 tablet 0  . alendronate (FOSAMAX) 70 MG tablet Take 70 mg by mouth every Monday.     Marland Kitchen amLODipine (NORVASC) 10 MG tablet Take 5 mg by mouth daily as needed (diastolic blood pressure over 90).     . cetirizine (ZYRTEC) 10 MG tablet Take 10 mg by mouth daily.    . ciprofloxacin (CIPRO) 500 MG tablet Take 1 tablet (500 mg total) by mouth 2 (two) times daily. 10 tablet 0  . colchicine 0.6 MG tablet TAKE 1 TABLET TWICE DAILY  (Patient taking differently: TAKE 1 TABLET TWICE DAILY AS NEEDED FOR GOUT) 30 tablet 4  . Dextromethorphan-Guaifenesin (CORICIDIN HBP CONGESTION/COUGH) 10-200 MG CAPS Take 1 capsule by mouth daily as needed (for cold).     . dorzolamide-timolol (COSOPT) 22.3-6.8 MG/ML ophthalmic solution Place 1 drop into both eyes 2 (two) times daily.  6  . famotidine (PEPCID AC) 10 MG chewable tablet Chew 10 mg by mouth daily as needed for heartburn.     . finasteride (PROSCAR) 5 MG tablet Take 1 tablet (5  mg total) by mouth daily. 30 tablet 0  . fluticasone (FLONASE) 50 MCG/ACT nasal spray Place 2 sprays into both nostrils daily as needed for allergies or rhinitis. \    . furosemide (LASIX) 20 MG tablet Take 1 tablet (20 mg total) by mouth daily. 30 tablet 11  . latanoprost (XALATAN) 0.005 % ophthalmic solution Place 1 drop into both eyes at bedtime.     . levETIRAcetam (KEPPRA) 500 MG tablet Take 250-500 mg by mouth 2 (two) times daily. Take 1/2 tablet (250 mg) by mouth every morning and 1 tablet (500 mg) every night    . levothyroxine (SYNTHROID, LEVOTHROID) 25 MCG tablet TAKE 1 TABLET (25 MCG TOTAL) BY MOUTH DAILY BEFORE BREAKFAST. 90 tablet 1  . megestrol (MEGACE) 400 MG/10ML suspension Take 10 mLs (400 mg total) by mouth daily. 240 mL 0  . Misc Natural Products (BLACK CHERRY CONCENTRATE PO) Take 1 capsule by mouth daily.    . Multiple Vitamin (MULTIVITAMIN WITH MINERALS) TABS tablet Take 1 tablet by mouth daily.    . pravastatin (PRAVACHOL) 20 MG tablet Take 1 tablet (20 mg total) by mouth daily at 6 PM. 30 tablet 1  . tamsulosin (FLOMAX) 0.4 MG CAPS capsule Take 2 capsules (0.8 mg total) by mouth daily after breakfast. 30 capsule 0  . valsartan (DIOVAN) 320 MG tablet Take 1 tablet (320 mg total) by mouth daily with lunch. 30 tablet 11  . vitamin B-12 (CYANOCOBALAMIN) 1000 MCG tablet Take 1,000 mcg by mouth daily.    Marland Kitchen zolpidem (AMBIEN) 5 MG tablet TAKE 1 TABLET AT BEDTIME (Patient taking differently: TAKE  1/2 TABLET BY MOUTH AT BEDTIME) 30 tablet 2  . apixaban (ELIQUIS) 5 MG TABS tablet Take 1 tablet (5 mg total) by mouth 2 (two) times daily. Start in one week if no further blood in urine (Patient not taking: Reported on 11/13/2014) 60 tablet 1  . lacosamide (VIMPAT) 50 MG TABS tablet Take 1 tablet (50 mg total) by mouth 2 (two) times daily. (Patient not taking: Reported on 11/13/2014) 60 tablet 1   No current facility-administered medications on file prior to visit.    BP 130/84 mmHg  Pulse 75  Temp(Src) 98.1 F (36.7 C) (Oral)  Resp 24  Ht 5' 10.5" (1.791 m)  Wt 199 lb (90.266 kg)  BMI 28.14 kg/m2  SpO2 98%      Review of Systems  Constitutional: Positive for activity change and appetite change. Negative for fever, chills and fatigue.  HENT: Negative for congestion, dental problem, ear pain, hearing loss, sore throat, tinnitus, trouble swallowing and voice change.   Eyes: Negative for pain, discharge and visual disturbance.  Respiratory: Positive for cough and shortness of breath. Negative for chest tightness, wheezing and stridor.   Cardiovascular: Positive for leg swelling. Negative for chest pain and palpitations.  Gastrointestinal: Negative for nausea, vomiting, abdominal pain, diarrhea, constipation, blood in stool and abdominal distention.  Genitourinary: Negative for urgency, hematuria, flank pain, discharge, difficulty urinating and genital sores.  Musculoskeletal: Negative for myalgias, back pain, joint swelling, arthralgias, gait problem and neck stiffness.  Skin: Negative for rash.  Neurological: Negative for dizziness, syncope, speech difficulty, weakness, numbness and headaches.  Hematological: Negative for adenopathy. Does not bruise/bleed easily.  Psychiatric/Behavioral: Negative for behavioral problems and dysphoric mood. The patient is not nervous/anxious.        Objective:   Physical Exam  Constitutional: He is oriented to person, place, and time. He appears  well-developed.  HENT:  Head: Normocephalic.  Right Ear: External ear normal.  Left Ear: External ear normal.  Eyes: Conjunctivae and EOM are normal.  Neck: Normal range of motion.  Cardiovascular: Normal rate and normal heart sounds.   Right 75  Pulmonary/Chest:  O2 saturation 98  Diminished breath sounds but clear   Abdominal: Bowel sounds are normal.  Musculoskeletal: Normal range of motion. He exhibits no edema or tenderness.  Soft tissue swelling about the left elbow and proximal forearm area  Left lower leg edema, most prominent over the dorsal aspect of the left foot  Neurological: He is alert and oriented to person, place, and time.  Psychiatric: He has a normal mood and affect. His behavior is normal.          Assessment & Plan:   Chronic systolic heart failure Left leg edema, rule out DVT Left arm edema Anorexia.  Megace will be discontinued in view of its thrombotic risk and unclear benefit Chronic kidney disease Seizure disorder Chronic atrial fibrillation Status post recent right brain stroke Intermittent gross hematuria History of urinary retention with Foley catheter.  Follow-up urology in 2 days History of UTI.  Will complete Cipro   Low-salt diet recommended Megace discontinued We'll attempt.  Acute left arm and legs elevated We'll check venous Doppler studies to rule out DVT of both left arm and left leg. May need to consider IVC filter if DVT is confirmed and patient not a candidate for oral anticoagulation Follow-up PCP in 2 days Blood pressure well controlled today.  Patiently on amlodipine 10 mg.  Will consider a dose reduction of blood pressure remained stable History of gout.  We'll continue coaxing

## 2014-11-13 NOTE — Patient Instructions (Addendum)
Limit your sodium (Salt) intake  Urology follow-up as scheduled  Cardiology follow-up as scheduled  Return here in one month  Keep left arm and left leg elevated as much as possible  Venous Doppler studies as discussed.  I  Discontinue Megace

## 2014-11-13 NOTE — Telephone Encounter (Addendum)
Pt has increase weezing and SOB, cough. Pt has a foley and blood in his urine.   [facility] did a transitional call this am and wants pt to be seen today.  They are trying to keep the pt out of the hospital.  Discussed with office manager, and patient was scheduled with our doc of the day, dr Amador Cunas at 2:45 pm I called the daughter to make the appointment. Daughter was advised Dr Caryl Never did not have any openings, and was OK With appointment seeing Dr Kirtland Bouchard

## 2014-11-13 NOTE — Telephone Encounter (Signed)
Pt discharged from hospital last week.  Oscar Santiago, physical therapist with [facility] would like to resume seeing pt.  Needs order to continue to see patient for home health physical therapy twice a week for 3 additional weeks.

## 2014-11-13 NOTE — Progress Notes (Signed)
Pre visit review using our clinic review tool, if applicable. No additional management support is needed unless otherwise documented below in the visit note. 

## 2014-11-14 ENCOUNTER — Ambulatory Visit (HOSPITAL_COMMUNITY)
Admission: RE | Admit: 2014-11-14 | Discharge: 2014-11-14 | Disposition: A | Discharge status: Home / Self Care | Payer: Medicare Other | Source: Ambulatory Visit | Attending: Cardiovascular Disease | Admitting: Cardiovascular Disease

## 2014-11-14 ENCOUNTER — Telehealth: Payer: Self-pay | Admitting: *Deleted

## 2014-11-14 ENCOUNTER — Other Ambulatory Visit: Payer: Self-pay | Admitting: Internal Medicine

## 2014-11-14 ENCOUNTER — Ambulatory Visit (HOSPITAL_BASED_OUTPATIENT_CLINIC_OR_DEPARTMENT_OTHER)
Admission: RE | Admit: 2014-11-14 | Discharge: 2014-11-14 | Disposition: A | Discharge status: Home / Self Care | Payer: Medicare Other | Source: Ambulatory Visit | Attending: Cardiovascular Disease | Admitting: Cardiovascular Disease

## 2014-11-14 DIAGNOSIS — M7989 Other specified soft tissue disorders: Secondary | ICD-10-CM

## 2014-11-14 DIAGNOSIS — E785 Hyperlipidemia, unspecified: Secondary | ICD-10-CM | POA: Diagnosis not present

## 2014-11-14 DIAGNOSIS — M79605 Pain in left leg: Secondary | ICD-10-CM

## 2014-11-14 DIAGNOSIS — I10 Essential (primary) hypertension: Secondary | ICD-10-CM | POA: Diagnosis not present

## 2014-11-14 DIAGNOSIS — M79602 Pain in left arm: Secondary | ICD-10-CM

## 2014-11-14 NOTE — Patient Outreach (Signed)
This RNCM made call to physician's office to schedule same day visit after daughter reports increased cough, shortness of breath and blood in foley collection bag.  Daughter states her father's endurance is decreased also.    Was able to secure an appointment for 215 on October 10.

## 2014-11-14 NOTE — Telephone Encounter (Signed)
Left message on voicemail to call office.  

## 2014-11-14 NOTE — Telephone Encounter (Signed)
Dr. Kirtland Bouchard, said to call pt and tell him to stop Megace.

## 2014-11-14 NOTE — Telephone Encounter (Signed)
Please advise if okay for pt to resume PT?

## 2014-11-14 NOTE — Telephone Encounter (Signed)
Pt's granddaughter [name], called back, told her Dr.K said to stop Megace. [name] said she stopped it two days ago. Told her okay, make sure keep follow up appt with Dr.Burchette. [name] verbalized understanding.

## 2014-11-14 NOTE — Telephone Encounter (Signed)
Verbal order was given via Dr.Todd that it is okay for the pt to continue this therapy for twice a week for three weeks

## 2014-11-15 ENCOUNTER — Ambulatory Visit: Payer: Medicare Other | Admitting: Family Medicine

## 2014-11-16 DIAGNOSIS — Z48812 Encounter for surgical aftercare following surgery on the circulatory system: Secondary | ICD-10-CM | POA: Diagnosis not present

## 2014-11-23 ENCOUNTER — Encounter: Payer: Self-pay | Admitting: Family Medicine

## 2014-11-23 ENCOUNTER — Ambulatory Visit (INDEPENDENT_AMBULATORY_CARE_PROVIDER_SITE_OTHER): Payer: Medicare Other | Admitting: Family Medicine

## 2014-11-23 VITALS — BP 140/70 | HR 72 | Temp 98.3°F | Wt 189.0 lb

## 2014-11-23 DIAGNOSIS — I4581 Long QT syndrome: Secondary | ICD-10-CM | POA: Diagnosis not present

## 2014-11-23 DIAGNOSIS — N183 Chronic kidney disease, stage 3 unspecified: Secondary | ICD-10-CM

## 2014-11-23 DIAGNOSIS — E785 Hyperlipidemia, unspecified: Secondary | ICD-10-CM | POA: Diagnosis not present

## 2014-11-23 DIAGNOSIS — I10 Essential (primary) hypertension: Secondary | ICD-10-CM

## 2014-11-23 DIAGNOSIS — I639 Cerebral infarction, unspecified: Secondary | ICD-10-CM | POA: Diagnosis not present

## 2014-11-23 DIAGNOSIS — R9431 Abnormal electrocardiogram [ECG] [EKG]: Secondary | ICD-10-CM

## 2014-11-23 NOTE — Progress Notes (Signed)
Subjective:    Patient ID: Oscar Santiago, male    DOB: 12/08/1925, 81 y.o.   MRN: [id number]  HPI Hospital follow-up. Patient is 81 years old with past history of seizure disorder, hypertension, hypothyroidism, obstructive sleep apnea, chronic systolic heart failure, chronic atrial fibrillation, history of heart block with pacemaker, stage III chronic kidney disease, BPH. He had presented on 16th of September with TIA with left hemiparesis which resolved. He underwent right carotid endarterectomy. He had a seizure during the hospitalization and was started on Vimpat. He had developed some urinary retention and there some initial difficulty with catheterization. He developed significant hematuria. He cannot be placed on anticoagulant for his atrial fibrillation because of the hematuria issues.  He has been followed by urology and is currently on Flomax and finasteride but they've been unable to discontinue Foley catheter. At one point he had residual urine of 430 mL.  He was then readmitted on October 1 with fever and tachycardia. Treated with ceftriaxone. Urine cultures grew out Klebsiella pneumonia sensitive to Cipro. Blood cultures were negative.  He presents today with Foley catheter in place. No further hematuria. Pending follow-up with urology.  He has good appetite. He is ambulating with walker. No recent falls. Was placed on pravastatin with mildly elevated LDL. Tolerating without side effects.  He had 80% left carotid lesion and right ICA 70%. He has pending follow-up with vascular surgery  Past Medical History  Diagnosis Date  . Hypertension   . Seizure disorder (HCC)   . Glaucoma   . Hyperlipidemia   . Osteopenia   . Compression fracture of spine (HCC)     T 10 and T11  . Gout   . OSA (obstructive sleep apnea)     on CPAP therapy  . Hypothyroid   . Inguinal hernia   . Hemorrhoid   . Chronic systolic dysfunction of left ventricle     EF 30-35% by echo 10/10/2009  . Major  depression (HCC)   . History of GI bleed     from coumadin  . MR (mitral regurgitation)   . Hearing loss   . Dizziness     intermittent  . Kidney stone   . CKD (chronic kidney disease), stage III   . Permanent atrial fibrillation (HCC)     refuses coumadin due to history of GI bleed  . Decreased appetite 03/02/12  . Weight loss 03/02/12  . Complete heart block (HCC)   . CHF (congestive heart failure) (HCC)   . Seizures [facility](HCC)    Past Surgical History  Procedure Laterality Date  . Transurethral resection of prostate    . Cardiac catheterization  2002  . Pacemaker insertion  07/28/06    VVI pacemaker for complete heart block by Dr Amil AmenEdmunds  . Inguinal hernia repair    . Hemorrhoid surgery    . Colonoscopy with propofol N/A 05/01/2014    Procedure: COLONOSCOPY WITH PROPOFOL;  Surgeon: Iva Booparl E Gessner, MD;  Location: [facility];  Service: Endoscopy;  Laterality: N/A;  . Endarterectomy Right 10/23/2014    Procedure: ENDARTERECTOMY CAROTID;  Surgeon: Larina Earthlyodd F Early, MD;  [facility];  Service: Vascular;  Laterality: Right;    reports that he has never smoked. He has never used smokeless tobacco. He reports that he does not drink alcohol or use illicit drugs. family history includes Aneurysm in his brother; CVA in his brother and sister; Heart failure in his mother; Transient ischemic attack in his daughter. There is no history  of Colon cancer or Colon polyps. Allergies  Allergen Reactions  . Coumadin [Warfarin Sodium] Other (See Comments)    GI Bleed   . Lisinopril Cough      Review of Systems  Constitutional: Negative for fatigue and unexpected weight change.  Eyes: Negative for visual disturbance.  Respiratory: Negative for cough, chest tightness and shortness of breath.   Cardiovascular: Negative for chest pain, palpitations and leg swelling.  Gastrointestinal: Negative for abdominal pain.  Endocrine: Negative for polydipsia and polyuria.  Neurological: Negative for  dizziness, syncope, weakness, light-headedness and headaches.  Psychiatric/Behavioral: Negative for confusion.       Objective:   Physical Exam  Constitutional: He appears well-developed and well-nourished. No distress.  Neck: Neck supple.  Cardiovascular: Normal rate.   Pulmonary/Chest: Effort normal and breath sounds normal. No respiratory distress. He has no wheezes. He has no rales.  Musculoskeletal: He exhibits no edema.  Neurological: He is alert.          Assessment & Plan:  #1 recent TIA with left hemiparesis . No recurrent TIA symptoms #2 status post recent right carotid endarterectomy without complication #3 mild dyslipidemia. Recent initiation pravastatin. Schedule future labs with lipid and hepatic  #4 recent mild QT prolongation. Recheck EKG today QT interval is normal. #5 urinary retention secondary to BPH. Recent UTI with Klebsiella. Has Foley catheter in place at this time. No recurrent fevers. Currently off antibiotics.  Follow up with Urology.

## 2014-11-28 ENCOUNTER — Other Ambulatory Visit: Payer: Self-pay

## 2014-11-30 ENCOUNTER — Telehealth: Payer: Self-pay | Admitting: Internal Medicine

## 2014-11-30 NOTE — Telephone Encounter (Signed)
Pt granddaughter Valora PiccoloMikosha Gatson call to say that Dr Caryl NeverBurchette sign off on FMLA paperwork and it was through  12/04/14. Granddaughter call to ask for it to be extended from 12/05/14 thru 12/11/14  And that she would need a note saying to continue her grandfather care she will need her fmla extended 12/05/14 thru 12/11/14

## 2014-12-05 ENCOUNTER — Encounter: Payer: Self-pay | Admitting: Internal Medicine

## 2014-12-05 NOTE — Telephone Encounter (Signed)
granddaughter Ms Oscar Santiago is calling to follow up on FMLA extension request. Granddaughter is wanting extension and return to work on 11/08. Would like to know if another dr will do for her.  She sates it will be too late when dr burchette returns on Monday.  Ms Oscar Santiago would like a call back either way.

## 2014-12-05 NOTE — Telephone Encounter (Signed)
Called and left voicemail for patient to call back and make aware letter is ready to be picked up

## 2014-12-05 NOTE — Telephone Encounter (Signed)
Letter dictated and printed.

## 2014-12-06 ENCOUNTER — Telehealth: Payer: Self-pay | Admitting: Internal Medicine

## 2014-12-06 ENCOUNTER — Encounter: Payer: Self-pay | Admitting: *Deleted

## 2014-12-06 NOTE — Telephone Encounter (Signed)
Letter is upfront and ready to be picked up. Daughter is aware.

## 2014-12-06 NOTE — Telephone Encounter (Signed)
Letter has the wrong date for FMLA. She used FMLA for her husband in August. Her FMLA for her grandfather should have been Dec 04, 2014, not August. Needs a new letter please call when ready.

## 2014-12-07 ENCOUNTER — Ambulatory Visit: Payer: Medicare Other

## 2014-12-08 ENCOUNTER — Telehealth: Payer: Self-pay | Admitting: Internal Medicine

## 2014-12-08 ENCOUNTER — Other Ambulatory Visit: Payer: Self-pay

## 2014-12-08 NOTE — Telephone Encounter (Signed)
The nurse from [facility] called requesting that the order go to [facility] and she would follow up on it.

## 2014-12-08 NOTE — Telephone Encounter (Signed)
Ok for verbal order  °

## 2014-12-08 NOTE — Telephone Encounter (Signed)
Patient's granddaughter requested an order to have a blood draw for anemia.  A nurse is going to the house today from the [facility] and she can draw the blood if she has an order for it.

## 2014-12-08 NOTE — Telephone Encounter (Signed)
Left message on machine with verbal orders. 

## 2014-12-08 NOTE — Patient Outreach (Signed)
[facility] Care Management  12/08/2014  Oscar Santiago 05/15/1925 [id number]   Initial home visit for assessment for need for community care coordination. Patient and daughter very receptive to this scheduled home visit and readily assisted with responding to assessment tools. Daughter requested assistance with reporting increased blood in foley catheter collection bag.  Urine appeared dark red and concentrated.  Call made to Dr. Artist PaisYoo, was told by receptionist Dr. Artist PaisYoo was not in the office at this time, however, he was answering telephone calls, had received call earlier in the day when patient's daughter called.    [facility] Called, notified [facility] that Dr. Artist PaisYoo would probably be sending orders for blood collection, urine specimen.  This RNCM called later to follow up.  Spoke with [facility], was advised RN Deanna ArtisKeisha would be coming to collect specimens today.  Call made to granddaughter and patient to advise them that order had been received for [facility] for specimen collection.

## 2014-12-12 ENCOUNTER — Other Ambulatory Visit: Payer: Self-pay

## 2014-12-12 NOTE — Patient Outreach (Signed)
Unsuccessful attempt made to contact patient via telephone for community care coordination. Will make another attempt on Thursday, November 10.

## 2014-12-14 ENCOUNTER — Telehealth: Payer: Self-pay | Admitting: *Deleted

## 2014-12-14 NOTE — Telephone Encounter (Signed)
Per pt's grand daughter, Oscar Santiago, pt has new SOB & new/increased daytime somnolence. Remote does show ERI reached 11/14/14, mode reverted to VVI 65 (previous mode VVIR). Pt Had R. Carotid artery surgery on 10/23/14. Also has prostate growth into bladder. Oscar Santiago has been on FMLA for 45 days for pt. Her concern is scheduling an ERI appt sooner than Amber's 1st available which is 01/10/15. Dr. Jenel LucksAllred's schedule not available either.   Oscar Santiago is requesting a call back for an appt in November.

## 2014-12-16 ENCOUNTER — Telehealth: Payer: Self-pay

## 2014-12-16 NOTE — Telephone Encounter (Signed)
Spoke with pt granddaughter. She states he grandfather is acting confused and usually that is associated with a UTI. She states this was going to be handled from Dr. Olegario MessierYoo's office and never got a response back from when the nurse visited the home to collect the specimen. I have checked the chart and labs and there is no result. We are not sure if he has been DX'd with a UTI or if there is a further problem.  We do no have immediate access to do immediate care on a culture here. We could draw a UA but it would take a few hours to result.  it would be quicker to be seen in the ED or urgent care per Dr. Fabian SharpPanosh. Pt was informed, agreed to not be seen at the Saturday clinic and will take her grandfather to be seen today at another facility.

## 2014-12-18 ENCOUNTER — Ambulatory Visit (INDEPENDENT_AMBULATORY_CARE_PROVIDER_SITE_OTHER): Payer: Medicare Other | Admitting: Family Medicine

## 2014-12-18 ENCOUNTER — Telehealth: Payer: Self-pay

## 2014-12-18 ENCOUNTER — Encounter: Payer: Self-pay | Admitting: Family Medicine

## 2014-12-18 VITALS — BP 168/80 | HR 65 | Temp 98.2°F

## 2014-12-18 DIAGNOSIS — I639 Cerebral infarction, unspecified: Secondary | ICD-10-CM | POA: Diagnosis not present

## 2014-12-18 DIAGNOSIS — N390 Urinary tract infection, site not specified: Secondary | ICD-10-CM

## 2014-12-18 LAB — POCT URINALYSIS DIPSTICK
Bilirubin, UA: NEGATIVE
GLUCOSE UA: NEGATIVE
KETONES UA: NEGATIVE
Nitrite, UA: NEGATIVE
SPEC GRAV UA: 1.025
UROBILINOGEN UA: 0.2
pH, UA: 6

## 2014-12-18 MED ORDER — SULFAMETHOXAZOLE-TRIMETHOPRIM 800-160 MG PO TABS: 1.0000 | ORAL_TABLET | Freq: Two times a day (BID) | ORAL | Status: DC

## 2014-12-18 NOTE — Progress Notes (Signed)
Pre visit review using our clinic review tool, if applicable. No additional management support is needed unless otherwise documented below in the visit note. Pt unable to weigh 

## 2014-12-18 NOTE — Telephone Encounter (Signed)
[facility] Night - Client TELEPHONE ADVICE RECORD [facility] Patient Name: Oscar Santiago GOOD Gender: Male DOB: 09/02/1925 Age: 81 Y 9 M 3 D Return Phone Number: [phone] (Primary) Address: [place]Zip[place] [id number] Client [facility] Night - Client Client Site [facility] - Night Physician Artist PaisYoo, Doe Glenna FellowsHyun Robert "Rob" Contact Type Call Call Type Triage / Clinical Caller Name Valora PiccoloMikosha Gatson Relationship To Patient Grandparent Return Phone Number [phone] (Primary) Chief Complaint CONFUSION - new onset Initial Comment Caller stated her family member has UTI. She stated he dazed and confused this morning. She is wanting a prescription called in for him PreDisposition Call Doctor Nurse Assessment Nurse: Roderic OvensNorth, RN, Amy Date/Time Lamount Cohen(Eastern Time): 12/16/2014 10:11:50 AM Confirm and document reason for call. If symptomatic, describe symptoms. ---WOKE UP WITH CONFUSION THIS MORNING. FAMILY STATES DUE TO UTI. SHE IS WANTING ANTIBIOTICS CALLED IN FOR HIM. Has the patient traveled out of the country within the last 30 days? ---Not Applicable Does the patient have any new or worsening symptoms? ---Yes Will a triage be completed? ---Yes Related visit to physician within the last 2 weeks? ---Yes Does the PT have any chronic conditions? (i.e. diabetes, asthma, etc.) ---Unknown Guidelines Guideline Title Affirmed Question Affirmed Notes Nurse Date/Time Lamount Cohen(Eastern Time) Urination Pain - Male Patient sounds very sick or weak to the triager [place], RN, Amy 12/16/2014 10:13:17 AM Disp. Time Lamount Cohen(Eastern Time) Disposition Final User 12/16/2014 8:58:01 AM Send to Urgent Colin InaQueue Booth, Dante 12/16/2014 9:08:42 AM [facility] [place] 12/16/2014 10:10:43 AM Clinical Call Roderic OvensNorth, RN, Amy 12/16/2014 10:13:56 AM Go to ED Now (or PCP triage) Yes Roderic OvensNorth, RN, Amy Caller Understands:  Yes PLEASE NOTE: All timestamps contained within this report are represented as [place] Standard Time. CONFIDENTIALTY NOTICE: This fax transmission is intended only for the addressee. It contains information that is legally privileged, confidential or otherwise protected from use or disclosure. If you are not the intended recipient, you are strictly prohibited from reviewing, disclosing, copying using or disseminating any of this information or taking any action in reliance on or regarding this information. If you have received this fax in error, please notify us immediately by telephone so that we can arrange for its return to us. Phone: [phone], Toll-Free: [phone], Fax: [phone] Page: 2 of 2 Call Id: [id number]   Pt granddaughter denied to been seen last Saturday clinic but pt has an appt with Dr Clent RidgesFry today

## 2014-12-18 NOTE — Addendum Note (Signed)
Addended by: Aniceto BossNIMMONS, Lorilei Horan A on: 12/18/2014 03:11 PM   Modules accepted: Orders

## 2014-12-18 NOTE — Progress Notes (Signed)
   Subjective:    Patient ID: Oscar Santiago, male    DOB: 10/19/1925, 81 y.o.   MRN: [id number]  HPI Here for 4 days of weakness and chills. No fever or abdominal pain or nausea. He has an indwelling catheter.    Review of Systems  Constitutional: Positive for chills and fatigue. Negative for fever.  Respiratory: Negative.   Cardiovascular: Negative.   Gastrointestinal: Negative.   Genitourinary: Negative.        Objective:   Physical Exam  Constitutional: He appears well-developed and well-nourished.  Cardiovascular: Normal rate, regular rhythm, normal heart sounds and intact distal pulses.   Pulmonary/Chest: Effort normal and breath sounds normal.  Abdominal: Soft. Bowel sounds are normal. He exhibits no distension and no mass. There is no tenderness. There is no rebound and no guarding.          Assessment & Plan:  UTI, treat with Bactrim DS. He drinks plenty of water.

## 2014-12-19 ENCOUNTER — Telehealth: Payer: Self-pay | Admitting: Internal Medicine

## 2014-12-19 NOTE — Telephone Encounter (Signed)
Returned call.  Told granddaughter that I am sending to Drs Ladona Ridgelaylor and Allred to be scheduled.

## 2014-12-19 NOTE — Telephone Encounter (Signed)
New Message  Pt called states that they are ready to schedule that pacer battery change/shanti

## 2014-12-21 ENCOUNTER — Other Ambulatory Visit: Payer: Self-pay | Admitting: *Deleted

## 2014-12-21 DIAGNOSIS — I442 Atrioventricular block, complete: Secondary | ICD-10-CM

## 2014-12-21 DIAGNOSIS — Z45018 Encounter for adjustment and management of other part of cardiac pacemaker: Secondary | ICD-10-CM

## 2014-12-21 LAB — URINE CULTURE

## 2014-12-21 NOTE — Telephone Encounter (Signed)
Spoke with Junious DresserConnie and have him scheduled for 12/26/14 at 7:30am.  [facility]  He will be NPO after midnight.  He is still not currently on his Eliquis due to blood in urine.

## 2014-12-26 ENCOUNTER — Encounter (HOSPITAL_COMMUNITY)
Admission: RE | Disposition: A | Discharge status: Home / Self Care | Payer: Medicare Other | Source: Ambulatory Visit | Attending: Internal Medicine

## 2014-12-26 ENCOUNTER — Encounter (HOSPITAL_COMMUNITY): Payer: Self-pay | Admitting: Internal Medicine

## 2014-12-26 ENCOUNTER — Ambulatory Visit (HOSPITAL_COMMUNITY)
Admission: RE | Admit: 2014-12-26 | Discharge: 2014-12-26 | Disposition: A | Discharge status: Home / Self Care | Payer: Medicare Other | Source: Ambulatory Visit | Attending: Internal Medicine | Admitting: Internal Medicine

## 2014-12-26 DIAGNOSIS — I442 Atrioventricular block, complete: Secondary | ICD-10-CM

## 2014-12-26 DIAGNOSIS — I340 Nonrheumatic mitral (valve) insufficiency: Secondary | ICD-10-CM | POA: Diagnosis not present

## 2014-12-26 DIAGNOSIS — Z7982 Long term (current) use of aspirin: Secondary | ICD-10-CM | POA: Diagnosis not present

## 2014-12-26 DIAGNOSIS — M109 Gout, unspecified: Secondary | ICD-10-CM | POA: Insufficient documentation

## 2014-12-26 DIAGNOSIS — G40909 Epilepsy, unspecified, not intractable, without status epilepticus: Secondary | ICD-10-CM | POA: Insufficient documentation

## 2014-12-26 DIAGNOSIS — I129 Hypertensive chronic kidney disease with stage 1 through stage 4 chronic kidney disease, or unspecified chronic kidney disease: Secondary | ICD-10-CM | POA: Diagnosis not present

## 2014-12-26 DIAGNOSIS — I482 Chronic atrial fibrillation: Secondary | ICD-10-CM | POA: Insufficient documentation

## 2014-12-26 DIAGNOSIS — Z87442 Personal history of urinary calculi: Secondary | ICD-10-CM | POA: Diagnosis not present

## 2014-12-26 DIAGNOSIS — N183 Chronic kidney disease, stage 3 (moderate): Secondary | ICD-10-CM | POA: Diagnosis not present

## 2014-12-26 DIAGNOSIS — Z45010 Encounter for checking and testing of cardiac pacemaker pulse generator [battery]: Secondary | ICD-10-CM | POA: Insufficient documentation

## 2014-12-26 DIAGNOSIS — F329 Major depressive disorder, single episode, unspecified: Secondary | ICD-10-CM | POA: Diagnosis not present

## 2014-12-26 DIAGNOSIS — E039 Hypothyroidism, unspecified: Secondary | ICD-10-CM | POA: Diagnosis not present

## 2014-12-26 DIAGNOSIS — G4733 Obstructive sleep apnea (adult) (pediatric): Secondary | ICD-10-CM | POA: Diagnosis not present

## 2014-12-26 DIAGNOSIS — M8580 Other specified disorders of bone density and structure, unspecified site: Secondary | ICD-10-CM | POA: Insufficient documentation

## 2014-12-26 DIAGNOSIS — Z7951 Long term (current) use of inhaled steroids: Secondary | ICD-10-CM | POA: Diagnosis not present

## 2014-12-26 DIAGNOSIS — Z950 Presence of cardiac pacemaker: Secondary | ICD-10-CM | POA: Diagnosis present

## 2014-12-26 DIAGNOSIS — E785 Hyperlipidemia, unspecified: Secondary | ICD-10-CM | POA: Diagnosis not present

## 2014-12-26 DIAGNOSIS — Z45018 Encounter for adjustment and management of other part of cardiac pacemaker: Secondary | ICD-10-CM

## 2014-12-26 HISTORY — PX: EP IMPLANTABLE DEVICE: SHX172B

## 2014-12-26 LAB — CBC
HEMATOCRIT: 35.9 % — AB (ref 39.0–52.0)
HEMOGLOBIN: 12.2 g/dL — AB (ref 13.0–17.0)
MCH: 28.6 pg (ref 26.0–34.0)
MCHC: 34 g/dL (ref 30.0–36.0)
MCV: 84.3 fL (ref 78.0–100.0)
Platelets: 182 10*3/uL (ref 150–400)
RBC: 4.26 MIL/uL (ref 4.22–5.81)
RDW: 16 % — ABNORMAL HIGH (ref 11.5–15.5)
WBC: 3.7 10*3/uL — ABNORMAL LOW (ref 4.0–10.5)

## 2014-12-26 LAB — BASIC METABOLIC PANEL
Anion gap: 8 (ref 5–15)
BUN: 22 mg/dL — AB (ref 6–20)
CHLORIDE: 105 mmol/L (ref 101–111)
CO2: 23 mmol/L (ref 22–32)
CREATININE: 1.54 mg/dL — AB (ref 0.61–1.24)
Calcium: 9 mg/dL (ref 8.9–10.3)
GFR calc Af Amer: 45 mL/min — ABNORMAL LOW (ref >60)
GFR calc non Af Amer: 39 mL/min — ABNORMAL LOW (ref >60)
GLUCOSE: 89 mg/dL (ref 65–99)
POTASSIUM: 4.1 mmol/L (ref 3.5–5.1)
SODIUM: 136 mmol/L (ref 135–145)

## 2014-12-26 LAB — SURGICAL PCR SCREEN
MRSA, PCR: NEGATIVE
STAPHYLOCOCCUS AUREUS: NEGATIVE

## 2014-12-26 SURGERY — PPM/BIV PPM GENERATOR CHANGEOUT

## 2014-12-26 MED ORDER — CHLORHEXIDINE GLUCONATE 4 % EX LIQD
60.0000 mL | Freq: Once | CUTANEOUS | Status: DC
  Filled 2014-12-26: qty 60

## 2014-12-26 MED ORDER — MUPIROCIN 2 % EX OINT
1.0000 "application " | TOPICAL_OINTMENT | Freq: Once | CUTANEOUS | Status: AC
  Administered 2014-12-26: 1 via TOPICAL
  Filled 2014-12-26: qty 22

## 2014-12-26 MED ORDER — SODIUM CHLORIDE 0.9 % IR SOLN
80.0000 mg | Status: DC
  Filled 2014-12-26: qty 2

## 2014-12-26 MED ORDER — LIDOCAINE HCL (PF) 1 % IJ SOLN
INTRAMUSCULAR | Status: DC | PRN
  Administered 2014-12-26: 09:00:00

## 2014-12-26 MED ORDER — CEFAZOLIN SODIUM-DEXTROSE 2-3 GM-% IV SOLR: 2.0000 g | INTRAVENOUS | Status: DC

## 2014-12-26 MED ORDER — ACETAMINOPHEN 325 MG PO TABS: 325.0000 mg | ORAL_TABLET | ORAL | Status: DC | PRN

## 2014-12-26 MED ORDER — SODIUM CHLORIDE 0.9 % IR SOLN
Status: AC
  Filled 2014-12-26: qty 2

## 2014-12-26 MED ORDER — SODIUM CHLORIDE 0.9 % IV SOLN
INTRAVENOUS | Status: DC
  Administered 2014-12-26: 07:00:00 via INTRAVENOUS

## 2014-12-26 MED ORDER — CEFAZOLIN SODIUM-DEXTROSE 2-3 GM-% IV SOLR
INTRAVENOUS | Status: AC
  Filled 2014-12-26: qty 50

## 2014-12-26 MED ORDER — LIDOCAINE HCL (PF) 1 % IJ SOLN
INTRAMUSCULAR | Status: AC
  Filled 2014-12-26: qty 60

## 2014-12-26 MED ORDER — MUPIROCIN 2 % EX OINT
TOPICAL_OINTMENT | CUTANEOUS | Status: AC
  Administered 2014-12-26: 1 via TOPICAL
  Filled 2014-12-26: qty 22

## 2014-12-26 MED ORDER — ONDANSETRON HCL 4 MG/2ML IJ SOLN: 4.0000 mg | Freq: Four times a day (QID) | INTRAMUSCULAR | Status: DC | PRN

## 2014-12-26 MED ORDER — CEFAZOLIN SODIUM-DEXTROSE 2-3 GM-% IV SOLR
INTRAVENOUS | Status: DC | PRN
  Administered 2014-12-26: 2 g via INTRAVENOUS

## 2014-12-26 SURGICAL SUPPLY — 4 items
CABLE SURGICAL S-101-97-12 (CABLE) ×1 IMPLANT
PAD DEFIB LIFELINK (PAD) ×1 IMPLANT
PPM SENSIA SR SESR01 (Pacemaker) ×1 IMPLANT
TRAY PACEMAKER INSERTION (PACKS) ×1 IMPLANT

## 2014-12-26 NOTE — H&P (Signed)
History of Present Illness: Oscar Santiago is a 81 y.o. male who presents today for electrophysiology evaluation. He is doing well. 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 He remains active for his age.  Today, he denies symptoms of palpitations, chest pain, shortness of breath, orthopnea, PND, lower extremity edema, claudication, dizziness, presyncope, syncope, bleeding, or neurologic sequela. The patient is tolerating medications without difficulties and is otherwise without complaint today.    Past Medical History  Diagnosis Date  . Hypertension   . Seizure disorder   . Glaucoma   . Hyperlipidemia   . Osteopenia   . Compression fracture of spine     T 10 and T11  . Gout   . OSA (obstructive sleep apnea)     on CPAP therapy  . Hypothyroid   . Inguinal hernia   . Hemorrhoid   . Chronic systolic dysfunction of left ventricle     EF 30-35% by echo 10/10/2009  . Major depression   . History of GI bleed     from coumadin  . MR (mitral regurgitation)   . Hearing loss   . Dizziness     intermittent  . Kidney stone   . CKD (chronic kidney disease), stage III   . Permanent atrial fibrillation     refuses coumadin due to history of GI bleed  . Decreased appetite 03/02/12  . Weight loss 03/02/12  . Complete heart block    Past Surgical History  Procedure Laterality Date  . Transurethral resection of prostate    . Cardiac catheterization  2002  . Pacemaker insertion  07/28/06    VVI pacemaker for complete heart block by Dr Amil Amen  . Inguinal hernia repair    . Hemorrhoid surgery    . Colonoscopy with propofol N/A 05/01/2014    Procedure: COLONOSCOPY WITH PROPOFOL; Surgeon: [name], MD; Location: [facility]; Service: Endoscopy; Laterality: N/A;     Current Outpatient Prescriptions  Medication Sig Dispense Refill  .  alendronate (FOSAMAX) 70 MG tablet Take 70 mg by mouth once a week.     Marland Kitchen amLODipine (NORVASC) 10 MG tablet Take 5 mg by mouth daily as needed (when BP is in upper 80s-usually at bedtime).     Marland Kitchen aspirin 81 MG tablet Take 81 mg by mouth.    . brimonidine (ALPHAGAN P) 0.1 % SOLN Place 1 drop into both eyes 2 (two) times daily.     Marland Kitchen CHERRY PO Take 1 capsule by mouth daily.    . colchicine (COLCRYS) 0.6 MG tablet Take 0.6 mg by mouth daily as needed (for gout).     . Cyanocobalamin (VITAMIN B 12 PO) Take 1,000 mg by mouth daily.    . fluticasone (FLONASE) 50 MCG/ACT nasal spray Place 2 sprays into both nostrils at bedtime as needed for allergies or rhinitis. \    . furosemide (LASIX) 20 MG tablet Take 1 tablet (20 mg total) by mouth daily. 30 tablet 11  . latanoprost (XALATAN) 0.005 % ophthalmic solution Place 1 drop into both eyes at bedtime.     . levETIRAcetam (KEPPRA) 500 MG tablet Take 250-500 mg by mouth 2 (two) times daily.    Marland Kitchen levothyroxine (SYNTHROID, LEVOTHROID) 25 MCG tablet Take 1 tablet (25 mcg total) by mouth daily before breakfast. 90 tablet 1  . Multiple Vitamin (MULTIVITAMIN) capsule Take 1 capsule by mouth daily.    . valsartan (DIOVAN) 320 MG tablet Take 1 tablet (320 mg total) by mouth daily with  lunch. 30 tablet 11  . zolpidem (AMBIEN) 5 MG tablet Take 2.5 mg by mouth at bedtime as needed for sleep.     No current facility-administered medications for this visit.    Allergies: Coumadin and Lisinopril   Social History: The patient  reports that he has never smoked. He has never used smokeless tobacco. He reports that he does not drink alcohol or use illicit drugs.   Family History: The patient's family history includes Aneurysm in his brother; CVA in his sister; Heart failure in his mother. There is no history of Colon cancer or Colon polyps.    ROS: Please see the history of present  illness. All other systems are reviewed and negative.    PHYSICAL EXAM: VS: BP 112/76 mmHg  Pulse 62  Ht 5\' 9"  (1.753 m)  Wt 190 lb 3.2 oz (86.274 kg)  BMI 28.07 kg/m2 , BMI Body mass index is 28.07 kg/(m^2). GEN: Well nourished, well developed, in no acute distress  HEENT: normal  Neck: no JVD, carotid bruits, or masses Cardiac: RRR (paced) Respiratory: clear to auscultation bilaterally, normal work of breathing GI: soft, nontender, nondistended, + BS MS: no deformity or atrophy  Skin: warm and dry, device pocket is well healed Neuro: Strength and sensation are intact Psych: euthymic mood, full affect  Device interrogation is reviewed today in detail. See PaceArt for details.   Recent Labs: 02/21/2014: ALT 17; BUN 21; Creatinine 1.39; Hemoglobin 13.1; Platelets 191.0; Potassium 3.9; Sodium 134*; TSH 2.71    Lipid Panel   Labs (Brief)    No results found for: CHOL, TRIG, HDL, CHOLHDL, VLDL, LDLCALC, LDLDIRECT     Wt Readings from Last 3 Encounters:  05/15/14 190 lb 3.2 oz (86.274 kg)  04/12/14 189 lb (85.73 kg)  04/07/14 186 lb 12.8 oz (84.732 kg)      Other studies Reviewed: Additional studies/ records that were reviewed today include: Dr Lonn GeorgiaSmiths notes    ASSESSMENT AND PLAN:  1. Complete heart block Normal pacemaker function See Pace Art report No changes today 11 months estimated battery longevity I stressed the importance of compliance with monthly carelink interrogations with both patient and oldest granddaughter (with him today) My staff has educated then at length and they are enrolled in My Carelink Smart. Once ERI, will anticipate generator change. Given advanced age, not a candidate for device upgrade  2. Chronic systolic dysfunction euvolemic NYHA Class II  3. Permanent afib Not a candidate for anticoagulation due to GI bleeding  Current medicines are reviewed at length with the patient today.  The patient does not have  concerns regarding his medicines. The following changes were made today: none  Follow-up: carelink monthly, return to see EP NP in 1 year, follow-up with Dr Katrinka BlazingSmith as scheduled        EP Attending  Patient seen and examined. The patient with CHB has reached ERI. He has felt poorly since. We discussed the risks/benefits/goals/expectations of PPM generator removal and insertion of a new PPM and he wishes to proceed.   Leonia ReevesGregg Taylor,M.D.

## 2014-12-26 NOTE — Discharge Instructions (Signed)

## 2014-12-27 ENCOUNTER — Other Ambulatory Visit: Payer: Self-pay

## 2014-12-27 MED FILL — Sodium Chloride Irrigation Soln 0.9%: Qty: 500 | Status: AC

## 2014-12-27 MED FILL — Gentamicin Sulfate Inj 40 MG/ML: INTRAMUSCULAR | Qty: 2 | Status: AC

## 2014-12-27 NOTE — Patient Outreach (Signed)
This RNCM was successful in making contact with patient who identified himself by providing date of birth and address. Patient referred to his granddaughter Minokosa for telephonic assessment.  Minokosa stated patient is currently awaiting surgery for prostate cancer. Patient continues to wear foley catheter due to urinary retention.  Minokosa advised on community in home aide providers for assistance with patients ADLs and IADLs.  Patient's income does not allow for him to qualify for Medicaid PCS Program.  Minokosa and this RNCM reviewed patient's case management care plan and noted patient has met his goals. Minokosa advised of [facility]'s continual support and availability, however, case management would be closing this current episode.  Minokosa stated she has [facility]'s contact information if needed.    Plan: Discharge patient from my caseload due to his meeting care management goals, Send primary care physician update advising of discharge, .

## 2015-01-02 ENCOUNTER — Ambulatory Visit (INDEPENDENT_AMBULATORY_CARE_PROVIDER_SITE_OTHER): Payer: Medicare Other | Admitting: Neurology

## 2015-01-02 ENCOUNTER — Encounter: Payer: Self-pay | Admitting: Neurology

## 2015-01-02 VITALS — BP 145/75 | HR 60 | Ht 70.0 in | Wt 184.8 lb

## 2015-01-02 DIAGNOSIS — I480 Paroxysmal atrial fibrillation: Secondary | ICD-10-CM | POA: Diagnosis not present

## 2015-01-02 DIAGNOSIS — G40909 Epilepsy, unspecified, not intractable, without status epilepticus: Secondary | ICD-10-CM | POA: Diagnosis not present

## 2015-01-02 DIAGNOSIS — I639 Cerebral infarction, unspecified: Secondary | ICD-10-CM | POA: Diagnosis not present

## 2015-01-02 DIAGNOSIS — E785 Hyperlipidemia, unspecified: Secondary | ICD-10-CM

## 2015-01-02 DIAGNOSIS — I6523 Occlusion and stenosis of bilateral carotid arteries: Secondary | ICD-10-CM | POA: Diagnosis not present

## 2015-01-02 DIAGNOSIS — Z9889 Other specified postprocedural states: Secondary | ICD-10-CM

## 2015-01-02 DIAGNOSIS — G451 Carotid artery syndrome (hemispheric): Secondary | ICD-10-CM | POA: Diagnosis not present

## 2015-01-02 DIAGNOSIS — R319 Hematuria, unspecified: Secondary | ICD-10-CM | POA: Diagnosis not present

## 2015-01-02 DIAGNOSIS — I10 Essential (primary) hypertension: Secondary | ICD-10-CM

## 2015-01-02 NOTE — Patient Instructions (Signed)
-   continue ASA and pravastatin for stroke prevention - will check EEG - increase keppra to 500mg  twice a day for seizure control - will send note to Dr. Arbie CookeyEarly and Dr. Marcello Fennelannebaum for further review - please make appointment with Dr. Arbie CookeyEarly regarding the left ICA stenosis and upcoming urological procedure - continue to follow up with Dr. Marcello Fennelannebaum regarding potential procedure - once hematuria clears, and s/p urological procedure, will consider stronger blood thinner again - Follow up with your primary care physician for stroke risk factor modification. Recommend maintain blood pressure goal <130/80, diabetes with hemoglobin A1c goal below 6.5% and lipids with LDL cholesterol goal below 70 mg/dL.  - follow up in 2 months

## 2015-01-03 ENCOUNTER — Telehealth: Payer: Self-pay | Admitting: Internal Medicine

## 2015-01-03 DIAGNOSIS — Z9889 Other specified postprocedural states: Secondary | ICD-10-CM | POA: Insufficient documentation

## 2015-01-03 DIAGNOSIS — I480 Paroxysmal atrial fibrillation: Secondary | ICD-10-CM | POA: Insufficient documentation

## 2015-01-03 DIAGNOSIS — I10 Essential (primary) hypertension: Secondary | ICD-10-CM | POA: Insufficient documentation

## 2015-01-03 DIAGNOSIS — E785 Hyperlipidemia, unspecified: Secondary | ICD-10-CM | POA: Insufficient documentation

## 2015-01-03 DIAGNOSIS — I6529 Occlusion and stenosis of unspecified carotid artery: Secondary | ICD-10-CM | POA: Insufficient documentation

## 2015-01-03 MED ORDER — LEVETIRACETAM 500 MG PO TABS: 500.0000 mg | ORAL_TABLET | Freq: Two times a day (BID) | ORAL | Status: DC

## 2015-01-03 NOTE — Progress Notes (Signed)
STROKE NEUROLOGY FOLLOW UP NOTE  NAME: Oscar Santiago DOB: Feb 22, 1925  REASON FOR VISIT: stroke follow up HISTORY FROM: Daughter and chart  Today we had the pleasure of seeing OBADIAH DENNARD in follow-up at our Neurology Clinic. Pt was accompanied by daughter.   History Summary Mr. EWEN VARNELL is a 81 y.o. male with history of HTN, HLD, OSA on CPAP, Afib on ASA, heart block on pacemaker, partial seizure on kepprawas admitted on 10/20/14 for left arm and leg weakness. He did not receive IV t-PA due to rapid improving. CT repeat showed no acute abnormalities, and CTA head and neck showed right ICA 70% and left ICA 80% stenosis. TTE showed EF 40%, LDL 105 and A1c 5.9. Stroke etiology concerning for afib not on anticoagulation vs. large vessel atherosclerosis. Vascular surgery was consulted and patient had right CEA on 10/23/2014. Postprocedure, patient had urinary retention, Foley catheter was placed. However patient developed hematuria. Urology was consulted, concerning for prostate cancer with bladder metastasis. Patient continued to have on and off hematuria, likely due to frequent interaction between Foley catheter and bladder mass. Anticoagulation was never started, and patient is only on aspirin 81 due to hematuria. Patient was discharged to SNF after stabilization.  Interval History During the interval time, the patient has been doing well.  No recurrent stroke symptoms. Follow with Dr. Marcello Fennel from urology and considering bladder procedure and anesthesia. Patient has not follow-up with vascular surgery Dr. Arbie Cookey yet. As per daughter, patient had 2 episode of staring spells, last one 3 days ago. Patient is currently on Keppra 250 in a.m. and that 100 in p.m. BP 145/75 in clinic today.  REVIEW OF SYSTEMS: Full 14 system review of systems performed and notable only for those listed below and in HPI above, all others are negative:  Constitutional:  Weight loss, fatigue  Cardiovascular:    Ear/Nose/Throat:  Hearing loss Skin: Itching  Eyes:  Loss of vision  Respiratory:   Gastroitestinal:  Blood in stool, diarrhea  Genitourinary: Urination problems, blood in urine  Hematology/Lymphatic:   Endocrine: Feeding cold  Musculoskeletal:  Joint pain, aching muscles  Allergy/Immunology: Allergies, runny nose   Neurological:  Confusion  Psychiatric: Depression, decreased energy, disinterest in activities  Sleep:   The following represents the patient's updated allergies and side effects list: Allergies  Allergen Reactions  . Coumadin [Warfarin Sodium] Other (See Comments)    GI Bleed   . Warfarin Other (See Comments)    EXCESS BLDG W/COUMADIN EXCESS BLDG W/COUMADIN  . Lisinopril Cough    The neurologically relevant items on the patient's problem list were reviewed on today's visit.  Neurologic Examination  A problem focused neurological exam (12 or more points of the single system neurologic examination, vital signs counts as 1 point, cranial nerves count for 8 points) was performed.  Blood pressure 145/75, pulse 60, height  (1.778 m), weight 184 lb 12.8 oz (83.825 kg).  General - Well nourished, well developed, in no apparent distress.  Ophthalmologic - Fundi not visualized due to small pupils.  Cardiovascular - Regular rate and rhythm with no murmur.  Mental Status -  Level of arousal and orientation to time, place, and person were intact. Language including expression, naming, repetition, comprehension was assessed and found intact. Fund of Knowledge was assessed and was impaired  Cranial Nerves II - XII - II - Visual field intact OU. III, IV, VI - Extraocular movements intact. V - Facial sensation intact bilaterally. VII - Facial  movement intact bilaterally. VIII - Hearing & vestibular intact bilaterally. X - Palate elevates symmetrically. XI - Chin turning & shoulder shrug intact bilaterally. XII - Tongue protrusion intact.  Motor Strength -  The patient's strength was normal in all extremities and pronator drift was absent.  Bulk was normal and fasciculations were absent.   Motor Tone - Muscle tone was assessed at the neck and appendages and was normal.  Reflexes - The patient's reflexes were 1+ in all extremities and he had no pathological reflexes.  Sensory - Light touch, temperature/pinprick, vibration and proprioception, and Romberg testing were assessed and were normal.    Coordination - The patient had normal movements in the hands and feet with no ataxia or dysmetria.  Tremor was absent.  Gait and Station - walk with cane, slow and small stride.  Data reviewed: I personally reviewed the images and agree with the radiology interpretations.  Ct Angio Head and Neck W/cm &/or Wo Cm 10/20/2014  No CT or CTA evidence of acute intracranial infarction. 70% stenosis of the proximal right internal carotid artery. 80% stenosis of the proximal left internal carotid artery. Atherosclerotic disease in the carotid siphon regions with maximal stenosis estimated at 30-40% on both sides.   Dg Chest 2 View 10/20/2014  1. Stable cardiomegaly. No CHF. Cardiac pacer stable position.  2. Low lung volumes with mild bibasilar subsegmental atelectasis.   Ct Head Wo Contrast 10/20/2014  Moderate diffuse cortical atrophy. Minimal chronic ischemic white matter disease. No acute intracranial abnormality seen.   2D echo - - Left ventricle: Hypokinesis of the anterior wall and the anterior septum. The cavity size was normal. Wall thickness was increased in a pattern of mild LVH. The estimated ejection fraction was 40%. - Mitral valve: There was moderate regurgitation. - Left atrium: The atrium was severely dilated. - Right ventricle: The cavity size was mildly dilated. Systolic function was mildly reduced. - Right atrium: The atrium was mildly dilated. - Tricuspid valve: There was moderate regurgitation. -  Pulmonary arteries: PA peak pressure: 35 mm Hg (S). - Impressions: No cardiac source of embolism was identified, but cannot be ruled out on the basis of this examination. Impressions: - No cardiac source of embolism was identified, but cannot be ruled out on the basis of this examination.  EEG - Normal  Component     Latest Ref Rng 10/20/2014 10/21/2014  Cholesterol     0 - 200 mg/dL  161  Triglycerides     <150 mg/dL  55  HDL Cholesterol     >40 mg/dL  43  Total CHOL/HDL Ratio       3.7  VLDL     0 - 40 mg/dL  11  LDL (calc)     0 - 99 mg/dL  096 (H)  Hemoglobin E4V     4.8 - 5.6 %  5.9 (H)  Mean Plasma Glucose       123  Vitamin B-12     180 - 914 pg/mL 2477 (H)   Folate     >5.9 ng/mL 34.1     Assessment: As you may recall, he is a 81 y.o. African American male with PMH of HTN, HLD, OSA on CPAP, Afib on ASA, heart block on pacemaker, partial seizure on kepprawas admitted on 10/20/14 for left arm and leg weakness. He did not receive IV t-PA due to rapid improving. CT repeat showed no acute abnormalities, and CTA head and neck showed right ICA 70% and left ICA  80% stenosis. TTE showed EF 40%, LDL 105 and A1c 5.9. Stroke etiology concerning for afib not on anticoagulation vs. large vessel atherosclerosis. Vascular surgery was consulted and patient had right CEA on 10/23/2014. Post procedure, patient had urinary retention, Foley catheter was placed. However patient developed hematuria. Urology was consulted, concerning for prostate cancer with bladder metastasis. Patient continued to have on and off hematuria, likely due to frequent interaction between Foley catheter and bladder mass. Anticoagulation was never started, and patient is only on aspirin 81 due to hematuria. Patient was discharged to SNF after stabilization. Currently pt is pending urology procedure for hematuria, [facility]. Due to left ICA 80% stenosis, BP should be monitoring closely to avoid  hypotension during urology procedure and general anesthesia. Recommend to follow up with Dr. Arbie CookeyEarly for carotid stenosis before urology procedure. Increase keppar to 500mg  bid for seizure control. Will repeat EEG.   Plan:  - continue ASA and pravastatin for stroke prevention for now - will check EEG - increase keppra to 500mg  twice a day for seizure control - will send note to Dr. Arbie CookeyEarly and Dr. Marcello Fennelannebaum for review - recommend follow up with Dr. Arbie CookeyEarly regarding the left ICA stenosis and upcoming urological procedure before surgery - continue to follow up with Dr. Marcello Fennelannebaum regarding potential procedure - once hematuria clears, and s/p urological procedure, [facility] - Follow up with your primary care physician for stroke risk factor modification. Recommend maintain blood pressure goal <130/80, diabetes with hemoglobin A1c goal below 6.5% and lipids with LDL cholesterol goal below 70 mg/dL.  - follow up in 2 months  Orders Placed This Encounter  Procedures  . EEG adult    Standing Status: Future     Number of Occurrences:      Standing Expiration Date: 01/03/2016    Meds ordered this encounter  Medications  . levETIRAcetam (KEPPRA) 500 MG tablet    Sig: Take 1 tablet (500 mg total) by mouth 2 (two) times daily. Take 1/2 tablet (250 mg) by mouth every morning and 1 tablet (500 mg) every night    Dispense:  60 tablet    Refill:  3    Patient Instructions  - continue ASA and pravastatin for stroke prevention - will check EEG - increase keppra to 500mg  twice a day for seizure control - will send note to Dr. Arbie CookeyEarly and Dr. Marcello Fennelannebaum for further review - please make appointment with Dr. Arbie CookeyEarly regarding the left ICA stenosis and upcoming urological procedure - continue to follow up with Dr. Marcello Fennelannebaum regarding potential procedure - once hematuria clears, and s/p urological procedure, will consider stronger blood thinner again - Follow up with your primary care physician for  stroke risk factor modification. Recommend maintain blood pressure goal <130/80, diabetes with hemoglobin A1c goal below 6.5% and lipids with LDL cholesterol goal below 70 mg/dL.  - follow up in 2 months    Marvel PlanJindong Corona Popovich, MD PhD [facility] [address], [address] [place], [place] [address] [phone]

## 2015-01-03 NOTE — Telephone Encounter (Signed)
Oscar [facility]David [facility] [phone]) He needs a verbal order for Shelbie AmmonsHaywood Whitelaw to extend his therapy because he has been diagnosed with a UTI and had gotten weaker and needs strengthening therapy for 2 x a week for 3 weeks.

## 2015-01-03 NOTE — Telephone Encounter (Signed)
Returned patient's granddaughter's call.  She states that the patient has had dizzy spells, lack of energy, and has been stumbling when he walks since his PPM change-out on 12/26/14.  She is concerned because during his PT appointment this afternoon, his HR remained 60-64bpm according to the physical therapist.  She notes that previously, his HR would "get up higher".  Advised patient's granddaughter that we can check his device programming in office tomorrow, but that if his symptoms worsen overnight, they should proceed to the ED.  She is agreeable to this plan and voices understanding of instructions.  Per Gypsy BalsamAmber Seiler, NP, patient scheduled for 01/04/15 at 2:00pm with Francis Dowseenee Ursuy, PA.  Patient's granddaughter agreeable and denies additional questions or concerns.

## 2015-01-03 NOTE — Telephone Encounter (Signed)
New message     Pt had battery changeout recent.  Granddaughter states that his heart rate is 60-64.  He is complaining of dizzy spells, stumbling when he walks and no energy.  He is due for a wound check next Wednesday.  Please advise

## 2015-01-04 ENCOUNTER — Encounter: Payer: Self-pay | Admitting: Physician Assistant

## 2015-01-04 ENCOUNTER — Ambulatory Visit (INDEPENDENT_AMBULATORY_CARE_PROVIDER_SITE_OTHER): Payer: Medicare Other | Admitting: Physician Assistant

## 2015-01-04 VITALS — BP 130/80 | HR 68 | Ht 70.0 in | Wt 181.8 lb

## 2015-01-04 DIAGNOSIS — I639 Cerebral infarction, unspecified: Secondary | ICD-10-CM | POA: Diagnosis not present

## 2015-01-04 DIAGNOSIS — I482 Chronic atrial fibrillation, unspecified: Secondary | ICD-10-CM

## 2015-01-04 DIAGNOSIS — Z950 Presence of cardiac pacemaker: Secondary | ICD-10-CM

## 2015-01-04 DIAGNOSIS — I5022 Chronic systolic (congestive) heart failure: Secondary | ICD-10-CM | POA: Diagnosis not present

## 2015-01-04 DIAGNOSIS — I442 Atrioventricular block, complete: Secondary | ICD-10-CM | POA: Diagnosis not present

## 2015-01-04 LAB — CUP PACEART INCLINIC DEVICE CHECK
Implantable Lead Location: 753860
Implantable Lead Model: 4076
MDC IDC LEAD IMPLANT DT: 20080624
MDC IDC SESS DTM: 20161201174555

## 2015-01-04 NOTE — Telephone Encounter (Signed)
Verbal orders given to David.  °

## 2015-01-04 NOTE — Patient Instructions (Addendum)
Medication Instructions:   CONTINUE SAME MEDICATIONS  If you need a refill on your cardiac medications before your next appointment, please call your pharmacy.  Labwork: NONE ORDER TODAY   Testing/Procedures: NONE ORDER TODAY   Follow-Up:    AS SCHEDULED IN April  CALL OFFICE IN  MARCH 2017 TO SCHEDULE APPT   Remote monitoring is used to monitor your Pacemaker of ICD from home. This monitoring reduces the number of office visits required to check your device to one time per year. It allows us to keep an eye on the functioning of your device to ensure it is working properly. You are scheduled for a device check from home on .04/04/15 You may send your transmission at any time that day. If you have a wireless device, the transmission will be sent automatically. After your physician reviews your transmission, you will receive a postcard with your next transmission date.  Any Other Special Instructions Will Be Listed Below (If Applicable).

## 2015-01-04 NOTE — Telephone Encounter (Signed)
Ok for verbal order  °

## 2015-01-04 NOTE — Progress Notes (Signed)
Cardiology Office Note Date:  01/04/2015  Patient ID:  Oscar Santiago, Oscar Santiago 1925/11/12, MRN [id number] PCP:  [name], DO  Cardiologist:  Dr. Katrinka Blazing Electrophysiologist: Dr. Johney Frame   Chief Complaint: s/p PPM generator change concern regarding pacemaker function.  History of Present Illness: Oscar Santiago is a 81 y.o. male with history of CHB/PAFib with PPM, HTN, HLD, OSA on CPAP, CRI, chronic systolic CHF, seizure d/o, CVA,PVD with right CEA Sept 2016, comes to the office today feeling "OK", he is accompanied by his granddaughter.  He was reportedly c/o dizzy spells, though they deny this, state that when he walked with PT yesterday the therapist remarked that his HR stayed at 60 and they were concerned his new pacer might not be functioning correctly, stating that historically his HR would get to 70bpm.  The patient denies any SOB or dizziness or symptoms with ambulating with PT.  In regards to dizzy spells, these were more described as blank stares to the neurologist by his note yesterday, they mention fleeting loss of words, no near syncope or syncope, no falls, no feelings of lightheadness.   Past Medical History  Diagnosis Date  . Hypertension   . Seizure disorder (HCC)   . Glaucoma   . Hyperlipidemia   . Osteopenia   . Compression fracture of spine (HCC)     T 10 and T11  . Gout   . OSA (obstructive sleep apnea)     on CPAP therapy  . Hypothyroid   . Inguinal hernia   . Hemorrhoid   . Chronic systolic dysfunction of left ventricle     EF 30-35% by echo 10/10/2009  . Major depression (HCC)   . History of GI bleed     from coumadin  . MR (mitral regurgitation)   . Hearing loss   . Dizziness     intermittent  . Kidney stone   . CKD (chronic kidney disease), stage III   . Permanent atrial fibrillation (HCC)     refuses coumadin due to history of GI bleed  . Decreased appetite 03/02/12  . Weight loss 03/02/12  . Complete heart block (HCC)   . CHF (congestive heart  failure) (HCC)   . Seizures (HCC)   . Glaucoma   . Stroke [facility])     Past Surgical History  Procedure Laterality Date  . Transurethral resection of prostate    . Cardiac catheterization  2002  . Pacemaker insertion  07/28/06    VVI pacemaker for complete heart block by Dr Amil Amen  . Inguinal hernia repair    . Hemorrhoid surgery    . Colonoscopy with propofol N/A 05/01/2014    Procedure: COLONOSCOPY WITH PROPOFOL;  Surgeon: [name], MD;  Location: [facility] ENDOSCOPY;  Service: Endoscopy;  Laterality: N/A;  . Endarterectomy Right 10/23/2014    Procedure: ENDARTERECTOMY CAROTID;  Surgeon: Larina Earthly, MD;  Location: [facility] OR;  Service: Vascular;  Laterality: Right;  . Ep implantable device N/A 12/26/2014    Procedure: PPM Generator Changeout;  Surgeon: Marinus Maw, MD;  Location: [facility] INVASIVE CV LAB;  Service: Cardiovascular;  Laterality: N/A;    Current Outpatient Prescriptions  Medication Sig Dispense Refill  . acetaminophen (TYLENOL) 500 MG tablet Take 1 tablet (500 mg total) by mouth every 8 (eight) hours as needed (pain). 30 tablet 0  . alendronate (FOSAMAX) 70 MG tablet Take 70 mg by mouth every Monday.     Marland Kitchen amLODipine (NORVASC) 10 MG tablet Take 5 mg  by mouth daily as needed (diastolic blood pressure over 90).     Marland Kitchen. aspirin 81 MG tablet Take 81 mg by mouth daily.    . cetirizine (ZYRTEC) 10 MG tablet Take 10 mg by mouth daily.    . colchicine 0.6 MG tablet TAKE 1 TABLET TWICE DAILY (Patient taking differently: TAKE 1 TABLET TWICE DAILY AS NEEDED FOR GOUT) 30 tablet 4  . Dextromethorphan-Guaifenesin (CORICIDIN HBP CONGESTION/COUGH) 10-200 MG CAPS Take 1 capsule by mouth daily as needed (for cold).     . dorzolamide-timolol (COSOPT) 22.3-6.8 MG/ML ophthalmic solution Place 1 drop into both eyes 2 (two) times daily.  6  . ELIQUIS 5 MG TABS tablet Take 5 mg by mouth 2 (two) times daily.    . famotidine (PEPCID AC) 10 MG chewable tablet Chew 10 mg by mouth daily as needed for  heartburn.     . finasteride (PROSCAR) 5 MG tablet Take 1 tablet (5 mg total) by mouth daily. 30 tablet 0  . fluticasone (FLONASE) 50 MCG/ACT nasal spray Place 2 sprays into both nostrils daily as needed for allergies or rhinitis. \    . furosemide (LASIX) 20 MG tablet Take 1 tablet (20 mg total) by mouth daily. 30 tablet 11  . latanoprost (XALATAN) 0.005 % ophthalmic solution Place 1 drop into both eyes at bedtime.     . levETIRAcetam (KEPPRA) 500 MG tablet Take 1 tablet (500 mg total) by mouth 2 (two) times daily. Take 1/2 tablet (250 mg) by mouth every morning and 1 tablet (500 mg) every night 60 tablet 3  . levothyroxine (SYNTHROID, LEVOTHROID) 25 MCG tablet TAKE 1 TABLET (25 MCG TOTAL) BY MOUTH DAILY BEFORE BREAKFAST. 90 tablet 1  . megestrol (MEGACE) 40 MG/ML suspension 400 mg.  0  . Multiple Vitamin (MULTIVITAMIN WITH MINERALS) TABS tablet Take 1 tablet by mouth daily.    . pravastatin (PRAVACHOL) 20 MG tablet Take 20 mg by mouth daily.    Marland Kitchen. sulfamethoxazole-trimethoprim (BACTRIM DS,SEPTRA DS) 800-160 MG tablet Take 1 tablet by mouth 2 (two) times daily.  0  . tamsulosin (FLOMAX) 0.4 MG CAPS capsule Take 2 capsules (0.8 mg total) by mouth daily after breakfast. 30 capsule 0  . valsartan (DIOVAN) 320 MG tablet Take 1 tablet (320 mg total) by mouth daily with lunch. 30 tablet 11  . VIMPAT 50 MG TABS tablet Take 1 tablet by mouth daily.    . vitamin B-12 (CYANOCOBALAMIN) 1000 MCG tablet Take 1,000 mcg by mouth daily.    Marland Kitchen. zolpidem (AMBIEN) 5 MG tablet TAKE 1 TABLET AT BEDTIME 30 tablet 2   No current facility-administered medications for this visit.    Allergies:   Coumadin; Warfarin; and Lisinopril   Social History:  The patient  reports that he has never smoked. He has never used smokeless tobacco. He reports that he does not drink alcohol or use illicit drugs.   Family History:  The patient's family history includes Aneurysm in his brother; CVA in his brother and sister; Heart failure  in his mother; Transient ischemic attack in his daughter. There is no history of Colon cancer or Colon polyps.  ROS:  Please see the history of present illness.   All other systems are reviewed and otherwise negative.   PHYSICAL EXAM:  VS:  BP 130/80 mmHg  Pulse 68  Ht 5\' 10"  (1.778 m)  Wt 181 lb 12.8 oz (82.464 kg)  BMI 26.09 kg/m2 BMI: Body mass index is 26.09 kg/(m^2). Thin, though well nourished,elderly male,  in no acute distress HEENT: normocephalic, atraumatic Neck: no JVD, carotid bruits or masses Cardiac:  RRR (paced); 1-2/6 systolic murmurs, no rubs, or gallops Lungs:  clear to auscultation bilaterally, no wheezing, rhonchi or rales Abd: soft, nontender MS: no deformity or atrophy Ext: no edema Skin: warm and dry, no rash Neuro:  No gross deficits appreciated Psych: euthymic mood, full affect   PPM site is dry, no erythema or edema, no increased heat to the surrounding tissues.   EKG:  Done today shows V paced PPM check with normal function, single lead programmed VVIR  10/20/14 Echocardiogram Study Conclusions - Left ventricle: Hypokinesis of the anterior wall and the anterior septum. The cavity size was normal. Wall thickness was increased in a pattern of mild LVH. The estimated ejection fraction was 40%. - Mitral valve: There was moderate regurgitation. - Left atrium: The atrium was severely dilated. - Right ventricle: The cavity size was mildly dilated. Systolic function was mildly reduced. - Right atrium: The atrium was mildly dilated. - Tricuspid valve: There was moderate regurgitation. - Pulmonary arteries: PA peak pressure: 35 mm Hg (S). - Impressions: No cardiac source of embolism was identified, but cannot be ruled out on the basis of this examination. Impressions: - No cardiac source of embolism was identified, but cannot be ruled out on the basis of this examination.  Recent Labs: 02/21/2014: TSH 2.71 11/09/2014: ALT 35 12/26/2014: BUN  22*; Creatinine, Ser 1.54*; Hemoglobin 12.2*; Platelets 182; Potassium 4.1; Sodium 136  10/21/2014: Cholesterol 159; HDL 43; LDL Cholesterol 105*; Total CHOL/HDL Ratio 3.7; Triglycerides 55; VLDL 11   Estimated Creatinine Clearance: 34.9 mL/min (by C-G formula based on Cr of 1.54).   Wt Readings from Last 3 Encounters:  01/04/15 181 lb 12.8 oz (82.464 kg)  01/02/15 184 lb 12.8 oz (83.825 kg)  12/26/14 189 lb (85.73 kg)     Other studies reviewed: Additional studies/records reviewed today include: summarized above  DEVICE information:  PPM gen change single lead MDT 12/26/14, original device MDT 2008  ASSESSMENT AND PLAN:   1. Permanent Afib not on a/c     TIA September 2016 noting hematuria associated with bladder mass      Records also indicate not felt a candidate for a/c with GIB history  2.   Bladder mass/foley, hematuria        Pending urology procedure, though this is for his prostate and has been told they do not recommend intervention of the mass given his age   66. PVD, TIA Sept 2016     S/p right CEA Sept 2016, known 70% left     Dr. Arbie Cookey  4. PPM generator change     MDT device, checked today functioning normally  5. Seizure d/o     Saw neurology yesterday     rec reconsider a/c post urology procedure once hematuria clears and f/u with vascular regarding carotid stenosis  6. Dizzy spells, ?staring spells described by neurology Keppra was increased yesterday by neuro Neurology recommends avoid hypotension with carotid disease,  appears stable today  Disposition: F/u with vascular and neurology as recommended yesterday, continue with urology as well, f/u with Dr. Katrinka Blazing as directed by him, q3 month remote checks and EP APP f/u as scheduled, soner if needed.  Current medicines are reviewed at length with the patient today.  The patient did not have any concerns regarding medicines.  Judith Blonder, PA-C 01/04/2015 3:18 PM     [facility] [address]  [address] [place] [address] [phone] (office)  [phone] (fax)

## 2015-01-05 ENCOUNTER — Ambulatory Visit (INDEPENDENT_AMBULATORY_CARE_PROVIDER_SITE_OTHER): Payer: Medicare Other | Admitting: Diagnostic Neuroimaging

## 2015-01-05 DIAGNOSIS — G40909 Epilepsy, unspecified, not intractable, without status epilepticus: Secondary | ICD-10-CM

## 2015-01-09 ENCOUNTER — Telehealth: Payer: Self-pay

## 2015-01-09 NOTE — Telephone Encounter (Signed)
Rn call patients [facility] to fax over office notes per Dr.Xu request. Pt will be having some surgery on his prostate along with his bladder per his granddaughter. Rn was speaking with Elease Hashimotoatricia and stated that Dr.Sigmund Tannebaum needs to see the copy of the patients last office from Dr.Xu. Rn gave contact information, and number for any concerns. Elease Hashimotoatricia from office gave fax of [phone]. Number for [facility] is [phone].  Note fax and confirm.

## 2015-01-10 ENCOUNTER — Encounter: Payer: Self-pay | Admitting: Vascular Surgery

## 2015-01-10 ENCOUNTER — Ambulatory Visit: Payer: Medicare Other

## 2015-01-15 NOTE — Procedures (Signed)
   [facility]  EEG (ELECTROENCEPHALOGRAM) REPORT   STUDY DATE: 01/05/15 PATIENT NAME: Oscar Santiago DOB: 11/03/1925 MRN: [id number]  ORDERING CLINICIAN: Marvel PlanJindong Xu, MD PhD   TECHNOLOGIST: Gearldine ShownLorraine Jones  TECHNIQUE: Electroencephalogram was recorded utilizing standard 10-20 system of lead placement and reformatted into average and bipolar montages.  RECORDING TIME: 23 minutes ACTIVATION: photic stimulation  CLINICAL INFORMATION: 81 year old male with staring spells.  FINDINGS: Background rhythms of 8-9 hertz and 15-20 microvolts. No focal, lateralizing, epileptiform activity or seizures are seen. Patient recorded in the awake and drowsy state. EKG channel shows regular rhythm 78 beats per minute.   IMPRESSION:  Normal EEG in the awake and drowsy states.    INTERPRETING PHYSICIAN:  Suanne MarkerVIKRAM R. Ayaansh Smail, MD Certified in Neurology, Neurophysiology and Neuroimaging  [facility] [address], [address] [place], [place] [address] [phone]

## 2015-01-16 ENCOUNTER — Telehealth: Payer: Self-pay

## 2015-01-16 ENCOUNTER — Ambulatory Visit (INDEPENDENT_AMBULATORY_CARE_PROVIDER_SITE_OTHER): Payer: Self-pay | Admitting: Vascular Surgery

## 2015-01-16 ENCOUNTER — Encounter: Payer: Self-pay | Admitting: Vascular Surgery

## 2015-01-16 ENCOUNTER — Encounter: Payer: Self-pay | Admitting: Internal Medicine

## 2015-01-16 VITALS — BP 157/88 | HR 65 | Resp 18 | Ht 69.5 in | Wt 184.7 lb

## 2015-01-16 DIAGNOSIS — I6523 Occlusion and stenosis of bilateral carotid arteries: Secondary | ICD-10-CM

## 2015-01-16 NOTE — Progress Notes (Signed)
[facility] [place]  Patient name: Oscar Santiago MRN: [id number] DOB: 29-Nov-1925 Sex: male  REASON FOR VISIT:  Follow-up right carotid endarterectomy and discuss left carotid stenosis  HPI: Oscar Santiago is a 81 y.o. male  HEENT today for follow-up of right carotid endarterectomy for symptomatic carotid disease September 2016. He presented with a right brain stroke. After stabilization in the hospital he underwent uneventful right carotid endarterectomy and Dacron patch angioplasty. He has had continued improvement in his preoperative neurologic deficit. He reports that he feels that he is back to near normal in his left arm. He is able to walk but uses a cane for some stability. He has retired as a Education officer, environmental. He did have difficulty in his hospitalization with hematuria and no difficulty with voiding having to have a Foley catheter placed. He is undergone urologic evaluation apparently has has transurethral resection of prostate plan within the next several weeks. He has had no new neurologic deficits. Does have a history of seizure disorder  Past Medical History  Diagnosis Date  . Hypertension   . Seizure disorder (HCC)   . Glaucoma   . Hyperlipidemia   . Osteopenia   . Compression fracture of spine (HCC)     T 10 and T11  . Gout   . OSA (obstructive sleep apnea)     on CPAP therapy  . Hypothyroid   . Inguinal hernia   . Hemorrhoid   . Chronic systolic dysfunction of left ventricle     EF 30-35% by echo 10/10/2009  . Major depression (HCC)   . History of GI bleed     from coumadin  . MR (mitral regurgitation)   . Hearing loss   . Dizziness     intermittent  . Kidney stone   . CKD (chronic kidney disease), stage III   . Permanent atrial fibrillation (HCC)     refuses coumadin due to history of GI bleed  . Decreased appetite 03/02/12  . Weight loss 03/02/12  . Complete heart block (HCC)   . CHF (congestive heart failure) (HCC)   . Seizures (HCC)   .  Glaucoma   . Stroke [facility])     Family History  Problem Relation Age of Onset  . Heart failure Mother   . CVA Sister   . Aneurysm Brother     brain  . CVA Brother   . Colon cancer Neg Hx   . Colon polyps Neg Hx   . Transient ischemic attack Daughter     SOCIAL HISTORY: Social History  Substance Use Topics  . Smoking status: Never Smoker   . Smokeless tobacco: Never Used  . Alcohol Use: No    Allergies  Allergen Reactions  . Coumadin [Warfarin Sodium] Other (See Comments)    GI Bleed   . Warfarin Other (See Comments)    EXCESS BLDG W/COUMADIN EXCESS BLDG W/COUMADIN  . Lisinopril Cough    Current Outpatient Prescriptions  Medication Sig Dispense Refill  . acetaminophen (TYLENOL) 500 MG tablet Take 1 tablet (500 mg total) by mouth every 8 (eight) hours as needed (pain). 30 tablet 0  . alendronate (FOSAMAX) 70 MG tablet Take 70 mg by mouth every Monday.     Marland Kitchen amLODipine (NORVASC) 10 MG tablet Take 5 mg by mouth daily as needed (diastolic blood pressure over 90).     Marland Kitchen aspirin 81 MG tablet Take 81 mg by mouth daily.    . cetirizine (ZYRTEC) 10 MG tablet  Take 10 mg by mouth daily.    . colchicine 0.6 MG tablet TAKE 1 TABLET TWICE DAILY (Patient taking differently: TAKE 1 TABLET TWICE DAILY AS NEEDED FOR GOUT) 30 tablet 4  . dorzolamide-timolol (COSOPT) 22.3-6.8 MG/ML ophthalmic solution Place 1 drop into both eyes 2 (two) times daily.  6  . famotidine (PEPCID AC) 10 MG chewable tablet Chew 10 mg by mouth daily as needed for heartburn.     . finasteride (PROSCAR) 5 MG tablet Take 1 tablet (5 mg total) by mouth daily. 30 tablet 0  . fluticasone (FLONASE) 50 MCG/ACT nasal spray Place 2 sprays into both nostrils daily as needed for allergies or rhinitis. \    . furosemide (LASIX) 20 MG tablet Take 1 tablet (20 mg total) by mouth daily. 30 tablet 11  . latanoprost (XALATAN) 0.005 % ophthalmic solution Place 1 drop into both eyes at bedtime.     . levETIRAcetam (KEPPRA) 500 MG  tablet Take 1 tablet (500 mg total) by mouth 2 (two) times daily. Take 1/2 tablet (250 mg) by mouth every morning and 1 tablet (500 mg) every night 60 tablet 3  . levothyroxine (SYNTHROID, LEVOTHROID) 25 MCG tablet TAKE 1 TABLET (25 MCG TOTAL) BY MOUTH DAILY BEFORE BREAKFAST. 90 tablet 1  . Multiple Vitamin (MULTIVITAMIN WITH MINERALS) TABS tablet Take 1 tablet by mouth daily.    . pravastatin (PRAVACHOL) 20 MG tablet Take 20 mg by mouth daily.    Marland Kitchen. sulfamethoxazole-trimethoprim (BACTRIM DS,SEPTRA DS) 800-160 MG tablet Take 1 tablet by mouth 2 (two) times daily.  0  . tamsulosin (FLOMAX) 0.4 MG CAPS capsule Take 2 capsules (0.8 mg total) by mouth daily after breakfast. 30 capsule 0  . valsartan (DIOVAN) 320 MG tablet Take 1 tablet (320 mg total) by mouth daily with lunch. 30 tablet 11  . VIMPAT 50 MG TABS tablet Take 1 tablet by mouth daily.    . vitamin B-12 (CYANOCOBALAMIN) 1000 MCG tablet Take 1,000 mcg by mouth daily.    Marland Kitchen. zolpidem (AMBIEN) 5 MG tablet TAKE 1 TABLET AT BEDTIME 30 tablet 2  . Dextromethorphan-Guaifenesin (CORICIDIN HBP CONGESTION/COUGH) 10-200 MG CAPS Take 1 capsule by mouth daily as needed (for cold).     Marland Kitchen. ELIQUIS 5 MG TABS tablet Take 5 mg by mouth 2 (two) times daily.    . megestrol (MEGACE) 40 MG/ML suspension 400 mg.  0   No current facility-administered medications for this visit.                                                                                                                                                                   PHYSICAL EXAM: Filed Vitals:   01/16/15 0859 01/16/15 0905  BP: 169/76 157/88  Pulse: 65  Resp: 18   Height: 5' 9.5" (1.765 m)   Weight: 184 lb 11.2 oz (83.779 kg)   SpO2: 97%     GENERAL: The patient is a well-nourished male, in no acute distress. The vital signs are documented above. CARDIAC: There is a regular rate and rhythm.  VASCULAR:  2+ radial pulses bilaterally. Will right  carotid incision well healed. No bruits and his carotids bilaterally PULMONARY: There is good air exchange bilaterally without wheezing or rales. ABDOMEN: Soft and non-tender with normal pitched bowel sounds.  MUSCULOSKELETAL: There are no major deformities or cyanosis. NEUROLOGIC: No focal weakness or paresthesias are detected. SKIN: There are no ulcers or rashes noted. PSYCHIATRIC: The patient has a normal affect.  DATA:   did review his preoperative CT scan discussed this with the patient and his granddaughter present. This showed that he did have a proximal 70% right carotid stenosis prior to surgery and 80% left carotid stenosis. This is calcified and has a normal carotid bifurcation location with no stenosis of his internal carotid above this  MEDICAL ISSUES:  10 units stable recovery room regarding right brain stroke with no, dictation swallowing right carotid endarterectomy. Did discuss the high-grade greater than 80% stenosis of his left carotid stenosis. He is a 81 years old but is quite active. Reports that his brother lived into his late 30s. I did discuss the proximal 5% per year risk of neurologic deficit associated with this high-grade left carotid stenosis. I feel that he would be an acceptable risk for endarterectomy.  He does have the ongoing difficulty with prosthetic hypertrophy. Feel it probably makes more sense for him to undergo prostate surgery initially prior to his carotid surgery. His videoscopic patient following his last surgery was related to his eye urinary difficulty. Do not feel that it has a carotid stenosis which had been any increase risk regarding his prostate surgery. Would allow several weeks of recovery from this and then proceed with elective endarterectomy. Understands this would typically be a admission day surgery with one-step down unit and probable discharge in the morning. He will  Notify us when he wished to proceed with surgery  No Follow-up on  file.   [name] [facility] [place] Beeper: [phone]

## 2015-01-16 NOTE — Progress Notes (Signed)
Filed Vitals:   01/16/15 0859 01/16/15 0905  BP: 169/76 157/88  Pulse: 65   Resp: 18   Height: 5' 9.5" (1.765 m)   Weight: 184 lb 11.2 oz (83.779 kg)   SpO2: 97%

## 2015-01-16 NOTE — Telephone Encounter (Signed)
-----   Message from Marvel PlanJindong Xu, MD sent at 01/15/2015  6:24 PM EST ----- Could you please let the patient know that the EEG test done today in our office was normal EEG. Please continue current treatment. Thanks.  Marvel PlanJindong Xu, MD PhD Stroke Neurology 01/15/2015 6:24 PM

## 2015-01-16 NOTE — Telephone Encounter (Signed)
Rn call granddaughter Oscar Santiago about her grandfather EEG being normal. Pt has follow up with his vein vascular md and urologist. Pt will continue on keppra increase to 500 twice a day per Dr Roda ShuttersXu notes. [name] verbalized understanding.

## 2015-02-20 ENCOUNTER — Telehealth: Payer: Self-pay | Admitting: Internal Medicine

## 2015-02-20 NOTE — Telephone Encounter (Signed)
TELEPHONE ADVICE RECORD [facility]  Patient Name: Oscar Santiago  DOB: 1925-06-27    Initial Comment Caller stated she is wanting to schedule an appointment due to her grandfather's blood pressure being high (156/86)   Nurse Assessment  Nurse: Odis Luster, RN, Bjorn Loser Date/Time (Eastern Time): 02/20/2015 4:35:18 PM  Confirm and document reason for call. If symptomatic, describe symptoms. You must click the next button to save text entered. ---Caller stated she is wanting to schedule an appointment due to her grandfather's blood pressure being high (156/86) and he needs medical clearance for plastic surgery at the end of Feb. Reports that patient has been having some constipation/diarrhea issues. Patient takes Valsartin daily for bp.  Has the patient traveled out of the country within the last 30 days? ---No  Does the patient have any new or worsening symptoms? ---Yes  Will a triage be completed? ---Yes  Related visit to physician within the last 2 weeks? ---Yes  Does the PT have any chronic conditions? (i.e. diabetes, asthma, etc.) ---Yes  List chronic conditions. ---hypertension, a-fib, pacemaker, hx of seizures, enlarged prostate, slight kidney failure, osteoporosis, hx stroke.  Is this a behavioral health or substance abuse call? ---No     Guidelines    Guideline Title Affirmed Question Affirmed Notes  High Blood Pressure [1] BP ? 140/90 AND [2] taking BP medications    Final Disposition User   See PCP within 2 Blima Dessert, RN, Bjorn Loser    Comments  Scheduled follow up appt with Dr. Caryl Never on Friday, 02/23/15 @ 11:45 am. Advised to arrive at 11:30, caller voiced understanding.   Referrals  REFERRED TO PCP OFFICE   Disagree/Comply: Comply

## 2015-02-21 NOTE — Telephone Encounter (Signed)
FYI

## 2015-02-23 ENCOUNTER — Encounter: Payer: Self-pay | Admitting: Family Medicine

## 2015-02-23 ENCOUNTER — Ambulatory Visit (INDEPENDENT_AMBULATORY_CARE_PROVIDER_SITE_OTHER): Payer: Medicare Other | Admitting: Family Medicine

## 2015-02-23 VITALS — BP 150/80 | Temp 97.6°F | Ht 69.5 in | Wt 189.4 lb

## 2015-02-23 DIAGNOSIS — I10 Essential (primary) hypertension: Secondary | ICD-10-CM | POA: Diagnosis not present

## 2015-02-23 DIAGNOSIS — R339 Retention of urine, unspecified: Secondary | ICD-10-CM

## 2015-02-23 DIAGNOSIS — I5022 Chronic systolic (congestive) heart failure: Secondary | ICD-10-CM

## 2015-02-23 DIAGNOSIS — I482 Chronic atrial fibrillation, unspecified: Secondary | ICD-10-CM

## 2015-02-23 NOTE — Progress Notes (Signed)
Subjective:    Patient ID: Oscar Santiago, male    DOB: 1925/07/06, 82 y.o.   MRN: [id number]  HPI   Patient seen to evaluate hypertension. He has multiple chronic problems including history of atrial fibrillation, systolic heart failure , chronic kidney disease, history of complete heart block, hypertension, hyperlipidemia, hypothyroidism ,  Obstructive sleep apnea , history of cerebrovascular disease, BPH   Recent obstructive urinary symptoms. He is being scheduled in February for repeat TURP. He's at high risk with age and multiple medical problems above and apparently has been cleared by neurology. He had recent stroke and has not yet started Eliquis.   His blood pressures have been consistently around 150-160 systolic and 100 diastolic. Currently takes Diovan 320 mgs daily, amlodipine 5 mg daily, and Lasix 20 mg daily.  Past Medical History  Diagnosis Date  . Hypertension   . Seizure disorder (HCC)   . Glaucoma   . Hyperlipidemia   . Osteopenia   . Compression fracture of spine (HCC)     T 10 and T11  . Gout   . OSA (obstructive sleep apnea)     on CPAP therapy  . Hypothyroid   . Inguinal hernia   . Hemorrhoid   . Chronic systolic dysfunction of left ventricle     EF 30-35% by echo 10/10/2009  . Major depression (HCC)   . History of GI bleed     from coumadin  . MR (mitral regurgitation)   . Hearing loss   . Dizziness     intermittent  . Kidney stone   . CKD (chronic kidney disease), stage III   . Permanent atrial fibrillation (HCC)     refuses coumadin due to history of GI bleed  . Decreased appetite 03/02/12  . Weight loss 03/02/12  . Complete heart block (HCC)   . CHF (congestive heart failure) (HCC)   . Seizures (HCC)   . Glaucoma   . Stroke [facility])    Past Surgical History  Procedure Laterality Date  . Transurethral resection of prostate    . Cardiac catheterization  2002  . Pacemaker insertion  07/28/06    VVI pacemaker for complete heart block by Dr Amil Amen   . Inguinal hernia repair    . Hemorrhoid surgery    . Colonoscopy with propofol N/A 05/01/2014    Procedure: COLONOSCOPY WITH PROPOFOL;  Surgeon: [name], MD;  Location: [facility] ENDOSCOPY;  Service: Endoscopy;  Laterality: N/A;  . Endarterectomy Right 10/23/2014    Procedure: ENDARTERECTOMY CAROTID;  Surgeon: Larina Earthly, MD;  Location: [facility] OR;  Service: Vascular;  Laterality: Right;  . Ep implantable device N/A 12/26/2014    Procedure: PPM Generator Changeout;  Surgeon: Marinus Maw, MD;  Location: [facility] INVASIVE CV LAB;  Service: Cardiovascular;  Laterality: N/A;    reports that he has never smoked. He has never used smokeless tobacco. He reports that he does not drink alcohol or use illicit drugs. family history includes Aneurysm in his brother; CVA in his brother and sister; Heart failure in his mother; Transient ischemic attack in his daughter. There is no history of Colon cancer or Colon polyps. Allergies  Allergen Reactions  . Coumadin [Warfarin Sodium] Other (See Comments)    GI Bleed   . Warfarin Other (See Comments)    EXCESS BLDG W/COUMADIN EXCESS BLDG W/COUMADIN  . Lisinopril Cough      Review of Systems  Constitutional: Positive for fatigue.  Eyes: Negative for visual disturbance.  Respiratory: Negative for cough, chest tightness and shortness of breath.   Cardiovascular: Negative for chest pain, palpitations and leg swelling.  Gastrointestinal: Negative for nausea and vomiting.  Genitourinary: Negative for dysuria.  Neurological: Negative for dizziness, syncope, weakness, light-headedness and headaches.       Objective:   Physical Exam  Constitutional: He appears well-developed and well-nourished.  Neck: Neck supple. No thyromegaly present.  Cardiovascular: Normal rate.   Pulmonary/Chest: Effort normal and breath sounds normal. No respiratory distress. He has no wheezes. He has no rales.  Abdominal: Soft. There is no tenderness.  Musculoskeletal: He exhibits  no edema.  Neurological: He is alert.          Assessment & Plan:   #1 hypertension poorly controlled. Increase amlodipine to 10 mg daily.   Watch for increased edema. Family will continue to monitor blood pressure closely  #2 urinary outflow obstruction. Patient to have re--TURP per urology if cleared by neurology and cardiology.

## 2015-02-23 NOTE — Progress Notes (Signed)
Pre visit review using our clinic review tool, if applicable. No additional management support is needed unless otherwise documented below in the visit note. 

## 2015-02-23 NOTE — Patient Instructions (Signed)
Go ahead and increase the Amlodipine to 10 mg daily

## 2015-02-27 ENCOUNTER — Telehealth: Payer: Self-pay | Admitting: Interventional Cardiology

## 2015-02-27 NOTE — Telephone Encounter (Signed)
New Message  Pt gradndtr calling to speak w/ RN- has referral from Dr Everlena Cooper- was offered appt w/ APP, requested to speak w/ RN. Please call back and discuss.

## 2015-02-28 NOTE — Telephone Encounter (Signed)
Returned call to  Pt daughter Oscar Santiago. She sts that pt is doing ok from acardiac standpoint, but needs an appt for pre-op surgical clearance. Pt sx is tbd, but will be scheduled with Dr.Tannebaum. Appt scheduled on 2/8 @ 3:15pm with Dr.Smith. Oscar Santiago voiced appreciation and verbalized understanding

## 2015-03-13 ENCOUNTER — Encounter: Payer: Self-pay | Admitting: Neurology

## 2015-03-13 ENCOUNTER — Ambulatory Visit (INDEPENDENT_AMBULATORY_CARE_PROVIDER_SITE_OTHER): Payer: Medicare Other | Admitting: Neurology

## 2015-03-13 VITALS — BP 150/77 | HR 66 | Ht 69.5 in | Wt 187.0 lb

## 2015-03-13 DIAGNOSIS — I10 Essential (primary) hypertension: Secondary | ICD-10-CM

## 2015-03-13 DIAGNOSIS — G40909 Epilepsy, unspecified, not intractable, without status epilepticus: Secondary | ICD-10-CM

## 2015-03-13 DIAGNOSIS — I480 Paroxysmal atrial fibrillation: Secondary | ICD-10-CM

## 2015-03-13 DIAGNOSIS — I6523 Occlusion and stenosis of bilateral carotid arteries: Secondary | ICD-10-CM

## 2015-03-13 DIAGNOSIS — G451 Carotid artery syndrome (hemispheric): Secondary | ICD-10-CM

## 2015-03-13 DIAGNOSIS — Z9889 Other specified postprocedural states: Secondary | ICD-10-CM

## 2015-03-13 DIAGNOSIS — E785 Hyperlipidemia, unspecified: Secondary | ICD-10-CM

## 2015-03-13 NOTE — Patient Instructions (Addendum)
-   continue ASA and pravastatin for stroke prevention - continue keppra for seizure control - will do carotid doppler to evaluate carotid status - continue to follow up with Dr. Marcello Fennel regarding potential procedure. Let us know the result from tomorrow. May consider left carotid surgery first if urology procedure can wait. - once hematuria clears, and s/p urological procedure, may consider stronger blood thinner again - Follow up with your primary care physician for stroke risk factor modification. Recommend maintain blood pressure goal <130/80, diabetes with hemoglobin A1c goal below 6.5% and lipids with LDL cholesterol goal below 70 mg/dL.  - follow up in 2 months

## 2015-03-13 NOTE — Progress Notes (Signed)
STROKE NEUROLOGY FOLLOW UP NOTE  NAME: Oscar Santiago DOB: 1925-06-01  REASON FOR VISIT: stroke follow up HISTORY FROM: Daughter and chart  Today we had the pleasure of seeing Oscar Santiago in follow-up at our Neurology Clinic. Pt was accompanied by daughter.   History Summary [name] is a 82 y.o. male with history of HTN, HLD, OSA on CPAP, Afib on ASA, heart block on pacemaker, partial seizure on kepprawas admitted on 10/20/14 for left arm and leg weakness. He did not receive IV t-PA due to rapid improving. CT repeat showed no acute abnormalities, and CTA head and neck showed right ICA 70% and left ICA 80% stenosis. TTE showed EF 40%, LDL 105 and A1c 5.9. Stroke etiology concerning for afib not on anticoagulation vs. large vessel atherosclerosis. Vascular surgery was consulted and patient had right CEA on 10/23/2014. Postprocedure, patient had urinary retention, Foley catheter was placed. However patient developed hematuria. Urology was consulted, concerning for prostate cancer with bladder metastasis. Patient continued to have on and off hematuria, likely due to frequent interaction between Foley catheter and bladder mass. Anticoagulation was never started, and patient is only on aspirin 81 due to hematuria. Patient was discharged to SNF after stabilization.  01/02/15 follow up -  the patient has been doing well.  No recurrent stroke symptoms. Follow with Dr. Marcello Fennel from urology and considering bladder procedure and anesthesia. Patient has not follow-up with vascular surgery Dr. Arbie Cookey yet. As per daughter, patient had 2 episode of staring spells, last one 3 days ago. Patient is currently on Keppra 250 in a.m. and 500 in p.m. BP 145/75 in clinic today.  Interval History During the interval time, pt has been doing well. No stroke like symptoms or seizure activities. Followed with urology, Foley catheter removed, however, found to have continued urinary retention. Planning for  further procedure, after clearance from neurology and cardiology. Patient has followed with Dr. Arbie Cookey in vascular surgery s/p right CEA in 10/2014, would consider left CEA 6 months apart. He does not think current left ICA 80% stenosis poses significant risk for urological surgery. Has appointment with cardiology soon. Her blood pressure fluctuating at home, today 150/77 in clinic.  REVIEW OF SYSTEMS: Full 14 system review of systems performed and notable only for those listed below and in HPI above, all others are negative:  Constitutional:    Cardiovascular:  Ear/Nose/Throat:  Runny nose, drooling Skin:  Eyes:  Blurry vision Respiratory:  Cough Gastroitestinal:  Blood in stool, diarrhea, constipation  Genitourinary:  Hematology/Lymphatic:   Endocrine: Cold intolerance  Musculoskeletal:  Back pain  Allergy/Immunology:    Neurological:  Weakness, tremor  Psychiatric:  Sleep:   The following represents the patient's updated allergies and side effects list: Allergies  Allergen Reactions  . Coumadin [Warfarin Sodium] Other (See Comments)    GI Bleed   . Warfarin Other (See Comments)    EXCESS BLDG W/COUMADIN EXCESS BLDG W/COUMADIN  . Lisinopril Cough    The neurologically relevant items on the patient's problem list were reviewed on today's visit.  Neurologic Examination  A problem focused neurological exam (12 or more points of the single system neurologic examination, vital signs counts as 1 point, cranial nerves count for 8 points) was performed.  Blood pressure 150/77, pulse 66, height 5' 9.5" (1.765 m), weight 187 lb (84.823 kg).  General - Well nourished, well developed, in no apparent distress.  Ophthalmologic - Fundi not visualized due to small pupils.  Cardiovascular - Regular  rate and rhythm with no murmur.  Mental Status -  Level of arousal and orientation to time, place, and person were intact. Language including expression, naming, repetition, comprehension was  assessed and found intact. Fund of Knowledge was assessed and was impaired  Cranial Nerves II - XII - II - Visual field intact OU. III, IV, VI - Extraocular movements intact. V - Facial sensation intact bilaterally. VII - Facial movement intact bilaterally. VIII - Hearing & vestibular intact bilaterally. X - Palate elevates symmetrically. XI - Chin turning & shoulder shrug intact bilaterally. XII - Tongue protrusion intact.  Motor Strength - The patient's strength was normal in all extremities and pronator drift was absent.  Bulk was normal and fasciculations were absent.   Motor Tone - Muscle tone was assessed at the neck and appendages and was normal.  Reflexes - The patient's reflexes were 1+ in all extremities and he had no pathological reflexes.  Sensory - Light touch, temperature/pinprick, vibration and proprioception, and Romberg testing were assessed and were normal.    Coordination - The patient had normal movements in the hands and feet with no ataxia or dysmetria.  Tremor was absent.  Gait and Station - walk with cane, slow and small stride.  Data reviewed: I personally reviewed the images and agree with the radiology interpretations.  Ct Angio Head and Neck W/cm &/or Wo Cm 10/20/2014  No CT or CTA evidence of acute intracranial infarction. 70% stenosis of the proximal right internal carotid artery. 80% stenosis of the proximal left internal carotid artery. Atherosclerotic disease in the carotid siphon regions with maximal stenosis estimated at 30-40% on both sides.   Dg Chest 2 View 10/20/2014  1. Stable cardiomegaly. No CHF. Cardiac pacer stable position.  2. Low lung volumes with mild bibasilar subsegmental atelectasis.   Ct Head Wo Contrast 10/20/2014  Moderate diffuse cortical atrophy. Minimal chronic ischemic white matter disease. No acute intracranial abnormality seen.   2D echo - - Left ventricle: Hypokinesis of the anterior wall and the  anterior septum. The cavity size was normal. Wall thickness was increased in a pattern of mild LVH. The estimated ejection fraction was 40%. - Mitral valve: There was moderate regurgitation. - Left atrium: The atrium was severely dilated. - Right ventricle: The cavity size was mildly dilated. Systolic function was mildly reduced. - Right atrium: The atrium was mildly dilated. - Tricuspid valve: There was moderate regurgitation. - Pulmonary arteries: PA peak pressure: 35 mm Hg (S). - Impressions: No cardiac source of embolism was identified, but cannot be ruled out on the basis of this examination. Impressions: - No cardiac source of embolism was identified, but cannot be ruled out on the basis of this examination.  EEG 10/20/2014 - Normal  Repeat EEG 01/15/2015- normal  Component     Latest Ref Rng 10/20/2014 10/21/2014  Cholesterol     0 - 200 mg/dL  914  Triglycerides     <150 mg/dL  55  HDL Cholesterol     >40 mg/dL  43  Total CHOL/HDL Ratio       3.7  VLDL     0 - 40 mg/dL  11  LDL (calc)     0 - 99 mg/dL  782 (H)  Hemoglobin N5A     4.8 - 5.6 %  5.9 (H)  Mean Plasma Glucose       123  Vitamin B-12     180 - 914 pg/mL 2477 (H)   Folate     >  5.9 ng/mL 34.1     Assessment: As you may recall, he is a 82 y.o. African American male with PMH of HTN, HLD, OSA on CPAP, Afib on ASA, heart block on pacemaker, partial seizure on kepprawas admitted on 10/20/14 for left arm and leg weakness. He did not receive IV t-PA due to rapid improving. CT repeat showed no acute abnormalities, and CTA head and neck showed right ICA 70% and left ICA 80% stenosis. TTE showed EF 40%, LDL 105 and A1c 5.9. Stroke etiology concerning for afib not on anticoagulation vs. large vessel atherosclerosis. Vascular surgery was consulted and patient had right CEA on 10/23/2014. Post procedure, patient had urinary retention, Foley catheter was placed. However patient developed hematuria. Urology  was consulted, concerning for prostate cancer with bladder metastasis. Patient continued to have on and off hematuria, likely due to frequent interaction between Foley catheter and bladder mass. Anticoagulation was never started, and patient is only on aspirin 81 due to hematuria. Patient was discharged to SNF after stabilization. Currently pt is pending urology procedure for hematuria/urinary retention, not a candidate for [facility] at this time. Due to left ICA 80% stenosis, BP should be monitoring closely to avoid hypotension during urology procedure and general anesthesia. Follow up with Dr. Arbie Cookey and would consider left CEA electively. Will repeat carotid Doppler. On Keppra 500 mg twice a day and repeat EEG normal.   Plan:  - continue ASA and pravastatin for stroke prevention - continue keppra for seizure control - Repeat carotid doppler to evaluate carotid artery status post CEA - continue to follow up with Dr. Marcello Fennel regarding the timing of urological procedure. If not urgent, may postpone after left CEA. If urgent, OK to proceed before left CEA but recommend close monitor BP to avoid hypotension during surgery and general anesthesia. - once hematuria clears, and s/p urological procedure, may consider stronger blood thinner again - Follow up with your primary care physician for stroke risk factor modification. Recommend maintain blood pressure goal <130/80, diabetes with hemoglobin A1c goal below 6.5% and lipids with LDL cholesterol goal below 70 mg/dL.  - follow up in 2-3 months  I spent more than 25 minutes of face to face time with the patient. Greater than 50% of time was spent in counseling and coordination of care, reviewing test results and medication, and discussing about timing for surgery and  Perioperative management.   No orders of the defined types were placed in this encounter.   No orders of the defined types were placed in this encounter.    Patient Instructions  - continue  ASA and pravastatin for stroke prevention - continue keppra for seizure control - will do carotid doppler to evaluate carotid status - continue to follow up with Dr. Marcello Fennel regarding potential procedure. Let us know the result from tomorrow. May consider left carotid surgery first if urology procedure can wait. - once hematuria clears, and s/p urological procedure, may consider stronger blood thinner again - Follow up with your primary care physician for stroke risk factor modification. Recommend maintain blood pressure goal <130/80, diabetes with hemoglobin A1c goal below 6.5% and lipids with LDL cholesterol goal below 70 mg/dL.  - follow up in 2 months    Marvel Plan, MD PhD [facility] [address], [address] [place], [place] [address] [phone]

## 2015-03-14 ENCOUNTER — Encounter: Payer: Self-pay | Admitting: Interventional Cardiology

## 2015-03-14 ENCOUNTER — Ambulatory Visit (INDEPENDENT_AMBULATORY_CARE_PROVIDER_SITE_OTHER): Payer: Medicare Other | Admitting: Interventional Cardiology

## 2015-03-14 VITALS — BP 140/70 | HR 78 | Ht 69.0 in | Wt 186.4 lb

## 2015-03-14 DIAGNOSIS — G4733 Obstructive sleep apnea (adult) (pediatric): Secondary | ICD-10-CM

## 2015-03-14 DIAGNOSIS — I519 Heart disease, unspecified: Secondary | ICD-10-CM

## 2015-03-14 DIAGNOSIS — I6523 Occlusion and stenosis of bilateral carotid arteries: Secondary | ICD-10-CM | POA: Diagnosis not present

## 2015-03-14 DIAGNOSIS — I442 Atrioventricular block, complete: Secondary | ICD-10-CM | POA: Diagnosis not present

## 2015-03-14 DIAGNOSIS — N183 Chronic kidney disease, stage 3 unspecified: Secondary | ICD-10-CM

## 2015-03-14 DIAGNOSIS — Z01818 Encounter for other preprocedural examination: Secondary | ICD-10-CM

## 2015-03-14 DIAGNOSIS — I480 Paroxysmal atrial fibrillation: Secondary | ICD-10-CM

## 2015-03-14 DIAGNOSIS — I10 Essential (primary) hypertension: Secondary | ICD-10-CM

## 2015-03-14 DIAGNOSIS — Z950 Presence of cardiac pacemaker: Secondary | ICD-10-CM | POA: Diagnosis not present

## 2015-03-14 DIAGNOSIS — Z9889 Other specified postprocedural states: Secondary | ICD-10-CM

## 2015-03-14 NOTE — Progress Notes (Signed)
Cardiology Office Note   Date:  03/14/2015   ID:  Oscar Santiago, DOB Apr 27, 1925, MRN [id number]  PCP:  [name], DO  Cardiologist:  Lesleigh Noe, MD   Chief Complaint  Patient presents with  . Pre-op Exam      History of Present Illness: Oscar Santiago is a 82 y.o. male who presents for essential hypertension, paroxysmal atrial fibrillation, chronic systolic heart failure, transient ischemic attack, carotid disease with right carotid endarterectomy, complicated by stroke, complete heart block with DDD pacemaker,, and obstructive sleep apnea.  Oscar Santiago is doing well. He denies chest pain. He has not noted lower extremity swelling. He is here today for clearance for a pending urologic procedure by Dr. Cassell Smiles. He has had difficulty with hematuria when on anticoagulation therapy for presumed embolic CVA. Anticoagulation is been discontinued and he is only on aspirin. He did undergo right carotid endarterectomy. Hematuria has ceased off Eliqus.  He denies orthopnea, palpitations, lower extremity edema, and angina. He has not needed nitroglycerin. Last echo performed in September 2016 revealed anterior wall hypokinesis and an ejection fraction of 40%. LVH was also noted. No recent myocardial perfusion imaging was noted.     Past Medical History  Diagnosis Date  . Hypertension   . Seizure disorder (HCC)   . Glaucoma   . Hyperlipidemia   . Osteopenia   . Compression fracture of spine (HCC)     T 10 and T11  . Gout   . OSA (obstructive sleep apnea)     on CPAP therapy  . Hypothyroid   . Inguinal hernia   . Hemorrhoid   . Chronic systolic dysfunction of left ventricle     EF 30-35% by echo 10/10/2009  . Major depression (HCC)   . History of GI bleed     from coumadin  . MR (mitral regurgitation)   . Hearing loss   . Dizziness     intermittent  . Kidney stone   . CKD (chronic kidney disease), stage III   . Permanent atrial fibrillation (HCC)     refuses  coumadin due to history of GI bleed  . Decreased appetite 03/02/12  . Weight loss 03/02/12  . Complete heart block (HCC)   . CHF (congestive heart failure) (HCC)   . Seizures (HCC)   . Glaucoma   . Stroke [facility])     Past Surgical History  Procedure Laterality Date  . Transurethral resection of prostate    . Cardiac catheterization  2002  . Pacemaker insertion  07/28/06    VVI pacemaker for complete heart block by Dr Amil Amen  . Inguinal hernia repair    . Hemorrhoid surgery    . Colonoscopy with propofol N/A 05/01/2014    Procedure: COLONOSCOPY WITH PROPOFOL;  Surgeon: [name], MD;  Location: [facility] ENDOSCOPY;  Service: Endoscopy;  Laterality: N/A;  . Endarterectomy Right 10/23/2014    Procedure: ENDARTERECTOMY CAROTID;  Surgeon: Larina Earthly, MD;  Location: [facility] OR;  Service: Vascular;  Laterality: Right;  . Ep implantable device N/A 12/26/2014    Procedure: PPM Generator Changeout;  Surgeon: Marinus Maw, MD;  Location[facility] INVASIVE CV LAB;  Service: Cardiovascular;  Laterality: N/A;     Current Outpatient Prescriptions  Medication Sig Dispense Refill  . acetaminophen (TYLENOL) 500 MG tablet Take 1 tablet (500 mg total) by mouth every 8 (eight) hours as needed (pain). 30 tablet 0  . alendronate (FOSAMAX) 70 MG tablet Take 70 mg by  mouth every Monday.     Marland Kitchen amLODipine (NORVASC) 10 MG tablet Take 10 mg by mouth daily.     Marland Kitchen aspirin 81 MG tablet Take 81 mg by mouth daily.    . cetirizine (ZYRTEC) 10 MG tablet Take 10 mg by mouth daily.    . colchicine 0.6 MG tablet Take 0.6 mg by mouth 2 (two) times daily as needed (gout).    . Dextromethorphan-Guaifenesin (CORICIDIN HBP CONGESTION/COUGH) 10-200 MG CAPS Take 1 capsule by mouth daily as needed (for cold).     . dorzolamide-timolol (COSOPT) 22.3-6.8 MG/ML ophthalmic solution Place 1 drop into both eyes 2 (two) times daily.  6  . ELIQUIS 5 MG TABS tablet Take 5 mg by mouth 2 (two) times daily. Reported on 03/13/2015    . famotidine (PEPCID  AC) 10 MG chewable tablet Chew 10 mg by mouth daily as needed for heartburn.     . finasteride (PROSCAR) 5 MG tablet Take 1 tablet (5 mg total) by mouth daily. 30 tablet 0  . fluticasone (FLONASE) 50 MCG/ACT nasal spray Place 2 sprays into both nostrils daily as needed for allergies or rhinitis. \    . furosemide (LASIX) 20 MG tablet Take 1 tablet (20 mg total) by mouth daily. 30 tablet 11  . latanoprost (XALATAN) 0.005 % ophthalmic solution Place 1 drop into both eyes at bedtime.     . levETIRAcetam (KEPPRA) 500 MG tablet Take 1 tablet (500 mg total) by mouth 2 (two) times daily. Take 1/2 tablet (250 mg) by mouth every morning and 1 tablet (500 mg) every night 60 tablet 3  . levothyroxine (SYNTHROID, LEVOTHROID) 25 MCG tablet TAKE 1 TABLET (25 MCG TOTAL) BY MOUTH DAILY BEFORE BREAKFAST. 90 tablet 1  . Multiple Vitamin (MULTIVITAMIN WITH MINERALS) TABS tablet Take 1 tablet by mouth daily.    . pravastatin (PRAVACHOL) 20 MG tablet Take 20 mg by mouth daily.    . tamsulosin (FLOMAX) 0.4 MG CAPS capsule Take 2 capsules (0.8 mg total) by mouth daily after breakfast. 30 capsule 0  . valsartan (DIOVAN) 320 MG tablet Take 1 tablet (320 mg total) by mouth daily with lunch. 30 tablet 11  . VIMPAT 50 MG TABS tablet Take 1 tablet by mouth daily. Reported on 03/13/2015    . vitamin B-12 (CYANOCOBALAMIN) 1000 MCG tablet Take 1,000 mcg by mouth daily.    Marland Kitchen zolpidem (AMBIEN) 5 MG tablet Take 5 mg by mouth at bedtime.     No current facility-administered medications for this visit.    Allergies:   Coumadin; Warfarin; and Lisinopril    Social History:  The patient  reports that he has never smoked. He has never used smokeless tobacco. He reports that he does not drink alcohol or use illicit drugs.   Family History:  The patient's family history includes Aneurysm in his brother; CVA in his brother and sister; Heart failure in his mother; Transient ischemic attack in his daughter. There is no history of Colon  cancer or Colon polyps.    ROS:  Please see the history of present illness.   Otherwise, review of systems are positive for diarrhea, back discomfort, and hematuria which is cleared off anticoagulation therapy..   All other systems are reviewed and negative.    PHYSICAL EXAM: VS:  BP 140/70 mmHg  Pulse 78  Ht  (1.753 m)  Wt 186 lb 6.4 oz (84.55 kg)  BMI 27.51 kg/m2 , BMI Body mass index is 27.51 kg/(m^2). GEN: Well nourished,  well developed, in no acute distress HEENT: normal Neck: no JVD, carotid bruits, or masses Cardiac: IRR.  There is apical systolic murmur of mitral regurgitation. There is no edema. Respiratory:  clear to auscultation bilaterally, normal work of breathing. GI: soft, nontender, nondistended, + BS MS: no deformity or atrophy Skin: warm and dry, no rash Neuro:  Strength and sensation are intact Psych: euthymic mood, full affect   EKG:  EKG is ordered today. The ekg reveals atrial fibrillation, occasional ventricular pacing, occasional PVCs.. Interventricular conduction delay would not pacing.   Recent Labs: 11/09/2014: ALT 35 12/26/2014: BUN 22*; Creatinine, Ser 1.54*; Hemoglobin 12.2*; Platelets 182; Potassium 4.1; Sodium 136    Lipid Panel    Component Value Date/Time   CHOL 159 10/21/2014 0713   TRIG 55 10/21/2014 0713   HDL 43 10/21/2014 0713   CHOLHDL 3.7 10/21/2014 0713   VLDL 11 10/21/2014 0713   LDLCALC 105* 10/21/2014 0713      Wt Readings from Last 3 Encounters:  03/14/15 186 lb 6.4 oz (84.55 kg)  03/13/15 187 lb (84.823 kg)  02/23/15 189 lb 6.4 oz (85.911 kg)      Other studies Reviewed: Additional studies/ records that were reviewed today include: reviewed recent cardiac studies. Reviewed neurology office notes by Dr. Roda Shutters. The findings include etiology of stroke in 2016 was felt to be possibly embolic related to atrial fibrillation which is not being anticoagulated versus carotid related embolic event. He is status post right  carotid endarterectomy..    ASSESSMENT AND PLAN:  1. Carotid stenosis, bilateral Status post right carotid endarterectomy. Has 80% left carotid stenosis that will be operated upon once his bladder neck obstruction is surgically repaired.  2. Cardiac pacemaker in situ Normal function based upon today's EKG  3. Chronic systolic dysfunction of left ventricle Left ventricular systolic function has improved compared to prior studies. His September the EF is greater than or equal to 40%  4. Pre-operative clearance Cleared for upcoming and will be high risk for cardiovascular complications i.e. Greater than 5% possibility)  5. Essential hypertension Blood pressure is well controlled  6. CKD (chronic kidney disease), stage III Current status not known  7. Complete heart block (HCC) Treated with pacemaker  8. S/P carotid endarterectomy Right carotid  9. Hematuria on anticoagulation therapy due to bladder outlet obstruction, bladder mass, and previously indwelling catheter.    Current medicines are reviewed at length with the patient today.  The patient has the following concerns regarding medicines: we discussed anticoagulation therapy but this is been put on whole because of urologic bleeding..  The following changes/actions have been instituted:    Cleared for upcoming urologic surgery. High risk (greater than 5% chance of cardiac complications).Forward this note to Dr. Cassell Smiles.  Care with volume administration  Labs/ tests ordered today include:  No orders of the defined types were placed in this encounter.     Disposition:   FU with HS in 8 months  Signed, Lesleigh Noe, MD  03/14/2015 3:43 PM    [facility] [address] [place], [place], [place]  [address] Phone: [phone]; Fax: [phone]

## 2015-03-14 NOTE — Patient Instructions (Signed)
Medication Instructions:  Your physician recommends that you continue on your current medications as directed. Please refer to the Current Medication list given to you today.   Labwork: none  Testing/Procedures: none  Follow-Up: Your physician wants you to follow-up in: 6 months with Dr. Katrinka Blazing. You will receive a reminder letter in the mail two months in advance. If you don't receive a letter, please call our office to schedule the follow-up appointment.   Any Other Special Instructions Will Be Listed Below (If Applicable).  Dr. Katrinka Blazing will send a copy of his office note to Dr. Patsi Sears to clear you for surgery.  You will need to STOP taking your Aspirin 5 days before the procedure is scheduled.   If you need a refill on your cardiac medications before your next appointment, please call your pharmacy.

## 2015-03-15 ENCOUNTER — Other Ambulatory Visit: Payer: Self-pay | Admitting: Urology

## 2015-03-21 ENCOUNTER — Other Ambulatory Visit: Payer: Self-pay

## 2015-03-21 MED ORDER — VALSARTAN 320 MG PO TABS: 320.0000 mg | ORAL_TABLET | Freq: Every day | ORAL | Status: DC

## 2015-03-21 MED ORDER — FUROSEMIDE 20 MG PO TABS: 20.0000 mg | ORAL_TABLET | Freq: Every day | ORAL | Status: DC

## 2015-03-23 ENCOUNTER — Ambulatory Visit (HOSPITAL_COMMUNITY)
Admission: RE | Admit: 2015-03-23 | Discharge: 2015-03-23 | Disposition: A | Discharge status: Home / Self Care | Payer: Medicare Other | Source: Ambulatory Visit | Attending: Neurology | Admitting: Neurology

## 2015-03-23 ENCOUNTER — Other Ambulatory Visit: Payer: Self-pay | Admitting: Internal Medicine

## 2015-03-23 DIAGNOSIS — I6522 Occlusion and stenosis of left carotid artery: Secondary | ICD-10-CM | POA: Insufficient documentation

## 2015-03-23 DIAGNOSIS — Z9889 Other specified postprocedural states: Secondary | ICD-10-CM | POA: Diagnosis not present

## 2015-03-23 MED ORDER — VALSARTAN 320 MG PO TABS: 320.0000 mg | ORAL_TABLET | Freq: Every day | ORAL | Status: DC

## 2015-03-23 NOTE — Progress Notes (Signed)
Carotid Duplex Completed. Breon Diss, BS, RDMS, RVT  

## 2015-03-23 NOTE — Addendum Note (Signed)
Addended by: Awilda Bill on: 03/23/2015 01:36 PM   Modules accepted: Orders

## 2015-03-28 ENCOUNTER — Encounter (HOSPITAL_COMMUNITY): Payer: Self-pay

## 2015-03-28 ENCOUNTER — Encounter (HOSPITAL_COMMUNITY)
Admission: RE | Admit: 2015-03-28 | Discharge: 2015-03-28 | Disposition: A | Discharge status: Home / Self Care | Payer: Medicare Other | Source: Ambulatory Visit | Attending: Urology | Admitting: Urology

## 2015-03-28 DIAGNOSIS — N401 Enlarged prostate with lower urinary tract symptoms: Secondary | ICD-10-CM | POA: Insufficient documentation

## 2015-03-28 DIAGNOSIS — R338 Other retention of urine: Secondary | ICD-10-CM | POA: Diagnosis not present

## 2015-03-28 DIAGNOSIS — Z01812 Encounter for preprocedural laboratory examination: Secondary | ICD-10-CM | POA: Insufficient documentation

## 2015-03-28 DIAGNOSIS — Z8673 Personal history of transient ischemic attack (TIA), and cerebral infarction without residual deficits: Secondary | ICD-10-CM | POA: Insufficient documentation

## 2015-03-28 DIAGNOSIS — Z7901 Long term (current) use of anticoagulants: Secondary | ICD-10-CM | POA: Diagnosis not present

## 2015-03-28 LAB — CBC
HEMATOCRIT: 39.4 % (ref 39.0–52.0)
HEMOGLOBIN: 12.9 g/dL — AB (ref 13.0–17.0)
MCH: 28.5 pg (ref 26.0–34.0)
MCHC: 32.7 g/dL (ref 30.0–36.0)
MCV: 87 fL (ref 78.0–100.0)
PLATELETS: 170 10*3/uL (ref 150–400)
RBC: 4.53 MIL/uL (ref 4.22–5.81)
RDW: 15.1 % (ref 11.5–15.5)
WBC: 3.1 10*3/uL — AB (ref 4.0–10.5)

## 2015-03-28 LAB — BASIC METABOLIC PANEL
Anion gap: 9 (ref 5–15)
BUN: 19 mg/dL (ref 6–20)
CHLORIDE: 104 mmol/L (ref 101–111)
CO2: 26 mmol/L (ref 22–32)
CREATININE: 1.2 mg/dL (ref 0.61–1.24)
Calcium: 9.3 mg/dL (ref 8.9–10.3)
GFR calc non Af Amer: 52 mL/min — ABNORMAL LOW (ref >60)
Glucose, Bld: 108 mg/dL — ABNORMAL HIGH (ref 65–99)
POTASSIUM: 4.3 mmol/L (ref 3.5–5.1)
Sodium: 139 mmol/L (ref 135–145)

## 2015-03-28 NOTE — Patient Instructions (Addendum)
Oscar Santiago  03/28/2015   Your procedure is scheduled on: 04-05-15  Report to [facility] Main  Entrance take [facility]  elevators to 3rd floor to  Short Stay Center at   0830 AM.  Call this number if you have problems the morning of surgery [phone]   Remember: ONLY 1 PERSON MAY GO WITH YOU TO SHORT STAY TO GET  READY MORNING OF YOUR SURGERY.  Do not eat food or drink liquids :After Midnight.  Bring cpap mask/tubing.   Take these medicines the morning of surgery with A SIP OF WATER:  Amlodipine. Cetirizine. Finasteride. Keppra. Levothyroxine. Eye drops-use/bring. Flonase/bring. DO NOT TAKE ANY DIABETIC MEDICATIONS DAY OF YOUR SURGERY                               You may not have any metal on your body including hair pins and              piercings  Do not wear jewelry, make-up, lotions, powders or perfumes, deodorant             Do not wear nail polish.  Do not shave  48 hours prior to surgery.              Men may shave face and neck.   Do not bring valuables to the hospital. [place] IS NOT             RESPONSIBLE   FOR VALUABLES.  Contacts, dentures or bridgework may not be worn into surgery.  Leave suitcase in the car. After surgery it may be brought to your room.     Patients discharged the day of surgery will not be allowed to drive home.  Name and phone number of your driver:Oscar Santiago- grand daughter [phone]   Special Instructions: N/A              Please read over the following fact sheets you were given: _____________________________________________________________________             [facility] - Preparing for Surgery Before surgery, you can play an important role.  Because skin is not sterile, your skin needs to be as free of germs as possible.  You can reduce the number of germs on your skin by washing with CHG (chlorahexidine gluconate) soap before surgery.  CHG is an antiseptic cleaner which kills germs and bonds with  the skin to continue killing germs even after washing. Please DO NOT use if you have an allergy to CHG or antibacterial soaps.  If your skin becomes reddened/irritated stop using the CHG and inform your nurse when you arrive at Short Stay. Do not shave (including legs and underarms) for at least 48 hours prior to the first CHG shower.  You may shave your face/neck. Please follow these instructions carefully:  1.  Shower with CHG Soap the night before surgery and the  morning of Surgery.  2.  If you choose to wash your hair, wash your hair first as usual with your  normal  shampoo.  3.  After you shampoo, rinse your hair and body thoroughly to remove the  shampoo.                           4.  Use CHG as you would any  other liquid soap.  You can apply chg directly  to the skin and wash                       Gently with a scrungie or clean washcloth.  5.  Apply the CHG Soap to your body ONLY FROM THE NECK DOWN.   Do not use on face/ open                           Wound or open sores. Avoid contact with eyes, ears mouth and genitals (private parts).                       Wash face,  Genitals (private parts) with your normal soap.             6.  Wash thoroughly, paying special attention to the area where your surgery  will be performed.  7.  Thoroughly rinse your body with warm water from the neck down.  8.  DO NOT shower/wash with your normal soap after using and rinsing off  the CHG Soap.                9.  Pat yourself dry with a clean towel.            10.  Wear clean pajamas.            11.  Place clean sheets on your bed the night of your first shower and do not  sleep with pets. Day of Surgery : Do not apply any lotions/deodorants the morning of surgery.  Please wear clean clothes to the hospital/surgery center.  FAILURE TO FOLLOW THESE INSTRUCTIONS MAY RESULT IN THE CANCELLATION OF YOUR SURGERY PATIENT SIGNATURE_________________________________  NURSE  SIGNATURE__________________________________  ________________________________________________________________________

## 2015-03-28 NOTE — Pre-Procedure Instructions (Addendum)
EKG 03-14-15,Echo 9'16,CXR 10'16 Epic. Dr. Rexene Edison. Smith,cardiology note in Epic 03-14-15.

## 2015-03-29 ENCOUNTER — Other Ambulatory Visit: Payer: Self-pay | Admitting: Internal Medicine

## 2015-04-04 ENCOUNTER — Telehealth: Payer: Self-pay

## 2015-04-04 NOTE — Telephone Encounter (Signed)
Pt's granddaughter returned Katrina's call

## 2015-04-04 NOTE — Telephone Encounter (Signed)
LFt vm for [name] pts DPR granddaughter to call back about results.

## 2015-04-04 NOTE — Telephone Encounter (Signed)
-----   Message from Marvel Plan, MD sent at 04/02/2015  6:22 PM EST ----- Could you please let the patient know that the carotid doppler test done recently showed no change of left carotid stenosis and no narrowing at right carotid artery after the endarterectomy. Please continue current treatment. Thanks.  Marvel Plan, MD PhD Stroke Neurology 04/02/2015 6:22 PM

## 2015-04-04 NOTE — Telephone Encounter (Signed)
Rn talk to Oscar Santiago his granddaughter about her grandfathers carotid dopplerTHe carotid doppler test done recently showed no change of left carotid stenosis and no narrowing at right carotid artery after the endarterectomy. Please continue current treatment. Pts granddaughter verbalized understanding.

## 2015-04-05 ENCOUNTER — Inpatient Hospital Stay (HOSPITAL_COMMUNITY): Payer: Medicare Other | Admitting: Anesthesiology

## 2015-04-05 ENCOUNTER — Encounter (HOSPITAL_COMMUNITY)
Admission: RE | Disposition: A | Discharge status: Home Health | Payer: Self-pay | Source: Ambulatory Visit | Attending: Urology

## 2015-04-05 ENCOUNTER — Inpatient Hospital Stay (HOSPITAL_COMMUNITY)
Admission: RE | Admit: 2015-04-05 | Discharge: 2015-04-07 | DRG: 714 | Disposition: A | Discharge status: Home Health | Payer: Medicare Other | Source: Ambulatory Visit | Attending: Urology | Admitting: Urology

## 2015-04-05 ENCOUNTER — Encounter (HOSPITAL_COMMUNITY): Payer: Self-pay | Admitting: Anesthesiology

## 2015-04-05 DIAGNOSIS — R339 Retention of urine, unspecified: Secondary | ICD-10-CM | POA: Diagnosis present

## 2015-04-05 DIAGNOSIS — N400 Enlarged prostate without lower urinary tract symptoms: Secondary | ICD-10-CM | POA: Diagnosis present

## 2015-04-05 DIAGNOSIS — N200 Calculus of kidney: Secondary | ICD-10-CM | POA: Diagnosis present

## 2015-04-05 DIAGNOSIS — N401 Enlarged prostate with lower urinary tract symptoms: Principal | ICD-10-CM | POA: Diagnosis present

## 2015-04-05 DIAGNOSIS — G4730 Sleep apnea, unspecified: Secondary | ICD-10-CM | POA: Diagnosis present

## 2015-04-05 DIAGNOSIS — G40909 Epilepsy, unspecified, not intractable, without status epilepticus: Secondary | ICD-10-CM | POA: Diagnosis present

## 2015-04-05 DIAGNOSIS — E039 Hypothyroidism, unspecified: Secondary | ICD-10-CM | POA: Diagnosis present

## 2015-04-05 DIAGNOSIS — I10 Essential (primary) hypertension: Secondary | ICD-10-CM | POA: Diagnosis present

## 2015-04-05 DIAGNOSIS — Z79899 Other long term (current) drug therapy: Secondary | ICD-10-CM | POA: Diagnosis not present

## 2015-04-05 DIAGNOSIS — Z8673 Personal history of transient ischemic attack (TIA), and cerebral infarction without residual deficits: Secondary | ICD-10-CM | POA: Diagnosis not present

## 2015-04-05 DIAGNOSIS — Z8249 Family history of ischemic heart disease and other diseases of the circulatory system: Secondary | ICD-10-CM

## 2015-04-05 DIAGNOSIS — Z950 Presence of cardiac pacemaker: Secondary | ICD-10-CM

## 2015-04-05 DIAGNOSIS — R310 Gross hematuria: Secondary | ICD-10-CM | POA: Diagnosis present

## 2015-04-05 DIAGNOSIS — I4891 Unspecified atrial fibrillation: Secondary | ICD-10-CM | POA: Diagnosis present

## 2015-04-05 DIAGNOSIS — Z01812 Encounter for preprocedural laboratory examination: Secondary | ICD-10-CM | POA: Diagnosis not present

## 2015-04-05 HISTORY — PX: TRANSURETHRAL RESECTION OF PROSTATE: SHX73

## 2015-04-05 HISTORY — PX: INSERTION OF SUPRAPUBIC CATHETER: SHX5870

## 2015-04-05 SURGERY — TURP (TRANSURETHRAL RESECTION OF PROSTATE)
Anesthesia: General

## 2015-04-05 MED ORDER — ONDANSETRON HCL 4 MG/2ML IJ SOLN
INTRAMUSCULAR | Status: DC | PRN
  Administered 2015-04-05: 4 mg via INTRAVENOUS

## 2015-04-05 MED ORDER — SODIUM CHLORIDE 0.45 % IV SOLN
INTRAVENOUS | Status: DC
  Administered 2015-04-05 – 2015-04-07 (×3): via INTRAVENOUS

## 2015-04-05 MED ORDER — LATANOPROST 0.005 % OP SOLN
1.0000 [drp] | Freq: Every day | OPHTHALMIC | Status: DC
  Administered 2015-04-05 – 2015-04-06 (×2): 1 [drp] via OPHTHALMIC
  Filled 2015-04-05: qty 2.5

## 2015-04-05 MED ORDER — CEFAZOLIN SODIUM-DEXTROSE 2-3 GM-% IV SOLR
2.0000 g | INTRAVENOUS | Status: AC
  Administered 2015-04-05: 2 g via INTRAVENOUS

## 2015-04-05 MED ORDER — IRBESARTAN 75 MG PO TABS
37.5000 mg | ORAL_TABLET | Freq: Every day | ORAL | Status: DC
  Administered 2015-04-05: 37.5 mg via ORAL
  Filled 2015-04-05 (×3): qty 0.5

## 2015-04-05 MED ORDER — LEVOTHYROXINE SODIUM 25 MCG PO TABS
25.0000 ug | ORAL_TABLET | Freq: Every day | ORAL | Status: DC
  Administered 2015-04-06 – 2015-04-07 (×2): 25 ug via ORAL
  Filled 2015-04-05 (×2): qty 1

## 2015-04-05 MED ORDER — PROPOFOL 10 MG/ML IV BOLUS
INTRAVENOUS | Status: DC | PRN
  Administered 2015-04-05: 100 mg via INTRAVENOUS

## 2015-04-05 MED ORDER — ONDANSETRON HCL 4 MG/2ML IJ SOLN: 4.0000 mg | Freq: Once | INTRAMUSCULAR | Status: DC | PRN

## 2015-04-05 MED ORDER — OXYCODONE-ACETAMINOPHEN 5-325 MG PO TABS: 1.0000 | ORAL_TABLET | ORAL | Status: DC | PRN

## 2015-04-05 MED ORDER — FINASTERIDE 5 MG PO TABS
5.0000 mg | ORAL_TABLET | Freq: Every day | ORAL | Status: DC
  Administered 2015-04-06 – 2015-04-07 (×2): 5 mg via ORAL
  Filled 2015-04-05 (×2): qty 1

## 2015-04-05 MED ORDER — STERILE WATER FOR IRRIGATION IR SOLN
Status: DC | PRN
  Administered 2015-04-05: 500 mL

## 2015-04-05 MED ORDER — HYDROMORPHONE HCL 1 MG/ML IJ SOLN
0.2500 mg | INTRAMUSCULAR | Status: DC | PRN
  Administered 2015-04-05 (×3): 0.5 mg via INTRAVENOUS

## 2015-04-05 MED ORDER — FENTANYL CITRATE (PF) 100 MCG/2ML IJ SOLN
INTRAMUSCULAR | Status: AC
  Filled 2015-04-05: qty 2

## 2015-04-05 MED ORDER — LEVETIRACETAM 500 MG PO TABS
500.0000 mg | ORAL_TABLET | Freq: Two times a day (BID) | ORAL | Status: DC
  Administered 2015-04-05 – 2015-04-07 (×4): 500 mg via ORAL
  Filled 2015-04-05 (×4): qty 1

## 2015-04-05 MED ORDER — COLCHICINE 0.6 MG PO TABS: 0.6000 mg | ORAL_TABLET | Freq: Two times a day (BID) | ORAL | Status: DC | PRN

## 2015-04-05 MED ORDER — CEFAZOLIN SODIUM-DEXTROSE 2-3 GM-% IV SOLR
INTRAVENOUS | Status: AC
  Filled 2015-04-05: qty 50

## 2015-04-05 MED ORDER — MEPERIDINE HCL 50 MG/ML IJ SOLN: 6.2500 mg | INTRAMUSCULAR | Status: DC | PRN

## 2015-04-05 MED ORDER — LORATADINE 10 MG PO TABS
10.0000 mg | ORAL_TABLET | Freq: Every day | ORAL | Status: DC
  Administered 2015-04-06 – 2015-04-07 (×2): 10 mg via ORAL
  Filled 2015-04-05 (×2): qty 1

## 2015-04-05 MED ORDER — OXYBUTYNIN CHLORIDE 5 MG PO TABS
5.0000 mg | ORAL_TABLET | Freq: Three times a day (TID) | ORAL | Status: DC | PRN
  Administered 2015-04-06: 5 mg via ORAL
  Filled 2015-04-05: qty 1

## 2015-04-05 MED ORDER — HYDROMORPHONE HCL 1 MG/ML IJ SOLN
INTRAMUSCULAR | Status: AC
Start: 2015-04-05 — End: 2015-04-06
  Filled 2015-04-05: qty 1

## 2015-04-05 MED ORDER — FUROSEMIDE 20 MG PO TABS
20.0000 mg | ORAL_TABLET | Freq: Every day | ORAL | Status: DC
  Administered 2015-04-06 – 2015-04-07 (×2): 20 mg via ORAL
  Filled 2015-04-05 (×2): qty 1

## 2015-04-05 MED ORDER — FENTANYL CITRATE (PF) 100 MCG/2ML IJ SOLN
INTRAMUSCULAR | Status: DC | PRN
  Administered 2015-04-05 (×4): 25 ug via INTRAVENOUS

## 2015-04-05 MED ORDER — SENNOSIDES-DOCUSATE SODIUM 8.6-50 MG PO TABS
2.0000 | ORAL_TABLET | Freq: Every day | ORAL | Status: DC
  Administered 2015-04-05 – 2015-04-06 (×2): 2 via ORAL
  Filled 2015-04-05 (×2): qty 2

## 2015-04-05 MED ORDER — CETYLPYRIDINIUM CHLORIDE 0.05 % MT LIQD
7.0000 mL | Freq: Two times a day (BID) | OROMUCOSAL | Status: DC
  Administered 2015-04-05 – 2015-04-07 (×4): 7 mL via OROMUCOSAL

## 2015-04-05 MED ORDER — HYDROMORPHONE HCL 1 MG/ML IJ SOLN
0.5000 mg | INTRAMUSCULAR | Status: DC | PRN
  Administered 2015-04-06: 1 mg via INTRAVENOUS
  Filled 2015-04-05: qty 1

## 2015-04-05 MED ORDER — SODIUM CHLORIDE 0.9 % IR SOLN
Status: DC | PRN
  Administered 2015-04-05: 37000 mL via INTRAVESICAL
  Administered 2015-04-05: 14:00:00

## 2015-04-05 MED ORDER — SODIUM CHLORIDE 0.9 % IR SOLN
Status: DC | PRN
  Administered 2015-04-05: 9000 mL via INTRAVESICAL
  Administered 2015-04-05: 14:00:00

## 2015-04-05 MED ORDER — LACTATED RINGERS IV SOLN
INTRAVENOUS | Status: DC | PRN
  Administered 2015-04-05: 11:00:00 via INTRAVENOUS

## 2015-04-05 MED ORDER — SODIUM CHLORIDE 0.9 % IR SOLN
1000.0000 mL | Status: DC
  Administered 2015-04-05 (×5): 1000 mL

## 2015-04-05 MED ORDER — ALENDRONATE SODIUM 70 MG PO TABS: 70.0000 mg | ORAL_TABLET | ORAL | Status: DC

## 2015-04-05 MED ORDER — ONDANSETRON HCL 4 MG/2ML IJ SOLN
4.0000 mg | INTRAMUSCULAR | Status: DC | PRN
  Administered 2015-04-05 – 2015-04-06 (×2): 4 mg via INTRAVENOUS
  Filled 2015-04-05 (×2): qty 2

## 2015-04-05 MED ORDER — ACETAMINOPHEN 325 MG PO TABS: 650.0000 mg | ORAL_TABLET | ORAL | Status: DC | PRN

## 2015-04-05 MED ORDER — BACITRACIN-NEOMYCIN-POLYMYXIN 400-5-5000 EX OINT: 1.0000 "application " | TOPICAL_OINTMENT | Freq: Three times a day (TID) | CUTANEOUS | Status: DC | PRN

## 2015-04-05 MED ORDER — LACTATED RINGERS IV SOLN: INTRAVENOUS | Status: DC

## 2015-04-05 MED ORDER — ZOLPIDEM TARTRATE 5 MG PO TABS
5.0000 mg | ORAL_TABLET | Freq: Every day | ORAL | Status: DC
  Administered 2015-04-05: 5 mg via ORAL
  Filled 2015-04-05 (×2): qty 1

## 2015-04-05 MED ORDER — AMLODIPINE BESYLATE 10 MG PO TABS
10.0000 mg | ORAL_TABLET | Freq: Every day | ORAL | Status: DC
  Administered 2015-04-05 – 2015-04-06 (×2): 10 mg via ORAL
  Filled 2015-04-05 (×2): qty 1

## 2015-04-05 MED ORDER — FAMOTIDINE 10 MG PO TABS
10.0000 mg | ORAL_TABLET | Freq: Every day | ORAL | Status: DC | PRN
  Filled 2015-04-05 (×2): qty 1

## 2015-04-05 MED ORDER — SULFAMETHOXAZOLE-TRIMETHOPRIM 800-160 MG PO TABS
1.0000 | ORAL_TABLET | Freq: Two times a day (BID) | ORAL | Status: DC
  Administered 2015-04-05 – 2015-04-07 (×4): 1 via ORAL
  Filled 2015-04-05 (×4): qty 1

## 2015-04-05 MED ORDER — 0.9 % SODIUM CHLORIDE (POUR BTL) OPTIME
TOPICAL | Status: DC | PRN
  Administered 2015-04-05: 1000 mL

## 2015-04-05 MED ORDER — PROPOFOL 10 MG/ML IV BOLUS
INTRAVENOUS | Status: AC
  Filled 2015-04-05: qty 20

## 2015-04-05 MED ORDER — ONDANSETRON HCL 4 MG/2ML IJ SOLN
INTRAMUSCULAR | Status: AC
  Filled 2015-04-05: qty 2

## 2015-04-05 SURGICAL SUPPLY — 37 items
BAG URINE DRAINAGE (UROLOGICAL SUPPLIES) ×3 IMPLANT
BAG URINE LEG 500ML (DRAIN) ×3 IMPLANT
BAG URO CATCHER STRL LF (MISCELLANEOUS) ×3 IMPLANT
BLADE SURG 15 STRL LF DISP TIS (BLADE) ×1 IMPLANT
BLADE SURG 15 STRL SS (BLADE) ×3
CATH FOLEY 2WAY  5CC 16FR SIL (CATHETERS) ×2
CATH FOLEY 2WAY 5CC 16FR SIL (CATHETERS) IMPLANT
CATH HEMA 3WAY 30CC 22FR COUDE (CATHETERS) ×3 IMPLANT
CLOTH BEACON ORANGE TIMEOUT ST (SAFETY) ×3 IMPLANT
COUNTER NEEDLE 20 DBL MAG RED (NEEDLE) ×3 IMPLANT
ELECT PENCIL ROCKER SW 15FT (MISCELLANEOUS) IMPLANT
ELECT REM PT RETURN 9FT ADLT (ELECTROSURGICAL) ×3
ELECTRODE REM PT RTRN 9FT ADLT (ELECTROSURGICAL) ×1 IMPLANT
GAUZE SPONGE 4X4 16PLY XRAY LF (GAUZE/BANDAGES/DRESSINGS) ×3 IMPLANT
GLOVE BIO SURGEON STRL SZ7.5 (GLOVE) ×3 IMPLANT
GLOVE BIOGEL M STRL SZ7.5 (GLOVE) ×3 IMPLANT
GOWN STRL REUS W/TWL LRG LVL3 (GOWN DISPOSABLE) ×3 IMPLANT
GOWN STRL REUS W/TWL XL LVL3 (GOWN DISPOSABLE) ×6 IMPLANT
HOLDER FOLEY CATH W/STRAP (MISCELLANEOUS) IMPLANT
KIT SUPRAPUBIC CATH (MISCELLANEOUS) ×3 IMPLANT
LOOP CUT BIPOLAR 24F LRG (ELECTROSURGICAL) IMPLANT
MANIFOLD NEPTUNE II (INSTRUMENTS) ×3 IMPLANT
NDL SPNL 18GX3.5 QUINCKE PK (NEEDLE) IMPLANT
NEEDLE HYPO 22GX1.5 SAFETY (NEEDLE) IMPLANT
NEEDLE SPNL 18GX3.5 QUINCKE PK (NEEDLE) ×3 IMPLANT
NS IRRIG 1000ML POUR BTL (IV SOLUTION) ×3 IMPLANT
PACK CYSTO (CUSTOM PROCEDURE TRAY) ×3 IMPLANT
PLUG CATH AND CAP STER (CATHETERS) IMPLANT
SUT ETHILON 3 0 PS 1 (SUTURE) ×3 IMPLANT
SYR 20CC LL (SYRINGE) ×3 IMPLANT
SYR 30ML LL (SYRINGE) IMPLANT
SYRINGE 10CC LL (SYRINGE) ×3 IMPLANT
SYRINGE IRR TOOMEY STRL 70CC (SYRINGE) ×3 IMPLANT
TOWEL OR 17X26 10 PK STRL BLUE (TOWEL DISPOSABLE) ×3 IMPLANT
TUBING CONNECTING 10 (TUBING) ×4 IMPLANT
TUBING CONNECTING 10' (TUBING) ×2
WATER STERILE IRR 3000ML UROMA (IV SOLUTION) ×3 IMPLANT

## 2015-04-05 NOTE — Interval H&P Note (Signed)
History and Physical Interval Note:  04/05/2015 10:45 AM  Oscar Santiago  has presented today for surgery, with the diagnosis of BENIGN LOCALIZED HYPERPLASIA  The various methods of treatment have been discussed with the patient and family. After consideration of risks, benefits and other options for treatment, the patient has consented to  Procedure(s): TRANSURETHRAL RESECTION OF THE PROSTATE (TURP) (N/A) INSERTION OF SUPRAPUBIC CATHETER (N/A) as a surgical intervention .  The patient's history has been reviewed, patient examined, no change in status, stable for surgery.  I have reviewed the patient's chart and labs.  Questions were answered to the patient's satisfaction.     [name] I Gilles Trimpe

## 2015-04-05 NOTE — Anesthesia Procedure Notes (Signed)
Procedure Name: LMA Insertion Date/Time: 04/05/2015 10:54 AM Performed by: Paris Lore Pre-anesthesia Checklist: Patient identified, Emergency Drugs available, Suction available, Patient being monitored and Timeout performed Patient Re-evaluated:Patient Re-evaluated prior to inductionOxygen Delivery Method: Circle system utilized Preoxygenation: Pre-oxygenation with 100% oxygen Intubation Type: IV induction Ventilation: Mask ventilation without difficulty LMA: LMA inserted LMA Size: 4.0 Number of attempts: 1 Placement Confirmation: positive ETCO2 and breath sounds checked- equal and bilateral Tube secured with: Tape

## 2015-04-05 NOTE — Anesthesia Postprocedure Evaluation (Signed)
Anesthesia Post Note  Patient: Oscar Santiago  Procedure(s) Performed: Procedure(s) (LRB): TRANSURETHRAL RESECTION OF THE PROSTATE (TURP) (N/A) INSERTION OF SUPRAPUBIC CATHETER (N/A)  Patient location during evaluation: PACU Anesthesia Type: General Level of consciousness: awake and alert Pain management: pain level controlled Vital Signs Assessment: post-procedure vital signs reviewed and stable Respiratory status: spontaneous breathing, nonlabored ventilation and respiratory function stable Cardiovascular status: blood pressure returned to baseline and stable Postop Assessment: no signs of nausea or vomiting Anesthetic complications: no    Last Vitals:  Filed Vitals:   04/05/15 1600 04/05/15 1619  BP: 138/75 151/79  Pulse: 60 78  Temp: 36.2 C 36.4 C  Resp: 9 12    Last Pain:  Filed Vitals:   04/05/15 1620  PainSc: Asleep                 Jada Kuhnert A

## 2015-04-05 NOTE — Transfer of Care (Signed)
Immediate Anesthesia Transfer of Care Note  Patient: Oscar Santiago  Procedure(s) Performed: Procedure(s): TRANSURETHRAL RESECTION OF THE PROSTATE (TURP) (N/A) INSERTION OF SUPRAPUBIC CATHETER (N/A)  Patient Location: PACU  Anesthesia Type:General  Level of Consciousness:  sedated, patient cooperative and responds to stimulation  Airway & Oxygen Therapy:Patient Spontanous Breathing and Patient connected to face mask oxgen  Post-op Assessment:  Report given to PACU RN and Post -op Vital signs reviewed and stable  Post vital signs:  Reviewed and stable  Last Vitals:  Filed Vitals:   04/05/15 0856  BP: 129/88  Pulse: 109  Temp: 36.2 C  Resp: 18    Complications: No apparent anesthesia complications

## 2015-04-05 NOTE — H&P (Signed)
Reason For Visit 1 mo f/u & PVR   Active Problems Problems  1. Benign localized hyperplasia of prostate with urinary retention (N40.1)   Assessed By: Jethro Bolus (Urology); Last Assessed: 20 Feb 2015 2. Bilateral kidney stones (N20.0)  History of Present Illness     82 yo male returns today for a 1 mo f/u & PVR. Hx of: 1 - Enlarged prostate with recurrent urinary retention - long h/o BPH s/p TURP 1999 by [facility]. PSA <4 when stopped screening 2008. New urinary retention following hospitalization for CVA / Rt carotid endarterectomy 10/2014, failed trial of void x several on tamsulosin + finasteride. No recent urodynamics. TRUS 11/2014 65gm and cysto x several within past 2 years confirms recurrent trilobar prostatic hypertrophy.    2 - Gross hematuria - new gross hematuria after starting antiplatelet meds 2016. He has bilateral nephrolithiasis and large friable prostate. CT 2016 w/o upper tract neoplastm, cysto 2016 confirms no primary urothelial lesions.    3 - Bilateral nephrolithiasis - Rt 5mm lower pole, Lt lower pole 18m and 7mm stones by CT 02/2014. Stable since before 2010. No colic episodes.     PMH sig for failiure to thrive / chronic diarrea, CVA, Rt Carotid surgery (currently has Lt 80% blockage per report and possibly mat get left CEA in future), Rt inguinal hernia repair, CAD/CATH (follows [facility] Prime, on ASA 81 only), Arrythmia / Pacemaker (follows [facility]). His PCP is Evelena Peat MD. 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 At baseline he is ambulatory, but only short distances. Cannot walk around block / flight of stairs from fatigue. He lives with his granddaughter.     Videourodynamics was accomplished on 01/11/15. He had a max capacity of about 190 mls. There was positive low amplitude of instability with multiple unstable contractions early during the filling phase. He could feel the contractions come and go. No leakage was  noted. He was able to void voluntarily. His detrusor pressures were not high, with the strongest being a post void contraction of 30 cmH2O. He emptied completely. Trabeculation and mild elevation of bladder base was noted. No reflux was seen.   Past Medical History Problems  1. History of Arthritis 2. History of Atrial fibrillation (I48.91) 3. History of Gout (M10.9) 4. History of cardiac disorder (Z86.79) 5. History of depression (Z86.59) 6. History of esophageal reflux (Z87.19) 7. History of glaucoma (Z86.69) 8. History of hepatitis (Z86.19) 9. History of hypertension (Z86.79) 10. History of hypothyroidism (Z86.39) 11. History of renal failure (Z87.448) 12. History of Seizure  Surgical History Problems  1. History of Hemorrhoidectomy 2. History of Pacemaker Placement 3. History of Prostate Surgery  Current Meds 1. Alendronate Sodium 70 MG Oral Tablet;  Therapy: 09Nov2015 to Recorded 2. AmLODIPine Besylate 2.5 MG Oral Tablet;  Therapy: 31Oct2015 to Recorded 3. Aspirin 81 MG TABS;  Therapy: (Recorded:11Feb2016) to Recorded 4. Black Cherry Concentrate LIQD;  Therapy: (Recorded:12Oct2016) to Recorded 5. Centrum Silver TABS;  Therapy: (Recorded:17Nov2008) to Recorded 6. Colchicine 0.6 MG Oral Tablet;  Therapy: 03Oct2016 to Recorded 7. Dorzolamide HCl-Timolol Mal 22.3-6.8 MG/ML Ophthalmic Solution;  Therapy: (Recorded:11Feb2016) to Recorded 8. Famotidine 10 MG Oral Tablet;  Therapy: (Recorded:12Oct2016) to Recorded 9. Flonase 50 MCG/ACT SUSP;  Therapy: (Recorded:12Oct2016) to Recorded 10. Furosemide 20 MG Oral Tablet;   Therapy: (Recorded:12Oct2016) to Recorded 11. Furosemide 20 MG Oral Tablet;   Therapy: 03Oct2016 to Recorded 12. Latanoprost 0.005 % Ophthalmic Solution;   Therapy: (Recorded:11Feb2016) to Recorded 13. LevETIRAcetam  500 MG Oral Tablet;   Therapy: 01Sep2015 to Recorded 14. Levothyroxine Sodium 25 MCG Oral Tablet;   Therapy: 29Aug2015 to Recorded 15.  Nitrofurantoin Monohyd Macro 100 MG Oral Capsule; Take 1 capsule twice daily;   Therapy: 13Dec2016 to (Last Rx:13Dec2016)  Requested for: 13Dec2016 Ordered 16. Pravachol 20 MG Oral Tablet;   Therapy: (Recorded:12Oct2016) to Recorded 17. Tamsulosin HCl - 0.4 MG Oral Capsule; TAKE 1 CAPSULE (0.4 MG TOTAL) BY MOUTH   DAILY AFTER BREAKFAST;   Therapy: 09Jan2017 to (Evaluate:08Jul2017)  Requested for: 09Jan2017; Last   Rx:09Jan2017 Ordered 18. Tylenol TABS;   Therapy: (Recorded:12Oct2016) to Recorded 19. Valsartan-Hydrochlorothiazide 320-25 MG Oral Tablet;   Therapy: 28Nov2015 to Recorded 20. Vitamin B-12 TABS;   Therapy: (Recorded:11Feb2016) to Recorded 21. Zolpidem Tartrate 5 MG Oral Tablet;   Therapy: 04Feb2016 to Recorded 22. Zyrtec 10 MG TABS;   Therapy: (Recorded:12Oct2016) to Recorded  Allergies Medication  1. Coumadin TABS  Family History Problems  1. Family history of Death In The Family Father : Father   UnknownAge of death was 88 2. Family history of Death In The Family Mother : Mother   Enlarged heartAge of death was 59 3. Family history of Family Health Status Number Of Children   Three sons and three daughters 4. Family history of asthma (Z82.5) : Sister 5. Family history of Heart Disease : Mother 6. Family history of Reported Previous Cardiac Problems : Father 7. Family history of Stroke Syndrome : Father  Social History Problems  1. Denied: Alcohol Use (History) 2. Caffeine Use   2-3 per day 3. Father deceased 73. Marital History - Widowed 5. Mother deceased 21. Never a smoker 7. Number of children   3 sons/ 3 daughters 8. Occupation:   Pastor 9. Retired 10. Denied: Tobacco Use  Review of Systems Genitourinary, constitutional, skin, eye, otolaryngeal, hematologic/lymphatic, cardiovascular, pulmonary, endocrine, musculoskeletal, gastrointestinal, neurological and psychiatric system(s) were reviewed and pertinent findings if present are noted and are  otherwise negative.    Vitals Vital Signs [Data Includes: Last 1 Day]  Recorded: 17Jan2017 03:01PM  Blood Pressure: 186 / 81 Temperature: 97.6 F Heart Rate: 86  Physical Exam Constitutional: well developed . No acute distress. The patient appears well hydrated. Thin, poorly nourished male in NAD.  ENT:. The ears and nose are normal in appearance. Examination of the teeth show poor dentition. Hearing loss is noted.  Neck: The appearance of the neck is normal.  Pulmonary: No respiratory distress.  Cardiovascular:. No peripheral edema.  Abdomen: The abdomen is flat. The abdomen is soft and nontender. No masses are palpated. No CVA tenderness. No hernias are palpable. No hepatosplenomegaly noted.  Rectal: Rectal exam demonstrates decreased sphincter tone. Estimated prostate size is 4+.  Genitourinary: Examination of the penis demonstrates no discharge, no masses, no lesions and a normal meatus. The penis is uncircumcised. The scrotum is without lesions. The right epididymis is palpably normal and non-tender. The left epididymis is palpably normal and non-tender. The right testis is palpably normal, non-tender and without masses. The left testis is normal, non-tender and without masses.  Lymphatics: The femoral and inguinal nodes are not enlarged or tender.  Skin: Normal skin turgor, no visible rash and no visible skin lesions.  Neuro/Psych:. Mood and affect are appropriate.    Results/Data Urine [Data Includes: Last 1 Day]   17Jan2017  COLOR YELLOW   APPEARANCE CLOUDY   SPECIFIC GRAVITY 1.010   pH 6.0   GLUCOSE NEGATIVE   BILIRUBIN NEGATIVE   KETONE NEGATIVE  BLOOD 2+   PROTEIN NEGATIVE   NITRITE NEGATIVE   LEUKOCYTE ESTERASE 1+   SQUAMOUS EPITHELIAL/HPF NONE SEEN HPF  WBC 10-20 WBC/HPF  RBC 3-10 RBC/HPF  BACTERIA FEW HPF  CRYSTALS NONE SEEN HPF  CASTS NONE SEEN LPF  Yeast NONE SEEN HPF   PVR: Ultrasound PVR 191 ml.    Assessment Assessed  1. Benign localized hyperplasia  of prostate with urinary retention (N40.1)  Recurrent BPH, with elevated pvr, and hx of incomplete resection of tissue, and hx of A.Fib ( hx of GI bleed with Coumadin). Currently post CVA in September. He is taking Elaquis, and would need to stop medication pre TURP. Needs pre-op medical clearance. Needs 6 months from CVA, prior to consideration of his carotid surgery.   Plan Benign localized hyperplasia of prostate with urinary retention  1. Follow-up Office  Follow-up: RTC for re-ck pvr by u/s double void post void residuals.   Status: Hold For - Appointment,Date of Service  Requested for: 17Jan2017 Benign localized hyperplasia of prostate with urinary retention, Enlarged prostate with urinary retention  2. Follow-up Schedule Surgery Office  Follow-up  Status: Hold For - Appointment   Requested for: 17Jan2017  Plan for TURP and s-p tube in FEB/March   Discussion/Summary cc: Dr. Tawanna Cooler Early   c: Dr. Demetrius Charity. Caryl Never     Signatures Electronically signed by : Jethro Bolus, M.D.; Feb 20 2015  3:40PM EST

## 2015-04-05 NOTE — Anesthesia Preprocedure Evaluation (Addendum)
Anesthesia Evaluation  Patient identified by MRN, date of birth, ID band Patient awake    Reviewed: Allergy & Precautions, NPO status , Patient's Chart, lab work & pertinent test results  Airway Mallampati: I  TM Distance: >3 FB Neck ROM: Full    Dental   Pulmonary sleep apnea ,    Pulmonary exam normal        Cardiovascular hypertension, Pt. on medications Normal cardiovascular exam+ pacemaker   ECHO 9/16: Study Conclusions  - Left ventricle: Hypokinesis of the anterior wall and the anterior septum. The cavity size was normal. Wall thickness was increased in a pattern of mild LVH. The estimated ejection fraction was 40%. - Mitral valve: There was moderate regurgitation. - Left atrium: The atrium was severely dilated. - Right ventricle: The cavity size was mildly dilated. Systolic function was mildly reduced. - Right atrium: The atrium was mildly dilated. - Tricuspid valve: There was moderate regurgitation. - Pulmonary arteries: PA peak pressure: 35 mm Hg (S). - Impressions: No cardiac source of embolism was identified, but cannot be ruled out on the basis of this examination.  Impressions:  - No cardiac source of embolism was identified, but cannot be ruled out on the basis of this examination.    Neuro/Psych Depression CVA    GI/Hepatic   Endo/Other  Hypothyroidism   Renal/GU Renal InsufficiencyRenal disease     Musculoskeletal   Abdominal   Peds  Hematology   Anesthesia Other Findings   Reproductive/Obstetrics                            Anesthesia Physical Anesthesia Plan  ASA: III  Anesthesia Plan: General   Post-op Pain Management:    Induction: Intravenous  Airway Management Planned: LMA  Additional Equipment:   Intra-op Plan:   Post-operative Plan: Extubation in OR  Informed Consent: I have reviewed the patients History and Physical, chart, labs  and discussed the procedure including the risks, benefits and alternatives for the proposed anesthesia with the patient or authorized representative who has indicated his/her understanding and acceptance.     Plan Discussed with: CRNA and Surgeon  Anesthesia Plan Comments:         Anesthesia Quick Evaluation

## 2015-04-05 NOTE — Op Note (Signed)
Pre-operative diagnosis :   BPH  Postoperative diagnosis:  Same  Operation:  TURP of Trilobar BPH with large median lobe, with placement of 22F suprapubic catheter.  Surgeon:  Kathie Rhodes. Patsi Sears, MD  First assistant:  none  Anesthesia:  General LMA  Preparation:  After appropriate preanesthesia, the patient was brought to the operative room, placed on the operating table in the dorsal supine position where general LMA anesthesia was induced. He was replaced in the dorsal lithotomy position with the pubis was prepped with Betadine solution and draped in usual fashion. The arm band was double checked. The history was double checked.  Review history:  Benign localized hyperplasia of prostate with urinary retention (N40.1)  Assessed By: Jethro Bolus (Urology); Last Assessed: 20 Feb 2015 2. Bilateral kidney stones (N20.0)  History of Present Illness    82 yo male returns today for a 1 mo f/u & PVR. Hx of: 1 - Enlarged prostate with recurrent urinary retention - long h/o BPH s/p TURP 1999 by [facility]. PSA <4 when stopped screening 2008. New urinary retention following hospitalization for CVA / Rt carotid endarterectomy 10/2014, failed trial of void x several on tamsulosin + finasteride. No recent urodynamics. TRUS 11/2014 65gm and cysto x several within past 2 years confirms recurrent trilobar prostatic hypertrophy.    2 - Gross hematuria - new gross hematuria after starting antiplatelet meds 2016. He has bilateral nephrolithiasis and large friable prostate. CT 2016 w/o upper tract neoplastm, cysto 2016 confirms no primary urothelial lesions.    3 - Bilateral nephrolithiasis - Rt 5mm lower pole, Lt lower pole 88m and 7mm stones by CT 02/2014. Stable since before 2010. No colic episodes.     Statement of  Likelihood of Success: Excellent. TIME-OUT observed.:  Procedure:  Cystourethroscopy with accomplished as, showing trilobar BPH. A very large median lobe was also identified, extending  from the right lateral prostate into the bladder. This was ball-shaped, and served as a ball-valve occluding the prostate. The patient was placed in slight Trendelenburg position, and needle was used to identify the bladder. Using a Lowsley tractor, cutdown was accomplished on the Lowsley, and it was then placed in the wound. A 16 French Foley catheter was then brought through the bladder and through the urethra. The Lowsley was removed, and under direct vision, the catheter was brought of the bladder, and 10 mL replacement balloon. Serous and once urine was obtained from the catheter. Cath was plugged during the rest of the procedure.  Resection was begun at the seven o'clock position, and carried to the 5:00 position, then from the 11:00 to the 7:00 this and, then included the median lobe in its entirety. Resection was accomplished on the left side, from the 1:00 to the 5:00 position. All chips were evacuated and sent to the laboratory for examination. A size 22 [place] hematuria catheter with 30 mL in balloon was placed in the bladder to traction and continuous irrigation.  The patient was awakened and taken to recovery room in good condition.

## 2015-04-05 NOTE — Progress Notes (Signed)
Received pt from PACU, alert and oriented, denies pain at this time, F/C with CBI, SCDs and telemetry intact, oriented to unit, call light placed in reach

## 2015-04-05 NOTE — Progress Notes (Signed)
Pt refused CPAP qhs.  Pt's daughter brought his home CPAP but he stated he would not wear it tonight.  Daughter states she is staying with the Pt all night and would contact RT should he change his mind.  RT will continue to monitor as needed.

## 2015-04-06 ENCOUNTER — Other Ambulatory Visit: Payer: Self-pay | Admitting: Internal Medicine

## 2015-04-06 LAB — BASIC METABOLIC PANEL
ANION GAP: 5 (ref 5–15)
BUN: 18 mg/dL (ref 6–20)
CALCIUM: 6.9 mg/dL — AB (ref 8.9–10.3)
CO2: 19 mmol/L — ABNORMAL LOW (ref 22–32)
Chloride: 112 mmol/L — ABNORMAL HIGH (ref 101–111)
Creatinine, Ser: 1.3 mg/dL — ABNORMAL HIGH (ref 0.61–1.24)
GFR, EST AFRICAN AMERICAN: 55 mL/min — AB (ref >60)
GFR, EST NON AFRICAN AMERICAN: 47 mL/min — AB (ref >60)
GLUCOSE: 125 mg/dL — AB (ref 65–99)
Potassium: 4.4 mmol/L (ref 3.5–5.1)
Sodium: 136 mmol/L (ref 135–145)

## 2015-04-06 LAB — HEMOGLOBIN AND HEMATOCRIT, BLOOD
HCT: 30.5 % — ABNORMAL LOW (ref 39.0–52.0)
HEMATOCRIT: 29.8 % — AB (ref 39.0–52.0)
HEMOGLOBIN: 9.8 g/dL — AB (ref 13.0–17.0)
Hemoglobin: 10 g/dL — ABNORMAL LOW (ref 13.0–17.0)

## 2015-04-06 MED ORDER — HYOSCYAMINE SULFATE 0.125 MG SL SUBL: 0.1250 mg | SUBLINGUAL_TABLET | SUBLINGUAL | Status: DC | PRN

## 2015-04-06 MED ORDER — DORZOLAMIDE HCL-TIMOLOL MAL 2-0.5 % OP SOLN
1.0000 [drp] | Freq: Two times a day (BID) | OPHTHALMIC | Status: DC
  Administered 2015-04-06 – 2015-04-07 (×4): 1 [drp] via OPHTHALMIC
  Filled 2015-04-06: qty 10

## 2015-04-06 MED ORDER — BELLADONNA ALKALOIDS-OPIUM 16.2-60 MG RE SUPP
1.0000 | Freq: Four times a day (QID) | RECTAL | Status: DC | PRN
  Administered 2015-04-06: 1 via RECTAL
  Filled 2015-04-06: qty 1

## 2015-04-06 MED ORDER — TRAMADOL-ACETAMINOPHEN 37.5-325 MG PO TABS: 1.0000 | ORAL_TABLET | Freq: Four times a day (QID) | ORAL | Status: DC | PRN

## 2015-04-06 MED ORDER — CEPHALEXIN 500 MG PO CAPS: 500.0000 mg | ORAL_CAPSULE | Freq: Two times a day (BID) | ORAL | Status: DC

## 2015-04-06 NOTE — Progress Notes (Signed)
Urology Progress Note  1 Day Post-Op   Subjective: Bleeding today. On Lovenox. Clots hand irrigated. Pt refused to walk, or take shower.     No acute urologic events overnight. Ambulation:   negative Flatus:    negative Bowel movement  negative  Pain: complete resolution  Objective:  Blood pressure 104/47, pulse 60, temperature 98.4 F (36.9 C), temperature source Oral, resp. rate 16, height 5\' 10"  (1.778 m), weight 77.111 kg (170 lb), SpO2 100 %.  Physical Exam:  General:  No acute distress, awake  Genitourinary:  Normal flat abd.  Foley: urine pink with CBI.     I/O last 3 completed shifts: In: 37908.3 [I.V.:1508.3; Other:36400] Out: [id number] [Urine:36825]  Recent Labs     04/06/15  0513  04/06/15  1144  HGB  9.8*  10.0*    Recent Labs     04/06/15  0513  NA  136  K  4.4  CL  112*  CO2  19*  BUN  18  CREATININE  1.30*  CALCIUM  6.9*  GFRNONAA  47*  GFRAA  55*     No results for input(s): INR, APTT in the last 72 hours.  Invalid input(s): PT   Invalid input(s): ABG  Assessment/Plan:  Very large TURP Thursday with TUR of large median lobe. Pt will not walk in hospital or take a shower ( modesty), and promises 4to do both at home. He has had some bleeding today , thought old blood, related to surgery and Lovenox. Will stom med, and plan for d/c in AM.

## 2015-04-06 NOTE — Discharge Summary (Signed)
Physician Discharge Summary  Patient ID: Oscar Santiago MRN: [id number] DOB/AGE: 80/10/1925 80 y.o.  Admit date: 04/05/2015 Discharge date: 04/06/2015  Admission Diagnoses: BENIGN LOCALIZED HYPERPLASIA  Discharge Diagnoses:  Active Problems:   Benign prostatic hypertrophy   Discharged Condition: stable  Hospital Course: TURP  Significant Diagnostic Studies:  Discharge Exam: Blood pressure 104/47, pulse 60, temperature 98.4 F (36.9 C), temperature source Oral, resp. rate 16, height 5\' 10"  (1.778 m), weight 77.111 kg (170 lb), SpO2 100 %.   Disposition: 01-Home or Self Care  Discharge Instructions    Continue foley catheter    Complete by:  As directed             Medication List    TAKE these medications        cephALEXin 500 MG capsule  Commonly known as:  KEFLEX  Take 1 capsule (500 mg total) by mouth 2 (two) times daily.     hyoscyamine 0.125 MG SL tablet  Commonly known as:  LEVSIN/SL  Place 1 tablet (0.125 mg total) under the tongue every 4 (four) hours as needed for cramping.     traMADol-acetaminophen 37.5-325 MG tablet  Commonly known as:  ULTRACET  Take 1 tablet by mouth every 6 (six) hours as needed for severe pain.  Start taking on:  04/16/2015      ASK your doctor about these medications        acetaminophen 500 MG tablet  Commonly known as:  TYLENOL  Take 1 tablet (500 mg total) by mouth every 8 (eight) hours as needed (pain).     alendronate 70 MG tablet  Commonly known as:  FOSAMAX  Take 70 mg by mouth every Monday.     amLODipine 10 MG tablet  Commonly known as:  NORVASC  Take 10 mg by mouth at bedtime.     aspirin 81 MG tablet  Take 81 mg by mouth daily.     cetirizine 10 MG tablet  Commonly known as:  ZYRTEC  Take 10 mg by mouth daily.     colchicine 0.6 MG tablet  Take 0.6 mg by mouth 2 (two) times daily as needed (gout).     CORICIDIN HBP CONGESTION/COUGH 10-200 MG Caps  Generic drug:  Dextromethorphan-Guaifenesin  Take 1  capsule by mouth daily as needed (for cold).     dorzolamide-timolol 22.3-6.8 MG/ML ophthalmic solution  Commonly known as:  COSOPT  Place 1 drop into both eyes 2 (two) times daily.     ELIQUIS 5 MG Tabs tablet  Generic drug:  apixaban  Take 5 mg by mouth 2 (two) times daily. Reported on 03/13/2015     famotidine 10 MG chewable tablet  Commonly known as:  PEPCID AC  Chew 10 mg by mouth daily as needed for heartburn.     finasteride 5 MG tablet  Commonly known as:  PROSCAR  Take 1 tablet (5 mg total) by mouth daily.     fluticasone 50 MCG/ACT nasal spray  Commonly known as:  FLONASE  USE 2 SPRAYS IN EACH NOSTRIL ONCE A DAY     furosemide 20 MG tablet  Commonly known as:  LASIX  Take 1 tablet (20 mg total) by mouth daily.     latanoprost 0.005 % ophthalmic solution  Commonly known as:  XALATAN  Place 1 drop into both eyes at bedtime.     levETIRAcetam 500 MG tablet  Commonly known as:  KEPPRA  Take 1 tablet (500 mg total) by mouth 2 (two)  times daily. Take 1/2 tablet (250 mg) by mouth every morning and 1 tablet (500 mg) every night     levothyroxine 25 MCG tablet  Commonly known as:  SYNTHROID, LEVOTHROID  TAKE 1 TABLET (25 MCG TOTAL) BY MOUTH DAILY BEFORE BREAKFAST.     multivitamin with minerals Tabs tablet  Take 1 tablet by mouth daily.     tamsulosin 0.4 MG Caps capsule  Commonly known as:  FLOMAX  Take 2 capsules (0.8 mg total) by mouth daily after breakfast.     valsartan 320 MG tablet  Commonly known as:  DIOVAN  Take 1 tablet (320 mg total) by mouth daily with lunch.     vitamin B-12 1000 MCG tablet  Commonly known as:  CYANOCOBALAMIN  Take 1,000 mcg by mouth daily.     zolpidem 5 MG tablet  Commonly known as:  AMBIEN  Take 5 mg by mouth at bedtime.           Follow-up Information    Follow up with Kathi Ludwig, MD.   Specialty:  Urology   Why:  call kim at [phone] for appointment time for voiding trial   Contact information:   [address] [place] [address] [phone]       Signed: Kathi Ludwig 04/06/2015, 5:59 PM

## 2015-04-06 NOTE — Progress Notes (Signed)
Pt is unsure if he will wear CPAP tonight per Daughter.  Pt's family will notify RT if Pt decides to wear his home CPAP tonight.  RT to monitor and assess as needed.

## 2015-04-06 NOTE — Discharge Instructions (Signed)
Benign Prostatic Hyperplasia °An enlarged prostate (benign prostatic hyperplasia) is common in older men. You may experience the following: °· Weak urine stream. °· Dribbling. °· Feeling like the bladder has not emptied completely. °· Difficulty starting urination. °· Getting up frequently at night to urinate. °· Urinating more frequently during the day. °HOME CARE INSTRUCTIONS  °Monitor your prostatic hyperplasia for any changes. The following actions may help to alleviate any discomfort you are experiencing: °· Give yourself time when you urinate. °· Stay away from alcohol. °· Avoid beverages containing caffeine, such as coffee, tea, and colas, because they can make the problem worse. °· Avoid decongestants, antihistamines, and some prescription medicines that can make the problem worse. °· Follow up with your health care provider for further treatment as recommended. °SEEK MEDICAL CARE IF: °· You are experiencing progressive difficulty voiding. °· Your urine stream is progressively getting narrower. °· You are awaking from sleep with the urge to void more frequently. °· You are constantly feeling the need to void. °· You experience loss of urine, especially in small amounts. °SEEK IMMEDIATE MEDICAL CARE IF:  °· You develop increased pain with urination or are unable to urinate. °· You develop severe abdominal pain, vomiting, a high fever, or fainting. °· You develop back pain or blood in your urine. °MAKE SURE YOU:  °· Understand these instructions. °· Will watch your condition. °· Will get help right away if you are not doing well or get worse. °  °This information is not intended to replace advice given to you by your health care provider. Make sure you discuss any questions you have with your health care provider. °  °Document Released: 01/20/2005 Document Revised: 02/10/2014 Document Reviewed: 06/22/2012 °Elsevier Interactive Patient Education ©2016 [employer] °Post transurethral resection of the prostate  (TURP) instructions ° °Your recent prostate surgery requires very special post hospital care. Despite the fact that no skin incisions were used the area around the prostate incision is quite raw and is covered with a scab to promote healing and prevent bleeding. Certain cautions are needed to assure that the scab is not disturbed of the next 2-3 weeks while the healing proceeds. ° °Because the raw surface in your prostate and the irritating effects of urine you may expect frequency of urination and/or urgency (a stronger desire to urinate) and perhaps even getting up at night more often. This will usually resolve or improve slowly over the healing period. You may see some blood in your urine over the first 6 weeks. Do not be alarmed, even if the urine was clear for a while. Get off your feet and drink lots of fluids until clearing occurs. If you start to pass clots or don't improve call us. ° °Diet: ° °You may return to your normal diet immediately. Because of the raw surface of your bladder, alcohol, spicy foods, foods high in acid and drinks with caffeine may cause irritation or frequency and should be used in moderation. To keep your urine flowing freely and avoid constipation, drink plenty of fluids during the day (8-10 glasses). Tip: Avoid cranberry juice because it is very acidic. ° °Activity: ° °Your physical activity doesn't need to be restricted. However, if you are very active, you may see some blood in the urine. We suggest that you reduce your activity under the circumstances until the bleeding has stopped. ° °Bowels: ° °It is important to keep your bowels regular during the postoperative period. Straining with bowel movements can cause bleeding. A bowel   movement every other day is reasonable. Use a mild laxative if needed, such as milk of magnesia 2-3 tablespoons, or 2 Dulcolax tablets. Call if you continue to have problems. If you had been taking narcotics for pain, before, during or after your  surgery, you may be constipated. Take a laxative if necessary. ° °Medication: ° °You should resume your pre-surgery medications unless told not to. In addition you may be given an antibiotic to prevent or treat infection. Antibiotics are not always necessary. All medication should be taken as prescribed until the bottles are finished unless you are having an unusual reaction to one of the drugs. ° ° ° ° °Problems you should report to us: ° °a. Fever greater than 101°F. °b. Heavy bleeding, or clots (see notes above about blood in urine). °c. Inability to urinate. °d. Drug reactions (hives, rash, nausea, vomiting, diarrhea). °e. Severe burning or pain with urination that is not improving. ° °

## 2015-04-06 NOTE — Progress Notes (Signed)
Urology Progress Note  1 Day Post-Op   Subjective: POD 1 s/p TURP large prostate      Pt had spasms overnight, CBI running. S-p tube leaking-dressing changed. B/o suppository diven. Pt has small seizure this Am ( has hx of multiple small seizures, Rx keppra for many years, per wife). Now awake and alert, and oriented.     No acute urologic events overnight. Ambulation:   negative Flatus:    negative Bowel movement  negative  Pain: complete resolution  Objective:  Blood pressure 113/48, pulse 59, temperature 98.9 F (37.2 C), temperature source Oral, resp. rate 16, height 5\' 10"  (1.778 m), weight 77.111 kg (170 lb), SpO2 100 %.  Physical Exam:  General:  No acute distress, awake Extremities: extremities normal, atraumatic, no cyanosis or edema Genitourinary:  Abd: Soft. No pain to palpation. S-p tube draining well. Dressing dry. Penis normal,.  Foley:  Catheter in good position. Urine clear with CBI.     I/O last 3 completed shifts: In: 7908.3 [I.V.:908.3; Other:7000] Out: [id number] [Urine:36825]  Recent Labs     04/06/15  0513  HGB  9.8*    No results for input(s): NA, K, CL, CO2, BUN, CREATININE, CALCIUM, GFRNONAA, GFRAA in the last 72 hours.  Invalid input(s): MAGNESIUM   No results for input(s): INR, APTT in the last 72 hours.  Invalid input(s): PT   Invalid input(s): ABG  Assessment/Plan:  Continue any current medications. D/c discussed with wife. Pt refused to take shower in hospital. He refuses to use his C-pap in hospital last night.  He may be able to be discharged this afternoon, if urine clear; or in AM on Septra. Will RTC next week for cath removal.

## 2015-04-07 NOTE — Care Management Note (Addendum)
Case Management Note  Patient Details  Name: Oscar Santiago MRN: [id number] Date of Birth: 02/27/1925  Subjective/Objective:                 elective TURP   Action/Plan: NCM spoke to to pt and grand-dtr, Cleophus Moltonnie Gatson, [phone]. Grand-dtr states pt has 24 hour caregivers. Pt has RW and cane. [facility]    Expected Discharge Date:  04/07/2015               Expected Discharge Plan:  Home w Home Health Services  In-House Referral:  NA  Discharge planning Services  CM Consult  Post Acute Care Choice:  NA Choice offered to:  NA  DME Arranged:  N/A DME Agency:  NA  HH Arranged:  PT HH Agency:  [facility]  Status of Service:  Completed, signed off  Medicare Important Message Given:    Date Medicare IM Given:    Medicare IM give by:    Date Additional Medicare IM Given:    Additional Medicare Important Message give by:     If discussed at Long Length of Stay Meetings, dates discussed:    Additional Comments:  Elliot CousinShavis, Jailan Trimm Ellen, RN 04/07/2015, 10:00 AM

## 2015-04-07 NOTE — Discharge Summary (Addendum)
Physician Discharge Summary  Patient ID: Oscar Santiago N Murdock MRN: [id number] DOB/AGE: 80/10/1925 82 y.o.  Admit date: 04/05/2015 Discharge date: 04/07/2015  Admission Diagnoses: BENIGN LOCALIZED HYPERPLASIA  Discharge Diagnoses:  Active Problems:   Benign prostatic hypertrophy   Discharged Condition: good  Hospital Course: He underwent elective TURP and did well postoperatively. His catheter was removed. His daughter indicates that he has not been ambulating and requested home PT. Arrangements were made for this. He is felt to be ready for discharge at this time.  Significant Diagnostic Studies: No results found.  Discharge Exam: Blood pressure 125/46, pulse 76, temperature 98.2 F (36.8 C), temperature source Oral, resp. rate 17, height 5\' 10"  (1.778 m), weight 77.111 kg (170 lb), SpO2 100 %.   Disposition: 01-Home or Self Care   Addendum: His daughter wanted me to be aware that he has a history of seizure disorder and did have some seizures while in the hospital. This is not unusual for him. His electrolytes did not appear to be abnormal.  Discharge Instructions    Continue foley catheter    Complete by:  As directed      Discharge patient    Complete by:  As directed             Medication List    STOP taking these medications        finasteride 5 MG tablet  Commonly known as:  PROSCAR     tamsulosin 0.4 MG Caps capsule  Commonly known as:  FLOMAX      TAKE these medications        acetaminophen 500 MG tablet  Commonly known as:  TYLENOL  Take 1 tablet (500 mg total) by mouth every 8 (eight) hours as needed (pain).     alendronate 70 MG tablet  Commonly known as:  FOSAMAX  Take 70 mg by mouth every Monday.     amLODipine 10 MG tablet  Commonly known as:  NORVASC  Take 10 mg by mouth at bedtime.     aspirin 81 MG tablet  Take 81 mg by mouth daily.     cephALEXin 500 MG capsule  Commonly known as:  KEFLEX  Take 1 capsule (500 mg total) by mouth 2 (two)  times daily.     cetirizine 10 MG tablet  Commonly known as:  ZYRTEC  Take 10 mg by mouth daily.     colchicine 0.6 MG tablet  Take 0.6 mg by mouth 2 (two) times daily as needed (gout).     CORICIDIN HBP CONGESTION/COUGH 10-200 MG Caps  Generic drug:  Dextromethorphan-Guaifenesin  Take 1 capsule by mouth daily as needed (for cold).     dorzolamide-timolol 22.3-6.8 MG/ML ophthalmic solution  Commonly known as:  COSOPT  Place 1 drop into both eyes 2 (two) times daily.     ELIQUIS 5 MG Tabs tablet  Generic drug:  apixaban  Take 5 mg by mouth 2 (two) times daily. Reported on 03/13/2015     famotidine 10 MG chewable tablet  Commonly known as:  PEPCID AC  Chew 10 mg by mouth daily as needed for heartburn.     fluticasone 50 MCG/ACT nasal spray  Commonly known as:  FLONASE  USE 2 SPRAYS IN EACH NOSTRIL ONCE A DAY     furosemide 20 MG tablet  Commonly known as:  LASIX  Take 1 tablet (20 mg total) by mouth daily.     hyoscyamine 0.125 MG SL tablet  Commonly known as:  LEVSIN/SL  Place 1 tablet (0.125 mg total) under the tongue every 4 (four) hours as needed for cramping.     latanoprost 0.005 % ophthalmic solution  Commonly known as:  XALATAN  Place 1 drop into both eyes at bedtime.     levETIRAcetam 500 MG tablet  Commonly known as:  KEPPRA  Take 1 tablet (500 mg total) by mouth 2 (two) times daily. Take 1/2 tablet (250 mg) by mouth every morning and 1 tablet (500 mg) every night     levothyroxine 25 MCG tablet  Commonly known as:  SYNTHROID, LEVOTHROID  TAKE 1 TABLET (25 MCG TOTAL) BY MOUTH DAILY BEFORE BREAKFAST.     multivitamin with minerals Tabs tablet  Take 1 tablet by mouth daily.     traMADol-acetaminophen 37.5-325 MG tablet  Commonly known as:  ULTRACET  Take 1 tablet by mouth every 6 (six) hours as needed for severe pain.  Start taking on:  04/16/2015     valsartan 320 MG tablet  Commonly known as:  DIOVAN  Take 1 tablet (320 mg total) by mouth daily with  lunch.     vitamin B-12 1000 MCG tablet  Commonly known as:  CYANOCOBALAMIN  Take 1,000 mcg by mouth daily.     zolpidem 5 MG tablet  Commonly known as:  AMBIEN  Take 5 mg by mouth at bedtime.           Follow-up Information    Follow up with Kathi Ludwig, MD.   Specialty:  Urology   Why:  call kim at [phone] for appointment time for voiding trial   Contact information:   [address] [place] [address] [phone]       Signed: [place] 04/07/2015, 8:30 AM

## 2015-04-10 ENCOUNTER — Telehealth: Payer: Self-pay | Admitting: *Deleted

## 2015-04-10 ENCOUNTER — Telehealth: Payer: Self-pay | Admitting: General Practice

## 2015-04-10 NOTE — Telephone Encounter (Signed)
Why has she stopped giving BP meds to [name].  Was he given BP meds during recent hospitalization?

## 2015-04-10 NOTE — Telephone Encounter (Signed)
The granddaughter stated that when is dystolic number dropped in the hospital they stopped his medication, so that is what she is doing at home.

## 2015-04-10 NOTE — Telephone Encounter (Signed)
Spoke with granddaughter and per Dr Artist PaisYoo she should try 1 divovan and watch patient's blood pressure.  She is aware and states that if he is not doing better she will take him back to the hospital.

## 2015-04-10 NOTE — Telephone Encounter (Signed)
Transition Care Management Follow-up Telephone Call  How have you been since you were released from the hospital? A little rough. Still has some swelling.  Information given by granddaughter.   Do you understand why you were in the hospital? yes   Do you understand the discharge instrcutions? yes  Items Reviewed:  Medications reviewed: yes  Allergies reviewed: yes  Dietary changes reviewed: yes  Referrals reviewed: yes   Functional Questionnaire:   Activities of Daily Living (ADLs):   He states they are independent in the following: use of walker and catheter  States they require assistance with the following: unknown   Any transportation issues/concerns?: no   Any patient concerns? no   Confirmed importance and date/time of follow-up visits scheduled: yes   Confirmed with patient if condition begins to worsen call PCP or go to the ER.  Patient was given the Call-a-Nurse line [phone]: yes 1st attempt.  TCM. Left message on machine for patient to return our call. Patient was discharged 04/07/15 Patient was discharged to his home Patient has an appointment with Dr Caryl NeverBurchette

## 2015-04-10 NOTE — Telephone Encounter (Signed)
Patient was discharged from the hospital 04/07/15.  Patient has an appointment with Dr Caryl NeverBurchette 04/13/15.  Granddaughter is calling because the patient's blood pressure is unstable.  Last night his blood pressure was 133/47, this morning it is 169/49, and his hear rate has been in the 60's.  The granddaughter has not been giving the patient his blood pressure medication.  Please advise.

## 2015-04-11 NOTE — Telephone Encounter (Signed)
Message clarification.  I suggested if he restarts valsartan, he start with lower dose.  He can cut current diovan 320 mg in half and take 160 mg.

## 2015-04-12 NOTE — Telephone Encounter (Signed)
Left message on machine for Junious DresserConnie to return our call

## 2015-04-13 ENCOUNTER — Encounter: Payer: Self-pay | Admitting: Family Medicine

## 2015-04-13 ENCOUNTER — Ambulatory Visit (INDEPENDENT_AMBULATORY_CARE_PROVIDER_SITE_OTHER): Payer: Medicare Other | Admitting: Family Medicine

## 2015-04-13 VITALS — BP 138/88 | Temp 97.8°F | Wt 196.8 lb

## 2015-04-13 DIAGNOSIS — R300 Dysuria: Secondary | ICD-10-CM | POA: Diagnosis not present

## 2015-04-13 DIAGNOSIS — N400 Enlarged prostate without lower urinary tract symptoms: Secondary | ICD-10-CM | POA: Diagnosis not present

## 2015-04-13 DIAGNOSIS — R600 Localized edema: Secondary | ICD-10-CM

## 2015-04-13 LAB — POCT URINALYSIS DIPSTICK
BILIRUBIN UA: NEGATIVE
GLUCOSE UA: NEGATIVE
Ketones, UA: NEGATIVE
NITRITE UA: NEGATIVE
PH UA: 6
Spec Grav, UA: 1.01
Urobilinogen, UA: 0.2

## 2015-04-13 NOTE — Telephone Encounter (Signed)
Granddaughter is aware 

## 2015-04-13 NOTE — Patient Instructions (Signed)
Elevate legs frequently Consider giving 40 mg lasix for the next couple of days.

## 2015-04-13 NOTE — Progress Notes (Addendum)
Subjective:    Patient ID: Oscar Santiago, male    DOB: [birth year], 82 y.o.   MRN: [id number]  HPI  Patient is here for recent hospital follow-up. He has history of BPH and on March 2 underwent elective TURP. Has suprapubic catheter in place.  Patient was discharged on Keflex 500 mg twice daily and also taking hyoscyamine as needed for spasms. He has not had any fever or chills. He is having some urination through the urethra but this has been slow to recover. Still has suprapubic catheter in place for drainage.  Has some mild intermittent pain with urination. No gross hematuria.   Appetite has been relatively poor. No cough. No chest pains. No dizziness.  Has some increased bilateral leg edema since discharge.  On Lasix 40 mg po qd.  No increased dyspnea.  No orthopnea.  Did receive significant IVFs recently during hospital stay.  Past Medical History  Diagnosis Date  . Hypertension   . Seizure disorder (HCC)   . Glaucoma   . Hyperlipidemia   . Osteopenia   . Compression fracture of spine (HCC)     T 10 and T11  . Gout   . OSA (obstructive sleep apnea)     on CPAP therapy  . Hypothyroid   . Inguinal hernia   . Hemorrhoid   . Chronic systolic dysfunction of left ventricle     EF 30-35% by echo 10/10/2009  . Major depression (HCC)   . History of GI bleed     from coumadin  . MR (mitral regurgitation)   . Hearing loss     bilateral -hearing aids  . Dizziness     intermittent  . Kidney stone   . CKD (chronic kidney disease), stage III   . Permanent atrial fibrillation (HCC)     refuses coumadin due to history of GI bleed  . Decreased appetite 03/02/12  . Weight loss 03/02/12  . Complete heart block (HCC)   . CHF (congestive heart failure) (HCC)   . Seizures (HCC)   . Glaucoma   . Stroke [facility])    Past Surgical History  Procedure Laterality Date  . Transurethral resection of prostate    . Cardiac catheterization  2002  . Pacemaker insertion  07/28/06    VVI pacemaker for  complete heart block by Dr Amil Amen  . Inguinal hernia repair    . Hemorrhoid surgery    . Colonoscopy with propofol N/A 05/01/2014    Procedure: COLONOSCOPY WITH PROPOFOL;  Surgeon: [name], MD;  Location: [facility] ENDOSCOPY;  Service: Endoscopy;  Laterality: N/A;  . Endarterectomy Right 10/23/2014    Procedure: ENDARTERECTOMY CAROTID;  Surgeon: Larina Earthly, MD;  Location: [facility] OR;  Service: Vascular;  Laterality: Right;  . Ep implantable device N/A 12/26/2014    Procedure: PPM Generator Changeout;  Surgeon: Marinus Maw, MD;  Location: [facility] INVASIVE CV LAB;  Service: Cardiovascular;  Laterality: N/A;  . Transurethral resection of prostate N/A 04/05/2015    Procedure: TRANSURETHRAL RESECTION OF THE PROSTATE (TURP);  Surgeon: Jethro Bolus, MD;  Location: [facility] ORS;  Service: Urology;  Laterality: N/A;  . Insertion of suprapubic catheter N/A 04/05/2015    Procedure: INSERTION OF SUPRAPUBIC CATHETER;  Surgeon: Jethro Bolus, MD;  Location: [facility] ORS;  Service: Urology;  Laterality: N/A;    reports that he has never smoked. He has never used smokeless tobacco. He reports that he does not drink alcohol or use illicit drugs. family history includes Aneurysm in  his brother; CVA in his brother and sister; Heart failure in his mother; Transient ischemic attack in his daughter. There is no history of Colon cancer or Colon polyps. Allergies  Allergen Reactions  . Coumadin [Warfarin Sodium] Other (See Comments)    GI Bleed   . Warfarin Other (See Comments)    EXCESS BLDG W/COUMADIN EXCESS BLDG W/COUMADIN  . Lisinopril Cough      Review of Systems  Constitutional: Positive for appetite change. Negative for fever and chills.  Respiratory: Negative for shortness of breath.   Cardiovascular: Negative for chest pain.  Gastrointestinal: Negative for nausea, vomiting and abdominal pain.  Endocrine: Negative for polydipsia and polyuria.  Genitourinary: Positive for difficulty urinating.  Neurological:  Negative for dizziness and syncope.  Psychiatric/Behavioral: Negative for confusion.       Objective:   Physical Exam  Constitutional: He appears well-developed and well-nourished.  HENT:  Mouth/Throat: Oropharynx is clear and moist.  Neck: Neck supple.  Cardiovascular: Normal rate and regular rhythm.   Pulmonary/Chest: Effort normal and breath sounds normal. No respiratory distress. He has no wheezes. He has no rales.  Musculoskeletal:  Patient does have 1+pitting edema of both legs.  Neurological: He is alert.  Psychiatric: He has a normal mood and affect. His behavior is normal.          Assessment & Plan:  Recent TURP with suprapubic catheter which they are capping and he is starting to urinate some.  Some burning.  No fever or chills. Pt on Keflex per Urology.  Keep close follow up with Urology.    Follow up immediately for any fever, confusion, or other changes. UA shows moderate leukocytes and blood- not surprising with catheter in place. Urine cx sent and continue with Keflex in the meantime. Recent hospital records reviewed.    Bilateral leg edema- ?related to recent IVFs in hospital.  They will increase his Furosemide to 40 mg po bid for 3 days and then drop back to qd.  Monitor home weights.

## 2015-04-13 NOTE — Progress Notes (Signed)
Pre visit review using our clinic review tool, if applicable. No additional management support is needed unless otherwise documented below in the visit note. 

## 2015-04-17 LAB — URINE CULTURE: Colony Count: 30000

## 2015-04-30 ENCOUNTER — Emergency Department (HOSPITAL_COMMUNITY)
Admission: EM | Admit: 2015-04-30 | Discharge: 2015-04-30 | Disposition: A | Discharge status: Home / Self Care | Payer: Medicare Other | Attending: Emergency Medicine | Admitting: Emergency Medicine

## 2015-04-30 ENCOUNTER — Encounter (HOSPITAL_COMMUNITY): Payer: Self-pay | Admitting: *Deleted

## 2015-04-30 DIAGNOSIS — H409 Unspecified glaucoma: Secondary | ICD-10-CM | POA: Diagnosis not present

## 2015-04-30 DIAGNOSIS — G4733 Obstructive sleep apnea (adult) (pediatric): Secondary | ICD-10-CM | POA: Diagnosis not present

## 2015-04-30 DIAGNOSIS — I509 Heart failure, unspecified: Secondary | ICD-10-CM | POA: Diagnosis not present

## 2015-04-30 DIAGNOSIS — N183 Chronic kidney disease, stage 3 (moderate): Secondary | ICD-10-CM | POA: Diagnosis not present

## 2015-04-30 DIAGNOSIS — Z87442 Personal history of urinary calculi: Secondary | ICD-10-CM | POA: Diagnosis not present

## 2015-04-30 DIAGNOSIS — I129 Hypertensive chronic kidney disease with stage 1 through stage 4 chronic kidney disease, or unspecified chronic kidney disease: Secondary | ICD-10-CM | POA: Diagnosis not present

## 2015-04-30 DIAGNOSIS — E039 Hypothyroidism, unspecified: Secondary | ICD-10-CM | POA: Insufficient documentation

## 2015-04-30 DIAGNOSIS — M109 Gout, unspecified: Secondary | ICD-10-CM | POA: Insufficient documentation

## 2015-04-30 DIAGNOSIS — Z8781 Personal history of (healed) traumatic fracture: Secondary | ICD-10-CM | POA: Diagnosis not present

## 2015-04-30 DIAGNOSIS — Z7951 Long term (current) use of inhaled steroids: Secondary | ICD-10-CM | POA: Diagnosis not present

## 2015-04-30 DIAGNOSIS — R339 Retention of urine, unspecified: Secondary | ICD-10-CM

## 2015-04-30 DIAGNOSIS — F329 Major depressive disorder, single episode, unspecified: Secondary | ICD-10-CM | POA: Insufficient documentation

## 2015-04-30 DIAGNOSIS — Z8673 Personal history of transient ischemic attack (TIA), and cerebral infarction without residual deficits: Secondary | ICD-10-CM | POA: Diagnosis not present

## 2015-04-30 DIAGNOSIS — Z9981 Dependence on supplemental oxygen: Secondary | ICD-10-CM | POA: Diagnosis not present

## 2015-04-30 DIAGNOSIS — Z79899 Other long term (current) drug therapy: Secondary | ICD-10-CM | POA: Diagnosis not present

## 2015-04-30 DIAGNOSIS — Z792 Long term (current) use of antibiotics: Secondary | ICD-10-CM | POA: Diagnosis not present

## 2015-04-30 DIAGNOSIS — Z8719 Personal history of other diseases of the digestive system: Secondary | ICD-10-CM | POA: Diagnosis not present

## 2015-04-30 DIAGNOSIS — H9193 Unspecified hearing loss, bilateral: Secondary | ICD-10-CM | POA: Insufficient documentation

## 2015-04-30 DIAGNOSIS — I482 Chronic atrial fibrillation: Secondary | ICD-10-CM | POA: Insufficient documentation

## 2015-04-30 DIAGNOSIS — Z9889 Other specified postprocedural states: Secondary | ICD-10-CM | POA: Diagnosis not present

## 2015-04-30 DIAGNOSIS — Z7982 Long term (current) use of aspirin: Secondary | ICD-10-CM | POA: Insufficient documentation

## 2015-04-30 DIAGNOSIS — G40909 Epilepsy, unspecified, not intractable, without status epilepticus: Secondary | ICD-10-CM | POA: Diagnosis not present

## 2015-04-30 LAB — URINALYSIS, ROUTINE W REFLEX MICROSCOPIC
BILIRUBIN URINE: NEGATIVE
GLUCOSE, UA: NEGATIVE mg/dL
KETONES UR: NEGATIVE mg/dL
Nitrite: NEGATIVE
PH: 6.5 (ref 5.0–8.0)
PROTEIN: NEGATIVE mg/dL
Specific Gravity, Urine: 1.005 (ref 1.005–1.030)

## 2015-04-30 LAB — URINE MICROSCOPIC-ADD ON

## 2015-04-30 MED ORDER — OXYCODONE-ACETAMINOPHEN 5-325 MG PO TABS
1.0000 | ORAL_TABLET | ORAL | Status: DC | PRN
  Administered 2015-04-30: 1 via ORAL
  Filled 2015-04-30: qty 1

## 2015-04-30 MED ORDER — NITROFURANTOIN MONOHYD MACRO 100 MG PO CAPS
100.0000 mg | ORAL_CAPSULE | Freq: Once | ORAL | Status: AC
  Administered 2015-04-30: 100 mg via ORAL
  Filled 2015-04-30: qty 1

## 2015-04-30 NOTE — Discharge Instructions (Signed)
Please continue using Macrobid as previously recommended. Please follow-up with urology, informing them of today's visit and all relevant data, please return to the ED if any new or worsening signs or symptoms present.

## 2015-04-30 NOTE — ED Provider Notes (Signed)
CSN: [id number]     Arrival date & time 04/30/15  1340 History   First MD Initiated Contact with Patient 04/30/15 1520     Chief Complaint  Patient presents with  . Urinary Retention    HPI   82 year old male presents today with his daughter with complaints of urinary retention and pain. Daughter notes that approximately 3 weeks ago on 04/05/2015 patient underwent a TURP procedure performed by Dr. Patsi Sears. She reports he developed urinary retention thereafter, 5 days ago had a catheter placed. She reports that this catheter quickly became clogged and had flush that day. She reports she returned again the following day due to inability of the catheter to drain, had the catheter flushed which improved flow. She reports at that time agents urine was taken and cultured, and was placed on Keflex. She notes that she called the office today, was told that the cultures grew out in about X resistant to the Keflex and encouraged to start taking Macrobid.  Patient has had intermittent pain throughout the last several weeks due to bladder spasms, was placed on Mybetriq. She notes that after his first dose this did improve the pain, but made him feel weak and ill. She reports that he stopped taking that medication, this improved his symptoms. She reports the patient began to have pain again this morning, and gave him the medication which again had return of symptoms. Patient reports the pain is pressure-like and located in his bladder, notes that the urine has been going around the catheter and not draining into the bag. Patient denies any fever, chills, nausea, vomiting, dizziness.    Past Medical History  Diagnosis Date  . Hypertension   . Seizure disorder (HCC)   . Glaucoma   . Hyperlipidemia   . Osteopenia   . Compression fracture of spine (HCC)     T 10 and T11  . Gout   . OSA (obstructive sleep apnea)     on CPAP therapy  . Hypothyroid   . Inguinal hernia   . Hemorrhoid   . Chronic systolic  dysfunction of left ventricle     EF 30-35% by echo 10/10/2009  . Major depression (HCC)   . History of GI bleed     from coumadin  . MR (mitral regurgitation)   . Hearing loss     bilateral -hearing aids  . Dizziness     intermittent  . Kidney stone   . CKD (chronic kidney disease), stage III   . Permanent atrial fibrillation (HCC)     refuses coumadin due to history of GI bleed  . Decreased appetite 03/02/12  . Weight loss 03/02/12  . Complete heart block (HCC)   . CHF (congestive heart failure) (HCC)   . Seizures (HCC)   . Glaucoma   . Stroke [facility])    Past Surgical History  Procedure Laterality Date  . Transurethral resection of prostate    . Cardiac catheterization  2002  . Pacemaker insertion  07/28/06    VVI pacemaker for complete heart block by Dr Amil Amen  . Inguinal hernia repair    . Hemorrhoid surgery    . Colonoscopy with propofol N/A 05/01/2014    Procedure: COLONOSCOPY WITH PROPOFOL;  Surgeon: [name], MD;  Location: [facility] ENDOSCOPY;  Service: Endoscopy;  Laterality: N/A;  . Endarterectomy Right 10/23/2014    Procedure: ENDARTERECTOMY CAROTID;  Surgeon: Larina Earthly, MD;  Location: [facility] OR;  Service: Vascular;  Laterality: Right;  . Ep implantable  device N/A 12/26/2014    Procedure: PPM Generator Changeout;  Surgeon: Marinus MawGregg W Taylor, MD;  [facility];  Service: Cardiovascular;  Laterality: N/A;  . Transurethral resection of prostate N/A 04/05/2015    Procedure: TRANSURETHRAL RESECTION OF THE PROSTATE (TURP);  Surgeon: Jethro BolusSigmund Tannenbaum, MD;  Location: [facility] ORS;  Service: Urology;  Laterality: N/A;  . Insertion of suprapubic catheter N/A 04/05/2015    Procedure: INSERTION OF SUPRAPUBIC CATHETER;  Surgeon: Jethro BolusSigmund Tannenbaum, MD;  Location: [facility] ORS;  Service: Urology;  Laterality: N/A;   Family History  Problem Relation Age of Onset  . Heart failure Mother   . CVA Sister   . Aneurysm Brother     brain  . CVA Brother   . Colon cancer Neg Hx   . Colon  polyps Neg Hx   . Transient ischemic attack Daughter    Social History  Substance Use Topics  . Smoking status: Never Smoker   . Smokeless tobacco: Never Used  . Alcohol Use: No    Review of Systems  All other systems reviewed and are negative.   Allergies  Coumadin; Warfarin; and Lisinopril  Home Medications   Prior to Admission medications   Medication Sig Start Date End Date Taking? Authorizing Provider  acetaminophen (TYLENOL) 500 MG tablet Take 1 tablet (500 mg total) by mouth every 8 (eight) hours as needed (pain). 11/09/14  Yes Albertine GratesFang Xu, MD  alendronate (FOSAMAX) 70 MG tablet Take 70 mg by mouth every Monday.  02/05/14  Yes Historical Provider, MD  amLODipine (NORVASC) 10 MG tablet Take 10 mg by mouth at bedtime.    Yes Historical Provider, MD  aspirin 81 MG tablet Take 81 mg by mouth daily.   Yes Historical Provider, MD  cephALEXin (KEFLEX) 500 MG capsule Take 1 capsule (500 mg total) by mouth 2 (two) times daily. 04/06/15  Yes Jethro BolusSigmund Tannenbaum, MD  cetirizine (ZYRTEC) 10 MG tablet Take 10 mg by mouth daily.   Yes Historical Provider, MD  colchicine 0.6 MG tablet Take 0.6 mg by mouth 2 (two) times daily as needed (gout).   Yes Historical Provider, MD  Dextromethorphan-Guaifenesin (CORICIDIN HBP CONGESTION/COUGH) 10-200 MG CAPS Take 1 capsule by mouth daily as needed (for cold). Reported on 04/13/2015   Yes Historical Provider, MD  dorzolamide-timolol (COSOPT) 22.3-6.8 MG/ML ophthalmic solution Place 1 drop into both eyes 2 (two) times daily. 09/09/14  Yes Historical Provider, MD  famotidine (PEPCID AC) 10 MG chewable tablet Chew 10 mg by mouth daily as needed for heartburn.  07/18/04  Yes Historical Provider, MD  finasteride (PROSCAR) 5 MG tablet TAKE 1 TABLET (5 MG TOTAL) BY MOUTH DAILY. 03/29/15  Yes Historical Provider, MD  fluticasone (FLONASE) 50 MCG/ACT nasal spray USE 2 SPRAYS IN EACH NOSTRIL ONCE A DAY 04/06/15  Yes Doe-Hyun R Yoo, DO  furosemide (LASIX) 20 MG tablet Take 1  tablet (20 mg total) by mouth daily. 03/21/15  Yes Lyn RecordsHenry W Smith, MD  hyoscyamine (LEVSIN/SL) 0.125 MG SL tablet Place 1 tablet (0.125 mg total) under the tongue every 4 (four) hours as needed for cramping. 04/06/15  Yes Jethro BolusSigmund Tannenbaum, MD  latanoprost (XALATAN) 0.005 % ophthalmic solution Place 1 drop into both eyes at bedtime.  02/05/14  Yes Historical Provider, MD  levETIRAcetam (KEPPRA) 500 MG tablet Take 1 tablet (500 mg total) by mouth 2 (two) times daily. Take 1/2 tablet (250 mg) by mouth every morning and 1 tablet (500 mg) every night Patient taking differently: Take 250-500 mg  by mouth See admin instructions. Take 1/2 tablet (250 mg) by mouth every morning and 1 tablet (500 mg) every night 01/03/15  Yes Marvel Plan, MD  levothyroxine (SYNTHROID, LEVOTHROID) 25 MCG tablet TAKE 1 TABLET (25 MCG TOTAL) BY MOUTH DAILY BEFORE BREAKFAST. 03/23/15  Yes Doe-Hyun Sherran Needs, DO  Multiple Vitamin (MULTIVITAMIN WITH MINERALS) TABS tablet Take 1 tablet by mouth daily.   Yes Historical Provider, MD  tamsulosin (FLOMAX) 0.4 MG CAPS capsule Take 1 capsule by 03/20/15  Yes Historical Provider, MD  valsartan (DIOVAN) 320 MG tablet Take 1 tablet (320 mg total) by mouth daily with lunch. 03/23/15  Yes Lyn Records, MD  vitamin B-12 (CYANOCOBALAMIN) 1000 MCG tablet Take 1,000 mcg by mouth daily.   Yes Historical Provider, MD  zolpidem (AMBIEN) 5 MG tablet Take 5 mg by mouth at bedtime.   Yes Historical Provider, MD  ELIQUIS 5 MG TABS tablet Take 5 mg by mouth 2 (two) times daily. Reported on 04/13/2015 10/28/14   Historical Provider, MD  traMADol-acetaminophen (ULTRACET) 37.5-325 MG tablet Take 1 tablet by mouth every 6 (six) hours as needed for severe pain. Patient not taking: Reported on 04/13/2015 04/16/15   Jethro Bolus, MD   BP 146/76 mmHg  Pulse 58  Temp(Src) 97.5 F (36.4 C) (Oral)  Resp 16  SpO2 100%   Physical Exam  Constitutional: He is oriented to person, place, and time. He appears well-developed  and well-nourished.  HENT:  Head: Normocephalic and atraumatic.  Eyes: Conjunctivae are normal. Pupils are equal, round, and reactive to light. Right eye exhibits no discharge. Left eye exhibits no discharge. No scleral icterus.  Neck: Normal range of motion. No JVD present. No tracheal deviation present.  Pulmonary/Chest: Effort normal. No stridor.  Abdominal:  Tenderness to palpation of distended bladder. Remainder of upper abdomen nontender to palpation  Genitourinary:  No discharge noted to the penis, no redness, signs of infection  Neurological: He is alert and oriented to person, place, and time. Coordination normal.  Skin: Skin is warm and dry. No rash noted. No erythema. No pallor.  Psychiatric: He has a normal mood and affect. His behavior is normal. Judgment and thought content normal.  Nursing note and vitals reviewed.   ED Course  Procedures (including critical care time) Labs Review Labs Reviewed  URINALYSIS, ROUTINE W REFLEX MICROSCOPIC (NOT AT [facility]) - Abnormal; Notable for the following:    APPearance CLOUDY (*)    Hgb urine dipstick MODERATE (*)    Leukocytes, UA LARGE (*)    All other components within normal limits  URINE MICROSCOPIC-ADD ON - Abnormal; Notable for the following:    Squamous Epithelial / LPF 0-5 (*)    Bacteria, UA FEW (*)    All other components within normal limits  URINE CULTURE    Imaging Review No results found. I have personally reviewed and evaluated these images and lab results as part of my medical decision-making.   EKG Interpretation None      MDM   Final diagnoses:  Urinary retention    Labs:Urinalysis  Imaging:  Consults:  Therapeutics: Oxycodone  Discharge Meds:   Assessment/Plan: Patient's presentation is most consistent with urinary retention. Patient was found to have greater than 1 L of urine in his bladder, with no drainage into the catheter. Nursing staff will attempt to flush the catheter and dislodge the  blockage.   Nursing staff able to replace catheter, free flowing, pain resolved. Patient is afebrile, nontoxic in no acute distress.  Patient will be given a dose of Macrobid here, discharged home with urology follow-up. She'll return precautions given, patient verbalized understanding and agreement today's plan had no further questions or concerns at time of discharge        Eyvonne Mechanic, PA-C 04/30/15 1720  Lorre Nick, MD 05/03/15 [phone]

## 2015-04-30 NOTE — ED Notes (Signed)
Pt's family member reports pt having a TURP done early March and has been having urinary retention since.  Went to see Dr. Patsi Searsannenbaum Thursday d/t retention, inserted a catheter then.  Still having trouble with pain and went back Friday.  She reports pt was having bladder spasms.  Was given Myrbetriq for the spasms without relief but it made him "sick."  States it made him weak.  Was put on Cephalexin but states that he received a call today stating pt is resistant to that abx and to start taking macrobid but is not able to start it.  Pt reports severe pain at this time.

## 2015-04-30 NOTE — ED Notes (Signed)
Pt had a foley cath present on assessment. With <100 mL noted in drainage bag. Bladder scan showed 999+ with the cath in place. Catheter removed and noted to be intact.

## 2015-05-02 ENCOUNTER — Emergency Department (HOSPITAL_COMMUNITY)
Admission: EM | Admit: 2015-05-02 | Discharge: 2015-05-02 | Disposition: A | Discharge status: Home / Self Care | Payer: Medicare Other | Attending: Emergency Medicine | Admitting: Emergency Medicine

## 2015-05-02 ENCOUNTER — Encounter (HOSPITAL_COMMUNITY): Payer: Self-pay | Admitting: *Deleted

## 2015-05-02 DIAGNOSIS — Z9889 Other specified postprocedural states: Secondary | ICD-10-CM | POA: Insufficient documentation

## 2015-05-02 DIAGNOSIS — Z8781 Personal history of (healed) traumatic fracture: Secondary | ICD-10-CM | POA: Insufficient documentation

## 2015-05-02 DIAGNOSIS — Z8673 Personal history of transient ischemic attack (TIA), and cerebral infarction without residual deficits: Secondary | ICD-10-CM | POA: Insufficient documentation

## 2015-05-02 DIAGNOSIS — Z87442 Personal history of urinary calculi: Secondary | ICD-10-CM | POA: Diagnosis not present

## 2015-05-02 DIAGNOSIS — N183 Chronic kidney disease, stage 3 (moderate): Secondary | ICD-10-CM | POA: Diagnosis not present

## 2015-05-02 DIAGNOSIS — G40909 Epilepsy, unspecified, not intractable, without status epilepticus: Secondary | ICD-10-CM | POA: Insufficient documentation

## 2015-05-02 DIAGNOSIS — I252 Old myocardial infarction: Secondary | ICD-10-CM | POA: Diagnosis not present

## 2015-05-02 DIAGNOSIS — Z7952 Long term (current) use of systemic steroids: Secondary | ICD-10-CM | POA: Insufficient documentation

## 2015-05-02 DIAGNOSIS — Z7901 Long term (current) use of anticoagulants: Secondary | ICD-10-CM | POA: Insufficient documentation

## 2015-05-02 DIAGNOSIS — Z7982 Long term (current) use of aspirin: Secondary | ICD-10-CM | POA: Insufficient documentation

## 2015-05-02 DIAGNOSIS — Z79899 Other long term (current) drug therapy: Secondary | ICD-10-CM | POA: Insufficient documentation

## 2015-05-02 DIAGNOSIS — R34 Anuria and oliguria: Secondary | ICD-10-CM | POA: Diagnosis not present

## 2015-05-02 DIAGNOSIS — E039 Hypothyroidism, unspecified: Secondary | ICD-10-CM | POA: Insufficient documentation

## 2015-05-02 DIAGNOSIS — H9193 Unspecified hearing loss, bilateral: Secondary | ICD-10-CM | POA: Insufficient documentation

## 2015-05-02 DIAGNOSIS — I509 Heart failure, unspecified: Secondary | ICD-10-CM | POA: Insufficient documentation

## 2015-05-02 DIAGNOSIS — R339 Retention of urine, unspecified: Secondary | ICD-10-CM | POA: Insufficient documentation

## 2015-05-02 DIAGNOSIS — I129 Hypertensive chronic kidney disease with stage 1 through stage 4 chronic kidney disease, or unspecified chronic kidney disease: Secondary | ICD-10-CM | POA: Diagnosis not present

## 2015-05-02 DIAGNOSIS — G4733 Obstructive sleep apnea (adult) (pediatric): Secondary | ICD-10-CM | POA: Diagnosis not present

## 2015-05-02 DIAGNOSIS — I4891 Unspecified atrial fibrillation: Secondary | ICD-10-CM | POA: Insufficient documentation

## 2015-05-02 DIAGNOSIS — M109 Gout, unspecified: Secondary | ICD-10-CM | POA: Insufficient documentation

## 2015-05-02 DIAGNOSIS — Z9981 Dependence on supplemental oxygen: Secondary | ICD-10-CM | POA: Insufficient documentation

## 2015-05-02 DIAGNOSIS — H409 Unspecified glaucoma: Secondary | ICD-10-CM | POA: Insufficient documentation

## 2015-05-02 NOTE — ED Provider Notes (Signed)
CSN: [id number]     Arrival date & time 05/02/15  0445 History   First MD Initiated Contact with Patient 05/02/15 0740     Chief Complaint  Patient presents with  . Urinary Retention     (Consider location/radiation/quality/duration/timing/severity/associated sxs/prior Treatment) HPI Comments: Presents to the ER for evaluation of acute urinary retention. Patient has a chronic indwelling Foley catheter that has really become clogged with sediment. He has not had any drainage for some time and is experiencing severe discomfort in the area of the bladder currently.   Past Medical History  Diagnosis Date  . Hypertension   . Seizure disorder (HCC)   . Glaucoma   . Hyperlipidemia   . Osteopenia   . Compression fracture of spine (HCC)     T 10 and T11  . Gout   . OSA (obstructive sleep apnea)     on CPAP therapy  . Hypothyroid   . Inguinal hernia   . Hemorrhoid   . Chronic systolic dysfunction of left ventricle     EF 30-35% by echo 10/10/2009  . Major depression (HCC)   . History of GI bleed     from coumadin  . MR (mitral regurgitation)   . Hearing loss     bilateral -hearing aids  . Dizziness     intermittent  . Kidney stone   . CKD (chronic kidney disease), stage III   . Permanent atrial fibrillation (HCC)     refuses coumadin due to history of GI bleed  . Decreased appetite 03/02/12  . Weight loss 03/02/12  . Complete heart block (HCC)   . CHF (congestive heart failure) (HCC)   . Seizures (HCC)   . Glaucoma   . Stroke [facility])    Past Surgical History  Procedure Laterality Date  . Transurethral resection of prostate    . Cardiac catheterization  2002  . Pacemaker insertion  07/28/06    VVI pacemaker for complete heart block by Dr Amil Amen  . Inguinal hernia repair    . Hemorrhoid surgery    . Colonoscopy with propofol N/A 05/01/2014    Procedure: COLONOSCOPY WITH PROPOFOL;  Surgeon: [name], MD;  Location: [facility] ENDOSCOPY;  Service: Endoscopy;  Laterality: N/A;  .  Endarterectomy Right 10/23/2014    Procedure: ENDARTERECTOMY CAROTID;  Surgeon: Larina Earthly, MD;  Location: [facility] OR;  Service: Vascular;  Laterality: Right;  . Ep implantable device N/A 12/26/2014    Procedure: PPM Generator Changeout;  Surgeon: Marinus Maw, MD;  Location: [facility] INVASIVE CV LAB;  Service: Cardiovascular;  Laterality: N/A;  . Transurethral resection of prostate N/A 04/05/2015    Procedure: TRANSURETHRAL RESECTION OF THE PROSTATE (TURP);  Surgeon: Jethro Bolus, MD;  Location: [facility] ORS;  Service: Urology;  Laterality: N/A;  . Insertion of suprapubic catheter N/A 04/05/2015    Procedure: INSERTION OF SUPRAPUBIC CATHETER;  Surgeon: Jethro Bolus, MD;  Location: [facility] ORS;  Service: Urology;  Laterality: N/A;   Family History  Problem Relation Age of Onset  . Heart failure Mother   . CVA Sister   . Aneurysm Brother     brain  . CVA Brother   . Colon cancer Neg Hx   . Colon polyps Neg Hx   . Transient ischemic attack Daughter    Social History  Substance Use Topics  . Smoking status: Never Smoker   . Smokeless tobacco: Never Used  . Alcohol Use: No    Review of Systems  Genitourinary: Positive for decreased  urine volume.  All other systems reviewed and are negative.     Allergies  Coumadin; Warfarin; and Lisinopril  Home Medications   Prior to Admission medications   Medication Sig Start Date End Date Taking? Authorizing Provider  acetaminophen (TYLENOL) 500 MG tablet Take 1 tablet (500 mg total) by mouth every 8 (eight) hours as needed (pain). 11/09/14   Albertine GratesFang Xu, MD  alendronate (FOSAMAX) 70 MG tablet Take 70 mg by mouth every Monday.  02/05/14   Historical Provider, MD  amLODipine (NORVASC) 10 MG tablet Take 10 mg by mouth at bedtime.     Historical Provider, MD  aspirin 81 MG tablet Take 81 mg by mouth daily.    Historical Provider, MD  cephALEXin (KEFLEX) 500 MG capsule Take 1 capsule (500 mg total) by mouth 2 (two) times daily. 04/06/15   Jethro BolusSigmund Tannenbaum, MD   cetirizine (ZYRTEC) 10 MG tablet Take 10 mg by mouth daily.    Historical Provider, MD  colchicine 0.6 MG tablet Take 0.6 mg by mouth 2 (two) times daily as needed (gout).    Historical Provider, MD  Dextromethorphan-Guaifenesin (CORICIDIN HBP CONGESTION/COUGH) 10-200 MG CAPS Take 1 capsule by mouth daily as needed (for cold). Reported on 04/13/2015    Historical Provider, MD  dorzolamide-timolol (COSOPT) 22.3-6.8 MG/ML ophthalmic solution Place 1 drop into both eyes 2 (two) times daily. 09/09/14   Historical Provider, MD  ELIQUIS 5 MG TABS tablet Take 5 mg by mouth 2 (two) times daily. Reported on 04/13/2015 10/28/14   Historical Provider, MD  famotidine (PEPCID AC) 10 MG chewable tablet Chew 10 mg by mouth daily as needed for heartburn.  07/18/04   Historical Provider, MD  finasteride (PROSCAR) 5 MG tablet TAKE 1 TABLET (5 MG TOTAL) BY MOUTH DAILY. 03/29/15   Historical Provider, MD  fluticasone (FLONASE) 50 MCG/ACT nasal spray USE 2 SPRAYS IN EACH NOSTRIL ONCE A DAY 04/06/15   Doe-Hyun R Artist PaisYoo, DO  furosemide (LASIX) 20 MG tablet Take 1 tablet (20 mg total) by mouth daily. 03/21/15   Lyn RecordsHenry W Smith, MD  hyoscyamine (LEVSIN/SL) 0.125 MG SL tablet Place 1 tablet (0.125 mg total) under the tongue every 4 (four) hours as needed for cramping. 04/06/15   Jethro BolusSigmund Tannenbaum, MD  latanoprost (XALATAN) 0.005 % ophthalmic solution Place 1 drop into both eyes at bedtime.  02/05/14   Historical Provider, MD  levETIRAcetam (KEPPRA) 500 MG tablet Take 1 tablet (500 mg total) by mouth 2 (two) times daily. Take 1/2 tablet (250 mg) by mouth every morning and 1 tablet (500 mg) every night Patient taking differently: Take 250-500 mg by mouth See admin instructions. Take 1/2 tablet (250 mg) by mouth every morning and 1 tablet (500 mg) every night 01/03/15   Marvel PlanJindong Xu, MD  levothyroxine (SYNTHROID, LEVOTHROID) 25 MCG tablet TAKE 1 TABLET (25 MCG TOTAL) BY MOUTH DAILY BEFORE BREAKFAST. 03/23/15   Doe-Hyun Sherran Needs Yoo, DO  Multiple Vitamin  (MULTIVITAMIN WITH MINERALS) TABS tablet Take 1 tablet by mouth daily.    Historical Provider, MD  tamsulosin (FLOMAX) 0.4 MG CAPS capsule Take 1 capsule by 03/20/15   Historical Provider, MD  traMADol-acetaminophen (ULTRACET) 37.5-325 MG tablet Take 1 tablet by mouth every 6 (six) hours as needed for severe pain. Patient not taking: Reported on 04/13/2015 04/16/15   Jethro BolusSigmund Tannenbaum, MD  valsartan (DIOVAN) 320 MG tablet Take 1 tablet (320 mg total) by mouth daily with lunch. 03/23/15   Lyn RecordsHenry W Smith, MD  vitamin B-12 (CYANOCOBALAMIN) 1000 MCG tablet  Take 1,000 mcg by mouth daily.    Historical Provider, MD  zolpidem (AMBIEN) 5 MG tablet Take 5 mg by mouth at bedtime.    Historical Provider, MD   There were no vitals taken for this visit. Physical Exam  Constitutional: He is oriented to person, place, and time. He appears well-developed and well-nourished. No distress.  HENT:  Head: Normocephalic and atraumatic.  Right Ear: Hearing normal.  Left Ear: Hearing normal.  Nose: Nose normal.  Mouth/Throat: Oropharynx is clear and moist and mucous membranes are normal.  Eyes: Conjunctivae and EOM are normal. Pupils are equal, round, and reactive to light.  Neck: Normal range of motion. Neck supple.  Cardiovascular: Regular rhythm, S1 normal and S2 normal.  Exam reveals no gallop and no friction rub.   No murmur heard. Pulmonary/Chest: Effort normal and breath sounds normal. No respiratory distress. He exhibits no tenderness.  Abdominal: Soft. Normal appearance and bowel sounds are normal. There is no hepatosplenomegaly. There is no tenderness. There is no rebound, no guarding, no tenderness at McBurney's point and negative Murphy's sign. No hernia.  Musculoskeletal: Normal range of motion.  Neurological: He is alert and oriented to person, place, and time. He has normal strength. No cranial nerve deficit or sensory deficit. Coordination normal. GCS eye subscore is 4. GCS verbal subscore is 5. GCS  motor subscore is 6.  Skin: Skin is warm, dry and intact. No rash noted. No cyanosis.  Psychiatric: He has a normal mood and affect. His speech is normal and behavior is normal. Thought content normal.  Nursing note and vitals reviewed.   ED Course  Procedures (including critical care time) Labs Review Labs Reviewed - No data to display  Imaging Review No results found. I have personally reviewed and evaluated these images and lab results as part of my medical decision-making.   EKG Interpretation None      MDM   Final diagnoses:  Urinary retention    Resented to the ER for evaluation of acute urinary retention. Discussed with Dr. Patsi Sears, urology. He feels that the patient requires a large Foley catheter. His 16 French catheter was changed out to a 22 [place]. Patient had more than a liter of urine in his bladder, which was completely drained with a new catheter.    Gilda Crease, MD 05/02/15 [phone]

## 2015-05-02 NOTE — Discharge Instructions (Signed)
Acute Urinary Retention, Male °Acute urinary retention is the temporary inability to urinate. °This is a common problem in older men. As men age their prostates become larger and block the flow of urine from the bladder. This is usually a problem that has come on gradually.  °HOME CARE INSTRUCTIONS °If you are sent home with a Foley catheter and a drainage system, you will need to discuss the best course of action with your health care provider. While the catheter is in, maintain a good intake of fluids. Keep the drainage bag emptied and lower than your catheter. This is so that contaminated urine will not flow back into your bladder, which could lead to a urinary tract infection. °There are two main types of drainage bags. One is a large bag that usually is used at night. It has a good capacity that will allow you to sleep through the night without having to empty it. The second type is called a leg bag. It has a smaller capacity, so it needs to be emptied more frequently. However, the main advantage is that it can be attached by a leg strap and can go underneath your clothing, allowing you the freedom to move about or leave your home. °Only take over-the-counter or prescription medicines for pain, discomfort, or fever as directed by your health care provider.  °SEEK MEDICAL CARE IF: °· You develop a low-grade fever. °· You experience spasms or leakage of urine with the spasms. °SEEK IMMEDIATE MEDICAL CARE IF:  °· You develop chills or fever. °· Your catheter stops draining urine. °· Your catheter falls out. °· You start to develop increased bleeding that does not respond to rest and increased fluid intake. °MAKE SURE YOU: °· Understand these instructions. °· Will watch your condition. °· Will get help right away if you are not doing well or get worse. °  °This information is not intended to replace advice given to you by your health care provider. Make sure you discuss any questions you have with your health care  provider. °  °Document Released: 04/28/2000 Document Revised: 06/06/2014 Document Reviewed: 07/01/2012 °Elsevier Interactive Patient Education ©2016 [employer] ° °

## 2015-05-02 NOTE — ED Notes (Signed)
Bladder scan pt over 999.  Pt voided after d/cing old cath.    Bladder scan pt again and was at 640.

## 2015-05-02 NOTE — ED Notes (Signed)
Pt was seen on 3/27 and had to have F/C replaced due to not draining; pt states that the catheter slowed its drainage around 1pm yesterday afternoon; pt states that he he took his bladder spasm medication without relief; around 1am catheter quit draining and pt c/o fullness and bladder spasms; pt grandaughter states that she gave him another bladder spasm medication around 1am without relief

## 2015-05-03 LAB — URINE CULTURE
Culture: 60000
Special Requests: NORMAL

## 2015-05-04 ENCOUNTER — Telehealth: Payer: Self-pay | Admitting: *Deleted

## 2015-05-04 NOTE — ED Notes (Signed)
Post ED Visit - Positive Culture Follow-up  Culture report reviewed by antimicrobial stewardship pharmacist:  []  Enzo BiNathan Batchelder, Pharm.D. []  Celedonio MiyamotoJeremy Frens, Pharm.D., BCPS []  Garvin FilaMike Maccia, Pharm.D. []  Georgina PillionElizabeth Martin, Pharm.D., BCPS []  MayoMinh Pham, VermontPharm.D., BCPS, AAHIVP []  Estella HuskMichelle Turner, Pharm.D., BCPS, AAHIVP []  Tennis Mustassie Stewart, Pharm.D. []  Sherle Poeob Vincent, [address] BoulevardPharm[address]. Meagan Decker, Pharm D  Positive urine culture Treated with Macrobid by urologist, organism sensitive to the same and no further patient follow-up is required at this time.  Virl AxeRobertson, Ethie Curless [facility] 05/04/2015, 1:53 PM

## 2015-05-05 ENCOUNTER — Telehealth: Payer: Self-pay | Admitting: Internal Medicine

## 2015-05-07 ENCOUNTER — Inpatient Hospital Stay (HOSPITAL_COMMUNITY)
Admission: EM | Admit: 2015-05-07 | Discharge: 2015-05-10 | DRG: 699 | Disposition: A | Discharge status: Home Health | Payer: Medicare Other | Attending: Internal Medicine | Admitting: Internal Medicine

## 2015-05-07 ENCOUNTER — Emergency Department (HOSPITAL_COMMUNITY): Payer: Medicare Other

## 2015-05-07 ENCOUNTER — Encounter (HOSPITAL_COMMUNITY): Payer: Self-pay | Admitting: Emergency Medicine

## 2015-05-07 DIAGNOSIS — D638 Anemia in other chronic diseases classified elsewhere: Secondary | ICD-10-CM

## 2015-05-07 DIAGNOSIS — R338 Other retention of urine: Secondary | ICD-10-CM | POA: Diagnosis present

## 2015-05-07 DIAGNOSIS — I482 Chronic atrial fibrillation, unspecified: Secondary | ICD-10-CM | POA: Diagnosis present

## 2015-05-07 DIAGNOSIS — R531 Weakness: Secondary | ICD-10-CM | POA: Diagnosis not present

## 2015-05-07 DIAGNOSIS — H9190 Unspecified hearing loss, unspecified ear: Secondary | ICD-10-CM | POA: Diagnosis present

## 2015-05-07 DIAGNOSIS — I480 Paroxysmal atrial fibrillation: Secondary | ICD-10-CM | POA: Diagnosis present

## 2015-05-07 DIAGNOSIS — N183 Chronic kidney disease, stage 3 unspecified: Secondary | ICD-10-CM | POA: Diagnosis present

## 2015-05-07 DIAGNOSIS — I509 Heart failure, unspecified: Secondary | ICD-10-CM

## 2015-05-07 DIAGNOSIS — R110 Nausea: Secondary | ICD-10-CM | POA: Diagnosis present

## 2015-05-07 DIAGNOSIS — F329 Major depressive disorder, single episode, unspecified: Secondary | ICD-10-CM | POA: Diagnosis present

## 2015-05-07 DIAGNOSIS — R197 Diarrhea, unspecified: Secondary | ICD-10-CM | POA: Diagnosis present

## 2015-05-07 DIAGNOSIS — M7989 Other specified soft tissue disorders: Secondary | ICD-10-CM | POA: Diagnosis present

## 2015-05-07 DIAGNOSIS — R748 Abnormal levels of other serum enzymes: Secondary | ICD-10-CM | POA: Diagnosis present

## 2015-05-07 DIAGNOSIS — N401 Enlarged prostate with lower urinary tract symptoms: Secondary | ICD-10-CM | POA: Diagnosis present

## 2015-05-07 DIAGNOSIS — Z950 Presence of cardiac pacemaker: Secondary | ICD-10-CM | POA: Diagnosis present

## 2015-05-07 DIAGNOSIS — G4733 Obstructive sleep apnea (adult) (pediatric): Secondary | ICD-10-CM | POA: Diagnosis present

## 2015-05-07 DIAGNOSIS — M8580 Other specified disorders of bone density and structure, unspecified site: Secondary | ICD-10-CM | POA: Diagnosis present

## 2015-05-07 DIAGNOSIS — I340 Nonrheumatic mitral (valve) insufficiency: Secondary | ICD-10-CM | POA: Diagnosis present

## 2015-05-07 DIAGNOSIS — N400 Enlarged prostate without lower urinary tract symptoms: Secondary | ICD-10-CM | POA: Diagnosis present

## 2015-05-07 DIAGNOSIS — E871 Hypo-osmolality and hyponatremia: Secondary | ICD-10-CM

## 2015-05-07 DIAGNOSIS — I10 Essential (primary) hypertension: Secondary | ICD-10-CM | POA: Diagnosis present

## 2015-05-07 DIAGNOSIS — Z8249 Family history of ischemic heart disease and other diseases of the circulatory system: Secondary | ICD-10-CM

## 2015-05-07 DIAGNOSIS — R339 Retention of urine, unspecified: Secondary | ICD-10-CM | POA: Diagnosis present

## 2015-05-07 DIAGNOSIS — G40909 Epilepsy, unspecified, not intractable, without status epilepticus: Secondary | ICD-10-CM

## 2015-05-07 DIAGNOSIS — I5022 Chronic systolic (congestive) heart failure: Secondary | ICD-10-CM | POA: Diagnosis present

## 2015-05-07 DIAGNOSIS — R5381 Other malaise: Secondary | ICD-10-CM

## 2015-05-07 DIAGNOSIS — I519 Heart disease, unspecified: Secondary | ICD-10-CM | POA: Diagnosis not present

## 2015-05-07 DIAGNOSIS — N390 Urinary tract infection, site not specified: Secondary | ICD-10-CM | POA: Diagnosis present

## 2015-05-07 DIAGNOSIS — M109 Gout, unspecified: Secondary | ICD-10-CM | POA: Diagnosis present

## 2015-05-07 DIAGNOSIS — E039 Hypothyroidism, unspecified: Secondary | ICD-10-CM | POA: Diagnosis present

## 2015-05-07 DIAGNOSIS — T83511A Infection and inflammatory reaction due to indwelling urethral catheter, initial encounter: Secondary | ICD-10-CM | POA: Diagnosis not present

## 2015-05-07 DIAGNOSIS — Z87440 Personal history of urinary (tract) infections: Secondary | ICD-10-CM

## 2015-05-07 DIAGNOSIS — N179 Acute kidney failure, unspecified: Secondary | ICD-10-CM | POA: Diagnosis not present

## 2015-05-07 DIAGNOSIS — K5900 Constipation, unspecified: Secondary | ICD-10-CM | POA: Diagnosis present

## 2015-05-07 DIAGNOSIS — Z823 Family history of stroke: Secondary | ICD-10-CM

## 2015-05-07 DIAGNOSIS — R0682 Tachypnea, not elsewhere classified: Secondary | ICD-10-CM | POA: Diagnosis present

## 2015-05-07 DIAGNOSIS — H409 Unspecified glaucoma: Secondary | ICD-10-CM | POA: Diagnosis present

## 2015-05-07 DIAGNOSIS — I130 Hypertensive heart and chronic kidney disease with heart failure and stage 1 through stage 4 chronic kidney disease, or unspecified chronic kidney disease: Secondary | ICD-10-CM | POA: Diagnosis present

## 2015-05-07 DIAGNOSIS — Z7982 Long term (current) use of aspirin: Secondary | ICD-10-CM

## 2015-05-07 DIAGNOSIS — E785 Hyperlipidemia, unspecified: Secondary | ICD-10-CM | POA: Diagnosis present

## 2015-05-07 LAB — COMPREHENSIVE METABOLIC PANEL
ALBUMIN: 3.2 g/dL — AB (ref 3.5–5.0)
ALT: 12 U/L — AB (ref 17–63)
AST: 34 U/L (ref 15–41)
Alkaline Phosphatase: 72 U/L (ref 38–126)
Anion gap: 12 (ref 5–15)
BUN: 15 mg/dL (ref 6–20)
CHLORIDE: 99 mmol/L — AB (ref 101–111)
CO2: 21 mmol/L — AB (ref 22–32)
CREATININE: 1.42 mg/dL — AB (ref 0.61–1.24)
Calcium: 8.6 mg/dL — ABNORMAL LOW (ref 8.9–10.3)
GFR calc non Af Amer: 43 mL/min — ABNORMAL LOW (ref >60)
GFR, EST AFRICAN AMERICAN: 49 mL/min — AB (ref >60)
GLUCOSE: 121 mg/dL — AB (ref 65–99)
Potassium: 3.7 mmol/L (ref 3.5–5.1)
SODIUM: 132 mmol/L — AB (ref 135–145)
Total Bilirubin: 0.7 mg/dL (ref 0.3–1.2)
Total Protein: 6.9 g/dL (ref 6.5–8.1)

## 2015-05-07 LAB — PROTIME-INR
INR: 1.39 (ref 0.00–1.49)
Prothrombin Time: 17.2 seconds — ABNORMAL HIGH (ref 11.6–15.2)

## 2015-05-07 LAB — URINALYSIS, ROUTINE W REFLEX MICROSCOPIC
Bilirubin Urine: NEGATIVE
GLUCOSE, UA: NEGATIVE mg/dL
Ketones, ur: NEGATIVE mg/dL
Nitrite: NEGATIVE
Protein, ur: NEGATIVE mg/dL
SPECIFIC GRAVITY, URINE: 1.009 (ref 1.005–1.030)
pH: 6 (ref 5.0–8.0)

## 2015-05-07 LAB — CBC
HCT: 29.7 % — ABNORMAL LOW (ref 39.0–52.0)
HEMATOCRIT: 28.9 % — AB (ref 39.0–52.0)
HEMOGLOBIN: 9.8 g/dL — AB (ref 13.0–17.0)
Hemoglobin: 9.8 g/dL — ABNORMAL LOW (ref 13.0–17.0)
MCH: 26.2 pg (ref 26.0–34.0)
MCH: 27.1 pg (ref 26.0–34.0)
MCHC: 33 g/dL (ref 30.0–36.0)
MCHC: 33.9 g/dL (ref 30.0–36.0)
MCV: 79.4 fL (ref 78.0–100.0)
MCV: 79.8 fL (ref 78.0–100.0)
PLATELETS: 226 10*3/uL (ref 150–400)
PLATELETS: 204 10*3/uL (ref 150–400)
RBC: 3.74 MIL/uL — AB (ref 4.22–5.81)
RBC: 3.62 MIL/uL — AB (ref 4.22–5.81)
RDW: 14.6 % (ref 11.5–15.5)
RDW: 14.7 % (ref 11.5–15.5)
WBC: 8 10*3/uL (ref 4.0–10.5)
WBC: 8.3 10*3/uL (ref 4.0–10.5)

## 2015-05-07 LAB — LIPASE, BLOOD: LIPASE: 25 U/L (ref 11–51)

## 2015-05-07 LAB — URINE MICROSCOPIC-ADD ON

## 2015-05-07 LAB — BRAIN NATRIURETIC PEPTIDE: B Natriuretic Peptide: 261.9 pg/mL — ABNORMAL HIGH (ref 0.0–100.0)

## 2015-05-07 LAB — TROPONIN I: TROPONIN I: 0.06 ng/mL — AB (ref <0.031)

## 2015-05-07 LAB — CREATININE, SERUM
Creatinine, Ser: 1.39 mg/dL — ABNORMAL HIGH (ref 0.61–1.24)
GFR calc non Af Amer: 44 mL/min — ABNORMAL LOW (ref >60)
GFR, EST AFRICAN AMERICAN: 51 mL/min — AB (ref >60)

## 2015-05-07 LAB — GLUCOSE, CAPILLARY: GLUCOSE-CAPILLARY: 130 mg/dL — AB (ref 65–99)

## 2015-05-07 MED ORDER — DORZOLAMIDE HCL-TIMOLOL MAL 2-0.5 % OP SOLN
1.0000 [drp] | Freq: Two times a day (BID) | OPHTHALMIC | Status: DC
  Administered 2015-05-07 – 2015-05-10 (×6): 1 [drp] via OPHTHALMIC
  Filled 2015-05-07 (×2): qty 10

## 2015-05-07 MED ORDER — ASPIRIN 81 MG PO CHEW
81.0000 mg | CHEWABLE_TABLET | Freq: Every day | ORAL | Status: DC
Start: 2015-05-07 — End: 2015-05-10
  Administered 2015-05-07 – 2015-05-10 (×4): 81 mg via ORAL
  Filled 2015-05-07 (×4): qty 1

## 2015-05-07 MED ORDER — TRAZODONE HCL 50 MG PO TABS: 25.0000 mg | ORAL_TABLET | Freq: Every evening | ORAL | Status: DC | PRN

## 2015-05-07 MED ORDER — ACETAMINOPHEN 650 MG RE SUPP: 650.0000 mg | Freq: Four times a day (QID) | RECTAL | Status: DC | PRN

## 2015-05-07 MED ORDER — SODIUM CHLORIDE 0.9 % IV SOLN
INTRAVENOUS | Status: DC
  Administered 2015-05-07 – 2015-05-10 (×5): via INTRAVENOUS

## 2015-05-07 MED ORDER — FINASTERIDE 5 MG PO TABS
5.0000 mg | ORAL_TABLET | Freq: Every day | ORAL | Status: DC
  Administered 2015-05-07 – 2015-05-10 (×4): 5 mg via ORAL
  Filled 2015-05-07 (×4): qty 1

## 2015-05-07 MED ORDER — SENNOSIDES-DOCUSATE SODIUM 8.6-50 MG PO TABS: 1.0000 | ORAL_TABLET | Freq: Every evening | ORAL | Status: DC | PRN

## 2015-05-07 MED ORDER — LEVETIRACETAM 500 MG PO TABS
500.0000 mg | ORAL_TABLET | Freq: Two times a day (BID) | ORAL | Status: DC
  Administered 2015-05-07 – 2015-05-10 (×7): 500 mg via ORAL
  Filled 2015-05-07 (×7): qty 1

## 2015-05-07 MED ORDER — FUROSEMIDE 20 MG PO TABS
20.0000 mg | ORAL_TABLET | Freq: Every day | ORAL | Status: DC
  Administered 2015-05-07 – 2015-05-08 (×2): 20 mg via ORAL
  Filled 2015-05-07 (×2): qty 1

## 2015-05-07 MED ORDER — LATANOPROST 0.005 % OP SOLN
1.0000 [drp] | Freq: Every day | OPHTHALMIC | Status: DC
  Administered 2015-05-07 – 2015-05-09 (×3): 1 [drp] via OPHTHALMIC
  Filled 2015-05-07: qty 2.5

## 2015-05-07 MED ORDER — HEPARIN SODIUM (PORCINE) 5000 UNIT/ML IJ SOLN: 5000.0000 [IU] | Freq: Three times a day (TID) | INTRAMUSCULAR | Status: DC

## 2015-05-07 MED ORDER — MORPHINE SULFATE (PF) 2 MG/ML IV SOLN: 1.0000 mg | INTRAVENOUS | Status: DC | PRN

## 2015-05-07 MED ORDER — AMLODIPINE BESYLATE 10 MG PO TABS
10.0000 mg | ORAL_TABLET | Freq: Every day | ORAL | Status: DC
  Administered 2015-05-08 – 2015-05-09 (×2): 10 mg via ORAL
  Filled 2015-05-07 (×3): qty 1

## 2015-05-07 MED ORDER — HEPARIN SODIUM (PORCINE) 5000 UNIT/ML IJ SOLN
5000.0000 [IU] | Freq: Three times a day (TID) | INTRAMUSCULAR | Status: DC
  Administered 2015-05-07 – 2015-05-10 (×8): 5000 [IU] via SUBCUTANEOUS
  Filled 2015-05-07 (×8): qty 1

## 2015-05-07 MED ORDER — ONDANSETRON HCL 4 MG/2ML IJ SOLN
4.0000 mg | Freq: Four times a day (QID) | INTRAMUSCULAR | Status: DC | PRN
  Administered 2015-05-08 (×2): 4 mg via INTRAVENOUS
  Filled 2015-05-07 (×2): qty 2

## 2015-05-07 MED ORDER — ALENDRONATE SODIUM 70 MG PO TABS: 70.0000 mg | ORAL_TABLET | ORAL | Status: DC

## 2015-05-07 MED ORDER — FUROSEMIDE 10 MG/ML IJ SOLN
20.0000 mg | Freq: Once | INTRAMUSCULAR | Status: AC
  Administered 2015-05-07: 20 mg via INTRAVENOUS
  Filled 2015-05-07: qty 2

## 2015-05-07 MED ORDER — ACETAMINOPHEN 325 MG PO TABS: 650.0000 mg | ORAL_TABLET | Freq: Four times a day (QID) | ORAL | Status: DC | PRN

## 2015-05-07 MED ORDER — HYDROCODONE-ACETAMINOPHEN 5-325 MG PO TABS: 1.0000 | ORAL_TABLET | ORAL | Status: DC | PRN

## 2015-05-07 MED ORDER — ONDANSETRON HCL 4 MG PO TABS
4.0000 mg | ORAL_TABLET | Freq: Four times a day (QID) | ORAL | Status: DC | PRN
  Administered 2015-05-08: 4 mg via ORAL
  Filled 2015-05-07: qty 1

## 2015-05-07 MED ORDER — MAGNESIUM CITRATE PO SOLN: 1.0000 | Freq: Once | ORAL | Status: DC | PRN

## 2015-05-07 MED ORDER — HEPARIN (PORCINE) IN NACL 100-0.45 UNIT/ML-% IJ SOLN
1150.0000 [IU]/h | INTRAMUSCULAR | Status: DC
  Filled 2015-05-07: qty 250

## 2015-05-07 MED ORDER — TAMSULOSIN HCL 0.4 MG PO CAPS
0.4000 mg | ORAL_CAPSULE | Freq: Every day | ORAL | Status: DC
  Administered 2015-05-07 – 2015-05-09 (×2): 0.4 mg via ORAL
  Filled 2015-05-07 (×2): qty 1

## 2015-05-07 MED ORDER — HEPARIN BOLUS VIA INFUSION
4000.0000 [IU] | Freq: Once | INTRAVENOUS | Status: DC
  Filled 2015-05-07: qty 4000

## 2015-05-07 MED ORDER — MIRABEGRON ER 25 MG PO TB24
25.0000 mg | ORAL_TABLET | Freq: Every day | ORAL | Status: DC
  Administered 2015-05-08 – 2015-05-10 (×3): 25 mg via ORAL
  Filled 2015-05-07 (×6): qty 1

## 2015-05-07 MED ORDER — NITROFURANTOIN MONOHYD MACRO 100 MG PO CAPS
100.0000 mg | ORAL_CAPSULE | Freq: Two times a day (BID) | ORAL | Status: DC
  Administered 2015-05-07 – 2015-05-08 (×2): 100 mg via ORAL
  Filled 2015-05-07 (×7): qty 1

## 2015-05-07 MED ORDER — BISACODYL 10 MG RE SUPP
10.0000 mg | Freq: Every day | RECTAL | Status: DC | PRN
  Administered 2015-05-10: 10 mg via RECTAL
  Filled 2015-05-07: qty 1

## 2015-05-07 MED ORDER — LEVOTHYROXINE SODIUM 25 MCG PO TABS
25.0000 ug | ORAL_TABLET | Freq: Every day | ORAL | Status: DC
  Administered 2015-05-07 – 2015-05-09 (×3): 25 ug via ORAL
  Filled 2015-05-07 (×3): qty 1

## 2015-05-07 NOTE — ED Notes (Signed)
Brought in from home via EMS for c/o chills, weakness, and n/v for 3 days.  Also c/o diarrhea per granddaughter chronic for the last 2 years.  Being treated for UTI.  Has chronic foley for urinary retention.

## 2015-05-07 NOTE — Progress Notes (Signed)
Patient/family declined bed alarm. Family states they will be present at all times. Alerted family to call nursing staff if assistance is needed.

## 2015-05-07 NOTE — ED Provider Notes (Signed)
CSN: [id number]     Arrival date & time 05/07/15  0505 History   First MD Initiated Contact with Patient 05/07/15 [phone]     Chief Complaint  Patient presents with  . Weakness  . Nausea  . Emesis     (Consider location/radiation/quality/duration/timing/severity/associated sxs/prior Treatment) HPI Comments: Patient presents to the emergency department for evaluation of generalized weakness, chills, nausea, vomiting and diarrhea. Patient has not been feeling well for the last 3 days. He has been weak and sleeping all day. Overnight he developed nausea, vomiting and diarrhea. He is not expressing any significant abdominal pain. He does not feel short of breath, but granddaughter reports that she has noticed that his breathing seems to be more labored than usual.  Patient is a 82 y.o. male presenting with weakness and vomiting.  Weakness  Emesis Associated symptoms: chills and diarrhea     Past Medical History  Diagnosis Date  . Hypertension   . Seizure disorder (HCC)   . Glaucoma   . Hyperlipidemia   . Osteopenia   . Compression fracture of spine (HCC)     T 10 and T11  . Gout   . OSA (obstructive sleep apnea)     on CPAP therapy  . Hypothyroid   . Inguinal hernia   . Hemorrhoid   . Chronic systolic dysfunction of left ventricle     EF 30-35% by echo 10/10/2009  . Major depression (HCC)   . History of GI bleed     from coumadin  . MR (mitral regurgitation)   . Hearing loss     bilateral -hearing aids  . Dizziness     intermittent  . Kidney stone   . CKD (chronic kidney disease), stage III   . Permanent atrial fibrillation (HCC)     refuses coumadin due to history of GI bleed  . Decreased appetite 03/02/12  . Weight loss 03/02/12  . Complete heart block (HCC)   . CHF (congestive heart failure) (HCC)   . Seizures (HCC)   . Glaucoma   . Stroke [facility])    Past Surgical History  Procedure Laterality Date  . Transurethral resection of prostate    . Cardiac catheterization   2002  . Pacemaker insertion  07/28/06    VVI pacemaker for complete heart block by Dr Amil Amen  . Inguinal hernia repair    . Hemorrhoid surgery    . Colonoscopy with propofol N/A 05/01/2014    Procedure: COLONOSCOPY WITH PROPOFOL;  Surgeon: [name], MD;  Location: [facility] ENDOSCOPY;  Service: Endoscopy;  Laterality: N/A;  . Endarterectomy Right 10/23/2014    Procedure: ENDARTERECTOMY CAROTID;  Surgeon: Larina Earthly, MD;  [facility];  Service: Vascular;  Laterality: Right;  . Ep implantable device N/A 12/26/2014    Procedure: PPM Generator Changeout;  Surgeon: Marinus Maw, MD;  Location: [facility] INVASIVE CV LAB;  Service: Cardiovascular;  Laterality: N/A;  . Transurethral resection of prostate N/A 04/05/2015    Procedure: TRANSURETHRAL RESECTION OF THE PROSTATE (TURP);  Surgeon: Jethro Bolus, MD;  Location: [facility] ORS;  Service: Urology;  Laterality: N/A;  . Insertion of suprapubic catheter N/A 04/05/2015    Procedure: INSERTION OF SUPRAPUBIC CATHETER;  Surgeon: Jethro Bolus, MD;  Location: [facility] ORS;  Service: Urology;  Laterality: N/A;   Family History  Problem Relation Age of Onset  . Heart failure Mother   . CVA Sister   . Aneurysm Brother     brain  . CVA Brother   .  Colon cancer Neg Hx   . Colon polyps Neg Hx   . Transient ischemic attack Daughter    Social History  Substance Use Topics  . Smoking status: Never Smoker   . Smokeless tobacco: Never Used  . Alcohol Use: No    Review of Systems  Constitutional: Positive for chills.  Gastrointestinal: Positive for nausea, vomiting and diarrhea.  Neurological: Positive for weakness.  All other systems reviewed and are negative.  patient presents to the emergency department for evaluation    Allergies  Coumadin; Warfarin; and Lisinopril  Home Medications   Prior to Admission medications   Medication Sig Start Date End Date Taking? Authorizing Provider  acetaminophen (TYLENOL) 500 MG tablet Take 1 tablet (500 mg  total) by mouth every 8 (eight) hours as needed (pain). 11/09/14  Yes Albertine Grates, MD  alendronate (FOSAMAX) 70 MG tablet Take 70 mg by mouth every Monday.  02/05/14  Yes Historical Provider, MD  amLODipine (NORVASC) 10 MG tablet Take 10 mg by mouth at bedtime.    Yes Historical Provider, MD  aspirin 81 MG tablet Take 81 mg by mouth daily.   Yes Historical Provider, MD  cetirizine (ZYRTEC) 10 MG tablet Take 10 mg by mouth daily.   Yes Historical Provider, MD  colchicine 0.6 MG tablet Take 0.6 mg by mouth 2 (two) times daily as needed (gout).   Yes Historical Provider, MD  Dextromethorphan-Guaifenesin (CORICIDIN HBP CONGESTION/COUGH) 10-200 MG CAPS Take 1 capsule by mouth daily as needed (for cold). Reported on 04/13/2015   Yes Historical Provider, MD  dorzolamide-timolol (COSOPT) 22.3-6.8 MG/ML ophthalmic solution Place 1 drop into both eyes 2 (two) times daily. 09/09/14  Yes Historical Provider, MD  famotidine (PEPCID AC) 10 MG chewable tablet Chew 10 mg by mouth daily as needed for heartburn.  07/18/04  Yes Historical Provider, MD  finasteride (PROSCAR) 5 MG tablet TAKE 1 TABLET (5 MG TOTAL) BY MOUTH DAILY. 03/29/15  Yes Historical Provider, MD  fluticasone (FLONASE) 50 MCG/ACT nasal spray USE 2 SPRAYS IN EACH NOSTRIL ONCE A DAY Patient taking differently: USE 2 SPRAYS IN EACH NOSTRIL ONCE A DAY AS NEEDED FOR CONGESTION 04/06/15  Yes Doe-Hyun R Artist Pais, DO  furosemide (LASIX) 20 MG tablet Take 1 tablet (20 mg total) by mouth daily. 03/21/15  Yes Lyn Records, MD  hyoscyamine (LEVSIN/SL) 0.125 MG SL tablet Place 1 tablet (0.125 mg total) under the tongue every 4 (four) hours as needed for cramping. 04/06/15  Yes Jethro Bolus, MD  latanoprost (XALATAN) 0.005 % ophthalmic solution Place 1 drop into both eyes at bedtime.  02/05/14  Yes Historical Provider, MD  levETIRAcetam (KEPPRA) 500 MG tablet Take 1 tablet (500 mg total) by mouth 2 (two) times daily. Take 1/2 tablet (250 mg) by mouth every morning and 1 tablet (500  mg) every night Patient taking differently: Take 250-500 mg by mouth See admin instructions. Take 1/2 tablet (250 mg) by mouth every morning and 1 tablet (500 mg) every night 01/03/15  Yes Marvel Plan, MD  levothyroxine (SYNTHROID, LEVOTHROID) 25 MCG tablet TAKE 1 TABLET (25 MCG TOTAL) BY MOUTH DAILY BEFORE BREAKFAST. 03/23/15  Yes Doe-Hyun R Artist Pais, DO  mirabegron ER (MYRBETRIQ) 25 MG TB24 tablet Take 25 mg by mouth daily.   Yes Historical Provider, MD  Multiple Vitamin (MULTIVITAMIN WITH MINERALS) TABS tablet Take 1 tablet by mouth daily.   Yes Historical Provider, MD  nitrofurantoin, macrocrystal-monohydrate, (MACROBID) 100 MG capsule Take 100 mg by mouth 2 (two) times daily.  Yes Historical Provider, MD  tamsulosin (FLOMAX) 0.4 MG CAPS capsule Take 1 capsule by 03/20/15  Yes Historical Provider, MD  traMADol-acetaminophen (ULTRACET) 37.5-325 MG tablet Take 1 tablet by mouth every 6 (six) hours as needed for severe pain. 04/16/15  Yes Jethro Bolus, MD  valsartan (DIOVAN) 320 MG tablet Take 1 tablet (320 mg total) by mouth daily with lunch. 03/23/15  Yes Lyn Records, MD  vitamin B-12 (CYANOCOBALAMIN) 1000 MCG tablet Take 1,000 mcg by mouth daily.   Yes Historical Provider, MD  zolpidem (AMBIEN) 5 MG tablet Take 5 mg by mouth at bedtime.   Yes Historical Provider, MD   BP 157/67 mmHg  Pulse 71  Temp(Src) 99.5 F (37.5 C)  Resp 25  Ht 5\' 10"  (1.778 m)  Wt 196 lb (88.905 kg)  BMI 28.12 kg/m2  SpO2 99% Physical Exam  Constitutional: He is oriented to person, place, and time. He appears well-developed and well-nourished. No distress.  HENT:  Head: Normocephalic and atraumatic.  Right Ear: Hearing normal.  Left Ear: Hearing normal.  Nose: Nose normal.  Mouth/Throat: Oropharynx is clear and moist and mucous membranes are normal.  Eyes: Conjunctivae and EOM are normal. Pupils are equal, round, and reactive to light.  Neck: Normal range of motion. Neck supple.  Cardiovascular: Regular  rhythm, S1 normal and S2 normal.  Exam reveals no gallop and no friction rub.   No murmur heard. Pulmonary/Chest: Effort normal and breath sounds normal. No respiratory distress. He exhibits no tenderness.  Abdominal: Soft. Normal appearance and bowel sounds are normal. There is no hepatosplenomegaly. There is no tenderness. There is no rebound, no guarding, no tenderness at McBurney's point and negative Murphy's sign. No hernia.  Musculoskeletal: Normal range of motion.  Neurological: He is alert and oriented to person, place, and time. He has normal strength. No cranial nerve deficit or sensory deficit. Coordination normal. GCS eye subscore is 4. GCS verbal subscore is 5. GCS motor subscore is 6.  Skin: Skin is warm, dry and intact. No rash noted. No cyanosis.  Psychiatric: He has a normal mood and affect. His speech is normal and behavior is normal. Thought content normal.  Nursing note and vitals reviewed.   ED Course  Procedures (including critical care time) Labs Review Labs Reviewed  COMPREHENSIVE METABOLIC PANEL - Abnormal; Notable for the following:    Sodium 132 (*)    Chloride 99 (*)    CO2 21 (*)    Glucose, Bld 121 (*)    Creatinine, Ser 1.42 (*)    Calcium 8.6 (*)    Albumin 3.2 (*)    ALT 12 (*)    GFR calc non Af Amer 43 (*)    GFR calc Af Amer 49 (*)    All other components within normal limits  CBC - Abnormal; Notable for the following:    RBC 3.74 (*)    Hemoglobin 9.8 (*)    HCT 29.7 (*)    All other components within normal limits  URINALYSIS, ROUTINE W REFLEX MICROSCOPIC (NOT AT [facility]) - Abnormal; Notable for the following:    APPearance CLOUDY (*)    Hgb urine dipstick LARGE (*)    Leukocytes, UA LARGE (*)    All other components within normal limits  URINE MICROSCOPIC-ADD ON - Abnormal; Notable for the following:    Squamous Epithelial / LPF 0-5 (*)    Bacteria, UA RARE (*)    All other components within normal limits  TROPONIN I - Abnormal;  Notable for  the following:    Troponin I 0.06 (*)    All other components within normal limits  BRAIN NATRIURETIC PEPTIDE - Abnormal; Notable for the following:    B Natriuretic Peptide 261.9 (*)    All other components within normal limits  CULTURE, BLOOD (ROUTINE X 2)  CULTURE, BLOOD (ROUTINE X 2)  URINE CULTURE  C DIFFICILE QUICK SCREEN W PCR REFLEX  LIPASE, BLOOD    Imaging Review Dg Abd Acute W/chest  05/07/2015  CLINICAL DATA:  Acute onset of vomiting.  Initial encounter. EXAM: DG ABDOMEN ACUTE W/ 1V CHEST COMPARISON:  Chest radiograph from 11/08/2014 FINDINGS: The lungs are well-aerated. Small bilateral pleural effusions are noted. Vascular congestion is seen, with mildly increased interstitial markings, concerning for mild interstitial edema. There is no evidence of pneumothorax. A small calcified granuloma is noted near the right lung apex. The cardiomediastinal silhouette is mildly enlarged. A pacemaker is noted overlying the left chest wall, with a single lead ending overlying the right ventricle. The visualized bowel gas pattern is unremarkable. Scattered stool and air are seen within the colon; there is no evidence of small bowel dilatation to suggest obstruction. No free intra-abdominal air is identified on the provided upright view. No acute osseous abnormalities are seen; the sacroiliac joints are unremarkable in appearance. Mild degenerative change is noted along the lower lumbar spine. IMPRESSION: 1. Unremarkable bowel gas pattern; no free intra-abdominal air seen. Large amount of stool noted in the colon, raising concern for constipation. 2. Small bilateral pleural effusions noted. Vascular congestion and mild cardiomegaly. Mildly increased interstitial markings raise concern for mild interstitial edema. Electronically Signed   By: Roanna RaiderJeffery  Chang M.D.   On: 05/07/2015 06:11   I have personally reviewed and evaluated these images and lab results as part of my medical decision-making.   EKG  Interpretation   Date/Time:  Monday May 07 2015 06:36:23 EDT Ventricular Rate:  78 PR Interval:    QRS Duration: 150 QT Interval:  431 QTC Calculation: 491 R Axis:   -87 Text Interpretation:  Atrial fibrillation Left bundle branch block No  significant change since last tracing Confirmed by POLLINA  MD,  CHRISTOPHER [phone]([id number]) on 05/07/2015 6:41:51 AM      MDM   Final diagnoses:  None    Patient presented to the ER with granddaughter for evaluation of generalized weakness. He has not been feeling well for the last several days. He has been experiencing chills intermittently, has not documented any fever. He did have vomiting overnight. He has had diarrhea as well, but this is somewhat of a chronic issue for him.  Was recently treated for urinary tract infection. Organism was sensitive to the Macrobid that he is on. His urinalysis does appear improved, do not suspect that he is experiencing chills secondary to worsening urinary tract infection. With the GI symptoms, viral illness and influenza are certainly possible.  Patient was noted to be tachypneic at arrival, however. Granddaughter reports that over the last couple of days she has noticed that he has had labored breathing. He denies any significant cough, however. Chest x-ray is suspicious for interstitial edema. Troponin and BNP were added on and both are slightly elevated. Will give dose of Lasix IV here in the ER and will admit for further management.    Gilda Creasehristopher J Pollina, MD 05/07/15 [phone]

## 2015-05-07 NOTE — Evaluation (Signed)
Clinical/Bedside Swallow Evaluation Patient Details  Name: Oscar Santiago MRN: [id number] Date of Birth: Feb 09, 1925  Today's Date: 05/07/2015 Time: SLP Start Time (ACUTE ONLY): 1430 SLP Stop Time (ACUTE ONLY): 1443 SLP Time Calculation (min) (ACUTE ONLY): 13 min  Past Medical History:  Past Medical History  Diagnosis Date  . Hypertension   . Seizure disorder (HCC)   . Glaucoma   . Hyperlipidemia   . Osteopenia   . Compression fracture of spine (HCC)     T 10 and T11  . Gout   . OSA (obstructive sleep apnea)     on CPAP therapy  . Hypothyroid   . Inguinal hernia   . Hemorrhoid   . Chronic systolic dysfunction of left ventricle     EF 30-35% by echo 10/10/2009  . Major depression (HCC)   . History of GI bleed     from coumadin  . MR (mitral regurgitation)   . Hearing loss     bilateral -hearing aids  . Dizziness     intermittent  . Kidney stone   . CKD (chronic kidney disease), stage III   . Permanent atrial fibrillation (HCC)     refuses coumadin due to history of GI bleed  . Decreased appetite 03/02/12  . Weight loss 03/02/12  . Complete heart block (HCC)   . CHF (congestive heart failure) (HCC)   . Seizures (HCC)   . Glaucoma   . Stroke [facility])    Past Surgical History:  Past Surgical History  Procedure Laterality Date  . Transurethral resection of prostate    . Cardiac catheterization  2002  . Pacemaker insertion  07/28/06    VVI pacemaker for complete heart block by Dr Amil Amen  . Inguinal hernia repair    . Hemorrhoid surgery    . Colonoscopy with propofol N/A 05/01/2014    Procedure: COLONOSCOPY WITH PROPOFOL;  Surgeon: [name], MD;  Location: [facility] ENDOSCOPY;  Service: Endoscopy;  Laterality: N/A;  . Endarterectomy Right 10/23/2014    Procedure: ENDARTERECTOMY CAROTID;  Surgeon: Larina Earthly, MD;  Location: [facility] OR;  Service: Vascular;  Laterality: Right;  . Ep implantable device N/A 12/26/2014    Procedure: PPM Generator Changeout;  Surgeon: Marinus Maw,  MD;  Location: [facility] INVASIVE CV LAB;  Service: Cardiovascular;  Laterality: N/A;  . Transurethral resection of prostate N/A 04/05/2015    Procedure: TRANSURETHRAL RESECTION OF THE PROSTATE (TURP);  Surgeon: Jethro Bolus, MD;  Location: [facility] ORS;  Service: Urology;  Laterality: N/A;  . Insertion of suprapubic catheter N/A 04/05/2015    Procedure: INSERTION OF SUPRAPUBIC CATHETER;  Surgeon: Jethro Bolus, MD;  Location: [facility] ORS;  Service: Urology;  Laterality: N/A;   HPI:  82 y.o. male presented to ED for evaluation of weakness, N/V, diarrhea.  BS clear; no neuro deficits. Recently treated for UTI. Prior swallow evaluation 11/2014 admission with normal findings.  Granddaughter describes coughing with meals, usually near end of meal.   Assessment / Plan / Recommendation Clinical Impression  Pt presents with functional oropharyngeal swallow marked by adequate mastication, brisk swallow response, and no s/s of aspiration.  No deficits noted. Pt is currently on clear liquids - may be advanced to regular solids, thin liquids when permitted per MD>  No SLP f/u required - will sign off.     Aspiration Risk  No limitations    Diet Recommendation     Medication Administration: Whole meds with liquid    Other  Recommendations Oral Care Recommendations:  Oral care BID   Follow up Recommendations  None    Frequency and Duration            Prognosis        Swallow Study   General Date of Onset: 05/07/15 HPI: 82 y.o. male presented to ED for evaluation of weakness, N/V, diarrhea.  BS clear; no neuro deficits. Recently treated for UTI. Prior swallow evaluation 11/2014 admission with normal findings.  Granddaughter describes coughing with meals, usually near Type of Study: Bedside Swallow Evaluation Previous Swallow Assessment: October 2016 normal findings Diet Prior to this Study:  (clears) Temperature Spikes Noted: Yes Respiratory Status: Room air (lungs clear) History of Recent Intubation:  No Behavior/Cognition: Alert;Cooperative;Pleasant mood Oral Cavity Assessment: Within Functional Limits Oral Care Completed by SLP: No Oral Cavity - Dentition: Adequate natural dentition Vision: Functional for self-feeding Self-Feeding Abilities: Able to feed self Patient Positioning: Upright in bed Baseline Vocal Quality: Normal Volitional Cough: Strong Volitional Swallow: Able to elicit    Oral/Motor/Sensory Function Overall Oral Motor/Sensory Function: Within functional limits   Ice Chips Ice chips: Not tested   Thin Liquid Thin Liquid: Within functional limits Presentation: Cup;Straw    Nectar Thick Nectar Thick Liquid: Not tested   Honey Thick Honey Thick Liquid: Not tested   Puree Puree: Within functional limits Presentation: Self Fed;Spoon   Solid   GO   Solid: Within functional limits Presentation: Self Fed        [name], [name] 05/07/2015,2:47 PM

## 2015-05-07 NOTE — Telephone Encounter (Signed)
[facility] Night - Client TELEPHONE ADVICE RECORD [facility] Patient Name: Oscar Santiago Gender: Male DOB: 1925/08/29 Age: 82 Y 1 M 23 D Return Phone Number: [phone] (Primary) Address: City/State/Zip: [place] Client [facility] Night - Client Client Site [facility] - Night Physician Evelena Peat Contact Type Call Who Is Calling Patient / Member / Family / Caregiver Call Type Triage / Clinical Caller Name Valora Piccolo Relationship To Patient Grandchild Return Phone Number [phone] (Primary) Chief Complaint Heart palpitations or irregular heartbeat Reason for Call Symptomatic / Request for Health Information Initial Comment Caller states her grandfather had a turk procedure for a uti. Now he has been feeling tired, yucky. His b/p is okay, but his heart rate is 60 and he has a pacemaker set for 64. PreDisposition Did not know what to do Translation No Nurse Assessment Nurse: Ladona Ridgel, RN, Felicia Date/Time (Eastern Time): 05/05/2015 7:01:02 PM Confirm and document reason for call. If symptomatic, describe symptoms. You must click the next button to save text entered. ---Pt had TURP 3/2. Bad UTI diagnosed Fri 3/24. They put him on cephalexin and Monday they changed it to macrobid. 2 ER visits -Mon and Weds for cath blocking up. He has been on macrobid since Mon. He is having diarrhea. Bad stomach pain. On mybetrick for bladder spasms on 24th and he has felt worse every day. No fever. Has a cath. He is weaker since yesterday afternoon. Has the patient traveled out of the country within the last 30 days? ---No Does the patient have any new or worsening symptoms? ---Yes Will a triage be completed? ---Yes Related visit to physician within the last 2 weeks? ---Yes Does the PT have any chronic conditions? (i.e. diabetes, asthma, etc.) ---Yes List chronic conditions. ---pacemaker. 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 ---No PLEASE NOTE: All timestamps contained within this report are represented as [place] Standard Time. CONFIDENTIALTY NOTICE: This fax transmission is intended only for the addressee. It contains information that is legally privileged, confidential or otherwise protected from use or disclosure. If you are not the intended recipient, you are strictly prohibited from reviewing, disclosing, copying using or disseminating any of this information or taking any action in reliance on or regarding this information. If you have received this fax in error, please notify us immediately by telephone so that we can arrange for its return to [place]. Phone: [phone], Toll-Free: [phone], Fax: [phone] Page: 2 of 2 Call Id: [id number] Guidelines Guideline Title Affirmed Question Affirmed Notes Nurse Date/Time Lamount Cohen Time) Breathing Difficulty Recent illness requiring prolonged bedrest (i.e., immobilization) Ladona Ridgel, RN, Sunny Schlein 05/05/2015 7:08:17 PM Disp. Time Lamount Cohen Time) Disposition Final User 05/05/2015 7:17:42 PM Call Completed Ladona Ridgel RN, Sunny Schlein 05/05/2015 7:15:06 PM Go to ED Now Yes Ladona Ridgel, RN, Lavena Stanford Understands: Yes Disagree/Comply: Comply Care Advice Given Per Guideline GO TO ED NOW: You need to be seen in the Emergency Department. Go to the ER at ___________ Hospital. Leave now. Drive carefully. * If immediate transportation is not available via car or taxi, then the patient should be instructed to call [phone]. * Please bring a list of your current medicines when you go to the Emergency Department (ER). CALL EMS 911 IF: you become worse. * Another adult should drive. Comments User: Hampton Abbot, RN Date/Time Lamount Cohen Time): 05/05/2015 7:15:34 PM cough for 2 d that is mild User: Hampton Abbot, RN Date/Time Lamount Cohen Time): 05/05/2015 7:16:22 PM  also suggested they touch base with urologist who did surgery  and she is going to User: Hampton AbbotFelicia, Gaddy, RN Date/Time (Eastern Time): 05/05/2015 7:17:35 PM he can stand and using walker - but SOB with walking - seems weaker than before. Cough for 2 d that is mild Referrals GO TO FACILITY UNDECIDED GO TO FACILITY UNDECIDED

## 2015-05-07 NOTE — H&P (Signed)
[facility] History and Physical  Oscar Santiago ZOX:[id number] DOB: [birth year] DOA: 05/07/2015  Referring physician:  EDP PCP: [name], DO   Chief Complaint: Nausea, deconditioning  HPI: Oscar Santiago is a 82 y.o. male recently discharged from hospital for the treatmetn of UTI, now presenting with chills, nausea with vomiting x1 without hematemesis. Patient has a history of chronic diarrhea, although no bowel movement is reported over the last 24 hrs. He denies any abdominal pain. He reported mild increased in respiratory effort, without wheezing. Denies subjective chills, night sweats, vision changes, or mucositis.  Denies any chest pain or palpitations. Reports  lower extremity swelling R>LLE.  Appetite is decreased. Has a history of urine retention, uses catheter.  Denies abnormal skin rashes, or neuropathy. Denies any bleeding issues such as epistaxis, hematemesis, hematuria or hematochezia. According to daughter he has been increasingly deconditioned. No confusion is reported.  At the ED, he was tachypneic. Tn and BNP were unremarkable. EKG in Afib QTC 491, nl V Rate, CXR suspicious for interstitial edema. Abdominal x ray  + for constipation. He received IV Lasix x1 at 20 mg with some improvement on his respiratory symptoms and peripheral edema.  CBC  Remarkable for chronic anemia, Hb 9.8,  CMET with Na 132 and Cr 1.42. He will be admitted for tele- obs  Review of Systems:  See HPI for significant positives   All other systems were reviewed and are negative.  Past Medical History  Diagnosis Date  . Hypertension   . Seizure disorder (HCC)   . Glaucoma   . Hyperlipidemia   . Osteopenia   . Compression fracture of spine (HCC)     T 10 and T11  . Gout   . OSA (obstructive sleep apnea)     on CPAP therapy  . Hypothyroid   . Inguinal hernia   . Hemorrhoid   . Chronic systolic dysfunction of left ventricle     EF 30-35% by echo 10/10/2009  . Major depression (HCC)   .  History of GI bleed     from coumadin  . MR (mitral regurgitation)   . Hearing loss     bilateral -hearing aids  . Dizziness     intermittent  . Kidney stone   . CKD (chronic kidney disease), stage III   . Permanent atrial fibrillation (HCC)     refuses coumadin due to history of GI bleed  . Decreased appetite 03/02/12  . Weight loss 03/02/12  . Complete heart block (HCC)   . CHF (congestive heart failure) (HCC)   . Seizures (HCC)   . Glaucoma   . Stroke [facility])    Past Surgical History  Procedure Laterality Date  . Transurethral resection of prostate    . Cardiac catheterization  2002  . Pacemaker insertion  07/28/06    VVI pacemaker for complete heart block by Dr Amil Amen  . Inguinal hernia repair    . Hemorrhoid surgery    . Colonoscopy with propofol N/A 05/01/2014    Procedure: COLONOSCOPY WITH PROPOFOL;  Surgeon: [name], MD;  Location: [facility] ENDOSCOPY;  Service: Endoscopy;  Laterality: N/A;  . Endarterectomy Right 10/23/2014    Procedure: ENDARTERECTOMY CAROTID;  Surgeon: Larina Earthly, MD;  Location: [facility] OR;  Service: Vascular;  Laterality: Right;  . Ep implantable device N/A 12/26/2014    Procedure: PPM Generator Changeout;  Surgeon: Marinus Maw, MD;  Location: [facility] INVASIVE CV LAB;  Service: Cardiovascular;  Laterality: N/A;  . Transurethral  resection of prostate N/A 04/05/2015    Procedure: TRANSURETHRAL RESECTION OF THE PROSTATE (TURP);  Surgeon: Jethro Bolus, MD;  Location: [facility] ORS;  Service: Urology;  Laterality: N/A;  . Insertion of suprapubic catheter N/A 04/05/2015    Procedure: INSERTION OF SUPRAPUBIC CATHETER;  Surgeon: Jethro Bolus, MD;  Location: [facility] ORS;  Service: Urology;  Laterality: N/A;   Social History:  reports that he has never smoked. He has never used smokeless tobacco. He reports that he does not drink alcohol or use illicit drugs.  Allergies  Allergen Reactions  . Coumadin [Warfarin Sodium] Other (See Comments)    GI Bleed   . Warfarin  Other (See Comments)    EXCESS BLDG W/COUMADIN EXCESS BLDG W/COUMADIN  . Lisinopril Cough    Family History  Problem Relation Age of Onset  . Heart failure Mother   . CVA Sister   . Aneurysm Brother     brain  . CVA Brother   . Colon cancer Neg Hx   . Colon polyps Neg Hx   . Transient ischemic attack Daughter      Prior to Admission medications   Medication Sig Start Date End Date Taking? Authorizing Provider  acetaminophen (TYLENOL) 500 MG tablet Take 1 tablet (500 mg total) by mouth every 8 (eight) hours as needed (pain). 11/09/14  Yes Albertine Grates, MD  alendronate (FOSAMAX) 70 MG tablet Take 70 mg by mouth every Monday.  02/05/14  Yes Historical Provider, MD  amLODipine (NORVASC) 10 MG tablet Take 10 mg by mouth at bedtime.    Yes Historical Provider, MD  aspirin 81 MG tablet Take 81 mg by mouth daily.   Yes Historical Provider, MD  cetirizine (ZYRTEC) 10 MG tablet Take 10 mg by mouth daily.   Yes Historical Provider, MD  colchicine 0.6 MG tablet Take 0.6 mg by mouth 2 (two) times daily as needed (gout).   Yes Historical Provider, MD  Dextromethorphan-Guaifenesin (CORICIDIN HBP CONGESTION/COUGH) 10-200 MG CAPS Take 1 capsule by mouth daily as needed (for cold). Reported on 04/13/2015   Yes Historical Provider, MD  dorzolamide-timolol (COSOPT) 22.3-6.8 MG/ML ophthalmic solution Place 1 drop into both eyes 2 (two) times daily. 09/09/14  Yes Historical Provider, MD  famotidine (PEPCID AC) 10 MG chewable tablet Chew 10 mg by mouth daily as needed for heartburn.  07/18/04  Yes Historical Provider, MD  finasteride (PROSCAR) 5 MG tablet TAKE 1 TABLET (5 MG TOTAL) BY MOUTH DAILY. 03/29/15  Yes Historical Provider, MD  fluticasone (FLONASE) 50 MCG/ACT nasal spray USE 2 SPRAYS IN EACH NOSTRIL ONCE A DAY Patient taking differently: USE 2 SPRAYS IN EACH NOSTRIL ONCE A DAY AS NEEDED FOR CONGESTION 04/06/15  Yes Doe-Hyun R Artist Pais, DO  furosemide (LASIX) 20 MG tablet Take 1 tablet (20 mg total) by mouth daily.  03/21/15  Yes Lyn Records, MD  hyoscyamine (LEVSIN/SL) 0.125 MG SL tablet Place 1 tablet (0.125 mg total) under the tongue every 4 (four) hours as needed for cramping. 04/06/15  Yes Jethro Bolus, MD  latanoprost (XALATAN) 0.005 % ophthalmic solution Place 1 drop into both eyes at bedtime.  02/05/14  Yes Historical Provider, MD  levETIRAcetam (KEPPRA) 500 MG tablet Take 1 tablet (500 mg total) by mouth 2 (two) times daily. Take 1/2 tablet (250 mg) by mouth every morning and 1 tablet (500 mg) every night Patient taking differently: Take 250-500 mg by mouth See admin instructions. Take 1/2 tablet (250 mg) by mouth every morning and 1 tablet (500 mg) every  night 01/03/15  Yes Marvel PlanJindong Xu, MD  levothyroxine (SYNTHROID, LEVOTHROID) 25 MCG tablet TAKE 1 TABLET (25 MCG TOTAL) BY MOUTH DAILY BEFORE BREAKFAST. 03/23/15  Yes Doe-Hyun R Artist PaisYoo, DO  mirabegron ER (MYRBETRIQ) 25 MG TB24 tablet Take 25 mg by mouth daily.   Yes Historical Provider, MD  Multiple Vitamin (MULTIVITAMIN WITH MINERALS) TABS tablet Take 1 tablet by mouth daily.   Yes Historical Provider, MD  nitrofurantoin, macrocrystal-monohydrate, (MACROBID) 100 MG capsule Take 100 mg by mouth 2 (two) times daily.   Yes Historical Provider, MD  tamsulosin (FLOMAX) 0.4 MG CAPS capsule Take 1 capsule by 03/20/15  Yes Historical Provider, MD  traMADol-acetaminophen (ULTRACET) 37.5-325 MG tablet Take 1 tablet by mouth every 6 (six) hours as needed for severe pain. 04/16/15  Yes Jethro BolusSigmund Tannenbaum, MD  valsartan (DIOVAN) 320 MG tablet Take 1 tablet (320 mg total) by mouth daily with lunch. 03/23/15  Yes Lyn RecordsHenry W Smith, MD  vitamin B-12 (CYANOCOBALAMIN) 1000 MCG tablet Take 1,000 mcg by mouth daily.   Yes Historical Provider, MD  zolpidem (AMBIEN) 5 MG tablet Take 5 mg by mouth at bedtime.   Yes Historical Provider, MD   Physical Exam: Filed Vitals:   05/07/15 0745 05/07/15 0800 05/07/15 0815 05/07/15 0830  BP: 151/61 147/64 147/64 152/65  Pulse:  68    Temp:       Resp: 18 24 22 23   Height:      Weight:      SpO2: 95% 97% 96% 98%    Wt Readings from Last 3 Encounters:  05/07/15 88.905 kg (196 lb)  04/13/15 89.268 kg (196 lb 12.8 oz)  04/05/15 77.111 kg (170 lb)    General: Appears calm and comfortable Eyes:  PERRL, EOMI, normal lids, iris, bilateral arcus senilis ENT: grossly normal hearing, lips & tongue Neck: no lymphadenopathy, masses or thyromegaly Cardiovascular: irrregularly regular rate and rythm, 2/6 murmurs, rubs or gallops. Puffy RLE, no edema on LLE  Respiratory: decreased breath sounds at the bases, no wheezing, rhonhci, minimal rales. Normal respiratory effort. Abdomen: soft,, mildly tender at the suprapubic area, catheter present with clear urine normal bowel sounds Skin: no rash or induration seen on limited exam. No open lesions. Musculoskeletal:  grossly normal tone in both upper and lower extremities Psychiatric: grossly normal mood and affect, speech fluent and appropriate Neurologic: CN 2-12 grossly intact, moves all extremities in coordinated fashion.          Labs on Admission:  Basic Metabolic Panel:  Recent Labs Lab 05/07/15 0530  NA 132*  K 3.7  CL 99*  CO2 21*  GLUCOSE 121*  BUN 15  CREATININE 1.42*  CALCIUM 8.6*    Liver Function Tests:  Recent Labs Lab 05/07/15 0530  AST 34  ALT 12*  ALKPHOS 72  BILITOT 0.7  PROT 6.9  ALBUMIN 3.2*    Recent Labs Lab 05/07/15 0530  LIPASE 25   No results for input(s): AMMONIA in the last 168 hours.  CBC:  Recent Labs Lab 05/07/15 0530  WBC 8.0  HGB 9.8*  HCT 29.7*  MCV 79.4  PLT 226    Cardiac Enzymes:  Recent Labs Lab 05/07/15 0530  TROPONINI 0.06*    BNP (last 3 results)  Recent Labs  05/07/15 0530  BNP 261.9*    ProBNP (last 3 results) No results for input(s): PROBNP in the last 8760 hours.   CREATININE: 1.42 mg/dL ABNORMAL (16/11/9602/03/17 [id number]) Estimated creatinine clearance - 40.4 mL/min  CBG: No results for  input(s): GLUCAP in the last 168 hours.  Radiological Exams on Admission: Dg Abd Acute W/chest  05/07/2015  CLINICAL DATA:  Acute onset of vomiting.  Initial encounter. EXAM: DG ABDOMEN ACUTE W/ 1V CHEST COMPARISON:  Chest radiograph from 11/08/2014 FINDINGS: The lungs are well-aerated. Small bilateral pleural effusions are noted. Vascular congestion is seen, with mildly increased interstitial markings, concerning for mild interstitial edema. There is no evidence of pneumothorax. A small calcified granuloma is noted near the right lung apex. The cardiomediastinal silhouette is mildly enlarged. A pacemaker is noted overlying the left chest wall, with a single lead ending overlying the right ventricle. The visualized bowel gas pattern is unremarkable. Scattered stool and air are seen within the colon; there is no evidence of small bowel dilatation to suggest obstruction. No free intra-abdominal air is identified on the provided upright view. No acute osseous abnormalities are seen; the sacroiliac joints are unremarkable in appearance. Mild degenerative change is noted along the lower lumbar spine. IMPRESSION: 1. Unremarkable bowel gas pattern; no free intra-abdominal air seen. Large amount of stool noted in the colon, raising concern for constipation. 2. Small bilateral pleural effusions noted. Vascular congestion and mild cardiomegaly. Mildly increased interstitial markings raise concern for mild interstitial edema. Electronically Signed   By: Roanna Raider M.D.   On: 05/07/2015 06:11    EKG: Independently reviewed.    Assessment/Plan Active Problems:   Chronic atrial fibrillation (CHADVASc = 5)   Hypertension   OSA (obstructive sleep apnea)   Cardiac pacemaker in situ   Chronic systolic dysfunction of left ventricle   CKD (chronic kidney disease), stage III   Seizure disorder (HCC)   Hypothyroidism   Retention of urine   Paroxysmal atrial fibrillation (HCC)   Benign prostatic hypertrophy   CHF  (congestive heart failure) (HCC)   Hyponatremia   Anemia of chronic disease   Physical deconditioning  Generalized weakness, due to recent hospitalizations, chronic medical issues and decreased oral intake. Admit for obs tele PT/OT Advance diet as tolerated    Chronic systolic CHF compensated.  Not hypoxic. last 2 D echo 10/2014 : ejection fraction was40%.BNP 261.9  -admit to telemetry  -given 20mg  IV lasix in ED Continue home dose of Lasix at 20 mg daily.    -continue home meds -monitor I/Os -daily weights -prn 02  PT/OT   Chronic Atrial Fibrillation, s/p PMP. CHADVASC5. EKG A Fib, nl rate, nl QTC  Stable. Rate controlled   Continue Norvasc, ASA  Elevated troponin, mild, likely due to CHF : not associated with CP.  Troponin 0.06 .   - Trend Trop - EKG in AM - telemetry  Chronic diarrhea, present over the last 2 hears, likely due to meds and recent hospitalizations. Abd x ray shows constipation Monitor closely, may need laxatives if no bowel movements during this hospitalization.   Probiotics   Nausea/ vomiting, likely due to above Zofran prn May need laxatives    Chronic kidney disease stage 3, baseline creatinine 1.2-1.3  Currently 1.42  -  Minimize nephrotoxins and renally dose medications Lab Results  Component Value Date   CREATININE 1.42* 05/07/2015   CREATININE 1.30* 04/06/2015   CREATININE 1.20 03/28/2015  I/Os  Repeat BMET in am   Hypertension BP 152/65 mmHg  Pulse 68  Temp(Src) 99.5 F (37.5 C)  Resp 23  Ht 5\' 10"  (1.778 m)  Wt 88.905 kg (196 lb)  BMI 28.12 kg/m2  SpO2 98% Stable. Continue home anti-hypertensive medications.  Add Hydralazine Q6 hours as  needed for SBP >160 and /or DBP >110.   Hyponatremia likely due to  Meds, chronic diarrhea   No neurological deficitis    Repeat BMET in am  Anemia of chronic disease.   Hemoglobin 9.8 on admission, no bleeding issues. No transfusion indicated at this time Recheck in am.  Hypothyroidism:  Chronic.   -Continue home Synthroid  History of seizures, no recent events. Continue Keppra  OSA Continue CPAP   History of urine retention/ BPH, recent UTI with recent hospitalization, En terococcus,  treated with Macrobid Continue Flomax and mirabegron Finish MAcrobid regimen (First dose 3/27) UA with Cx    Code Status: Full Code  DVT Prophylaxis: Heparin Family Communication: at bedside Disposition Plan: Pending Improvement. Admitted for observation in tele bed. Expected LOS 24-48 hrs    [facility] E,PA-C [facility] [email or web address] Password [email or web address]

## 2015-05-08 ENCOUNTER — Ambulatory Visit: Payer: Medicare Other | Admitting: Neurology

## 2015-05-08 DIAGNOSIS — H409 Unspecified glaucoma: Secondary | ICD-10-CM | POA: Diagnosis present

## 2015-05-08 DIAGNOSIS — R531 Weakness: Secondary | ICD-10-CM | POA: Diagnosis present

## 2015-05-08 DIAGNOSIS — Z87440 Personal history of urinary (tract) infections: Secondary | ICD-10-CM | POA: Diagnosis not present

## 2015-05-08 DIAGNOSIS — N390 Urinary tract infection, site not specified: Secondary | ICD-10-CM | POA: Diagnosis present

## 2015-05-08 DIAGNOSIS — M8580 Other specified disorders of bone density and structure, unspecified site: Secondary | ICD-10-CM | POA: Diagnosis present

## 2015-05-08 DIAGNOSIS — I482 Chronic atrial fibrillation: Secondary | ICD-10-CM | POA: Diagnosis present

## 2015-05-08 DIAGNOSIS — G40909 Epilepsy, unspecified, not intractable, without status epilepticus: Secondary | ICD-10-CM | POA: Diagnosis present

## 2015-05-08 DIAGNOSIS — N401 Enlarged prostate with lower urinary tract symptoms: Secondary | ICD-10-CM | POA: Diagnosis present

## 2015-05-08 DIAGNOSIS — M7989 Other specified soft tissue disorders: Secondary | ICD-10-CM | POA: Diagnosis present

## 2015-05-08 DIAGNOSIS — I480 Paroxysmal atrial fibrillation: Secondary | ICD-10-CM | POA: Diagnosis present

## 2015-05-08 DIAGNOSIS — R197 Diarrhea, unspecified: Secondary | ICD-10-CM | POA: Diagnosis present

## 2015-05-08 DIAGNOSIS — N400 Enlarged prostate without lower urinary tract symptoms: Secondary | ICD-10-CM | POA: Diagnosis not present

## 2015-05-08 DIAGNOSIS — R338 Other retention of urine: Secondary | ICD-10-CM | POA: Diagnosis present

## 2015-05-08 DIAGNOSIS — Z7982 Long term (current) use of aspirin: Secondary | ICD-10-CM | POA: Diagnosis not present

## 2015-05-08 DIAGNOSIS — R0682 Tachypnea, not elsewhere classified: Secondary | ICD-10-CM | POA: Diagnosis present

## 2015-05-08 DIAGNOSIS — N179 Acute kidney failure, unspecified: Secondary | ICD-10-CM | POA: Diagnosis not present

## 2015-05-08 DIAGNOSIS — Z8249 Family history of ischemic heart disease and other diseases of the circulatory system: Secondary | ICD-10-CM | POA: Diagnosis not present

## 2015-05-08 DIAGNOSIS — I5022 Chronic systolic (congestive) heart failure: Secondary | ICD-10-CM | POA: Diagnosis present

## 2015-05-08 DIAGNOSIS — K5900 Constipation, unspecified: Secondary | ICD-10-CM | POA: Diagnosis present

## 2015-05-08 DIAGNOSIS — G4733 Obstructive sleep apnea (adult) (pediatric): Secondary | ICD-10-CM | POA: Diagnosis present

## 2015-05-08 DIAGNOSIS — E039 Hypothyroidism, unspecified: Secondary | ICD-10-CM | POA: Diagnosis present

## 2015-05-08 DIAGNOSIS — Z950 Presence of cardiac pacemaker: Secondary | ICD-10-CM | POA: Diagnosis not present

## 2015-05-08 DIAGNOSIS — E785 Hyperlipidemia, unspecified: Secondary | ICD-10-CM | POA: Diagnosis present

## 2015-05-08 DIAGNOSIS — E871 Hypo-osmolality and hyponatremia: Secondary | ICD-10-CM | POA: Diagnosis present

## 2015-05-08 DIAGNOSIS — M109 Gout, unspecified: Secondary | ICD-10-CM | POA: Diagnosis present

## 2015-05-08 DIAGNOSIS — I10 Essential (primary) hypertension: Secondary | ICD-10-CM | POA: Diagnosis not present

## 2015-05-08 DIAGNOSIS — I5032 Chronic diastolic (congestive) heart failure: Secondary | ICD-10-CM | POA: Diagnosis not present

## 2015-05-08 DIAGNOSIS — Z823 Family history of stroke: Secondary | ICD-10-CM | POA: Diagnosis not present

## 2015-05-08 DIAGNOSIS — H9190 Unspecified hearing loss, unspecified ear: Secondary | ICD-10-CM | POA: Diagnosis present

## 2015-05-08 DIAGNOSIS — N183 Chronic kidney disease, stage 3 (moderate): Secondary | ICD-10-CM | POA: Diagnosis present

## 2015-05-08 DIAGNOSIS — D638 Anemia in other chronic diseases classified elsewhere: Secondary | ICD-10-CM | POA: Diagnosis present

## 2015-05-08 DIAGNOSIS — I130 Hypertensive heart and chronic kidney disease with heart failure and stage 1 through stage 4 chronic kidney disease, or unspecified chronic kidney disease: Secondary | ICD-10-CM | POA: Diagnosis present

## 2015-05-08 DIAGNOSIS — F329 Major depressive disorder, single episode, unspecified: Secondary | ICD-10-CM | POA: Diagnosis present

## 2015-05-08 DIAGNOSIS — R748 Abnormal levels of other serum enzymes: Secondary | ICD-10-CM | POA: Diagnosis present

## 2015-05-08 DIAGNOSIS — T83511A Infection and inflammatory reaction due to indwelling urethral catheter, initial encounter: Secondary | ICD-10-CM | POA: Diagnosis present

## 2015-05-08 DIAGNOSIS — I340 Nonrheumatic mitral (valve) insufficiency: Secondary | ICD-10-CM | POA: Diagnosis present

## 2015-05-08 LAB — URINE CULTURE: CULTURE: NO GROWTH

## 2015-05-08 LAB — CBC
HCT: 27.7 % — ABNORMAL LOW (ref 39.0–52.0)
HEMOGLOBIN: 9.2 g/dL — AB (ref 13.0–17.0)
MCH: 26.8 pg (ref 26.0–34.0)
MCHC: 33.2 g/dL (ref 30.0–36.0)
MCV: 80.8 fL (ref 78.0–100.0)
Platelets: 194 10*3/uL (ref 150–400)
RBC: 3.43 MIL/uL — ABNORMAL LOW (ref 4.22–5.81)
RDW: 14.9 % (ref 11.5–15.5)
WBC: 4.7 10*3/uL (ref 4.0–10.5)

## 2015-05-08 LAB — COMPREHENSIVE METABOLIC PANEL
ALBUMIN: 2.7 g/dL — AB (ref 3.5–5.0)
ALK PHOS: 60 U/L (ref 38–126)
ALT: 11 U/L — ABNORMAL LOW (ref 17–63)
ANION GAP: 13 (ref 5–15)
AST: 28 U/L (ref 15–41)
BILIRUBIN TOTAL: 0.7 mg/dL (ref 0.3–1.2)
BUN: 17 mg/dL (ref 6–20)
CALCIUM: 8.2 mg/dL — AB (ref 8.9–10.3)
CO2: 21 mmol/L — ABNORMAL LOW (ref 22–32)
Chloride: 101 mmol/L (ref 101–111)
Creatinine, Ser: 1.56 mg/dL — ABNORMAL HIGH (ref 0.61–1.24)
GFR calc Af Amer: 44 mL/min — ABNORMAL LOW (ref >60)
GFR, EST NON AFRICAN AMERICAN: 38 mL/min — AB (ref >60)
GLUCOSE: 98 mg/dL (ref 65–99)
POTASSIUM: 4 mmol/L (ref 3.5–5.1)
Sodium: 135 mmol/L (ref 135–145)
TOTAL PROTEIN: 6.2 g/dL — AB (ref 6.5–8.1)

## 2015-05-08 LAB — GLUCOSE, CAPILLARY
GLUCOSE-CAPILLARY: 131 mg/dL — AB (ref 65–99)
GLUCOSE-CAPILLARY: 96 mg/dL (ref 65–99)

## 2015-05-08 LAB — PROTIME-INR
INR: 1.28 (ref 0.00–1.49)
PROTHROMBIN TIME: 16.1 s — AB (ref 11.6–15.2)

## 2015-05-08 MED ORDER — PROMETHAZINE HCL 6.25 MG/5ML PO SYRP
12.5000 mg | ORAL_SOLUTION | Freq: Four times a day (QID) | ORAL | Status: DC | PRN
  Filled 2015-05-08: qty 10

## 2015-05-08 NOTE — Progress Notes (Signed)
Oscar Santiago ZOX:[id number] DOB: January 30, 1926 DOA: 05/07/2015 PCP: [name], DO  Brief narrative: 82 y/o ? BPH status post TURP 04/05/15 + suprapubic catheter by Dr. Patsi Sears OSA on CPAP Carotid endarterectomy right side for carotid disease 10/23/2014 Dr. Arbie Cookey History of partial seizure 10/20/2014-h/o sz disorder-neurologist at [facility] is Dr. 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 Not really eating much No chest pain at present No blurred vision or double vision no shortness of breath nor cough   Objective    Interim History:    Objective: Filed Vitals:   05/07/15 2100 05/08/15 0322 05/08/15 0449 05/08/15 1326  BP: 123/52 154/47 139/61 162/65  Pulse: 61 55 60 60  Temp:  98.9 F (37.2 C) 98.9 F (37.2 C) 99.1 F (37.3 C)  TempSrc:  Oral Oral Oral  Resp:  18  18  Height:      Weight:  79.334 kg (174 lb 14.4 oz) 83.643 kg (184 lb 6.4 oz)   SpO2: 97% 95% 97% 98%    Intake/Output Summary (Last 24 hours) at 05/08/15 1420 Last data filed at 05/08/15 1412  Gross per 24 hour  Intake   2722 ml  Output   1450 ml  Net   1272 ml    Exam:  EOMI NCAT S1-S2 no murmur rub or gallop Dry mucosa Chest clinically clear no added sound no CVA tenderness Abdomen soft nontender nondistended Indwelling Foley in situ Abdominal scar with suprapubic was placed is  well-healed No lower extremity edema No rash   Data Reviewed: Basic Metabolic Panel:  Recent Labs Lab 05/07/15 0530 05/07/15 1027 05/08/15 0503  NA 132*  --  135  K 3.7  --  4.0  CL 99*  --  101  CO2 21*  --  21*  GLUCOSE 121*  --  98  BUN 15  --  17  CREATININE 1.42* 1.39* 1.56*  CALCIUM 8.6*  --  8.2*   Liver Function Tests:  Recent Labs Lab 05/07/15 0530 05/08/15 0503  AST 34 28  ALT 12* 11*  ALKPHOS 72 60  BILITOT 0.7 0.7  PROT 6.9 6.2*  ALBUMIN 3.2* 2.7*    Recent Labs Lab 05/07/15 0530  LIPASE 25   No results for input(s): AMMONIA in the last 168 hours. CBC:  Recent Labs Lab 05/07/15 0530 05/07/15 1027 05/08/15 0503  WBC 8.0 8.3 4.7  HGB 9.8* 9.8* 9.2*  HCT 29.7* 28.9* 27.7*  MCV 79.4 79.8 80.8  PLT 226 204 194   Cardiac Enzymes:  Recent Labs Lab 05/07/15 0530  TROPONINI 0.06*   BNP: Invalid input(s): POCBNP CBG:  Recent Labs Lab 05/07/15 1713 05/08/15 1145  GLUCAP 130* 131*    Recent Results (from the past 240 hour(s))  Urine culture     Status: None   Collection Time: 04/30/15  2:08 PM  Result Value Ref Range Status   Specimen Description URINE, CATHETERIZED  Final   Special Requests Normal  Final   Culture   Final    60,000 COLONIES/ml ENTEROCOCCUS SPECIES [facility]    Report Status 05/03/2015 FINAL  Final   Organism ID, Bacteria ENTEROCOCCUS SPECIES  Final      Susceptibility   Enterococcus species - MIC*    AMPICILLIN <=2 SENSITIVE Sensitive     LEVOFLOXACIN 1 SENSITIVE Sensitive     NITROFURANTOIN <=16 SENSITIVE Sensitive     VANCOMYCIN 2 SENSITIVE Sensitive     * 60,000 COLONIES/ml ENTEROCOCCUS SPECIES  Urine culture     Status: None   Collection Time: 05/07/15  5:20 AM  Result Value Ref Range Status   Specimen Description URINE, RANDOM  Final   Special Requests NONE  Final   Culture NO GROWTH 1 DAY  Final   Report Status 05/08/2015 FINAL  Final     Studies:              All  Imaging reviewed and is as per above notation   Scheduled Meds: . amLODipine  10 mg Oral QHS  . aspirin  81 mg Oral Daily  . dorzolamide-timolol  1 drop Both Eyes BID  . finasteride  5 mg Oral Daily  . furosemide  20 mg Oral Daily  . heparin subcutaneous  5,000 Units Subcutaneous 3 times per day  . latanoprost  1 drop Both Eyes QHS  . levETIRAcetam  500 mg Oral BID  . levothyroxine  25 mcg Oral QAC breakfast  . mirabegron ER  25 mg Oral Daily  . nitrofurantoin (macrocrystal-monohydrate)  100 mg Oral BID  . tamsulosin  0.4 mg Oral QPC supper   Continuous Infusions: . sodium chloride 75 mL/hr at 05/08/15 1028     Assessment/Plan:  1. Nausea vomiting-if persists would get CT scan abdomen pelvis for now change Zofran to by mouth Phenergan. Nursing to notify if worsening symptoms. Would get CT contrast without IV contrast as renal insufficiency 2. Probable pyelonephritis versus obstructive uropathy-continue Macrobid-it is intrinsic that her urologist sees him as he has had associated urinary retention as well as slight worsening of his renal failure. Baseline BUN/creatinine/GFR is above 62 date is 44. We will ask for a bladder scan and obtain renal ultrasound if any worse in the morning-Dr. Patsi Searsannenbaum has been consulted and I left a message with his secretary to let him know that patient was admitted-would get ultrasound if renal failure worsens on 05/09/15--holding home dose Lasix 20 daily and continue saline 75 cc/h 3. OSA on CPAP-place on AutoPap tonight 4. History of carotid endarterectomy right side for carotid disease 10/2014-needs to continue aspirin 81 mg daily 5. -For now continue Mira begron ER 25 daily, Flomax 0.4 daily at bedtime, finasteride 5 daily tachybradycardia syndrome [place] score 5 permanent pacemaker placed 2008-not for battery re-change subsequently 6. History of chronic systolic heart failure EF 30-40% NYHA class II-holding Lasix as above 7. Hypertension continue amlodipine  10 daily 8. Glaucoma continue dorzolamide-timolol 1 drop twice a day 9. -Hypothyroidism continue levothyroxine 25 every morning 10. History of partial seizures [facility] [facility]-continue Keppra 500 twice a day    full CODE STATUS Discussed with daughter at the bedside Await urology input.   Pleas KochJai Salimata Christenson, MD  [facility] Pager [phone] 05/08/2015, 2:20 PM    LOS: 1 day

## 2015-05-08 NOTE — Evaluation (Signed)
Physical Therapy Evaluation Patient Details Name: Oscar Santiago MRN: [id number] DOB: 1925-07-15 Today's Date: 05/08/2015   History of Present Illness  82 y.o. male recently discharged from hospital for the treatmetn of UTI, now presenting with chills, nausea with vomiting x1 without hematemesis. PMH: chronic low back pain, CVA, seizures, atrial fibrillation, depression, CKD, CHF, pacemaker  Clinical Impression  Pt admitted with above diagnosis. Pt currently with functional limitations due to the deficits listed below (see PT Problem List).  Pt will benefit from skilled PT to increase their independence and safety with mobility to allow discharge to home with family assistance. Family reporting that they will be able to provide 24 hour supervision upon return to home. No equipment needs at this time.        Follow Up Recommendations Home health PT;Supervision/Assistance - 24 hour    Equipment Recommendations  None recommended by PT    Recommendations for Other Services       Precautions / Restrictions Precautions Precautions: Fall Restrictions Weight Bearing Restrictions: No      Mobility  Bed Mobility Overal bed mobility: Modified Independent             General bed mobility comments: use of bed rail  Transfers Overall transfer level: Needs assistance Equipment used: Rolling walker (2 wheeled) Transfers: Sit to/from Stand Sit to Stand: Min guard         General transfer comment: cues to reach for chair when sitting.   Ambulation/Gait Ambulation/Gait assistance: Min guard Ambulation Distance (Feet): 80 Feet Assistive device: Rolling walker (2 wheeled) Gait Pattern/deviations: Step-through pattern;Decreased step length - right;Decreased step length - left;Narrow base of support;Trunk flexed Gait velocity: decreased   General Gait Details: cues for posture and use of rw provided intermittantly during session. Occasional assistance provided to maneuver rw when  turning and to avoid objects.   Stairs            Wheelchair Mobility    Modified Rankin (Stroke Patients Only)       Balance Overall balance assessment: Needs assistance Sitting-balance support: No upper extremity supported Sitting balance-Leahy Scale: Good     Standing balance support: Bilateral upper extremity supported Standing balance-Leahy Scale: Poor Standing balance comment: using rw                             Pertinent Vitals/Pain Pain Assessment:  (chronic low back pain, but pt without C/O)    Home Living Family/patient expects to be discharged to:: Private residence Living Arrangements: Spouse/significant other;Children Available Help at Discharge: Family;Available 24 hours/day Type of Home: House Home Access: Ramped entrance     Home Layout: Two level;Laundry or work area in basement;Able to live on main level with bedroom/bathroom Home Equipment: Gilmer Mor - single point;Bedside commode;Walker - 2 wheels;Grab bars - tub/shower Additional Comments: Has a lift chair, but does not use; hospital bed rail    Prior Function Level of Independence: Independent with assistive device(s)   Gait / Transfers Assistance Needed: Had worked his way back to [facility] pta  ADL's / Homemaking Assistance Needed: occassionally needed A for LB dressing due to foley  Comments: states that he was using a SPC prior to admission.      Hand Dominance   Dominant Hand: Right    Extremity/Trunk Assessment   Upper Extremity Assessment: Generalized weakness           Lower Extremity Assessment: Generalized weakness  Communication   Communication: HOH  Cognition Arousal/Alertness: Awake/alert Behavior During Therapy: WFL for tasks assessed/performed Overall Cognitive Status: Within Functional Limits for tasks assessed                      General Comments      Exercises        Assessment/Plan    PT Assessment Patient needs continued  PT services  PT Diagnosis Generalized weakness;Difficulty walking   PT Problem List Decreased strength;Decreased activity tolerance;Decreased balance;Decreased mobility;Decreased knowledge of use of DME  PT Treatment Interventions DME instruction;Gait training;Functional mobility training;Therapeutic activities;Therapeutic exercise;Patient/family education   PT Goals (Current goals can be found in the Care Plan section) Acute Rehab PT Goals Patient Stated Goal: home soon PT Goal Formulation: With patient Time For Goal Achievement: 05/22/15 Potential to Achieve Goals: Good    Frequency Min 3X/week   Barriers to discharge        Co-evaluation               End of Session Equipment Utilized During Treatment: Gait belt Activity Tolerance: Patient tolerated treatment well Patient left: in chair;with call bell/phone within reach;with family/visitor present;Other (comment) (family reports they will not leave pt unattended. ) Nurse Communication: Mobility status    Functional Assessment Tool Used: clinical judgment Functional Limitation: Mobility: Walking and moving around Mobility: Walking and Moving Around Current Status (Z6109(G8978): At least 40 percent but less than 60 percent impaired, limited or restricted Mobility: Walking and Moving Around Goal Status [id number](G8979): At least 20 percent but less than 40 percent impaired, limited or restricted    Time: [id number] PT Time Calculation (min) (ACUTE ONLY): 23 min   Charges:   PT Evaluation $PT Eval Moderate Complexity: 1 Procedure PT Treatments $Gait Training: 8-22 mins   PT G Codes:   PT G-Codes **NOT FOR INPATIENT CLASS** Functional Assessment Tool Used: clinical judgment Functional Limitation: Mobility: Walking and moving around Mobility: Walking and Moving Around Current Status (N8295(G8978): At least 40 percent but less than 60 percent impaired, limited or restricted Mobility: Walking and Moving Around Goal Status [phone](G8979): At least 20  percent but less than 40 percent impaired, limited or restricted    Christiane HaBenjamin J. Nivin Braniff, PT, CSCS Pager [phone] Office [phone]  05/08/2015, 12:03 PM

## 2015-05-08 NOTE — Evaluation (Signed)
Occupational Therapy Evaluation Patient Details Name: Oscar Santiago MRN: [id number] DOB: 10-02-1925 Today's Date: 05/08/2015    History of Present Illness Patient has been brought to the hospital for progressive generalized weakness, lethargy and was found to have nausea and vomiting this morning. He suffers from chronic diarrhea for the last 2-3 years. No reported fevers or change in cough. He recently was diagnosed with a UTI and is being treated with macrobid. Repeat urinalysis shows improvement. Chest xray did not show any evidence of pneumonia..It is likely that his generalized weakness is related to deconditioning. There does not seem to be obvious source of infection and he does not appear to be particularly dehydrated.PMHx: chronic low back pain, CVA, seizures,, AFIb   Clinical Impression   This 82 yo male admitted with above presents to acute OT with deficits below thus affecting his PLOF of basically Mod I pta (except for occasional A for LB dressing due to foley catheter). He will benefit from acute OT without need for follow up OT due to he will have 24 hour S with prn post D/C per pt.    Follow Up Recommendations  No OT follow up    Equipment Recommendations  None recommended by OT    Recommendations for Other Services       Precautions / Restrictions Precautions Precautions: Fall Restrictions Weight Bearing Restrictions: No      Mobility Bed Mobility Overal bed mobility: Modified Independent             General bed mobility comments: use of bed rail  Transfers Overall transfer level: Needs assistance Equipment used: Rolling walker (2 wheeled) Transfers: Sit to/from Stand Sit to Stand: Min guard              Balance                                            ADL Overall ADL's : Needs assistance/impaired Eating/Feeding: Independent;Sitting   Grooming: Min guard;Wash/dry hands;Oral care;Standing   Upper Body Bathing: Set  up;Sitting   Lower Body Bathing: Minimal assistance (min guard A sit<>stand)   Upper Body Dressing : Set up;Sitting   Lower Body Dressing: Minimal assistance (min guard A sit<>stand)   Toilet Transfer: Min guard;Ambulation;RW;BSC (over toilet)   Toileting- Clothing Manipulation and Hygiene: Min guard;Sit to/from stand               Vision     Perception     Praxis      Pertinent Vitals/Pain Pain Assessment:  (chronic low back pain, but pt without C/O)     Hand Dominance Right   Extremity/Trunk Assessment Upper Extremity Assessment Upper Extremity Assessment: Generalized weakness           Communication Communication Communication: HOH   Cognition Arousal/Alertness: Awake/alert Behavior During Therapy: WFL for tasks assessed/performed Overall Cognitive Status: Within Functional Limits for tasks assessed                     General Comments       Exercises       Shoulder Instructions      Home Living Family/patient expects to be discharged to:: Private residence Living Arrangements: Children Available Help at Discharge: Family;Available 24 hours/day Type of Home: House Home Access: Ramped entrance     Home Layout: Two level;Laundry or work area in basement;Able to  live on main level with bedroom/bathroom Alternate Level Stairs-Number of Steps: 13 Alternate Level Stairs-Rails: Right;Left;Can reach both Bathroom Shower/Tub: Tub/shower unit;Curtain   FirefighterBathroom Toilet: Standard     Home Equipment: Cane - single point;Bedside commode;Walker - 2 wheels;Grab bars - tub/shower   Additional Comments: Has a lift chair, but does not use; hospital bed rail      Prior Functioning/Environment Level of Independence: Independent with assistive device(s)  Gait / Transfers Assistance Needed: [facility] ADL's / Homemaking Assistance Needed: occassionally needed A for LB dressing due to foley        OT Diagnosis: Generalized  weakness   OT Problem List: Decreased strength;Impaired balance (sitting and/or standing)   OT Treatment/Interventions: Self-care/ADL training;Patient/family education;Balance training;DME and/or AE instruction    OT Goals(Current goals can be found in the care plan section) Acute Rehab OT Goals Patient Stated Goal: home soon OT Goal Formulation: With patient Time For Goal Achievement: 05/15/15 Potential to Achieve Goals: Good  OT Frequency: Min 2X/week   Barriers to D/C:            Co-evaluation              End of Session Equipment Utilized During Treatment: Gait belt;Rolling walker  Activity Tolerance: Patient tolerated treatment well Patient left: in chair;with call bell/phone within reach (no chair alarm due to per chart pt's family had requested no alarms since they would be with him at all times.  Asked grand-daughter in room to please let RN know if she was leaving due to alarm not on chair)   Time: [id number] OT Time Calculation (min): 30 min Charges:  OT General Charges $OT Visit: 1 Procedure OT Evaluation $OT Eval Moderate Complexity: 1 Procedure OT Treatments $Self Care/Home Management : 8-22 mins G-Codes: OT G-codes **NOT FOR INPATIENT CLASS** Functional Assessment Tool Used: clinical observation Functional Limitation: Self care Self Care Current Status (O1308(G8987): At least 20 percent but less than 40 percent impaired, limited or restricted Self Care Goal Status (M5784(G8988): At least 1 percent but less than 20 percent impaired, limited or restricted  Evette GeorgesLeonard, Avarae Zwart Eva [phone] 05/08/2015, 12:03 PM

## 2015-05-09 DIAGNOSIS — R110 Nausea: Secondary | ICD-10-CM

## 2015-05-09 DIAGNOSIS — E039 Hypothyroidism, unspecified: Secondary | ICD-10-CM

## 2015-05-09 DIAGNOSIS — Z950 Presence of cardiac pacemaker: Secondary | ICD-10-CM

## 2015-05-09 DIAGNOSIS — I5022 Chronic systolic (congestive) heart failure: Secondary | ICD-10-CM

## 2015-05-09 LAB — RENAL FUNCTION PANEL
ALBUMIN: 2.6 g/dL — AB (ref 3.5–5.0)
Anion gap: 11 (ref 5–15)
BUN: 14 mg/dL (ref 6–20)
CO2: 23 mmol/L (ref 22–32)
CREATININE: 1.44 mg/dL — AB (ref 0.61–1.24)
Calcium: 8.1 mg/dL — ABNORMAL LOW (ref 8.9–10.3)
Chloride: 103 mmol/L (ref 101–111)
GFR, EST AFRICAN AMERICAN: 48 mL/min — AB (ref >60)
GFR, EST NON AFRICAN AMERICAN: 42 mL/min — AB (ref >60)
Glucose, Bld: 102 mg/dL — ABNORMAL HIGH (ref 65–99)
PHOSPHORUS: 3.2 mg/dL (ref 2.5–4.6)
Potassium: 3.3 mmol/L — ABNORMAL LOW (ref 3.5–5.1)
Sodium: 137 mmol/L (ref 135–145)

## 2015-05-09 LAB — CBC WITH DIFFERENTIAL/PLATELET
Basophils Absolute: 0.1 K/uL (ref 0.0–0.1)
Basophils Relative: 1 %
Eosinophils Absolute: 0.3 K/uL (ref 0.0–0.7)
Eosinophils Relative: 5 %
HCT: 27.3 % — ABNORMAL LOW (ref 39.0–52.0)
Hemoglobin: 8.7 g/dL — ABNORMAL LOW (ref 13.0–17.0)
Lymphocytes Relative: 19 %
Lymphs Abs: 1.3 K/uL (ref 0.7–4.0)
MCH: 25.7 pg — ABNORMAL LOW (ref 26.0–34.0)
MCHC: 31.9 g/dL (ref 30.0–36.0)
MCV: 80.5 fL (ref 78.0–100.0)
Monocytes Absolute: 0.6 K/uL (ref 0.1–1.0)
Monocytes Relative: 9 %
Neutro Abs: 4.3 K/uL (ref 1.7–7.7)
Neutrophils Relative %: 66 %
Platelets: 192 K/uL (ref 150–400)
RBC: 3.39 MIL/uL — ABNORMAL LOW (ref 4.22–5.81)
RDW: 14.7 % (ref 11.5–15.5)
WBC: 6.5 K/uL (ref 4.0–10.5)

## 2015-05-09 MED ORDER — SENNOSIDES-DOCUSATE SODIUM 8.6-50 MG PO TABS: 2.0000 | ORAL_TABLET | Freq: Every day | ORAL | Status: DC

## 2015-05-09 MED ORDER — AMOXICILLIN 500 MG PO CAPS
500.0000 mg | ORAL_CAPSULE | Freq: Two times a day (BID) | ORAL | Status: DC
  Administered 2015-05-09: 500 mg via ORAL
  Filled 2015-05-09: qty 1

## 2015-05-09 MED ORDER — MAGNESIUM CITRATE PO SOLN: 0.5000 | Freq: Once | ORAL | Status: DC

## 2015-05-09 MED ORDER — POLYETHYLENE GLYCOL 3350 17 G PO PACK
17.0000 g | PACK | Freq: Every day | ORAL | Status: DC
  Administered 2015-05-09: 17 g via ORAL
  Filled 2015-05-09: qty 1

## 2015-05-09 MED ORDER — LACTULOSE 10 GM/15ML PO SOLN
30.0000 g | Freq: Once | ORAL | Status: AC
  Administered 2015-05-09: 30 g via ORAL
  Filled 2015-05-09: qty 45

## 2015-05-09 NOTE — Progress Notes (Signed)
Visited pts room to set up on Cpap.. Pt has family member at bedside and both agree he dont want to wear at this time.  Advised if he changes his mind throughout the night to have RN call.  RN aware that patient refused at this time.

## 2015-05-09 NOTE — Progress Notes (Signed)
[facility]  Patient Status: Active (receiving services up to time of hospitalization)  [facility] is providing the following services: PT  Would benefit from RN services. Please order if you agree.  If patient discharges after hours, please call [phone].   Oscar FurnishDonna Santiago 05/09/2015, 10:50 AM

## 2015-05-09 NOTE — Consult Note (Signed)
   [facility] CM Inpatient Consult   05/09/2015  KAEDYN POLIVKA 10/22/1925 [id number] Patient was assessed for [facility] for community services. Patient was previously active with [facility] under his Medicare benefit.  Met with patient  And daughter at bedside regarding being restarted with [facility] services. Patient states that he would like the information to be given and discussed with his granddaughter, [place] [name]) who should be around at lunch time.   Of note, [facility] Care Management services does not replace or interfere with any services that are arranged by inpatient case management or social work. For additional questions or referrals please contact: Natividad Brood, RN BSN [facility] Liaison  [phone] business mobile phone Toll free office [phone]

## 2015-05-09 NOTE — Progress Notes (Signed)
Physical Therapy Treatment Patient Details Name: SMAYAN HACKBART MRN: [id number] DOB: 10/19/1925 Today's Date: 05/09/2015    History of Present Illness 82 y.o. male recently discharged from hospital for the treatmetn of UTI, now presenting with chills, nausea with vomiting x1 without hematemesis. PMH: chronic low back pain, CVA, seizures, atrial fibrillation, depression, CKD, CHF, pacemaker    PT Comments    Patient much improved with activity tolerance and able to perform prolonged standing tasks with fair balance and without c/o of fatigue.  Will benefit from follow up HHPT at d/c.  Follow Up Recommendations  Home health PT;Supervision/Assistance - 24 hour     Equipment Recommendations  None recommended by PT    Recommendations for Other Services       Precautions / Restrictions Precautions Precautions: Fall Restrictions Weight Bearing Restrictions: No    Mobility  Bed Mobility Overal bed mobility: Modified Independent             General bed mobility comments: use of bed rail  Transfers Overall transfer level: Needs assistance Equipment used: Rolling walker (2 wheeled) Transfers: Sit to/from Stand Sit to Stand: Min guard         General transfer comment: assist up from bed, cues for safety stand to sit onto 3:1 over toilet and to chair, but stood unaided from 3:1 over toilet  Ambulation/Gait Ambulation/Gait assistance: Min guard Ambulation Distance (Feet): 225 Feet Assistive device: Rolling walker (2 wheeled) Gait Pattern/deviations: Step-through pattern;Trunk flexed;Shuffle;Decreased stride length     General Gait Details: cues for posture, mostly cues for safety to avoid obstacles in hallway, but once or twice assisted with walker for safety, also over threshold into bathroom cues for safety   Stairs            Wheelchair Mobility    Modified Rankin (Stroke Patients Only)       Balance Overall balance assessment: Needs assistance    Sitting balance-Leahy Scale: Good     Standing balance support: No upper extremity supported;During functional activity Standing balance-Leahy Scale: Fair Standing balance comment: standing for hygiene in bathoom after toileting no UE support with supervision; also stood at sink to wash hands, wash face and brush teeth                    Cognition Arousal/Alertness: Awake/alert Behavior During Therapy: WFL for tasks assessed/performed Overall Cognitive Status: Within Functional Limits for tasks assessed                      Exercises      General Comments General comments (skin integrity, edema, etc.): overall cognition is WFL, but pt slower to respond and needs cues for each step with mobility; however performs ADL tasks automatically      Pertinent Vitals/Pain Pain Assessment: No/denies pain    Home Living                      Prior Function            PT Goals (current goals can now be found in the care plan section) Progress towards PT goals: Progressing toward goals    Frequency  Min 3X/week    PT Plan Current plan remains appropriate    Co-evaluation             End of Session Equipment Utilized During Treatment: Gait belt Activity Tolerance: Patient tolerated treatment well Patient left: in chair;with call bell/phone within reach;with family/visitor present  Time: 1210-1240 PT Time Calculation (min) (ACUTE ONLY): 30 min  Charges:  $Gait Training: 8-22 mins $Therapeutic Activity: 8-22 mins                    G Codes:      Elray McgregorCynthia Jaysean [place] 05/09/2015, 4:33 PM Sheran Lawlessyndi Sahana [place], PT [phone] 05/09/2015

## 2015-05-09 NOTE — Progress Notes (Signed)
PATIENT DETAILS Name: Oscar Santiago Age: 82 y.o. Sex: male Date of Birth: [birth year] Admit Date: 05/07/2015 Admitting Physician No admitting provider for patient encounter. WUJ:WJXBJY Artist Pais, DO  Subjective: Feels better, no longer with any vomiting. Ate breakfast this morning. Still feels weak.  Assessment/Plan: Active Problems: Nausea with vomiting: Seems to have resolved. Not sure if this was secondary to side effect from Macrobid or large stool burden. In any event, seems to have resolved,  abdomen is soft, nontender and nondistended on exam.  Abdominal x-ray on 4/3 was without any acute abnormalities except for large stool burden.  Constipation: Large stool burden seen on x-ray abdomen a few days back, had 1 BM this morning, give one dose of lactulose, followed by scheduled Senokot . Follow  Complicated UTI: Has a Foley catheter in place since TURP last month. Urine culture on 3/27 was positive for 60,000 colonies of enterococcus, has completed more than one week of therapy with Macrobid. WIll discontinue all antibiotics and monitor off antibiotics.  Mild ARF: Likely secondary to prerenal azotemia in a setting of vomiting. Doubt obstructive uropathy, as Foley is in place and draining well. Cautiously hydrate and recheck electrolytes periodically.  Chronic systolic heart failure (EF 40% on TTE September 2016): Clinically compensated.  Generalized weakness: Suspect multifactorial from recent UTI, recent prostate surgery-acute illness with nausea vomiting. PT evaluation, recommendations are for home health services on discharge.  Hypothyroidism: Continue Synthroid.  BPH: Recent TURP-40 catheter in place, continue finasteride and Flomax. Have placed a call to Dr. Imelda Pillow office, awaiting callback.  Seizure disorder: Continue Keppra.  History of paroxysmal atrial fibrillation: Reviewed outpatient cardiology note (03/14/15)-not on anticoagulation because of hematuria.  Paced rhythm, on aspirin.CHad2Vasc2 score=5  History of carotid artery stenosis:status post right carotid endarterectomy, has approximately 60-79% stenosis in the left carotid artery. Vascular surgery following patient in the outpatient setting, reviewed note-plans were to complete TURP first before  proceeding with left carotid arterectomy. Will require venovenous follow-up on discharge.  Hypertension: Controlled with amlodipine.  Disposition: Remain inpatient-home likely tomorrow if clinically improvement continues.  Antimicrobial agents  See below  Anti-infectives    Start     Dose/Rate Route Frequency Ordered Stop   05/09/15 1200  amoxicillin (AMOXIL) capsule 500 mg     500 mg Oral Every 12 hours 05/09/15 1101 05/16/15 0959      DVT Prophylaxis: Prophylactic Heparin   Code Status: Full code   Family Communication Daughter at bedside  Procedures: None  CONSULTS:  None  Time spent 30 minutes-Greater than 50% of this time was spent in counseling, explanation of diagnosis, planning of further management, and coordination of care.  MEDICATIONS: Scheduled Meds: . amLODipine  10 mg Oral QHS  . amoxicillin  500 mg Oral Q12H  . aspirin  81 mg Oral Daily  . dorzolamide-timolol  1 drop Both Eyes BID  . finasteride  5 mg Oral Daily  . heparin subcutaneous  5,000 Units Subcutaneous 3 times per day  . latanoprost  1 drop Both Eyes QHS  . levETIRAcetam  500 mg Oral BID  . levothyroxine  25 mcg Oral QAC breakfast  . mirabegron ER  25 mg Oral Daily  . tamsulosin  0.4 mg Oral QPC supper   Continuous Infusions: . sodium chloride 75 mL/hr at 05/09/15 0716   PRN Meds:.acetaminophen **OR** acetaminophen, bisacodyl, HYDROcodone-acetaminophen, magnesium citrate, morphine injection, ondansetron **OR** ondansetron (ZOFRAN) IV, promethazine, senna-docusate, traZODone  PHYSICAL EXAM: Vital signs in last 24 hours: Filed Vitals:   05/08/15 1326 05/08/15 2027 05/09/15 0505  05/09/15 1100  BP: 162/65 155/62 124/49 129/48  Pulse: 60 52 62 61  Temp: 99.1 F (37.3 C) 99.5 F (37.5 C) 98.8 F (37.1 C) 98.3 F (36.8 C)  TempSrc: Oral Oral Oral Oral  Resp: Height:      Weight:   82.872 kg (182 lb 11.2 oz)   SpO2: 98% 99% 95% 98%    Weight change: -1.928 kg (-4 lb 4 oz) Filed Weights   05/08/15 0322 05/08/15 0449 05/09/15 0505  Weight: 79.334 kg (174 lb 14.4 oz) 83.643 kg (184 lb 6.4 oz) 82.872 kg (182 lb 11.2 oz)   Body mass index is 26.21 kg/(m^2).   Gen Exam: Awake and alert with clear speech.   Neck: Supple, No JVD.   Chest: B/L Clear.   CVS: S1 S2 Regular, no murmurs.  Abdomen: soft, BS +, non tender, non distended.  Extremities: no edema, lower extremities warm to touch. Neurologic: Non Focal.   Skin: No Rash.   Wounds: N/A.    Intake/Output from previous day:  Intake/Output Summary (Last 24 hours) at 05/09/15 1356 Last data filed at 05/09/15 1142  Gross per 24 hour  Intake   1532 ml  Output    750 ml  Net    782 ml     LAB RESULTS: CBC  Recent Labs Lab 05/07/15 0530 05/07/15 1027 05/08/15 0503 05/09/15 0301  WBC 8.0 8.3 4.7 6.5  HGB 9.8* 9.8* 9.2* 8.7*  HCT 29.7* 28.9* 27.7* 27.3*  PLT 226 204 194 192  MCV 79.4 79.8 80.8 80.5  MCH 26.2 27.1 26.8 25.7*  MCHC 33.0 33.9 33.2 31.9  RDW 14.6 14.7 14.9 14.7  LYMPHSABS  --   --   --  1.3  MONOABS  --   --   --  0.6  EOSABS  --   --   --  0.3  BASOSABS  --   --   --  0.1    Chemistries   Recent Labs Lab 05/07/15 0530 05/07/15 1027 05/08/15 0503 05/09/15 0301  NA 132*  --  135 137  K 3.7  --  4.0 3.3*  CL 99*  --  101 103  CO2 21*  --  21* 23  GLUCOSE 121*  --  98 102*  BUN 15  --  17 14  CREATININE 1.42* 1.39* 1.56* 1.44*  CALCIUM 8.6*  --  8.2* 8.1*    CBG:  Recent Labs Lab 05/07/15 1713 05/08/15 1145 05/08/15 1619  GLUCAP 130* 131* 96    GFR Estimated Creatinine Clearance: 36.6 mL/min (by C-G formula based on Cr of 1.44).  Coagulation  profile  Recent Labs Lab 05/07/15 1027 05/08/15 0503  INR 1.39 1.28    Cardiac Enzymes  Recent Labs Lab 05/07/15 0530  TROPONINI 0.06*    Invalid input(s): POCBNP No results for input(s): DDIMER in the last 72 hours. No results for input(s): HGBA1C in the last 72 hours. No results for input(s): CHOL, HDL, LDLCALC, TRIG, CHOLHDL, LDLDIRECT in the last 72 hours. No results for input(s): TSH, T4TOTAL, T3FREE, THYROIDAB in the last 72 hours.  Invalid input(s): FREET3 No results for input(s): VITAMINB12, FOLATE, FERRITIN, TIBC, IRON, RETICCTPCT in the last 72 hours.  Recent Labs  05/07/15 0530  LIPASE 25    Urine Studies No results for input(s): UHGB, CRYS in the last 72 hours.  Invalid input(s): UACOL, UAPR, USPG, UPH, UTP, UGL, UKET, UBIL, UNIT, UROB, ULEU, UEPI, UWBC, URBC, UBAC, CAST, UCOM, BILUA  MICROBIOLOGY: Recent Results (from the past 240 hour(s))  Urine culture     Status: None   Collection Time: 04/30/15  2:08 PM  Result Value Ref Range Status   Specimen Description URINE, CATHETERIZED  Final   Special Requests Normal  Final   Culture   Final    60,000 COLONIES/ml ENTEROCOCCUS SPECIES [facility]    Report Status 05/03/2015 FINAL  Final   Organism ID, Bacteria ENTEROCOCCUS SPECIES  Final      Susceptibility   Enterococcus species - MIC*    AMPICILLIN <=2 SENSITIVE Sensitive     LEVOFLOXACIN 1 SENSITIVE Sensitive     NITROFURANTOIN <=16 SENSITIVE Sensitive     VANCOMYCIN 2 SENSITIVE Sensitive     * 60,000 COLONIES/ml ENTEROCOCCUS SPECIES  Urine culture     Status: None   Collection Time: 05/07/15  5:20 AM  Result Value Ref Range Status   Specimen Description URINE, RANDOM  Final   Special Requests NONE  Final   Culture NO GROWTH 1 DAY  Final   Report Status 05/08/2015 FINAL  Final  Culture, blood (Routine X 2) w Reflex to ID Panel     Status: None (Preliminary result)   Collection Time: 05/07/15  5:45 AM  Result Value Ref  Range Status   Specimen Description BLOOD LEFT FOREARM  Final   Special Requests BOTTLES DRAWN AEROBIC ONLY 5CC  Final   Culture NO GROWTH 2 DAYS  Final   Report Status PENDING  Incomplete  Culture, blood (Routine X 2) w Reflex to ID Panel     Status: None (Preliminary result)   Collection Time: 05/07/15  5:49 AM  Result Value Ref Range Status   Specimen Description BLOOD RIGHT ANTECUBITAL  Final   Special Requests BOTTLES DRAWN AEROBIC ONLY 10CC  Final   Culture NO GROWTH 2 DAYS  Final   Report Status PENDING  Incomplete    RADIOLOGY STUDIES/RESULTS: Dg Abd Acute W/chest  05/07/2015  CLINICAL DATA:  Acute onset of vomiting.  Initial encounter. EXAM: DG ABDOMEN ACUTE W/ 1V CHEST COMPARISON:  Chest radiograph from 11/08/2014 FINDINGS: The lungs are well-aerated. Small bilateral pleural effusions are noted. Vascular congestion is seen, with mildly increased interstitial markings, concerning for mild interstitial edema. There is no evidence of pneumothorax. A small calcified granuloma is noted near the right lung apex. The cardiomediastinal silhouette is mildly enlarged. A pacemaker is noted overlying the left chest wall, with a single lead ending overlying the right ventricle. The visualized bowel gas pattern is unremarkable. Scattered stool and air are seen within the colon; there is no evidence of small bowel dilatation to suggest obstruction. No free intra-abdominal air is identified on the provided upright view. No acute osseous abnormalities are seen; the sacroiliac joints are unremarkable in appearance. Mild degenerative change is noted along the lower lumbar spine. IMPRESSION: 1. Unremarkable bowel gas pattern; no free intra-abdominal air seen. Large amount of stool noted in the colon, raising concern for constipation. 2. Small bilateral pleural effusions noted. Vascular congestion and mild cardiomegaly. Mildly increased interstitial markings raise concern for mild interstitial edema.  Electronically Signed   By: Roanna RaiderJeffery  Chang M.D.   On: 05/07/2015 06:11    Jeoffrey MassedGHIMIRE,Maybel Dambrosio, MD  [facility] Pager:[phone]  If 7PM-7AM, please contact night-coverage [email or web address] Password [email or web address] 05/09/2015, 1:56 PM   LOS: 2 days

## 2015-05-10 DIAGNOSIS — N179 Acute kidney failure, unspecified: Secondary | ICD-10-CM

## 2015-05-10 DIAGNOSIS — I10 Essential (primary) hypertension: Secondary | ICD-10-CM

## 2015-05-10 DIAGNOSIS — I480 Paroxysmal atrial fibrillation: Secondary | ICD-10-CM

## 2015-05-10 DIAGNOSIS — I5032 Chronic diastolic (congestive) heart failure: Secondary | ICD-10-CM

## 2015-05-10 LAB — BASIC METABOLIC PANEL
ANION GAP: 11 (ref 5–15)
BUN: 17 mg/dL (ref 6–20)
CO2: 23 mmol/L (ref 22–32)
Calcium: 8.2 mg/dL — ABNORMAL LOW (ref 8.9–10.3)
Chloride: 104 mmol/L (ref 101–111)
Creatinine, Ser: 1.21 mg/dL (ref 0.61–1.24)
GFR calc Af Amer: 60 mL/min — ABNORMAL LOW (ref >60)
GFR calc non Af Amer: 52 mL/min — ABNORMAL LOW (ref >60)
GLUCOSE: 102 mg/dL — AB (ref 65–99)
POTASSIUM: 3.5 mmol/L (ref 3.5–5.1)
Sodium: 138 mmol/L (ref 135–145)

## 2015-05-10 LAB — GLUCOSE, CAPILLARY: Glucose-Capillary: 109 mg/dL — ABNORMAL HIGH (ref 65–99)

## 2015-05-10 MED ORDER — LEVOTHYROXINE SODIUM 25 MCG PO TABS: 25.0000 ug | ORAL_TABLET | Freq: Every day | ORAL | Status: DC

## 2015-05-10 MED ORDER — FLEET ENEMA 7-19 GM/118ML RE ENEM: 1.0000 | ENEMA | Freq: Every day | RECTAL | Status: DC | PRN

## 2015-05-10 MED ORDER — SENNOSIDES-DOCUSATE SODIUM 8.6-50 MG PO TABS: 2.0000 | ORAL_TABLET | Freq: Every day | ORAL | Status: DC

## 2015-05-10 MED ORDER — POLYETHYLENE GLYCOL 3350 17 G PO PACK: 17.0000 g | PACK | Freq: Every day | ORAL | Status: DC

## 2015-05-10 MED ORDER — DORZOLAMIDE HCL-TIMOLOL MAL 2-0.5 % OP SOLN: 1.0000 [drp] | Freq: Two times a day (BID) | OPHTHALMIC | Status: DC

## 2015-05-10 NOTE — Discharge Summary (Signed)
PATIENT DETAILS Name: Oscar Santiago Age: 82 y.o. Sex: male Date of Birth: 02-May-1925 MRN: [id number]. Admitting Physician: No admitting provider for patient encounter. WUJ:[id number] Artist Pais, DO  Admit Date: 05/07/2015 Discharge date: 05/10/2015  Recommendations for Outpatient Follow-up:  1. Ensure follow up with Urology for voiding trial 2. Completed 10 days of Macrobid-hence stopped 3. Placed on bowel regimen-Miralax/Senokot-as constipated 4. Please repeat CBC/BMET at next visit  PRIMARY DISCHARGE DIAGNOSIS:  Active Problems:   Chronic atrial fibrillation (CHADVASc = 5)   Hypertension   OSA (obstructive sleep apnea)   Cardiac pacemaker in situ   Chronic systolic dysfunction of left ventricle   CKD (chronic kidney disease), stage III   Seizure disorder (HCC)   Hypothyroidism   Retention of urine   Paroxysmal atrial fibrillation (HCC)   Benign prostatic hypertrophy   CHF (congestive heart failure) (HCC)   Hyponatremia   Anemia of chronic disease   Physical deconditioning   Nausea      PAST MEDICAL HISTORY: Past Medical History  Diagnosis Date  . Hypertension   . Seizure disorder (HCC)   . Glaucoma   . Hyperlipidemia   . Osteopenia   . Compression fracture of spine (HCC)     T 10 and T11  . Gout   . OSA (obstructive sleep apnea)     on CPAP therapy  . Hypothyroid   . Inguinal hernia   . Hemorrhoid   . Chronic systolic dysfunction of left ventricle     EF 30-35% by echo 10/10/2009  . Major depression (HCC)   . History of GI bleed     from coumadin  . MR (mitral regurgitation)   . Hearing loss     bilateral -hearing aids  . Dizziness     intermittent  . Kidney stone   . CKD (chronic kidney disease), stage III   . Permanent atrial fibrillation (HCC)     refuses coumadin due to history of GI bleed  . Decreased appetite 03/02/12  . Weight loss 03/02/12  . Complete heart block (HCC)   . CHF (congestive heart failure) (HCC)   . Seizures (HCC)   . Glaucoma   .  Stroke [facility])     DISCHARGE MEDICATIONS: Current Discharge Medication List    START taking these medications   Details  polyethylene glycol (MIRALAX / GLYCOLAX) packet Take 17 g by mouth at bedtime. Qty: 14 each, Refills: 0    senna-docusate (SENOKOT-S) 8.6-50 MG tablet Take 2 tablets by mouth at bedtime. Qty: 60 tablet, Refills: 0      CONTINUE these medications which have CHANGED   Details  !! dorzolamide-timolol (COSOPT) 22.3-6.8 MG/ML ophthalmic solution Place 1 drop into both eyes 2 (two) times daily. Qty: 10 mL, Refills: 12     !! - Potential duplicate medications found. Please discuss with provider.    CONTINUE these medications which have NOT CHANGED   Details  acetaminophen (TYLENOL) 500 MG tablet Take 1 tablet (500 mg total) by mouth every 8 (eight) hours as needed (pain). Qty: 30 tablet, Refills: 0    alendronate (FOSAMAX) 70 MG tablet Take 70 mg by mouth every Monday.     amLODipine (NORVASC) 10 MG tablet Take 10 mg by mouth at bedtime.     aspirin 81 MG tablet Take 81 mg by mouth daily.    cetirizine (ZYRTEC) 10 MG tablet Take 10 mg by mouth daily.    colchicine 0.6 MG tablet Take 0.6 mg by mouth 2 (two) times daily  as needed (gout).    Dextromethorphan-Guaifenesin (CORICIDIN HBP CONGESTION/COUGH) 10-200 MG CAPS Take 1 capsule by mouth daily as needed (for cold). Reported on 04/13/2015    !! dorzolamide-timolol (COSOPT) 22.3-6.8 MG/ML ophthalmic solution Place 1 drop into both eyes 2 (two) times daily. Refills: 6    famotidine (PEPCID AC) 10 MG chewable tablet Chew 10 mg by mouth daily as needed for heartburn.     finasteride (PROSCAR) 5 MG tablet TAKE 1 TABLET (5 MG TOTAL) BY MOUTH DAILY. Refills: 5    fluticasone (FLONASE) 50 MCG/ACT nasal spray USE 2 SPRAYS IN EACH NOSTRIL ONCE A DAY Qty: 16 g, Refills: 3    furosemide (LASIX) 20 MG tablet Take 1 tablet (20 mg total) by mouth daily. Qty: 30 tablet, Refills: 11    hyoscyamine (LEVSIN/SL) 0.125 MG SL  tablet Place 1 tablet (0.125 mg total) under the tongue every 4 (four) hours as needed for cramping. Qty: 30 tablet, Refills: 6    latanoprost (XALATAN) 0.005 % ophthalmic solution Place 1 drop into both eyes at bedtime.     levETIRAcetam (KEPPRA) 500 MG tablet Take 1 tablet (500 mg total) by mouth 2 (two) times daily. Take 1/2 tablet (250 mg) by mouth every morning and 1 tablet (500 mg) every night Qty: 60 tablet, Refills: 3   Associated Diagnoses: Seizure disorder (HCC)    levothyroxine (SYNTHROID, LEVOTHROID) 25 MCG tablet TAKE 1 TABLET (25 MCG TOTAL) BY MOUTH DAILY BEFORE BREAKFAST. Qty: 90 tablet, Refills: 1    mirabegron ER (MYRBETRIQ) 25 MG TB24 tablet Take 25 mg by mouth daily.    Multiple Vitamin (MULTIVITAMIN WITH MINERALS) TABS tablet Take 1 tablet by mouth daily.    nitrofurantoin, macrocrystal-monohydrate, (MACROBID) 100 MG capsule Take 100 mg by mouth 2 (two) times daily.    tamsulosin (FLOMAX) 0.4 MG CAPS capsule Take 1 capsule by Refills: 5    traMADol-acetaminophen (ULTRACET) 37.5-325 MG tablet Take 1 tablet by mouth every 6 (six) hours as needed for severe pain. Qty: 30 tablet, Refills: 0    valsartan (DIOVAN) 320 MG tablet Take 1 tablet (320 mg total) by mouth daily with lunch. Qty: 30 tablet, Refills: 11    vitamin B-12 (CYANOCOBALAMIN) 1000 MCG tablet Take 1,000 mcg by mouth daily.    zolpidem (AMBIEN) 5 MG tablet Take 5 mg by mouth at bedtime.     !! - Potential duplicate medications found. Please discuss with provider.      ALLERGIES:   Allergies  Allergen Reactions  . Coumadin [Warfarin Sodium] Other (See Comments)    GI Bleed   . Warfarin Other (See Comments)    EXCESS BLDG W/COUMADIN EXCESS BLDG W/COUMADIN  . Lisinopril Cough    BRIEF HPI:  See H&P, Labs, Consult and Test reports for all details in brief, patient is a 82 yo male who recently underwent TURP-and has a fole in place-who was recently diagnosed with UTI-on Macrobid-presented to  the hospital for nausea and vomiting.   CONSULTATIONS:   None  PERTINENT RADIOLOGIC STUDIES: Dg Abd Acute W/chest  05/07/2015  CLINICAL DATA:  Acute onset of vomiting.  Initial encounter. EXAM: DG ABDOMEN ACUTE W/ 1V CHEST COMPARISON:  Chest radiograph from 11/08/2014 FINDINGS: The lungs are well-aerated. Small bilateral pleural effusions are noted. Vascular congestion is seen, with mildly increased interstitial markings, concerning for mild interstitial edema. There is no evidence of pneumothorax. A small calcified granuloma is noted near the right lung apex. The cardiomediastinal silhouette is mildly enlarged. A pacemaker is noted  overlying the left chest wall, with a single lead ending overlying the right ventricle. The visualized bowel gas pattern is unremarkable. Scattered stool and air are seen within the colon; there is no evidence of small bowel dilatation to suggest obstruction. No free intra-abdominal air is identified on the provided upright view. No acute osseous abnormalities are seen; the sacroiliac joints are unremarkable in appearance. Mild degenerative change is noted along the lower lumbar spine. IMPRESSION: 1. Unremarkable bowel gas pattern; no free intra-abdominal air seen. Large amount of stool noted in the colon, raising concern for constipation. 2. Small bilateral pleural effusions noted. Vascular congestion and mild cardiomegaly. Mildly increased interstitial markings raise concern for mild interstitial edema. Electronically Signed   By: Roanna RaiderJeffery  Chang M.D.   On: 05/07/2015 06:11     PERTINENT LAB RESULTS: CBC:  Recent Labs  05/08/15 0503 05/09/15 0301  WBC 4.7 6.5  HGB 9.2* 8.7*  HCT 27.7* 27.3*  PLT 194 192   CMET CMP     Component Value Date/Time   NA 138 05/10/2015 0521   K 3.5 05/10/2015 0521   CL 104 05/10/2015 0521   CO2 23 05/10/2015 0521   GLUCOSE 102* 05/10/2015 0521   BUN 17 05/10/2015 0521   CREATININE 1.21 05/10/2015 0521   CALCIUM 8.2* 05/10/2015  0521   PROT 6.2* 05/08/2015 0503   ALBUMIN 2.6* 05/09/2015 0301   AST 28 05/08/2015 0503   ALT 11* 05/08/2015 0503   ALKPHOS 60 05/08/2015 0503   BILITOT 0.7 05/08/2015 0503   GFRNONAA 52* 05/10/2015 0521   GFRAA 60* 05/10/2015 0521    GFR Estimated Creatinine Clearance: 43.6 mL/min (by C-G formula based on Cr of 1.21). No results for input(s): LIPASE, AMYLASE in the last 72 hours. No results for input(s): CKTOTAL, CKMB, CKMBINDEX, TROPONINI in the last 72 hours. Invalid input(s): POCBNP No results for input(s): DDIMER in the last 72 hours. No results for input(s): HGBA1C in the last 72 hours. No results for input(s): CHOL, HDL, LDLCALC, TRIG, CHOLHDL, LDLDIRECT in the last 72 hours. No results for input(s): TSH, T4TOTAL, T3FREE, THYROIDAB in the last 72 hours.  Invalid input(s): FREET3 No results for input(s): VITAMINB12, FOLATE, FERRITIN, TIBC, IRON, RETICCTPCT in the last 72 hours. Coags:  Recent Labs  05/07/15 1027 05/08/15 0503  INR 1.39 1.28   Microbiology: Recent Results (from the past 240 hour(s))  Urine culture     Status: None   Collection Time: 04/30/15  2:08 PM  Result Value Ref Range Status   Specimen Description URINE, CATHETERIZED  Final   Special Requests Normal  Final   Culture   Final    60,000 COLONIES/ml ENTEROCOCCUS SPECIES [facility]    Report Status 05/03/2015 FINAL  Final   Organism ID, Bacteria ENTEROCOCCUS SPECIES  Final      Susceptibility   Enterococcus species - MIC*    AMPICILLIN <=2 SENSITIVE Sensitive     LEVOFLOXACIN 1 SENSITIVE Sensitive     NITROFURANTOIN <=16 SENSITIVE Sensitive     VANCOMYCIN 2 SENSITIVE Sensitive     * 60,000 COLONIES/ml ENTEROCOCCUS SPECIES  Urine culture     Status: None   Collection Time: 05/07/15  5:20 AM  Result Value Ref Range Status   Specimen Description URINE, RANDOM  Final   Special Requests NONE  Final   Culture NO GROWTH 1 DAY  Final   Report Status 05/08/2015 FINAL  Final   Culture, blood (Routine X 2) w Reflex to ID Panel  Status: None (Preliminary result)   Collection Time: 05/07/15  5:45 AM  Result Value Ref Range Status   Specimen Description BLOOD LEFT FOREARM  Final   Special Requests BOTTLES DRAWN AEROBIC ONLY 5CC  Final   Culture NO GROWTH 2 DAYS  Final   Report Status PENDING  Incomplete  Culture, blood (Routine X 2) w Reflex to ID Panel     Status: None (Preliminary result)   Collection Time: 05/07/15  5:49 AM  Result Value Ref Range Status   Specimen Description BLOOD RIGHT ANTECUBITAL  Final   Special Requests BOTTLES DRAWN AEROBIC ONLY 10CC  Final   Culture NO GROWTH 2 DAYS  Final   Report Status PENDING  Incomplete     BRIEF HOSPITAL COURSE:  Nausea with vomiting: Rresolved-tolerating diet for the past 2 days. Not sure if this was secondary to side effect from Macrobid or large stool burden. In any event, seems to have resolved, abdomen is soft, nontender and nondistended on exam. Abdominal x-ray on 4/3 was without any acute abnormalities except for large stool burden.  Constipation: Large stool burden seen on x-ray abdomen a few days back,has had small BM while in the hospital. Will be given Fleet enema prior to discharge, and will be placed on a bowel regimen with Senakot and Miralax on discharge. Family/patient instructed to back of laxatives if >2 BM's a day  Complicated UTI: Has a Foley catheter in place since TURP last month. Urine culture on 3/27 was positive for 60,000 colonies of enterococcus, has completed more 7 days of therapy with Macrobid. WIll discontinue all antibiotics and monitor off antibiotics.Doing well with no fever off Abx  Mild ARF: Likely secondary to prerenal azotemia in a setting of vomiting. Doubt obstructive uropathy, as Foley is in place and draining well. Creatinine has normalized with IVF .  Chronic systolic heart failure (EF 40% on TTE September 2016): Clinically compensated.Resume Lasix on  discharge.  Generalized weakness: Suspect multifactorial from recent UTI, recent prostate surgery-acute illness with nausea vomiting. PT evaluation, recommendations are for home health services on discharge.  Hypothyroidism: Continue Synthroid.  BPH: Recent TURP-Foley catheter in place, continue finasteride and Flomax. Have asked patient to follow with  Dr. Imelda Pillow office for a voiding trial. Note foley catheter was in place prior to this admission.   Seizure disorder: Continue Keppra.  History of paroxysmal atrial fibrillation: Reviewed outpatient cardiology note (03/14/15)-not on anticoagulation because of recurrent hematuria. Paced rhythm, on aspirin.CHad2Vasc2 score=5  History of carotid artery stenosis:status post right carotid endarterectomy, has approximately 60-79% stenosis in the left carotid artery. Vascular surgery following patient in the outpatient setting, reviewed note-plans were to complete TURP first before proceeding with left carotid arterectomy. Will require venovenous follow-up on discharge.  Hypertension: Controlled with amlodipine  TODAY-DAY OF DISCHARGE:  Subjective:   Braxley Balandran today has no headache,no chest abdominal pain,no new weakness tingling or numbness, feels much better wants to go home today.   Objective:   Blood pressure 141/66, pulse 58, temperature 97.9 F (36.6 C), temperature source Oral, resp. rate 18, height  (1.778 m), weight 84.414 kg (186 lb 1.6 oz), SpO2 100 %.  Intake/Output Summary (Last 24 hours) at 05/10/15 0942 Last data filed at 05/10/15 0600  Gross per 24 hour  Intake    700 ml  Output    901 ml  Net   -201 ml   Filed Weights   05/08/15 0449 05/09/15 0505 05/10/15 0712  Weight: 83.643 kg (184 lb 6.4  oz) 82.872 kg (182 lb 11.2 oz) 84.414 kg (186 lb 1.6 oz)    Exam Awake Alert, Oriented *3, No new F.N deficits, Normal affect [place].AT,PERRAL Supple Neck,No JVD, No cervical lymphadenopathy appriciated.  Symmetrical  Chest wall movement, Good air movement bilaterally, CTAB RRR,No Gallops,Rubs or new Murmurs, No Parasternal Heave +ve B.Sounds, Abd Soft, Non tender, No organomegaly appriciated, No rebound -guarding or rigidity. No Cyanosis, Clubbing or edema, No new Rash or bruise  DISCHARGE CONDITION: Stable  DISPOSITION: Home with home health services  DISCHARGE INSTRUCTIONS:    Activity:  As tolerated with Full fall precautions use walker/cane & assistance as needed  Get Medicines reviewed and adjusted: Please take all your medications with you for your next visit with your Primary MD  Please request your Primary MD to go over all hospital tests and procedure/radiological results at the follow up, please ask your Primary MD to get all Hospital records sent to his/her office.  If you experience worsening of your admission symptoms, develop shortness of breath, life threatening emergency, suicidal or homicidal thoughts you must seek medical attention immediately by calling 911 or calling your MD immediately  if symptoms less severe.  You must read complete instructions/literature along with all the possible adverse reactions/side effects for all the Medicines you take and that have been prescribed to you. Take any new Medicines after you have completely understood and accpet all the possible adverse reactions/side effects.   Do not drive when taking Pain medications.   Do not take more than prescribed Pain, Sleep and Anxiety Medications  Special Instructions: If you have smoked or chewed Tobacco  in the last 2 yrs please stop smoking, stop any regular Alcohol  and or any Recreational drug use.  Wear Seat belts while driving.  Please note  You were cared for by a hospitalist during your hospital stay. Once you are discharged, your primary care physician will handle any further medical issues. Please note that NO REFILLS for any discharge medications will be authorized once you are discharged, as  it is imperative that you return to your primary care physician (or establish a relationship with a primary care physician if you do not have one) for your aftercare needs so that they can reassess your need for medications and monitor your lab values.   Diet recommendation: Heart Healthy diet  Discharge Instructions    Call MD for:  persistant nausea and vomiting    Complete by:  As directed      Diet - low sodium heart healthy    Complete by:  As directed      Increase activity slowly    Complete by:  As directed            Follow-up Information    Follow up with [name], DO. Schedule an appointment as soon as possible for a visit in 1 week.   Specialty:  Internal Medicine   Why:  Hospital follow up   Contact information:   [address] Christena Flake [facility] [place] [address] [phone]       Follow up with Kathi Ludwig, MD. Schedule an appointment as soon as possible for a visit in 1 week.   Specialty:  Urology   Why:  Hospital follow up   Contact information:   [address] [place] [address] [phone]      Total Time spent on discharge equals 45 minutes.  SignedJeoffrey Massed 05/10/2015 9:42 AM

## 2015-05-10 NOTE — Progress Notes (Signed)
Occupational Therapy Treatment Patient Details Name: Oscar Santiago MRN: [id number] DOB: 07/24/1925 Today's Date: 05/10/2015    History of present illness 82 y.o. male recently discharged from hospital for the treatmetn of UTI, now presenting with chills, nausea with vomiting x1 without hematemesis. PMH: chronic low back pain, CVA, seizures, atrial fibrillation, depression, CKD, CHF, pacemaker   OT comments  Pt hopeful to go home asap  Follow Up Recommendations  No OT follow up    Equipment Recommendations  None recommended by OT    Recommendations for Other Services      Precautions / Restrictions Precautions Precautions: Fall       Mobility Bed Mobility Overal bed mobility: Modified Independent             General bed mobility comments: use of bed rail  Transfers Overall transfer level: Needs assistance Equipment used: Rolling walker (2 wheeled) Transfers: Sit to/from [employer] CorporationStand;Stand Pivot Transfers Sit to Stand: Supervision Stand pivot transfers: Supervision                ADL Overall ADL's : Needs assistance/impaired     Grooming: Sitting Grooming Details (indicate cue type and reason): shaving             Lower Body Dressing: Supervision/safety;Sit to/from stand;Cueing for sequencing;Cueing for safety   Toilet Transfer: Min guard;RW           Functional mobility during ADLs: Supervision/safety;Rolling walker                      Extremity/Trunk Assessment                          Pertinent Vitals/ Pain       Pain Assessment: No/denies pain         Frequency Min 2X/week     Progress Toward Goals  OT Goals(current goals can now be found in the care plan section)  Progress towards OT goals: Progressing toward goals     Plan Discharge plan remains appropriate       End of Session Equipment Utilized During Treatment: Gait belt;Rolling walker   Activity Tolerance Patient tolerated treatment well   Patient Left  in chair;with call bell/phone within reach;with family/visitor present (no chair alarm due to per chart pt's family had requested no alarms since they would be with him at all times.  Asked grand-daughter in room to please let RN know if she was leaving due to alarm not on chair)   Nurse Communication Mobility status        Time: [id number] OT Time Calculation (min): 17 min  Charges: OT General Charges $OT Visit: 1 Procedure OT Treatments $Self Care/Home Management : 8-22 mins  Oscar Santiago, Oscar Santiago 05/10/2015, 1:30 PM

## 2015-05-10 NOTE — Consult Note (Signed)
   [facility] CM Inpatient Consult   05/10/2015  Oscar Santiago May 26, 1925 [id number] Met with the patient and his daughter regarding the restart of care management services as a benefit of Medicare. Patient was admitted with HF exacerbation.  Patient states, "I have had a stroke so I let my granddaughter make the decisions."  Daughter placed a call to [place] at [phone], HIPAA was verified. [name] states she will not be able to come today prior to the patient's discharge.  She states she would like to restart the [facility] Management services but she felt he needed a nurse to come from home health with this admission.  Will also let inpatient case manager know of family's request, if possible.  Patient will receive post hospital follow up calls and be assessed for home visits. Update inpatient RNCM on patient with [facility] Care Management.  For questions, please contact:  Natividad Brood, RN BSN [employer] Hospital Liaison  [phone] business mobile phone Toll free office [phone]

## 2015-05-10 NOTE — Care Management Important Message (Signed)
Important Message  Patient Details  Name: Oscar Santiago MRN: [id number] Date of Birth: 01/17/1926   Medicare Important Message Given:  Yes    Diondre Pulis P Aamir Mclinden 05/10/2015, 12:43 PM

## 2015-05-12 LAB — CULTURE, BLOOD (ROUTINE X 2)
Culture: NO GROWTH
Culture: NO GROWTH

## 2015-05-14 ENCOUNTER — Other Ambulatory Visit: Payer: Self-pay

## 2015-05-14 NOTE — Patient Outreach (Signed)
This RNCM made call to patient for transition of care. Unsuccessful attempt made on Friday, April 7.  Today's call was answered by patient's granddaughter and primary caregiver. Granddaughter provided information to satisfy HIPPA requirements by offering date of birth and address. Granddaughter stated she was about to call because she was just getting him home form doctor's appointment with urologist.  Granddaughter stated patient was wheezing, had some shortness of breath.  Granddaughter asked was patient's shortness of breath assessed by physician or was he having it at that time. Granddaughter stated he was having it and the doctor said that problem was out of his scope. Granddaughter advised this RNCM [facility] had been out Friday to do the intake, she was not aware of an upcoming appointment.  This RNCM call [facility], was told RN from their agency would be out to patient's home tomorrow. Call made back to granddaughter, advised of [facility] plan to send RN tomorrow.  This RNCM made urgent visit. RNCM arrived at patient's home to find him, his other granddaughter and eldest daughter at home. Patient was in the kitchen at the table eating a 3 piece fried chicken from Mrs. [employer], with bisuit and large drink. Patient's respirations were even an unlabored, he denies any shortness of breath. Patient's lung fields were clear to ausculation.  Patient and his caregivers advised to call 911 if he develops shortness of breath. Collaboration between this RNCM, patient and caregivers to form initial case management care plan to include Heart Failure Education  Plan: Home visit again next week

## 2015-05-17 ENCOUNTER — Ambulatory Visit: Payer: Medicare Other | Admitting: Family Medicine

## 2015-05-17 DIAGNOSIS — Z0289 Encounter for other administrative examinations: Secondary | ICD-10-CM

## 2015-05-23 ENCOUNTER — Other Ambulatory Visit: Payer: Self-pay

## 2015-05-23 NOTE — Patient Outreach (Signed)
This RNCM was successful in making an contact with patient's granddaughter, Mit[place], who is patient's consented represented.    Granddaughter providing HIPPA identifiers by giving date date of birth and address. Granddaughter stated patient is currently at urologist office, hopefully, to get his foley removed. Granddaughter and this RNCM reviewed case management care plan, updated.  Plan: Telephone contact with patient and family next week.

## 2015-06-01 DIAGNOSIS — I5041 Acute combined systolic (congestive) and diastolic (congestive) heart failure: Secondary | ICD-10-CM

## 2015-06-01 NOTE — Patient Outreach (Signed)
[facility] Care Management  06/01/2015  Heloise OchoaHaywood N Tofte 08/27/1925 [id number]   This RNCM received call from patient's granddaughter an d primary caregiver, Mitokia. Mitokia identified herself by providing date of birth and address. Mitokia stated the purpose of her call was to request assistance in getting connected with a provider for in home aide service. Mitokia informed that because patient's income is over $1000, she would have to privately pay for this service.   Mitokia also advised that [facility]'s LCSW would be able to assist  For additional information and community resources.  Plan: Made referral for LCSW to assist. Telephone call next week for follow up.

## 2015-06-04 ENCOUNTER — Other Ambulatory Visit: Payer: Self-pay | Admitting: Licensed Clinical Social Worker

## 2015-06-04 NOTE — Patient Outreach (Signed)
[facility]) Care Management  06/04/2015  Oscar OchoaHaywood N Rivers 09/24/1925 [id number]   Assessment-CSW [facility] requesting that granddaughter is requesting resources. CSW completed outreach to daughter but was unable to reach her. CSWw left a HIPPA compliant voice message.  Plan-CSW will make additional outreach attempt by the end of this week.  Dickie LaBrooke Kendan Cornforth, BSW, MSW, LCSW [facility] Care Management [email or web address] Phone: [phone] Fax: [phone]

## 2015-06-05 ENCOUNTER — Other Ambulatory Visit: Payer: Self-pay | Admitting: Licensed Clinical Social Worker

## 2015-06-05 NOTE — Patient Outreach (Signed)
[facility]) Care Management  06/05/2015  Oscar OchoaHaywood N Santiago 06/04/1925 [id number]   Assessment-CSW completed additional outreach to patient's daughter and was able to reach her. HIPPA verifications provided. CSW introduced self, reason for call and of [facility] social work services. Daughter shares that patient's spouse and her mother will be having knee surgery this week and will be at a SNF for 21 days. Spouse is the usual caregiver to patient. RNCM requested that CSW provide resource information for caregivers to daughter. Daughter shares that she has been networking and has found a CNA with her church that will charge her $10.00 per hour to provide care to patient. CSW questioned if she needed private pay agency list and she declined stating "I will just network." CSW provided education on the [facility] but she is not interested in this program. CSW provided education on the [facility] with DSS and she is agreeable to CSW mailing out resource information. Daughter denies needing an additional social work services.  Plan-CSW will update RNCM. CSW will send in basket message to [facility] Care Management Assistant to mail out resources. CSW will complete social work discharge.  Dickie LaBrooke Keera Altidor, BSW, MSW, LCSW [facility] Care Management [email or web address] Phone: [phone] Fax: [phone]

## 2015-06-06 ENCOUNTER — Other Ambulatory Visit: Payer: Self-pay

## 2015-06-06 DIAGNOSIS — I5041 Acute combined systolic (congestive) and diastolic (congestive) heart failure: Secondary | ICD-10-CM

## 2015-06-06 NOTE — Patient Outreach (Signed)
This RNCM made telephone call to patient's granddaughter and primary caregiver, Ervin KnackMikosha. Ervin KnackMikosha provided date of birth and address to verify HIPPA.  This RNCM stated the purpose of the call is to provide information on local home care services in the area and average price list of $15.00 per hour.  Ervin KnackMikosha stated she is not interested in services through an agency, stated she had found someone in their church that will provide personal care services at a much lower rate.  [name] encouraged to call if further case management services is needed.   Ervin KnackMikosha also advised that this RNCM had made a referral for pharmacy services, Ervin KnackMikosha had previously stated she had missed a few payments on patient's medicare premium visit which resulted in loss of this benefit.  Ervin KnackMikosha stated she appealed and appeal was denied.  Plan: [facility] to [facility] next week for heart failure education.

## 2015-06-06 NOTE — Patient Outreach (Signed)
[facility]N) Care Management  06/06/2015  Oscar Santiago 03/05/1925 [id number]   Request received from Dickie LaBrooke Joyce, LCSW to mail patient home service information. Information mailed today, 06/06/15

## 2015-06-07 ENCOUNTER — Encounter: Payer: Medicare Other | Admitting: Nurse Practitioner

## 2015-06-08 ENCOUNTER — Other Ambulatory Visit: Payer: Self-pay

## 2015-06-08 NOTE — Patient Outreach (Signed)
Unsuccessful attempt made to contact patient's granddaughter Oscar Santiago, who is patient's primary caregiver, to follow up with community care coordination needs.  HIPPA compliant message left with this RNCM's contact information.  Plan: Discharge call on Friday, May 12

## 2015-06-29 ENCOUNTER — Ambulatory Visit (INDEPENDENT_AMBULATORY_CARE_PROVIDER_SITE_OTHER): Payer: Medicare Other | Admitting: Family Medicine

## 2015-06-29 ENCOUNTER — Other Ambulatory Visit: Payer: Self-pay | Admitting: Pharmacist

## 2015-06-29 ENCOUNTER — Telehealth: Payer: Self-pay | Admitting: Internal Medicine

## 2015-06-29 ENCOUNTER — Encounter: Payer: Self-pay | Admitting: Family Medicine

## 2015-06-29 VITALS — BP 136/70 | HR 72 | Temp 97.9°F | Resp 20 | Wt 181.0 lb

## 2015-06-29 DIAGNOSIS — I6523 Occlusion and stenosis of bilateral carotid arteries: Secondary | ICD-10-CM | POA: Diagnosis not present

## 2015-06-29 DIAGNOSIS — R059 Cough, unspecified: Secondary | ICD-10-CM

## 2015-06-29 DIAGNOSIS — K219 Gastro-esophageal reflux disease without esophagitis: Secondary | ICD-10-CM | POA: Diagnosis not present

## 2015-06-29 DIAGNOSIS — R05 Cough: Secondary | ICD-10-CM | POA: Diagnosis not present

## 2015-06-29 MED ORDER — PANTOPRAZOLE SODIUM 40 MG PO TBEC: 40.0000 mg | DELAYED_RELEASE_TABLET | Freq: Every day | ORAL | Status: DC

## 2015-06-29 NOTE — Telephone Encounter (Signed)
Yes

## 2015-06-29 NOTE — Telephone Encounter (Signed)
Pt granddaughter is asking if Dr Caryl NeverBurchette will except him as a transfer pt  from Dr Artist PaisYoo since he has been following up with him why Dr Artist PaisYoo has been out

## 2015-06-29 NOTE — Patient Instructions (Signed)
Food Choices for Gastroesophageal Reflux Disease, Adult When you have gastroesophageal reflux disease (GERD), the foods you eat and your eating habits are very important. Choosing the right foods can help ease the discomfort of GERD. WHAT GENERAL GUIDELINES DO I NEED TO FOLLOW?  Choose fruits, vegetables, whole grains, low-fat dairy products, and low-fat meat, fish, and poultry.  Limit fats such as oils, salad dressings, butter, nuts, and avocado.  Keep a food diary to identify foods that cause symptoms.  Avoid foods that cause reflux. These may be different for different people.  Eat frequent small meals instead of three large meals each day.  Eat your meals slowly, in a relaxed setting.  Limit fried foods.  Cook foods using methods other than frying.  Avoid drinking alcohol.  Avoid drinking large amounts of liquids with your meals.  Avoid bending over or lying down until 2-3 hours after eating. WHAT FOODS ARE NOT RECOMMENDED? The following are some foods and drinks that may worsen your symptoms: Vegetables Tomatoes. Tomato juice. Tomato and spaghetti sauce. Chili peppers. Onion and garlic. Horseradish. Fruits Oranges, grapefruit, and lemon (fruit and juice). Meats High-fat meats, fish, and poultry. This includes hot dogs, ribs, ham, sausage, salami, and bacon. Dairy Whole milk and chocolate milk. Sour cream. Cream. Butter. Ice cream. Cream cheese.  Beverages Coffee and tea, with or without caffeine. Carbonated beverages or energy drinks. Condiments Hot sauce. Barbecue sauce.  Sweets/Desserts Chocolate and cocoa. Donuts. Peppermint and spearmint. Fats and Oils High-fat foods, including French fries and potato chips. Other Vinegar. Strong spices, such as black pepper, white pepper, red pepper, cayenne, curry powder, cloves, ginger, and chili powder. The items listed above may not be a complete list of foods and beverages to avoid. Contact your dietitian for more  information.   This information is not intended to replace advice given to you by your health care provider. Make sure you discuss any questions you have with your health care provider.   Document Released: 01/20/2005 Document Revised: 02/10/2014 Document Reviewed: 11/24/2012 Elsevier Interactive Patient Education 2016 Elsevier Inc.  

## 2015-06-29 NOTE — Patient Outreach (Signed)
[facility]) Care Management  06/29/2015  Oscar OchoaHaywood N Santiago 10/05/1925 [id number]  [facility] Pharmac[facility] had placed a call to number in chart for patient following [facility] referral from [facility], [facility] RN [facility] Care Coordinator.  No answer, so a HIPAA compliant message was left requesting a call back.    Patient's grand-daughter and caregiver Oscar KnackMikosha, returned call back immediately  She verified identity and pharmacist explained purpose of call---Oscar Santiago requests a call back at 1230 to discuss patient assistance options.

## 2015-06-29 NOTE — Patient Outreach (Signed)
[facility]) Care Management  06/29/2015  Oscar Santiago 01/16/1926 [id number]  Returned call to patient's grand-daughter, Oscar Santiago at scheduled time.  She verified identity.  Oscar Santiago states that at some point patient's Medicare Part D premium was not paid, and his plan was cancelled by the insurance company.  She reports she has appealed this decision and it was denied.  Discussed that patient will need to re-enroll in a Medicare Part D plan during the annual open enrollment period for Medicare, which is typically in fall.  There may be a premium penalty for each month that patient did not have prescription drug coverage that Medicare enforces, and coverage would not become effective until 02/04/2016.   Discussed SSA Extra Help and Oscar Santiago reports patient's income exceeds requirements.   Reviewed medication list over the phone and Oscar Santiago reports the most expensive medications are levetiracetam, finasteride, and valsartan.  She reports medications are all obtained from [facility] on [address].    Oscar Santiago asks if Medicare ever covers disposable briefs for patients---pharmacist is unsure of this but offered to reach out to [facility] Social Worker to see if she has any information on this, whom patient/grand-daughter have worked with before, and she accepted this.    Plan:  Will review medications for potential patient assistance options or RxOutreach for potential cost savings as most of his medications are generic and thus manufacturer patient assistance will be limited.   Scheduled to call back next week, 07/04/15 to discuss findings.    Will contact [facility] Child psychotherapistocial Worker.   Tommye StandardKevin Davonne Jarnigan, PharmD, [facility] Clinical Pharmacist [facility] [phone]

## 2015-06-29 NOTE — Progress Notes (Signed)
Subjective:    Patient ID: Oscar Santiago, male    DOB: 06/20/1925, 82 y.o.   MRN: [id number]  HPI  Seen accompanied by his daughter.  At least 3 week history of cough.  Mostly nonproductive but occasionally productive of clear sputum.  No dyspnea. No wheezing. No fevers or chills.  Daughters noted that his cough seems worse when he eats.  Also has observed that he is frequently "clearing his throat ".  He is not aware of any obvious reflux symptoms.  Does have some postnasal drip symptoms occasionally. He takes Zyrtec regularly.   He has had prior history of stroke. They have not noted any slurred speech or any focal weakness. He's had chronic urinary retention and has been followed closely by urology. He is urinating much better than he was a couple months ago. Did not noted any recent gross hematuria. Appetite is good.  Past Medical History  Diagnosis Date  . Hypertension   . Seizure disorder (HCC)   . Glaucoma   . Hyperlipidemia   . Osteopenia   . Compression fracture of spine (HCC)     T 10 and T11  . Gout   . OSA (obstructive sleep apnea)     on CPAP therapy  . Hypothyroid   . Inguinal hernia   . Hemorrhoid   . Chronic systolic dysfunction of left ventricle     EF 30-35% by echo 10/10/2009  . Major depression (HCC)   . History of GI bleed     from coumadin  . MR (mitral regurgitation)   . Hearing loss     bilateral -hearing aids  . Dizziness     intermittent  . Kidney stone   . CKD (chronic kidney disease), stage III   . Permanent atrial fibrillation (HCC)     refuses coumadin due to history of GI bleed  . Decreased appetite 03/02/12  . Weight loss 03/02/12  . Complete heart block (HCC)   . CHF (congestive heart failure) (HCC)   . Seizures (HCC)   . Glaucoma   . Stroke [facility](HCC)    Past Surgical History  Procedure Laterality Date  . Transurethral resection of prostate    . Cardiac catheterization  2002  . Pacemaker insertion  07/28/06    VVI pacemaker for  complete heart block by Dr Amil AmenEdmunds  . Inguinal hernia repair    . Hemorrhoid surgery    . Colonoscopy with propofol N/A 05/01/2014    Procedure: COLONOSCOPY WITH PROPOFOL;  Surgeon: Iva Booparl E Gessner, MD;  Location: [facility];  Service: Endoscopy;  Laterality: N/A;  . Endarterectomy Right 10/23/2014    Procedure: ENDARTERECTOMY CAROTID;  Surgeon: Larina Earthlyodd F Early, MD;  [facility];  Service: Vascular;  Laterality: Right;  . Ep implantable device N/A 12/26/2014    Procedure: PPM Generator Changeout;  Surgeon: Marinus MawGregg W Taylor, MD;  [facility];  Service: Cardiovascular;  Laterality: N/A;  . Transurethral resection of prostate N/A 04/05/2015    Procedure: TRANSURETHRAL RESECTION OF THE PROSTATE (TURP);  Surgeon: Jethro BolusSigmund Tannenbaum, MD;  Location: [facility] ORS;  Service: Urology;  Laterality: N/A;  . Insertion of suprapubic catheter N/A 04/05/2015    Procedure: INSERTION OF SUPRAPUBIC CATHETER;  Surgeon: Jethro BolusSigmund Tannenbaum, MD;  Location: [facility] ORS;  Service: Urology;  Laterality: N/A;    reports that he has never smoked. He has never used smokeless tobacco. He reports that he does not drink alcohol or use illicit drugs. family history includes Aneurysm in  his brother; CVA in his brother and sister; Heart failure in his mother; Transient ischemic attack in his daughter. There is no history of Colon cancer or Colon polyps. Allergies  Allergen Reactions  . Coumadin [Warfarin Sodium] Other (See Comments)    GI Bleed   . Warfarin Other (See Comments)    EXCESS BLDG W/COUMADIN EXCESS BLDG W/COUMADIN  . Lisinopril Cough      Review of Systems  Constitutional: Negative for fever and chills.  HENT: Positive for congestion.   Respiratory: Positive for cough. Negative for shortness of breath and wheezing.   Cardiovascular: Negative for chest pain.  Gastrointestinal: Negative for abdominal pain.       Objective:   Physical Exam  Constitutional: He appears well-developed and well-nourished.    HENT:  Mouth/Throat: Oropharynx is clear and moist.  Neck: Neck supple. No thyromegaly present.  Cardiovascular: Normal rate and regular rhythm.   Pulmonary/Chest: Effort normal and breath sounds normal. No respiratory distress. He has no wheezes. He has no rales.  Musculoskeletal: He exhibits no edema.  Lymphadenopathy:    He has no cervical adenopathy.          Assessment & Plan:  Persistent cough. Suspect he may have some GERD issues. No weight loss.  Start Protonix 40 mg once daily. Reassess in one month. If not resolving in that point consider chest x-ray. Avoid eating within 3 hours of bedtime. Consider elevate head of bed. Follow-up promptly for any fever or other concerns  Kristian Covey MD [facility]

## 2015-07-03 ENCOUNTER — Telehealth: Payer: Self-pay | Admitting: Internal Medicine

## 2015-07-03 ENCOUNTER — Other Ambulatory Visit: Payer: Self-pay

## 2015-07-03 NOTE — Telephone Encounter (Signed)
Patient Name: Oscar Santiago DOB: 02/01/1926 Initial Comment grandfather: saw dr. Farrell Oursfriday for cough, and dizziness. legs are weak, sleeping more, has to hold on to things to walk, lil dizziness Nurse Assessment Nurse: Charna Elizabethrumbull, RN, Lynden Angathy Date/Time (Eastern Time): 07/03/2015 1:36:19 PM Confirm and document reason for call. If symptomatic, describe symptoms. You must click the next button to save text entered. ---Caller states Oscar Santiago developed dizziness and weakness 4-5 days ago that is worse today. No severe breathing difficulty. No chest pain. No new injury in the past 4-5 days. Alert and responsive. Has the patient traveled out of the country within the last 30 days? ---No Does the patient have any new or worsening symptoms? ---Yes Will a triage be completed? ---Yes Related visit to physician within the last 2 weeks? ---Yes Does the PT have any chronic conditions? (i.e. diabetes, asthma, etc.) ---Yes List chronic conditions. ---Pacemaker, Enlarged Heart, Seizure, Thyroid problems Is this a behavioral health or substance abuse call? ---No Guidelines Guideline Title Affirmed Question Affirmed Notes Dizziness - Lightheadedness [1] MODERATE dizziness (e.g., interferes with normal activities) AND [2] has NOT been evaluated by physician for this (Exception: dizziness caused by heat exposure, sudden standing, or poor fluid intake) Final Disposition User See Physician within 24 Hours Trumbull, RN, Lynden Angathy Comments Scheduled for 11:45am appointment with Dr. Caryl NeverBurchette 07/04/15. Referrals REFERRED TO PCP OFFICE Disagree/Comply: Comply

## 2015-07-03 NOTE — Telephone Encounter (Signed)
Pt scheduled to see Dr. Caryl NeverBurchette on 07/04/15

## 2015-07-03 NOTE — Patient Outreach (Signed)
This RNCM received call from lady who denitrified herself as Patient's granddaughter, Ervin KnackMikosha. Ervin KnackMikosha stated she had taken her grandfather to the doctor last week, she states the doctors told her nothing was wrong. Ervin KnackMikosha stated further she wanted me to draw labs on the patient because she patient's  potassium is low.  Ervin KnackMikosha stated further her granddaughter was not himself over the weekend. This RNCM advised granddaughter labs cannot be drawn without an order. This RNCM further advised that if patient has declined or if she noticed changes, he would need to be seen by his primary care physician or emergency room.  Ervin KnackMikosha stated she did not feel like driving her grandfather all the way across town and be told nothing was wrong Ervin KnackMikosha reminded that drawing blood requires an order, this RNCM was on the way to another patient's home for an appointment.   Ervin KnackMikosha stated she did not feel she had any further need for [facility], states she was thankful for the pharmacy referral.  This RNCM made call to Livia SnellenGeronda Pulliam, RN, [facility] Clinical Assistant  Director to report. This RNCM was also precepted Nigel MormonVictoria Satterfield, Charity fundraiserN.

## 2015-07-04 ENCOUNTER — Ambulatory Visit (INDEPENDENT_AMBULATORY_CARE_PROVIDER_SITE_OTHER): Payer: Medicare Other | Admitting: Family Medicine

## 2015-07-04 ENCOUNTER — Other Ambulatory Visit: Payer: Self-pay | Admitting: Pharmacist

## 2015-07-04 VITALS — BP 120/60 | HR 80 | Temp 97.7°F | Ht 70.0 in | Wt 187.3 lb

## 2015-07-04 DIAGNOSIS — R42 Dizziness and giddiness: Secondary | ICD-10-CM | POA: Diagnosis not present

## 2015-07-04 DIAGNOSIS — D649 Anemia, unspecified: Secondary | ICD-10-CM

## 2015-07-04 DIAGNOSIS — T501X5A Adverse effect of loop [high-ceiling] diuretics, initial encounter: Secondary | ICD-10-CM | POA: Diagnosis not present

## 2015-07-04 DIAGNOSIS — R059 Cough, unspecified: Secondary | ICD-10-CM

## 2015-07-04 DIAGNOSIS — R05 Cough: Secondary | ICD-10-CM | POA: Diagnosis not present

## 2015-07-04 DIAGNOSIS — I6523 Occlusion and stenosis of bilateral carotid arteries: Secondary | ICD-10-CM

## 2015-07-04 LAB — CBC WITH DIFFERENTIAL/PLATELET
Basophils Relative: 0 % (ref 0.0–3.0)
Eosinophils Relative: 4 % (ref 0.0–5.0)
HCT: 31.5 % — ABNORMAL LOW (ref 39.0–52.0)
Hemoglobin: 10.1 g/dL — ABNORMAL LOW (ref 13.0–17.0)
LYMPHS PCT: 41 % (ref 12.0–46.0)
MCHC: 32 g/dL (ref 30.0–36.0)
MCV: 77.8 fl — ABNORMAL LOW (ref 78.0–100.0)
MONOS PCT: 3 % (ref 3.0–12.0)
NEUTROS PCT: 52 % (ref 43.0–77.0)
PLATELETS: 185 10*3/uL (ref 150.0–400.0)
RBC: 4.05 Mil/uL — AB (ref 4.22–5.81)
RDW: 18.3 % — ABNORMAL HIGH (ref 11.5–15.5)
WBC: 3.5 10*3/uL — AB (ref 4.0–10.5)

## 2015-07-04 LAB — BASIC METABOLIC PANEL
BUN: 21 mg/dL (ref 6–23)
CALCIUM: 9 mg/dL (ref 8.4–10.5)
CHLORIDE: 102 meq/L (ref 96–112)
CO2: 26 meq/L (ref 19–32)
Creatinine, Ser: 1.35 mg/dL (ref 0.40–1.50)
GFR: 64.1 mL/min (ref >60.00)
Glucose, Bld: 86 mg/dL (ref 70–99)
Potassium: 3.9 mEq/L (ref 3.5–5.1)
SODIUM: 132 meq/L — AB (ref 135–145)

## 2015-07-04 NOTE — Patient Instructions (Signed)
Follow up for any fever or increased shortness of breath We will call you with lab results.

## 2015-07-04 NOTE — Progress Notes (Signed)
Pre visit review using our clinic review tool, if applicable. No additional management support is needed unless otherwise documented below in the visit note. 

## 2015-07-04 NOTE — Patient Outreach (Signed)
[facility]) Care Management  [facility]   07/04/2015  Oscar Santiago Nov 03, 1925 [id number]  Subjective:  Called patient's caregiver/grand daughter, [name] regarding medication patient assistance options for patient's medications.   Grand daughter reports main medication concerns at this time are obtaining levothyroxine, levetiracetam, valsartan, finasteride, amlodipine and furosemide.    Discussed option of using RXOutreach, but this will not be a quick solution as patient is reported to be out of the above mentioned medications.    Advised [facility] attempted to get cash prices from [facility] in [place] who sometimes has lower cost generics and that pharmacy requested patient/caregiver call to get cash price information and would not provide it over the phone to [facility].    Objective:   Current Medications: Current Outpatient Prescriptions  Medication Sig Dispense Refill  . acetaminophen (TYLENOL) 500 MG tablet Take 1 tablet (500 mg total) by mouth every 8 (eight) hours as needed (pain). 30 tablet 0  . alendronate (FOSAMAX) 70 MG tablet Take 70 mg by mouth every Monday.     Marland Kitchen amLODipine (NORVASC) 10 MG tablet Take 10 mg by mouth at bedtime.     Marland Kitchen aspirin 81 MG tablet Take 81 mg by mouth daily.    . cetirizine (ZYRTEC) 10 MG tablet Take 10 mg by mouth daily.    . colchicine 0.6 MG tablet Take 0.6 mg by mouth 2 (two) times daily as needed (gout).    . Dextromethorphan-Guaifenesin (CORICIDIN HBP CONGESTION/COUGH) 10-200 MG CAPS Take 1 capsule by mouth daily as needed (for cold). Reported on 06/29/2015    . dorzolamide-timolol (COSOPT) 22.3-6.8 MG/ML ophthalmic solution Place 1 drop into both eyes 2 (two) times daily.  6  . dorzolamide-timolol (COSOPT) 22.3-6.8 MG/ML ophthalmic solution Place 1 drop into both eyes 2 (two) times daily. 10 mL 12  . famotidine (PEPCID AC) 10 MG chewable tablet Chew 10 mg by mouth daily as needed for heartburn.     . finasteride  (PROSCAR) 5 MG tablet TAKE 1 TABLET (5 MG TOTAL) BY MOUTH DAILY.  5  . fluticasone (FLONASE) 50 MCG/ACT nasal spray USE 2 SPRAYS IN EACH NOSTRIL ONCE A DAY (Patient taking differently: USE 2 SPRAYS IN EACH NOSTRIL ONCE A DAY AS NEEDED FOR CONGESTION) 16 g 3  . furosemide (LASIX) 20 MG tablet Take 1 tablet (20 mg total) by mouth daily. 30 tablet 11  . hyoscyamine (LEVSIN/SL) 0.125 MG SL tablet Place 1 tablet (0.125 mg total) under the tongue every 4 (four) hours as needed for cramping. 30 tablet 6  . latanoprost (XALATAN) 0.005 % ophthalmic solution Place 1 drop into both eyes at bedtime.     . levETIRAcetam (KEPPRA) 500 MG tablet Take 1 tablet (500 mg total) by mouth 2 (two) times daily. Take 1/2 tablet (250 mg) by mouth every morning and 1 tablet (500 mg) every night (Patient taking differently: Take 250-500 mg by mouth See admin instructions. Take 1/2 tablet (250 mg) by mouth every morning and 1 tablet (500 mg) every night) 60 tablet 3  . levothyroxine (SYNTHROID, LEVOTHROID) 25 MCG tablet TAKE 1 TABLET (25 MCG TOTAL) BY MOUTH DAILY BEFORE BREAKFAST. 90 tablet 1  . mirabegron ER (MYRBETRIQ) 25 MG TB24 tablet Take 25 mg by mouth daily. Reported on 06/29/2015    . Multiple Vitamin (MULTIVITAMIN WITH MINERALS) TABS tablet Take 1 tablet by mouth daily.    . nitrofurantoin, macrocrystal-monohydrate, (MACROBID) 100 MG capsule Take 100 mg by mouth 2 (two) times daily. Reported  on 06/29/2015    . pantoprazole (PROTONIX) 40 MG tablet Take 1 tablet (40 mg total) by mouth daily. 30 tablet 3  . polyethylene glycol (MIRALAX / GLYCOLAX) packet Take 17 g by mouth at bedtime. 14 each 0  . senna-docusate (SENOKOT-S) 8.6-50 MG tablet Take 2 tablets by mouth at bedtime. 60 tablet 0  . tamsulosin (FLOMAX) 0.4 MG CAPS capsule Take 1 capsule by  5  . traMADol-acetaminophen (ULTRACET) 37.5-325 MG tablet Take 1 tablet by mouth every 6 (six) hours as needed for severe pain. 30 tablet 0  . valsartan (DIOVAN) 320 MG tablet Take  1 tablet (320 mg total) by mouth daily with lunch. 30 tablet 11  . vitamin B-12 (CYANOCOBALAMIN) 1000 MCG tablet Take 1,000 mcg by mouth daily.    Marland Kitchen. zolpidem (AMBIEN) 5 MG tablet Take 5 mg by mouth at bedtime.     No current facility-administered medications for this visit.    Functional Status: In your present state of health, do you have any difficulty performing the following activities: 05/23/2015 05/07/2015  Hearing? N Y  Vision? N N  Difficulty concentrating or making decisions? N N  Walking or climbing stairs? Y Y  Dressing or bathing? Y N  Doing errands, shopping? Malvin JohnsY Y  Preparing Food and eating ? Y -  Using the Toilet? Y -  In the past six months, have you accidently leaked urine? Y -  Do you have problems with loss of bowel control? N -  Managing your Medications? N -  Managing your Finances? Y -  Housekeeping or managing your Housekeeping? Y -    Fall/Depression Screening: PHQ 2/9 Scores 05/23/2015 11/28/2014 04/12/2014  PHQ - 2 Score 0 0 0    Assessment:  The following medications are appear to be on the discount list at [facility]:  Levothyroxine $10/#90 Finasteride $9/#30 Furosemide $10/90  Per phone call to [facility] levetiracetam 500 mg #45 is $57.99, valsartan 320 mg #30/$170.17 and amlodipine 10 mg #30/$25.27  Discussed that prescriptions could be transferred to another pharmacy by the caregiver taking empty prescription bottles to pharmacy of her choice and requesting the transfers---caregiver verbalized understanding.    Caregiver requests a call back next week to further discuss medication access options for the remainder of the year for patient as she is leaving town tomorrow and is running short on time.   Plan:  Scheduled time to call caregiver/patient next week to further discuss medication access concerns.   Will route this to PCP and barriers letter to PCP to make PCP aware of [facility] involvement.    Tommye StandardKevin Blaklee Shores, PharmD, [facility] CenterBCACP Clinical  Pharmacist [facility] [phone]

## 2015-07-04 NOTE — Progress Notes (Signed)
Subjective:     Patient ID: Oscar Santiago, male   DOB: [birth year], 82 y.o.   MRN: [id number]  HPI Patient seen for follow-up accompanied by daughter Recent cough. Suspected reflux. We started Protonix. Cough is actually improving. No fever. No dyspnea. No wheezing.  He's had some nonspecific dizziness past few days. By description, this sounds more like vertigo. No syncope or presyncope. He has chronic hearing loss bilaterally with hearing aids. He does have anemia with most recent hemoglobin around 8.7. This is normocytic. Past history of hypokalemia. Remains on low-dose Lasix 20 mg daily.  He has some mild leg edema and weight is up about 5 pounds today but really doesn't have much increased edema at all and no orthopnea. Using Lasix consistently  Past Medical History  Diagnosis Date  . Hypertension   . Seizure disorder (HCC)   . Glaucoma   . Hyperlipidemia   . Osteopenia   . Compression fracture of spine (HCC)     T 10 and T11  . Gout   . OSA (obstructive sleep apnea)     on CPAP therapy  . Hypothyroid   . Inguinal hernia   . Hemorrhoid   . Chronic systolic dysfunction of left ventricle     EF 30-35% by echo 10/10/2009  . Major depression (HCC)   . History of GI bleed     from coumadin  . MR (mitral regurgitation)   . Hearing loss     bilateral -hearing aids  . Dizziness     intermittent  . Kidney stone   . CKD (chronic kidney disease), stage III   . Permanent atrial fibrillation (HCC)     refuses coumadin due to history of GI bleed  . Decreased appetite 03/02/12  . Weight loss 03/02/12  . Complete heart block (HCC)   . CHF (congestive heart failure) (HCC)   . Seizures (HCC)   . Glaucoma   . Stroke [facility])    Past Surgical History  Procedure Laterality Date  . Transurethral resection of prostate    . Cardiac catheterization  2002  . Pacemaker insertion  07/28/06    VVI pacemaker for complete heart block by Dr Amil Amen  . Inguinal hernia repair    . Hemorrhoid  surgery    . Colonoscopy with propofol N/A 05/01/2014    Procedure: COLONOSCOPY WITH PROPOFOL;  Surgeon: [name], MD;  Location: [facility] ENDOSCOPY;  Service: Endoscopy;  Laterality: N/A;  . Endarterectomy Right 10/23/2014    Procedure: ENDARTERECTOMY CAROTID;  Surgeon: Larina Earthly, MD;  Location: [facility] OR;  Service: Vascular;  Laterality: Right;  . Ep implantable device N/A 12/26/2014    Procedure: PPM Generator Changeout;  Surgeon: Marinus Maw, MD;  Location: [facility] INVASIVE CV LAB;  Service: Cardiovascular;  Laterality: N/A;  . Transurethral resection of prostate N/A 04/05/2015    Procedure: TRANSURETHRAL RESECTION OF THE PROSTATE (TURP);  Surgeon: Jethro Bolus, MD;  Location: [facility] ORS;  Service: Urology;  Laterality: N/A;  . Insertion of suprapubic catheter N/A 04/05/2015    Procedure: INSERTION OF SUPRAPUBIC CATHETER;  Surgeon: Jethro Bolus, MD;  Location: [facility] ORS;  Service: Urology;  Laterality: N/A;    reports that he has never smoked. He has never used smokeless tobacco. He reports that he does not drink alcohol or use illicit drugs. family history includes Aneurysm in his brother; CVA in his brother and sister; Heart failure in his mother; Transient ischemic attack in his daughter. There is no history of Colon  cancer or Colon polyps. Allergies  Allergen Reactions  . Coumadin [Warfarin Sodium] Other (See Comments)    GI Bleed   . Warfarin Other (See Comments)    EXCESS BLDG W/COUMADIN EXCESS BLDG W/COUMADIN  . Lisinopril Cough     Review of Systems  Constitutional: Negative for fever and chills.  HENT: Negative for trouble swallowing.   Respiratory: Positive for cough. Negative for shortness of breath and wheezing.   Gastrointestinal: Negative for nausea, vomiting and abdominal pain.  Genitourinary: Negative for dysuria.  Neurological: Positive for dizziness. Negative for syncope.  Psychiatric/Behavioral: Negative for confusion and agitation.       Objective:   Physical  Exam  Constitutional: He appears well-developed and well-nourished.  HENT:  Right Ear: External ear normal.  Left Ear: External ear normal.  Mouth/Throat: Oropharynx is clear and moist.  Neck: Neck supple.  Cardiovascular: Normal rate and regular rhythm.   Pulmonary/Chest: Effort normal and breath sounds normal. No respiratory distress. He has no wheezes. He has no rales.  Musculoskeletal:  Trace edema legs bilaterally       Assessment:     #1 cough. Probable reflux related. Improving on Protonix. #2 dizziness. Sounds as if he has vertigo component mostly. No orthostatic issues. He does have history of normocytic anemia    Plan:     -Continue Protonix once daily -Check labs with CBC and basic metabolic panel -Stay well-hydrated. -Recommend daily weight -follow up promptly for any fever or change of symptoms.  Kristian CoveyBruce W Yulia Ulrich MD [facility]

## 2015-07-12 ENCOUNTER — Other Ambulatory Visit: Payer: Self-pay | Admitting: Pharmacist

## 2015-07-12 NOTE — Patient Outreach (Signed)
[facility] Care Management  07/12/2015  Heloise OchoaHaywood N Landau 12/19/1925 [id number]  Call placed to caregiver/grand-daughter Ervin KnackMikosha, to follow-up and discuss medication patient assistance options.  Caregiver answered call and asked to reschedule.    Plan:  Re-scheduled for next week, Monday at 1000.  Will attempt to discuss medication assistance options at that time.   Tommye StandardKevin Maddalena Linarez, PharmD, [facility] Clinical Pharmacist [facility] [phone]

## 2015-07-13 ENCOUNTER — Encounter: Payer: Self-pay | Admitting: Internal Medicine

## 2015-07-13 ENCOUNTER — Ambulatory Visit (INDEPENDENT_AMBULATORY_CARE_PROVIDER_SITE_OTHER): Payer: Medicare Other | Admitting: Nurse Practitioner

## 2015-07-13 ENCOUNTER — Encounter: Payer: Self-pay | Admitting: Nurse Practitioner

## 2015-07-13 VITALS — BP 130/60 | HR 72 | Ht 70.0 in | Wt 190.0 lb

## 2015-07-13 DIAGNOSIS — I482 Chronic atrial fibrillation: Secondary | ICD-10-CM

## 2015-07-13 DIAGNOSIS — I6523 Occlusion and stenosis of bilateral carotid arteries: Secondary | ICD-10-CM

## 2015-07-13 DIAGNOSIS — I442 Atrioventricular block, complete: Secondary | ICD-10-CM

## 2015-07-13 DIAGNOSIS — I4821 Permanent atrial fibrillation: Secondary | ICD-10-CM

## 2015-07-13 DIAGNOSIS — I5022 Chronic systolic (congestive) heart failure: Secondary | ICD-10-CM | POA: Diagnosis not present

## 2015-07-13 LAB — CUP PACEART INCLINIC DEVICE CHECK
Date Time Interrogation Session: 20170609131503
Implantable Lead Location: 753860
Implantable Lead Model: 4076
MDC IDC LEAD IMPLANT DT: 20080624

## 2015-07-13 NOTE — Patient Instructions (Signed)
Medication Instructions:   Your physician recommends that you continue on your current medications as directed. Please refer to the Current Medication list given to you today.   If you need a refill on your cardiac medications before your next appointment, please call your pharmacy.  Labwork:  NONE ORDER TODAY    Testing/Procedures:  NONE ORDER TODAY    Follow-Up:  Your physician wants you to follow-up in:  IN  6 MONTHS WITH DR Marlou StarksSMITH   You will receive a reminder letter in the mail two months in advance. If you don't receive a letter, please call our office to schedule the follow-up appointment.   Your physician wants you to follow-up in: ONE YEAR WITH ALLRED   You will receive a reminder letter in the mail two months in advance. If you don't receive a letter, please call our office to schedule the follow-up appointment.  Remote monitoring is used to monitor your Pacemaker of ICD from home. This monitoring reduces the number of office visits required to check your device to one time per year. It allows us to keep an eye on the functioning of your device to ensure it is working properly. You are scheduled for a device check from home on .10/12/2015.Marland Kitchen.Marland Kitchen.You may send your transmission at any time that day. If you have a wireless device, the transmission will be sent automatically. After your physician reviews your transmission, you will receive a postcard with your next transmission date.     Any Other Special Instructions Will Be Listed Below (If Applicable).

## 2015-07-13 NOTE — Progress Notes (Signed)
Electrophysiology Office Note Date: 07/13/2015  ID:  Oscar Santiago, DOB 11-14-1925, MRN [id number]  PCP: Kristian Covey, MD Primary Cardiologist: Katrinka Blazing Electrophysiologist: Allred  CC: Pacemaker follow-up  Oscar Santiago is a 82 y.o. male seen today for Dr Johney Frame.  He presents today for routine electrophysiology followup.  Since last being seen in our clinic, the patient reports doing reasonably well.  He struggles with memory problems and has had 3 hospitalizations recently that have left him without as much energy.  He denies chest pain, palpitations, dyspnea, PND, orthopnea, nausea, vomiting, dizziness, syncope, edema, weight gain, or early satiety.  Device History: MDT single chamber PPM implanted 2008 for complete heart block; gen change 2016   Past Medical History  Diagnosis Date  . Hypertension   . Seizure disorder (HCC)   . Glaucoma   . Hyperlipidemia   . Osteopenia   . Compression fracture of spine (HCC)     T 10 and T11  . Gout   . OSA (obstructive sleep apnea)     on CPAP therapy  . Hypothyroid   . Inguinal hernia   . Hemorrhoid   . Chronic systolic dysfunction of left ventricle     EF 30-35% by echo 10/10/2009  . Major depression (HCC)   . History of GI bleed     from coumadin  . MR (mitral regurgitation)   . Hearing loss     bilateral -hearing aids  . Kidney stone   . CKD (chronic kidney disease), stage III   . Permanent atrial fibrillation (HCC)     refuses coumadin due to history of GI bleed  . Complete heart block (HCC)     a. s/p MDT single chamber PPM   . Glaucoma   . Stroke [facility])    Past Surgical History  Procedure Laterality Date  . Transurethral resection of prostate    . Cardiac catheterization  2002  . Pacemaker insertion  07/28/06    VVI pacemaker for complete heart block by Dr Amil Amen  . Inguinal hernia repair    . Hemorrhoid surgery    . Colonoscopy with propofol N/A 05/01/2014    Procedure: COLONOSCOPY WITH PROPOFOL;  Surgeon:  [name], MD;  [facility]: [facility];  Service: Endoscopy;  Laterality: N/A;  . Endarterectomy Right 10/23/2014    Procedure: ENDARTERECTOMY CAROTID;  Surgeon: Larina Earthly, MD;  Location: [facility] OR;  Service: Vascular;  Laterality: Right;  . Ep implantable device N/A 12/26/2014    Procedure: PPM Generator Changeout;  Surgeon: Marinus Maw, MD;  Location: [facility] INVASIVE CV LAB;  Service: Cardiovascular;  Laterality: N/A;  . Transurethral resection of prostate N/A 04/05/2015    Procedure: TRANSURETHRAL RESECTION OF THE PROSTATE (TURP);  Surgeon: Jethro Bolus, MD;  Location[facility] ORS;  Service: Urology;  Laterality: N/A;  . Insertion of suprapubic catheter N/A 04/05/2015    Procedure: INSERTION OF SUPRAPUBIC CATHETER;  Surgeon: Jethro Bolus, MD;  Location: [facility]S;  Service: Urology;  Laterality: N/A;    Current Outpatient Prescriptions  Medication Sig Dispense Refill  . acetaminophen (TYLENOL) 500 MG tablet Take 1 tablet (500 mg total) by mouth every 8 (eight) hours as needed (pain). 30 tablet 0  . amLODipine (NORVASC) 10 MG tablet Take 10 mg by mouth at bedtime.     Marland Kitchen aspirin 81 MG tablet Take 81 mg by mouth daily.    . cetirizine (ZYRTEC) 10 MG tablet Take 10 mg by mouth daily.    . colchicine  0.6 MG tablet Take 0.6 mg by mouth 2 (two) times daily as needed (gout).    . Dextromethorphan-Guaifenesin (CORICIDIN HBP CONGESTION/COUGH) 10-200 MG CAPS Take 1 capsule by mouth daily as needed (for cold). Reported on 06/29/2015    . dorzolamide-timolol (COSOPT) 22.3-6.8 MG/ML ophthalmic solution Place 1 drop into both eyes 2 (two) times daily.  6  . dorzolamide-timolol (COSOPT) 22.3-6.8 MG/ML ophthalmic solution Place 1 drop into both eyes 2 (two) times daily. 10 mL 12  . famotidine (PEPCID AC) 10 MG chewable tablet Chew 10 mg by mouth daily as needed for heartburn.     . finasteride (PROSCAR) 5 MG tablet TAKE 1 TABLET (5 MG TOTAL) BY MOUTH DAILY.  5  . fluticasone (FLONASE) 50 MCG/ACT nasal  spray Place 2 sprays into both nostrils daily.    . furosemide (LASIX) 20 MG tablet Take 1 tablet (20 mg total) by mouth daily. 30 tablet 11  . hyoscyamine (LEVSIN/SL) 0.125 MG SL tablet Place 1 tablet (0.125 mg total) under the tongue every 4 (four) hours as needed for cramping. 30 tablet 6  . latanoprost (XALATAN) 0.005 % ophthalmic solution Place 1 drop into both eyes at bedtime.     . levETIRAcetam (KEPPRA) 500 MG tablet Take 1/2 tablet (250 mg) by mouth every morning and 1 tablet (500 mg) every night    . levothyroxine (SYNTHROID, LEVOTHROID) 25 MCG tablet TAKE 1 TABLET (25 MCG TOTAL) BY MOUTH DAILY BEFORE BREAKFAST. 90 tablet 1  . mirabegron ER (MYRBETRIQ) 25 MG TB24 tablet Take 25 mg by mouth daily. Reported on 06/29/2015    . Multiple Vitamin (MULTIVITAMIN WITH MINERALS) TABS tablet Take 1 tablet by mouth daily.    . nitrofurantoin, macrocrystal-monohydrate, (MACROBID) 100 MG capsule Take 100 mg by mouth 2 (two) times daily. Reported on 06/29/2015    . pantoprazole (PROTONIX) 40 MG tablet Take 1 tablet (40 mg total) by mouth daily. 30 tablet 3  . polyethylene glycol (MIRALAX / GLYCOLAX) packet Take 17 g by mouth at bedtime. 14 each 0  . senna-docusate (SENOKOT-S) 8.6-50 MG tablet Take 2 tablets by mouth at bedtime. 60 tablet 0  . tamsulosin (FLOMAX) 0.4 MG CAPS capsule Take 1 capsule by mouth once daily  5  . traMADol-acetaminophen (ULTRACET) 37.5-325 MG tablet Take 1 tablet by mouth every 6 (six) hours as needed for severe pain. 30 tablet 0  . valsartan (DIOVAN) 320 MG tablet Take 1 tablet (320 mg total) by mouth daily with lunch. 30 tablet 11  . vitamin B-12 (CYANOCOBALAMIN) 1000 MCG tablet Take 1,000 mcg by mouth daily.    Marland Kitchen zolpidem (AMBIEN) 5 MG tablet Take 5 mg by mouth at bedtime.    Marland Kitchen alendronate (FOSAMAX) 70 MG tablet Take 70 mg by mouth every Monday. Reported on 07/13/2015     No current facility-administered medications for this visit.    Allergies:   Coumadin; Warfarin; and  Lisinopril   Social History: Social History   Social History  . Marital Status: Widowed    Spouse Name: N/A  . Number of Children: 6  . Years of Education: N/A   Occupational History  . Retired     Education officer, environmental   Social History Main Topics  . Smoking status: Never Smoker   . Smokeless tobacco: Never Used  . Alcohol Use: No  . Drug Use: No  . Sexual Activity: Not on file   Other Topics Concern  . Not on file   Social History Narrative   Widowed - wife  passed 16 years ago   Careers adviserChurch pastor for 30 years.  Recently retired   Lives alone   6 children      Physician roster:   Dr. Katrinka BlazingSmith - Cardiology   Dr. Leone PayorGessner - Gastroenterology   Dr. Brunilda PayorNesi - Urology   [facility]    Family History: Family History  Problem Relation Age of Onset  . Heart failure Mother   . CVA Sister   . Aneurysm Brother     brain  . CVA Brother   . Colon cancer Neg Hx   . Colon polyps Neg Hx   . Transient ischemic attack Daughter      Review of Systems: All other systems reviewed and are otherwise negative except as noted above.   Physical Exam: VS:  BP 130/60 mmHg  Pulse 72  Ht 5\' 10"  (1.778 m)  Wt 190 lb (86.183 kg)  BMI 27.26 kg/m2 , BMI Body mass index is 27.26 kg/(m^2).  GEN- The patient is elderly appearing, alert and oriented x 3 today.   HEENT: normocephalic, atraumatic; sclera clear, conjunctiva pink; hearing intact; oropharynx clear; neck supple  Lungs- Clear to ausculation bilaterally, normal work of breathing.  No wheezes, rales, rhonchi Heart- Regular rate and rhythm (paced) GI- soft, non-tender, non-distended, bowel sounds present  Extremities- no clubbing, cyanosis, or edema; DP/PT/radial pulses 2+ bilaterally MS- no significant deformity or atrophy Skin- warm and dry, no rash or lesion; PPM pocket well healed Psych- euthymic mood, full affect Neuro- strength and sensation are intact  PPM Interrogation- reviewed in detail today,  See PACEART report  EKG:  EKG  is not ordered today.  Recent Labs: 05/07/2015: B Natriuretic Peptide 261.9* 05/08/2015: ALT 11* 07/04/2015: BUN 21; Creatinine, Ser 1.35; Hemoglobin 10.1*; Platelets 185.0; Potassium 3.9; Sodium 132*   Wt Readings from Last 3 Encounters:  07/13/15 190 lb (86.183 kg)  07/04/15 187 lb 4.8 oz (84.959 kg)  06/29/15 181 lb (82.101 kg)     Other studies Reviewed: Additional studies/ records that were reviewed today include: Dr Jenel LucksAllred's office notes  Assessment and Plan:  1.  Complete heart block Normal PPM function See Pace Art report No changes today  2.  Chronic systolic heart failure Euvolemic on exam Continue current therapy  3.  Permanent atrial fibrillation CHADS2VASC is at least 6 No OAC 2/2 prior GI bleed   Current medicines are reviewed at length with the patient today.   The patient does not have concerns regarding his medicines.  The following changes were made today:  none  Labs/ tests ordered today include: none  Orders Placed This Encounter  Procedures  . Implantable device check     Disposition:   Follow up with [facility], Dr Katrinka BlazingSmith 6 months, Dr Johney FrameAllred 1 year      Signed, Gypsy BalsamAmber Naim Murtha, NP 07/13/2015 1:36 PM  [facility] [address] [place] [address] [phone] (office) [phone] (fax)

## 2015-07-16 ENCOUNTER — Other Ambulatory Visit: Payer: Self-pay | Admitting: Pharmacist

## 2015-07-16 NOTE — Patient Outreach (Signed)
[facility] Pharmac[facility] was scheduled to call grand-daughter/caregiver to follow-up patient assistance options for patient's generic medications.    No answer, left HIPAA compliant message requesting call back.     Plan:  Will make another attempt to reach caregiver if no call back.    Tommye StandardKevin Abaigeal Moomaw, PharmD, [facility] Clinical Pharmacist [facility] [phone]

## 2015-07-20 ENCOUNTER — Other Ambulatory Visit: Payer: Self-pay | Admitting: Pharmacist

## 2015-07-20 NOTE — Patient Outreach (Signed)
Received a call from patient's caregiver/grand daughter, Oscar Santiago.  She was returning call for the outreach attempt that was made 07/16/15.  She verified HIPAA details.    She states that she is still wanting to go through patient's medication to best determine cost-effective ways to obtain refills since patient lost his Part D coverage apparently following unpaid bills.   She states she is wondering about options for colchicine---discussed the [employer] patient assistance program may be an option for brand name Colcrys.  She pulled up the application on line herself and requirements were discussed.  She states that she prefers to complete it her self with patient and take to presciber's office.   Also discussed RxOutreach---she states that at this time she doesn't have all of patient's medications in front of her and would like to schedule a time to review all medications for patient assistance options.  Plan:  Scheduled call next week with caregiver to review patient assistance options like RxOutreach.     Tommye StandardKevin Miller [place], PharmD, [facility] Clinical Pharmacist [facility] [phone]

## 2015-07-24 ENCOUNTER — Other Ambulatory Visit: Payer: Self-pay | Admitting: Pharmacist

## 2015-07-24 ENCOUNTER — Ambulatory Visit: Payer: Self-pay | Admitting: Pharmacist

## 2015-07-24 NOTE — Patient Outreach (Signed)
Phone call to patient's caregiver/grand-daughter [address] [facility] Inc[facility]ha as scheduled to follow-up patient assistance.  Grand-daughter answered call and states that she is ill and requesting to reschedule.  Offered to reschedule for later this week.   Plan to attempt to call back later this week on Thursday.   Tommye StandardKevin Seleena Reimers, PharmD, [facility] Clinical Pharmacist [facility] [phone]

## 2015-07-26 ENCOUNTER — Ambulatory Visit: Payer: Self-pay | Admitting: Pharmacist

## 2015-07-27 ENCOUNTER — Other Ambulatory Visit: Payer: Self-pay | Admitting: Pharmacist

## 2015-07-27 NOTE — Patient Outreach (Signed)
[facility] Care Management  07/27/2015  Heloise OchoaHaywood N Elms 08/01/1925 [id number]  Scheduled call to patient's caregiver/grand daughter regarding medication assistance options.  Grand daughter answered call and said she would call back later.    No return call from grand daughter at this time.   Plan:  Will place a follow-up call next week.   Tommye StandardKevin Daril Warga, PharmD, [facility] Clinical Pharmacist [facility] [phone]

## 2015-07-31 ENCOUNTER — Encounter: Payer: Self-pay | Admitting: Family Medicine

## 2015-07-31 ENCOUNTER — Ambulatory Visit (INDEPENDENT_AMBULATORY_CARE_PROVIDER_SITE_OTHER): Payer: Medicare Other | Admitting: Family Medicine

## 2015-07-31 VITALS — BP 128/70 | HR 88 | Temp 98.1°F | Ht 70.0 in | Wt 181.8 lb

## 2015-07-31 DIAGNOSIS — N183 Chronic kidney disease, stage 3 unspecified: Secondary | ICD-10-CM

## 2015-07-31 DIAGNOSIS — R05 Cough: Secondary | ICD-10-CM

## 2015-07-31 DIAGNOSIS — I10 Essential (primary) hypertension: Secondary | ICD-10-CM | POA: Diagnosis not present

## 2015-07-31 DIAGNOSIS — D638 Anemia in other chronic diseases classified elsewhere: Secondary | ICD-10-CM

## 2015-07-31 DIAGNOSIS — R059 Cough, unspecified: Secondary | ICD-10-CM

## 2015-07-31 DIAGNOSIS — I6523 Occlusion and stenosis of bilateral carotid arteries: Secondary | ICD-10-CM | POA: Diagnosis not present

## 2015-07-31 NOTE — Progress Notes (Signed)
Subjective:    Patient ID: Oscar Santiago, male    DOB: 01/08/1926, 82 y.o.   MRN: [id number]  HPI Medical follow-up.  Recent increased cough. We suspected some reflux issues. We had him hold Fosamax and also started Protonix and cough is fully resolved He thinks he has been on Fosamax for well over 5 years. Denies any active reflux symptoms.  Weight is down to 181 pounds from 187 recently. No peripheral edema issues. No dyspnea  Medications reviewed. Compliant with all. Blood pressure has been stable. No dizziness and no syncope. Chronic kidney disease. Recent labs were stable. He has chronic normocytic anemia in recent hemoglobin improved to 10.1.  Past Medical History  Diagnosis Date  . Hypertension   . Seizure disorder (HCC)   . Glaucoma   . Hyperlipidemia   . Osteopenia   . Compression fracture of spine (HCC)     T 10 and T11  . Gout   . OSA (obstructive sleep apnea)     on CPAP therapy  . Hypothyroid   . Inguinal hernia   . Hemorrhoid   . Chronic systolic dysfunction of left ventricle     EF 30-35% by echo 10/10/2009  . Major depression (HCC)   . History of GI bleed     from coumadin  . MR (mitral regurgitation)   . Hearing loss     bilateral -hearing aids  . Kidney stone   . CKD (chronic kidney disease), stage III   . Permanent atrial fibrillation (HCC)     refuses coumadin due to history of GI bleed  . Complete heart block (HCC)     a. s/p MDT single chamber PPM   . Glaucoma   . Stroke [facility](HCC)    Past Surgical History  Procedure Laterality Date  . Transurethral resection of prostate    . Cardiac catheterization  2002  . Pacemaker insertion  07/28/06    VVI pacemaker for complete heart block by Dr Amil AmenEdmunds  . Inguinal hernia repair    . Hemorrhoid surgery    . Colonoscopy with propofol N/A 05/01/2014    Procedure: COLONOSCOPY WITH PROPOFOL;  Surgeon: Iva Booparl E Gessner, MD;  Location: [facility];  Service: Endoscopy;  Laterality: N/A;  . Endarterectomy  Right 10/23/2014    Procedure: ENDARTERECTOMY CAROTID;  Surgeon: Larina Earthlyodd F Early, MD;  [facility];  Service: Vascular;  Laterality: Right;  . Ep implantable device N/A 12/26/2014    Procedure: PPM Generator Changeout;  Surgeon: Marinus MawGregg W Taylor, MD;  [facility];  Service: Cardiovascular;  Laterality: N/A;  . Transurethral resection of prostate N/A 04/05/2015    Procedure: TRANSURETHRAL RESECTION OF THE PROSTATE (TURP);  Surgeon: Jethro BolusSigmund Tannenbaum, MD;  Location: [facility] ORS;  Service: Urology;  Laterality: N/A;  . Insertion of suprapubic catheter N/A 04/05/2015    Procedure: INSERTION OF SUPRAPUBIC CATHETER;  Surgeon: Jethro BolusSigmund Tannenbaum, MD;  Location: [facility] ORS;  Service: Urology;  Laterality: N/A;    reports that he has never smoked. He has never used smokeless tobacco. He reports that he does not drink alcohol or use illicit drugs. family history includes Aneurysm in his brother; CVA in his brother and sister; Heart failure in his mother; Transient ischemic attack in his daughter. There is no history of Colon cancer or Colon polyps. Allergies  Allergen Reactions  . Coumadin [Warfarin Sodium] Other (See Comments)    GI Bleed   . Warfarin Other (See Comments)    EXCESS BLDG W/COUMADIN EXCESS  BLDG W/COUMADIN  . Lisinopril Cough      Review of Systems  Constitutional: Negative for fever, chills, appetite change and fatigue.  Eyes: Negative for visual disturbance.  Respiratory: Negative for cough, chest tightness and shortness of breath.   Cardiovascular: Negative for chest pain, palpitations and leg swelling.  Gastrointestinal: Negative for nausea, vomiting, abdominal pain and diarrhea.  Genitourinary: Negative for hematuria.  Neurological: Negative for dizziness, syncope, weakness, light-headedness and headaches.  Psychiatric/Behavioral: Negative for confusion.       Objective:   Physical Exam  Constitutional: He appears well-developed and well-nourished.  HENT:    Mouth/Throat: Oropharynx is clear and moist.  Neck: Neck supple. No JVD present.  Cardiovascular: Normal rate and regular rhythm.   Pulmonary/Chest: Effort normal and breath sounds normal. No respiratory distress. He has no wheezes. He has no rales.  Musculoskeletal: He exhibits no edema.  Neurological: He is alert.          Assessment & Plan:  #1 recent cough. Probably reflux related. Resolved. Taper back off Protonix  #2 history of reported osteoporosis. Has been on Fosamax for over 5 years. Recommended stopping Fosamax at this point  #3 hypertension stable and at goal  #4 chronic kidney disease stable by recent labs  #5 anemia of chronic disease stable by recent lab work. Recheck in 6 months  Kristian CoveyBruce W Destyne Goodreau MD [facility]

## 2015-07-31 NOTE — Progress Notes (Signed)
Pre visit review using our clinic review tool, if applicable. No additional management support is needed unless otherwise documented below in the visit note. 

## 2015-07-31 NOTE — Patient Instructions (Signed)
Hold the Fosamax and Protonix for now.

## 2015-08-01 ENCOUNTER — Other Ambulatory Visit: Payer: Self-pay | Admitting: Pharmacist

## 2015-08-01 NOTE — Patient Outreach (Signed)
Unsuccessful attempt to reach patient's care-giver/grand daughter to follow-up medication assistance needs after no return call received from last week.   No answer, left a HIPAA compliant message requesting a call back.    Plan:  Will make another attempt in the next 1-2 weeks if no return call received.   Tommye StandardKevin Jaclin Finks, PharmD, [facility] Clinical Pharmacist [facility] [phone]

## 2015-08-09 ENCOUNTER — Encounter: Payer: Self-pay | Admitting: Pharmacist

## 2015-08-09 ENCOUNTER — Other Ambulatory Visit: Payer: Self-pay | Admitting: Pharmacist

## 2015-08-09 NOTE — Patient Outreach (Signed)
[facility] Care Management  [facility]   08/09/2015  Heloise OchoaHaywood N Duty 09/04/1925 [id number]  Subjective:  Phone call with Ervin KnackMikosha, patient's grand-daughter/caregiver who had called to complete discussion about patient assistance for patient's medications.   Since patient lost his Medicare Part D coverage she is helping patient to obtain his medication by paying cash and is looking for ways to identify cost savings.    Per her report patient is not eligible for SSA Extra Help and she reports some prescriptions are expensive.  She states she obtained some of his medications at [facility] off the generic discount list.    She is interested in cost effective ways to fill patient's medications until he can re-enroll in Medicare Part D for 2018.   Objective:   Current Medications: Current Outpatient Prescriptions  Medication Sig Dispense Refill  . acetaminophen (TYLENOL) 500 MG tablet Take 1 tablet (500 mg total) by mouth every 8 (eight) hours as needed (pain). 30 tablet 0  . amLODipine (NORVASC) 10 MG tablet Take 10 mg by mouth at bedtime.     Marland Kitchen. aspirin 81 MG tablet Take 81 mg by mouth daily.    . cetirizine (ZYRTEC) 10 MG tablet Take 10 mg by mouth daily.    . colchicine 0.6 MG tablet Take 0.6 mg by mouth 2 (two) times daily as needed (gout).    . Dextromethorphan-Guaifenesin (CORICIDIN HBP CONGESTION/COUGH) 10-200 MG CAPS Take 1 capsule by mouth daily as needed (for cold). Reported on 06/29/2015    . dorzolamide-timolol (COSOPT) 22.3-6.8 MG/ML ophthalmic solution Place 1 drop into both eyes 2 (two) times daily.  6  . dorzolamide-timolol (COSOPT) 22.3-6.8 MG/ML ophthalmic solution Place 1 drop into both eyes 2 (two) times daily. 10 mL 12  . famotidine (PEPCID AC) 10 MG chewable tablet Chew 10 mg by mouth daily as needed for heartburn.     . finasteride (PROSCAR) 5 MG tablet TAKE 1 TABLET (5 MG TOTAL) BY MOUTH DAILY.  5  . fluticasone (FLONASE) 50 MCG/ACT nasal spray  Place 2 sprays into both nostrils daily.    . furosemide (LASIX) 20 MG tablet Take 1 tablet (20 mg total) by mouth daily. 30 tablet 11  . hyoscyamine (LEVSIN/SL) 0.125 MG SL tablet Place 1 tablet (0.125 mg total) under the tongue every 4 (four) hours as needed for cramping. 30 tablet 6  . latanoprost (XALATAN) 0.005 % ophthalmic solution Place 1 drop into both eyes at bedtime.     . levETIRAcetam (KEPPRA) 500 MG tablet Take 1/2 tablet (250 mg) by mouth every morning and 1 tablet (500 mg) every night    . levothyroxine (SYNTHROID, LEVOTHROID) 25 MCG tablet TAKE 1 TABLET (25 MCG TOTAL) BY MOUTH DAILY BEFORE BREAKFAST. 90 tablet 1  . mirabegron ER (MYRBETRIQ) 25 MG TB24 tablet Take 25 mg by mouth daily. Reported on 06/29/2015    . Multiple Vitamin (MULTIVITAMIN WITH MINERALS) TABS tablet Take 1 tablet by mouth daily.    . polyethylene glycol (MIRALAX / GLYCOLAX) packet Take 17 g by mouth at bedtime. 14 each 0  . senna-docusate (SENOKOT-S) 8.6-50 MG tablet Take 2 tablets by mouth at bedtime. 60 tablet 0  . tamsulosin (FLOMAX) 0.4 MG CAPS capsule Take 1 capsule by mouth once daily  5  . traMADol-acetaminophen (ULTRACET) 37.5-325 MG tablet Take 1 tablet by mouth every 6 (six) hours as needed for severe pain. 30 tablet 0  . valsartan (DIOVAN) 320 MG tablet Take 1 tablet (320 mg total) by  mouth daily with lunch. 30 tablet 11  . vitamin B-12 (CYANOCOBALAMIN) 1000 MCG tablet Take 1,000 mcg by mouth daily.    Marland Kitchen. zolpidem (AMBIEN) 5 MG tablet Take 5 mg by mouth at bedtime.     No current facility-administered medications for this visit.    Functional Status: In your present state of health, do you have any difficulty performing the following activities: 05/23/2015 05/07/2015  Hearing? N Y  Vision? N N  Difficulty concentrating or making decisions? N N  Walking or climbing stairs? Y Y  Dressing or bathing? Y N  Doing errands, shopping? Malvin JohnsY Y  Preparing Food and eating ? Y -  Using the Toilet? Y -  In the  past six months, have you accidently leaked urine? Y -  Do you have problems with loss of bowel control? N -  Managing your Medications? N -  Managing your Finances? Y -  Housekeeping or managing your Housekeeping? Y -    Fall/Depression Screening: PHQ 2/9 Scores 05/23/2015 11/28/2014 04/12/2014  PHQ - 2 Score 0 0 0    Assessment:  Since patient is on mostly generic medications, manufacturer patient assistance options are limited.  Grand-daughter is already aware of patient assistance application for [employer] and prefers to complete it and submit it on her own.   Discussed rxoutreach---it is a generic mail order pharmacy which may offer some cost savings for patient.   [facility]    Discussed how to see which medications are available at this time as well as cost associated with those medications on the [employer] website as well as [facility] generic discount list.    Grand-daughter reports she will decide which medications she wishes to get for patient from [facility] and which medications locally at [facility] or [facility] based on potential cost savings.   Advised her new prescriptions may be needed from patient's prescriber and she verbalized understanding.    Plan:  Grand-daughter denies further pharmacy needs at this time and states she plans to review where refills will be least expensive for patient.  She was appreciative of the help and is aware she can contact [facility] Pharmacist if needed in the future.   Will send MD closure letter and close out case per grand-daughter reporting no further needs at this time.   Tommye StandardKevin Lizann Edelman, PharmD, [facility]BCACP Clinical Pharmacist [facility] [phone]

## 2015-08-21 ENCOUNTER — Other Ambulatory Visit: Payer: Self-pay | Admitting: Internal Medicine

## 2015-08-23 ENCOUNTER — Telehealth: Payer: Self-pay | Admitting: Family Medicine

## 2015-08-23 NOTE — Telephone Encounter (Signed)
Pt granddaughter would like to see if you would give pt a prescription or can she give pt OTC medication for pin wombs?

## 2015-08-24 MED ORDER — MEBENDAZOLE 100 MG PO CHEW: 100.0000 mg | CHEWABLE_TABLET | Freq: Every day | ORAL | Status: DC

## 2015-08-24 NOTE — Telephone Encounter (Signed)
Mebendazole 100 mg po qd for 3 days.

## 2015-08-24 NOTE — Telephone Encounter (Signed)
Rx done and I called the pts daughter and left a detailed message with this information at her cell number.

## 2015-09-10 ENCOUNTER — Ambulatory Visit (INDEPENDENT_AMBULATORY_CARE_PROVIDER_SITE_OTHER): Payer: Medicare Other | Admitting: Family Medicine

## 2015-09-10 ENCOUNTER — Encounter: Payer: Self-pay | Admitting: Family Medicine

## 2015-09-10 VITALS — BP 128/82 | HR 64 | Temp 97.8°F | Ht 70.0 in | Wt 186.9 lb

## 2015-09-10 DIAGNOSIS — I6523 Occlusion and stenosis of bilateral carotid arteries: Secondary | ICD-10-CM

## 2015-09-10 DIAGNOSIS — J309 Allergic rhinitis, unspecified: Secondary | ICD-10-CM

## 2015-09-10 DIAGNOSIS — R110 Nausea: Secondary | ICD-10-CM | POA: Diagnosis not present

## 2015-09-10 MED ORDER — FLUTICASONE PROPIONATE 50 MCG/ACT NA SUSP: 2.0000 | Freq: Every day | NASAL | 0 refills | Status: AC

## 2015-09-10 NOTE — Patient Instructions (Addendum)
BEFORE YOU LEAVE: -follow up: with Dr. Caryl NeverBurchette in 2-4 weeks  Allergies: -claritin daily -flonase 2 sprays each nostril daily for 3 weeks, then 1 spray each nostril daily  Eat 3 small meals daily. Take temperature if you feel sick. Follow up with your doctor if vomiting, fevers, abdominal pain, worsening over the next week instead of improving.

## 2015-09-10 NOTE — Progress Notes (Signed)
Pre visit review using our clinic review tool, if applicable. No additional management support is needed unless otherwise documented below in the visit note. 

## 2015-09-10 NOTE — Progress Notes (Signed)
HPI:  Oscar Santiago is a very pleasant 82 yo here for an acute visit for several issues today:  Allergies: -chronic clear rhinorrhea, sneezes occ, pnd -take claritin or zyrtec sporadically, uses flonase occ -nose runs when temperature changes -denies: SOB, fevers, productive cough, sinus pain, worsening  Nausea: -for 2 days, today better -denies: abd pain, vomiting, change in bowels, fevers, SOB, CP, melena, hematochezia -eating and drinking fine - reports feeling better today  ? RLS: -daughter on conference call after visit and reports sometimes kicks legs at night the last few nights -usually sleeps well though -report they want visit with PCP but is very difficult to get appt   ROS: See pertinent positives and negatives per HPI.  Past Medical History:  Diagnosis Date  . Chronic systolic dysfunction of left ventricle    EF 30-35% by echo 10/10/2009  . CKD (chronic kidney disease), stage III   . Complete heart block (HCC)    a. s/p MDT single chamber PPM   . Compression fracture of spine (HCC)    T 10 and T11  . Glaucoma   . Glaucoma   . Gout   . Hearing loss    bilateral -hearing aids  . Hemorrhoid   . History of GI bleed    from coumadin  . Hyperlipidemia   . Hypertension   . Hypothyroid   . Inguinal hernia   . Kidney stone   . Major depression (HCC)   . MR (mitral regurgitation)   . OSA (obstructive sleep apnea)    on CPAP therapy  . Osteopenia   . Permanent atrial fibrillation (HCC)    refuses coumadin due to history of GI bleed  . Seizure disorder (HCC)   . Stroke [facility])     Past Surgical History:  Procedure Laterality Date  . CARDIAC CATHETERIZATION  2002  . COLONOSCOPY WITH PROPOFOL N/A 05/01/2014   Procedure: COLONOSCOPY WITH PROPOFOL;  Surgeon: [name], MD;  [facility]: [facility];  Service: Endoscopy;  Laterality: N/A;  . ENDARTERECTOMY Right 10/23/2014   Procedure: ENDARTERECTOMY CAROTID;  Surgeon: Larina Earthly, MD;  Location: [facility] OR;   Service: Vascular;  Laterality: Right;  . EP IMPLANTABLE DEVICE N/A 12/26/2014   Procedure: PPM Generator Changeout;  Surgeon: Marinus Maw, MD;  Location: [facility] INVASIVE CV LAB;  Service: Cardiovascular;  Laterality: N/A;  . HEMORRHOID SURGERY    . INGUINAL HERNIA REPAIR    . INSERTION OF SUPRAPUBIC CATHETER N/A 04/05/2015   Procedure: INSERTION OF SUPRAPUBIC CATHETER;  Surgeon: Jethro Bolus, MD;  Location: [facility] ORS;  Service: Urology;  Laterality: N/A;  . PACEMAKER INSERTION  07/28/06   VVI pacemaker for complete heart block by Dr Amil Amen  . TRANSURETHRAL RESECTION OF PROSTATE    . TRANSURETHRAL RESECTION OF PROSTATE N/A 04/05/2015   Procedure: TRANSURETHRAL RESECTION OF THE PROSTATE (TURP);  Surgeon: Jethro Bolus, MD;  Location: [facility]S;  Service: Urology;  Laterality: N/A;    Family History  Problem Relation Age of Onset  . Heart failure Mother   . CVA Sister   . Aneurysm Brother     brain  . CVA Brother   . Colon cancer Neg Hx   . Colon polyps Neg Hx   . Transient ischemic attack Daughter     Social History   Social History  . Marital status: Widowed    Spouse name: N/A  . Number of children: 6  . Years of education: N/A   Occupational History  . Retired  Pastor   Social History Main Topics  . Smoking status: Never Smoker  . Smokeless tobacco: Never Used  . Alcohol use No  . Drug use: No  . Sexual activity: Not Asked   Other Topics Concern  . 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 Katrinka BlazingSmith - Cardiology   Dr. Leone PayorGessner - Gastroenterology   Dr. Brunilda PayorNesi - Urology   [facility]     Current Outpatient Prescriptions:  .  acetaminophen (TYLENOL) 500 MG tablet, Take 1 tablet (500 mg total) by mouth every 8 (eight) hours as needed (pain)., Disp: 30 tablet, Rfl: 0 .  amLODipine (NORVASC) 10 MG tablet, Take 10 mg by  mouth at bedtime. , Disp: , Rfl:  .  aspirin 81 MG tablet, Take 81 mg by mouth daily., Disp: , Rfl:  .  cetirizine (ZYRTEC) 10 MG tablet, Take 10 mg by mouth daily., Disp: , Rfl:  .  colchicine 0.6 MG tablet, Take 0.6 mg by mouth 2 (two) times daily as needed (gout)., Disp: , Rfl:  .  Dextromethorphan-Guaifenesin (CORICIDIN HBP CONGESTION/COUGH) 10-200 MG CAPS, Take 1 capsule by mouth daily as needed (for cold). Reported on 06/29/2015, Disp: , Rfl:  .  dorzolamide-timolol (COSOPT) 22.3-6.8 MG/ML ophthalmic solution, Place 1 drop into both eyes 2 (two) times daily., Disp: , Rfl: 6 .  dorzolamide-timolol (COSOPT) 22.3-6.8 MG/ML ophthalmic solution, Place 1 drop into both eyes 2 (two) times daily., Disp: 10 mL, Rfl: 12 .  famotidine (PEPCID AC) 10 MG chewable tablet, Chew 10 mg by mouth daily as needed for heartburn. , Disp: , Rfl:  .  finasteride (PROSCAR) 5 MG tablet, TAKE 1 TABLET (5 MG TOTAL) BY MOUTH DAILY., Disp: , Rfl: 5 .  fluticasone (FLONASE) 50 MCG/ACT nasal spray, Place 2 sprays into both nostrils daily., Disp: , Rfl:  .  furosemide (LASIX) 20 MG tablet, Take 1 tablet (20 mg total) by mouth daily., Disp: 30 tablet, Rfl: 11 .  hyoscyamine (LEVSIN/SL) 0.125 MG SL tablet, Place 1 tablet (0.125 mg total) under the tongue every 4 (four) hours as needed for cramping., Disp: 30 tablet, Rfl: 6 .  latanoprost (XALATAN) 0.005 % ophthalmic solution, Place 1 drop into both eyes at bedtime. , Disp: , Rfl:  .  levETIRAcetam (KEPPRA) 500 MG tablet, Take 1/2 tablet (250 mg) by mouth every morning and 1 tablet (500 mg) every night, Disp: , Rfl:  .  levothyroxine (SYNTHROID, LEVOTHROID) 25 MCG tablet, TAKE ONE TABLET BY MOUTH ONCE DAILY BEFORE BREAKFAST, Disp: 90 tablet, Rfl: 1 .  mebendazole (VERMOX) 100 MG chewable tablet, Chew 1 tablet (100 mg total) by mouth daily., Disp: 3 tablet, Rfl: 0 .  mirabegron ER (MYRBETRIQ) 25 MG TB24 tablet, Take 25 mg by mouth daily. Reported on 06/29/2015, Disp: , Rfl:  .   Multiple Vitamin (MULTIVITAMIN WITH MINERALS) TABS tablet, Take 1 tablet by mouth daily., Disp: , Rfl:  .  polyethylene glycol (MIRALAX / GLYCOLAX) packet, Take 17 g by mouth at bedtime., Disp: 14 each, Rfl: 0 .  senna-docusate (SENOKOT-S) 8.6-50 MG tablet, Take 2 tablets by mouth at bedtime., Disp: 60 tablet, Rfl: 0 .  tamsulosin (FLOMAX) 0.4 MG CAPS capsule, Take 1 capsule by mouth once daily, Disp: , Rfl: 5 .  traMADol-acetaminophen (ULTRACET) 37.5-325 MG tablet, Take 1  tablet by mouth every 6 (six) hours as needed for severe pain., Disp: 30 tablet, Rfl: 0 .  valsartan (DIOVAN) 320 MG tablet, Take 1 tablet (320 mg total) by mouth daily with lunch., Disp: 30 tablet, Rfl: 11 .  vitamin B-12 (CYANOCOBALAMIN) 1000 MCG tablet, Take 1,000 mcg by mouth daily., Disp: , Rfl:  .  zolpidem (AMBIEN) 5 MG tablet, Take 5 mg by mouth at bedtime., Disp: , Rfl:   EXAM:  Vitals:   09/10/15 1406  BP: 128/82  Pulse: 64  Temp: 97.8 F (36.6 C)    Body mass index is 26.82 kg/m.  GENERAL: vitals reviewed and listed above, alert, oriented, appears well hydrated and in no acute distress  HEENT: atraumatic, conjunttiva clear, no obvious abnormalities on inspection of external nose and ears, normal appearance of ear canals and TMs, clear nasal congestion, boggy temperature, mild post oropharyngeal erythema with PND, no tonsillar edema or exudate, no sinus TTP  NECK: no obvious masses on inspection  LUNGS: clear to auscultation bilaterally, no wheezes, rales or rhonchi, good air movement  CV: HRRR, no peripheral edema  MS: moves all extremities without noticeable abnormality  PSYCH: pleasant and cooperative, no obvious depression or anxiety  ASSESSMENT AND PLAN:  Discussed the following assessment and plan:  Allergic rhinitis, unspecified allergic rhinitis type -discussed at length, chronic, not worsening -discussed nasal dripping with temp changes, while and annoyance does not require treatment,  but will start allergy regimen for other symptoms  Nausea without vomiting -now resolving -no findings on exam or alarm symptoms - opted to monitor and advised follow up if new symptoms or does not continue to resolve  Spent a long time with pt and caregiver, then another long conversation with another relative on speaker phone as caregiver wanted them to be updated. She requested eval for RLS. Opted to monitor and have them follow up with PCP about this.  -Patient advised to return or notify a doctor immediately if symptoms worsen or persist or new concerns arise.  Patient Instructions  BEFORE YOU LEAVE: -follow up: with Dr. Caryl Never in 2-4 weeks  Allergies: -claritin daily -flonase 2 sprays each nostril daily for 3 weeks, then 1 spray each nostril daily  Eat 3 small meals daily. Take temperature if you feel sick. Follow up with your doctor if vomiting, fevers, abdominal pain, worsening over the next week instead of improving.     Kriste Basque R., DO

## 2015-09-12 ENCOUNTER — Telehealth: Payer: Self-pay | Admitting: Neurology

## 2015-09-12 ENCOUNTER — Other Ambulatory Visit: Payer: Self-pay

## 2015-09-12 MED ORDER — LEVETIRACETAM 500 MG PO TABS: 500.0000 mg | ORAL_TABLET | Freq: Two times a day (BID) | ORAL | 1 refills | Status: DC

## 2015-09-12 NOTE — Telephone Encounter (Signed)
Granddaughter Ervin KnackMikosha called to request that levETIRAcetam (KEPPRA) 500 MG tablet be sent to [facility] [place], states [facility], [facility].

## 2015-09-12 NOTE — Telephone Encounter (Signed)
Rn call patients granddaughter about the refill for keppra. She stated there is a discount card call blink that she will try to use. Ervin KnackMIkosha will call back if the discount does not work. If the discount card does not work she wants [facility] on [address]. Rn made follow up appt to continue receiving refills.

## 2015-09-13 ENCOUNTER — Telehealth: Payer: Self-pay | Admitting: Interventional Cardiology

## 2015-09-13 NOTE — Telephone Encounter (Signed)
Granddaughter Oscar Santiago(Oscar Santiago) calling concerned about grandfather.  For past several days his BP has been high or low; hasn't really eaten in 3 days (only bites of food); nauseated and states he just doesn't feel good. He is drinking liquids and urinating. His BP this AM was 164/87 HR 57; last pm 167/111 HR 45; earlier in week was 101/57.  She gave him Norvasc 5 mg last PM since diastolic was above 90.  States Sunday she did not give the Valsartan 320 mg because BP was 101/50.  He saw his PCP on Mon and was told everything fine and could not find reason for his nausea.  She states he stays in bed most of the day because he "doesn't feel good". Oscar Bloomerdvised Oscar Santiago that Dr. Katrinka BlazingSmith not in office.  She wanted message sent to him to see what he would recommend. Advised would forward message to him and will let her know his recommendations.

## 2015-09-13 NOTE — Telephone Encounter (Signed)
New message     Pt grandtr want to get an earlier appt than 11/17 offered w/Dr. She declined to see an App. Please call.     Pt c/o BP issue: STAT if pt c/o blurred vision, one-sided weakness or slurred speech  1. What are your last 5 BP readings? This morning 164/87 hr 57, last night 161/111 hr 45  2. Are you having any other symptoms (ex. Dizziness, headache, blurred vision, passed out)? Feels bad, rough, has a history of dizzy spells  3. What is your BP issue? Not sleeping or eating

## 2015-09-14 ENCOUNTER — Telehealth: Payer: Self-pay | Admitting: Internal Medicine

## 2015-09-14 NOTE — Telephone Encounter (Signed)
Mikosa "Junious DresserConnie" patients granddaughter is of Dr.Smith's response with verbal understanding.

## 2015-09-14 NOTE — Telephone Encounter (Signed)
New message   Pt niece verbalized that she wants pt device checked, she sent transmit and want to know if it is okay   1. Has your device fired? no 2. Is you device beeping? no  3. Are you experiencing draining or swelling at device site? no  4. Are you calling to see if we received your device transmission? yes  5. Have you passed out? No     Also she is concerned of pt hr  Patient c/o Palpitations:  High priority if patient c/o lightheadedness and shortness of breath.  1. How long have you been having palpitations? 09/09/15  2. Are you currently experiencing lightheadedness and shortness of breath? no  3. Have you checked your BP and heart rate? (document readings) 155/98   Hr 42     141/92    Hr   47      164/87     Hr 52  4. Are you experiencing any other symptoms? tired

## 2015-09-14 NOTE — Telephone Encounter (Signed)
I don't believe the blood pressure and the other complaints are related. This seems more internal medicine/general medicine related. I'll have any particular recommendations. May get a nutritional supplement if he is not eating. They need to be careful with volume. Need to limit salt in the diet.

## 2015-09-14 NOTE — Telephone Encounter (Signed)
Transmission received and reviewed. Threshold, sensing, impedance stable. No HRs recorded by device < 60bpm. I explained that the HR readings from his home BP cuff will be inaccurate due to his chronic AFib. She is still concerned about his lethargy/fatigue. Denies any syncope. She is continuing to follow up with internal medicine since labs were drawn this week.  I will route back to triage if there are any other recommendations from general cards.

## 2015-09-15 ENCOUNTER — Inpatient Hospital Stay (HOSPITAL_COMMUNITY)
Admission: EM | Admit: 2015-09-15 | Discharge: 2015-09-21 | DRG: 251 | Disposition: A | Discharge status: Home Health | Payer: Medicare Other | Attending: Cardiovascular Disease | Admitting: Cardiovascular Disease

## 2015-09-15 ENCOUNTER — Encounter (HOSPITAL_COMMUNITY): Payer: Self-pay | Admitting: Emergency Medicine

## 2015-09-15 ENCOUNTER — Emergency Department (HOSPITAL_COMMUNITY): Payer: Medicare Other

## 2015-09-15 ENCOUNTER — Encounter (HOSPITAL_COMMUNITY)
Admission: EM | Disposition: A | Discharge status: Home Health | Payer: Self-pay | Source: Home / Self Care | Attending: Cardiovascular Disease

## 2015-09-15 DIAGNOSIS — R443 Hallucinations, unspecified: Secondary | ICD-10-CM | POA: Diagnosis not present

## 2015-09-15 DIAGNOSIS — I5022 Chronic systolic (congestive) heart failure: Secondary | ICD-10-CM | POA: Diagnosis present

## 2015-09-15 DIAGNOSIS — I2119 ST elevation (STEMI) myocardial infarction involving other coronary artery of inferior wall: Principal | ICD-10-CM | POA: Diagnosis present

## 2015-09-15 DIAGNOSIS — I2109 ST elevation (STEMI) myocardial infarction involving other coronary artery of anterior wall: Secondary | ICD-10-CM | POA: Diagnosis present

## 2015-09-15 DIAGNOSIS — Z7951 Long term (current) use of inhaled steroids: Secondary | ICD-10-CM

## 2015-09-15 DIAGNOSIS — I213 ST elevation (STEMI) myocardial infarction of unspecified site: Secondary | ICD-10-CM

## 2015-09-15 DIAGNOSIS — I482 Chronic atrial fibrillation, unspecified: Secondary | ICD-10-CM

## 2015-09-15 DIAGNOSIS — M109 Gout, unspecified: Secondary | ICD-10-CM | POA: Diagnosis present

## 2015-09-15 DIAGNOSIS — D638 Anemia in other chronic diseases classified elsewhere: Secondary | ICD-10-CM | POA: Diagnosis present

## 2015-09-15 DIAGNOSIS — R059 Cough, unspecified: Secondary | ICD-10-CM

## 2015-09-15 DIAGNOSIS — N183 Chronic kidney disease, stage 3 (moderate): Secondary | ICD-10-CM | POA: Diagnosis present

## 2015-09-15 DIAGNOSIS — D696 Thrombocytopenia, unspecified: Secondary | ICD-10-CM | POA: Diagnosis present

## 2015-09-15 DIAGNOSIS — R112 Nausea with vomiting, unspecified: Secondary | ICD-10-CM | POA: Diagnosis not present

## 2015-09-15 DIAGNOSIS — I2129 ST elevation (STEMI) myocardial infarction involving other sites: Secondary | ICD-10-CM

## 2015-09-15 DIAGNOSIS — N400 Enlarged prostate without lower urinary tract symptoms: Secondary | ICD-10-CM | POA: Diagnosis present

## 2015-09-15 DIAGNOSIS — N179 Acute kidney failure, unspecified: Secondary | ICD-10-CM | POA: Diagnosis present

## 2015-09-15 DIAGNOSIS — I130 Hypertensive heart and chronic kidney disease with heart failure and stage 1 through stage 4 chronic kidney disease, or unspecified chronic kidney disease: Secondary | ICD-10-CM | POA: Diagnosis present

## 2015-09-15 DIAGNOSIS — D649 Anemia, unspecified: Secondary | ICD-10-CM | POA: Diagnosis not present

## 2015-09-15 DIAGNOSIS — K920 Hematemesis: Secondary | ICD-10-CM | POA: Diagnosis not present

## 2015-09-15 DIAGNOSIS — I429 Cardiomyopathy, unspecified: Secondary | ICD-10-CM | POA: Diagnosis present

## 2015-09-15 DIAGNOSIS — Z515 Encounter for palliative care: Secondary | ICD-10-CM | POA: Diagnosis present

## 2015-09-15 DIAGNOSIS — R6881 Early satiety: Secondary | ICD-10-CM | POA: Diagnosis present

## 2015-09-15 DIAGNOSIS — J301 Allergic rhinitis due to pollen: Secondary | ICD-10-CM | POA: Diagnosis not present

## 2015-09-15 DIAGNOSIS — R05 Cough: Secondary | ICD-10-CM

## 2015-09-15 DIAGNOSIS — I472 Ventricular tachycardia: Secondary | ICD-10-CM | POA: Diagnosis not present

## 2015-09-15 DIAGNOSIS — D509 Iron deficiency anemia, unspecified: Secondary | ICD-10-CM | POA: Diagnosis present

## 2015-09-15 DIAGNOSIS — Z66 Do not resuscitate: Secondary | ICD-10-CM | POA: Diagnosis present

## 2015-09-15 DIAGNOSIS — G40909 Epilepsy, unspecified, not intractable, without status epilepticus: Secondary | ICD-10-CM | POA: Diagnosis present

## 2015-09-15 DIAGNOSIS — Z8249 Family history of ischemic heart disease and other diseases of the circulatory system: Secondary | ICD-10-CM

## 2015-09-15 DIAGNOSIS — R008 Other abnormalities of heart beat: Secondary | ICD-10-CM | POA: Diagnosis present

## 2015-09-15 DIAGNOSIS — K921 Melena: Secondary | ICD-10-CM | POA: Diagnosis not present

## 2015-09-15 DIAGNOSIS — K529 Noninfective gastroenteritis and colitis, unspecified: Secondary | ICD-10-CM | POA: Diagnosis present

## 2015-09-15 DIAGNOSIS — E785 Hyperlipidemia, unspecified: Secondary | ICD-10-CM | POA: Diagnosis present

## 2015-09-15 DIAGNOSIS — Z950 Presence of cardiac pacemaker: Secondary | ICD-10-CM

## 2015-09-15 DIAGNOSIS — R410 Disorientation, unspecified: Secondary | ICD-10-CM | POA: Diagnosis present

## 2015-09-15 DIAGNOSIS — I2510 Atherosclerotic heart disease of native coronary artery without angina pectoris: Secondary | ICD-10-CM | POA: Diagnosis not present

## 2015-09-15 DIAGNOSIS — R0982 Postnasal drip: Secondary | ICD-10-CM | POA: Diagnosis not present

## 2015-09-15 DIAGNOSIS — Z888 Allergy status to other drugs, medicaments and biological substances status: Secondary | ICD-10-CM

## 2015-09-15 DIAGNOSIS — I081 Rheumatic disorders of both mitral and tricuspid valves: Secondary | ICD-10-CM | POA: Diagnosis present

## 2015-09-15 DIAGNOSIS — Z8719 Personal history of other diseases of the digestive system: Secondary | ICD-10-CM

## 2015-09-15 DIAGNOSIS — G4733 Obstructive sleep apnea (adult) (pediatric): Secondary | ICD-10-CM | POA: Diagnosis present

## 2015-09-15 DIAGNOSIS — E039 Hypothyroidism, unspecified: Secondary | ICD-10-CM | POA: Diagnosis present

## 2015-09-15 DIAGNOSIS — R058 Other specified cough: Secondary | ICD-10-CM

## 2015-09-15 DIAGNOSIS — Z974 Presence of external hearing-aid: Secondary | ICD-10-CM

## 2015-09-15 DIAGNOSIS — H409 Unspecified glaucoma: Secondary | ICD-10-CM | POA: Diagnosis present

## 2015-09-15 DIAGNOSIS — R630 Anorexia: Secondary | ICD-10-CM | POA: Diagnosis present

## 2015-09-15 DIAGNOSIS — R319 Hematuria, unspecified: Secondary | ICD-10-CM | POA: Diagnosis present

## 2015-09-15 DIAGNOSIS — H9193 Unspecified hearing loss, bilateral: Secondary | ICD-10-CM | POA: Diagnosis present

## 2015-09-15 DIAGNOSIS — Z823 Family history of stroke: Secondary | ICD-10-CM

## 2015-09-15 DIAGNOSIS — R072 Precordial pain: Secondary | ICD-10-CM | POA: Diagnosis present

## 2015-09-15 DIAGNOSIS — I519 Heart disease, unspecified: Secondary | ICD-10-CM

## 2015-09-15 DIAGNOSIS — Z7982 Long term (current) use of aspirin: Secondary | ICD-10-CM

## 2015-09-15 DIAGNOSIS — Z8673 Personal history of transient ischemic attack (TIA), and cerebral infarction without residual deficits: Secondary | ICD-10-CM

## 2015-09-15 HISTORY — DX: Disorder of arteries and arterioles, unspecified: I77.9

## 2015-09-15 HISTORY — DX: Peripheral vascular disease, unspecified: I73.9

## 2015-09-15 HISTORY — PX: CARDIAC CATHETERIZATION: SHX172

## 2015-09-15 LAB — DIFFERENTIAL
BASOS ABS: 0 10*3/uL (ref 0.0–0.1)
BASOS PCT: 1 %
EOS ABS: 0.2 10*3/uL (ref 0.0–0.7)
Eosinophils Relative: 6 %
Lymphocytes Relative: 53 %
Lymphs Abs: 2 10*3/uL (ref 0.7–4.0)
MONO ABS: 0.3 10*3/uL (ref 0.1–1.0)
MONOS PCT: 9 %
Neutro Abs: 1.2 10*3/uL — ABNORMAL LOW (ref 1.7–7.7)
Neutrophils Relative %: 31 %

## 2015-09-15 LAB — I-STAT CHEM 8, ED
BUN: 21 mg/dL — AB (ref 6–20)
CHLORIDE: 105 mmol/L (ref 101–111)
CREATININE: 1.4 mg/dL — AB (ref 0.61–1.24)
Calcium, Ion: 1.05 mmol/L — ABNORMAL LOW (ref 1.12–1.23)
Glucose, Bld: 123 mg/dL — ABNORMAL HIGH (ref 65–99)
HEMATOCRIT: 39 % (ref 39.0–52.0)
HEMOGLOBIN: 13.3 g/dL (ref 13.0–17.0)
POTASSIUM: 4.4 mmol/L (ref 3.5–5.1)
Sodium: 137 mmol/L (ref 135–145)
TCO2: 20 mmol/L (ref 0–100)

## 2015-09-15 LAB — I-STAT TROPONIN, ED: Troponin i, poc: 0.04 ng/mL (ref 0.00–0.08)

## 2015-09-15 LAB — LIPID PANEL
CHOLESTEROL: 132 mg/dL (ref 0–200)
HDL: 38 mg/dL — ABNORMAL LOW (ref >40)
LDL Cholesterol: 83 mg/dL (ref 0–99)
Total CHOL/HDL Ratio: 3.5 RATIO
Triglycerides: 54 mg/dL (ref <150)
VLDL: 11 mg/dL (ref 0–40)

## 2015-09-15 LAB — CBC
HEMATOCRIT: 34.6 % — AB (ref 39.0–52.0)
HEMATOCRIT: 32.5 % — AB (ref 39.0–52.0)
HEMOGLOBIN: 10.7 g/dL — AB (ref 13.0–17.0)
Hemoglobin: 11.4 g/dL — ABNORMAL LOW (ref 13.0–17.0)
MCH: 25.1 pg — ABNORMAL LOW (ref 26.0–34.0)
MCH: 25.5 pg — AB (ref 26.0–34.0)
MCHC: 32.9 g/dL (ref 30.0–36.0)
MCHC: 32.9 g/dL (ref 30.0–36.0)
MCV: 76.2 fL — ABNORMAL LOW (ref 78.0–100.0)
MCV: 77.4 fL — AB (ref 78.0–100.0)
PLATELETS: 156 10*3/uL (ref 150–400)
Platelets: 136 10*3/uL — ABNORMAL LOW (ref 150–400)
RBC: 4.54 MIL/uL (ref 4.22–5.81)
RBC: 4.2 MIL/uL — ABNORMAL LOW (ref 4.22–5.81)
RDW: 18.9 % — AB (ref 11.5–15.5)
RDW: 18.9 % — ABNORMAL HIGH (ref 11.5–15.5)
WBC: 3.7 10*3/uL — ABNORMAL LOW (ref 4.0–10.5)
WBC: 4.6 10*3/uL (ref 4.0–10.5)

## 2015-09-15 LAB — PROTIME-INR
INR: 1.42
PROTHROMBIN TIME: 17.5 s — AB (ref 11.4–15.2)

## 2015-09-15 LAB — COMPREHENSIVE METABOLIC PANEL
ALK PHOS: 82 U/L (ref 38–126)
ALT: 45 U/L (ref 17–63)
ANION GAP: 12 (ref 5–15)
AST: 87 U/L — ABNORMAL HIGH (ref 15–41)
Albumin: 3.9 g/dL (ref 3.5–5.0)
BUN: 18 mg/dL (ref 6–20)
CALCIUM: 9.2 mg/dL (ref 8.9–10.3)
CO2: 18 mmol/L — ABNORMAL LOW (ref 22–32)
CREATININE: 1.46 mg/dL — AB (ref 0.61–1.24)
Chloride: 102 mmol/L (ref 101–111)
GFR, EST AFRICAN AMERICAN: 48 mL/min — AB (ref >60)
GFR, EST NON AFRICAN AMERICAN: 41 mL/min — AB (ref >60)
Glucose, Bld: 126 mg/dL — ABNORMAL HIGH (ref 65–99)
Potassium: 4.2 mmol/L (ref 3.5–5.1)
Sodium: 132 mmol/L — ABNORMAL LOW (ref 135–145)
Total Bilirubin: 1 mg/dL (ref 0.3–1.2)
Total Protein: 8 g/dL (ref 6.5–8.1)

## 2015-09-15 LAB — TROPONIN I
TROPONIN I: 0.03 ng/mL — AB (ref <0.03)
TROPONIN I: 0.29 ng/mL — AB (ref <0.03)

## 2015-09-15 LAB — POCT ACTIVATED CLOTTING TIME
ACTIVATED CLOTTING TIME: 208 s
ACTIVATED CLOTTING TIME: 186 s
Activated Clotting Time: 164 seconds

## 2015-09-15 LAB — MRSA PCR SCREENING: MRSA BY PCR: NEGATIVE

## 2015-09-15 LAB — APTT: APTT: 43 s — AB (ref 24–36)

## 2015-09-15 SURGERY — CORONARY BALLOON ANGIOPLASTY

## 2015-09-15 MED ORDER — SODIUM CHLORIDE 0.9 % IV SOLN: INTRAVENOUS | Status: AC

## 2015-09-15 MED ORDER — IOPAMIDOL (ISOVUE-370) INJECTION 76%
INTRAVENOUS | Status: DC | PRN
  Administered 2015-09-15: 135 mL via INTRAVENOUS

## 2015-09-15 MED ORDER — MORPHINE SULFATE (PF) 2 MG/ML IV SOLN
2.0000 mg | INTRAVENOUS | Status: DC | PRN
  Administered 2015-09-15 (×4): 2 mg via INTRAVENOUS
  Filled 2015-09-15 (×3): qty 1

## 2015-09-15 MED ORDER — DORZOLAMIDE HCL-TIMOLOL MAL 2-0.5 % OP SOLN
1.0000 [drp] | Freq: Two times a day (BID) | OPHTHALMIC | Status: DC
Start: 2015-09-15 — End: 2015-09-15

## 2015-09-15 MED ORDER — SENNOSIDES-DOCUSATE SODIUM 8.6-50 MG PO TABS
2.0000 | ORAL_TABLET | Freq: Every day | ORAL | Status: DC
  Administered 2015-09-17 – 2015-09-20 (×4): 2 via ORAL
  Filled 2015-09-15 (×5): qty 2

## 2015-09-15 MED ORDER — ACETAMINOPHEN 500 MG PO TABS: 500.0000 mg | ORAL_TABLET | Freq: Three times a day (TID) | ORAL | Status: DC | PRN

## 2015-09-15 MED ORDER — NITROGLYCERIN 1 MG/10 ML FOR IR/CATH LAB
INTRA_ARTERIAL | Status: DC | PRN
  Administered 2015-09-15: 200 ug via INTRACORONARY

## 2015-09-15 MED ORDER — NITROGLYCERIN 0.4 MG SL SUBL: 0.4000 mg | SUBLINGUAL_TABLET | SUBLINGUAL | Status: DC | PRN

## 2015-09-15 MED ORDER — CLOPIDOGREL BISULFATE 75 MG PO TABS
75.0000 mg | ORAL_TABLET | Freq: Every day | ORAL | Status: DC
  Administered 2015-09-16 – 2015-09-21 (×6): 75 mg via ORAL
  Filled 2015-09-15 (×6): qty 1

## 2015-09-15 MED ORDER — IOPAMIDOL (ISOVUE-370) INJECTION 76%
INTRAVENOUS | Status: AC
  Filled 2015-09-15: qty 100

## 2015-09-15 MED ORDER — POLYETHYLENE GLYCOL 3350 17 G PO PACK
17.0000 g | PACK | Freq: Every day | ORAL | Status: DC
  Filled 2015-09-15: qty 1

## 2015-09-15 MED ORDER — SODIUM CHLORIDE 0.9 % IV SOLN
10.0000 mL/h | INTRAVENOUS | Status: DC
  Administered 2015-09-15: 20 mL/h via INTRAVENOUS

## 2015-09-15 MED ORDER — ACETAMINOPHEN 325 MG PO TABS: 650.0000 mg | ORAL_TABLET | ORAL | Status: DC | PRN

## 2015-09-15 MED ORDER — LEVETIRACETAM 500 MG PO TABS
500.0000 mg | ORAL_TABLET | Freq: Every day | ORAL | Status: DC
  Administered 2015-09-16 – 2015-09-20 (×5): 500 mg via ORAL
  Filled 2015-09-15 (×5): qty 1

## 2015-09-15 MED ORDER — LIDOCAINE HCL (PF) 1 % IJ SOLN
INTRAMUSCULAR | Status: AC
Start: 2015-09-15 — End: 2015-09-15
  Filled 2015-09-15: qty 30

## 2015-09-15 MED ORDER — LIDOCAINE HCL (PF) 1 % IJ SOLN
INTRAMUSCULAR | Status: DC | PRN
Start: 2015-09-15 — End: 2015-09-15
  Administered 2015-09-15: 15 mL

## 2015-09-15 MED ORDER — SODIUM CHLORIDE 0.9% FLUSH: 3.0000 mL | INTRAVENOUS | Status: DC | PRN

## 2015-09-15 MED ORDER — ASPIRIN 81 MG PO TABS: 81.0000 mg | ORAL_TABLET | Freq: Every day | ORAL | Status: DC

## 2015-09-15 MED ORDER — HEPARIN SODIUM (PORCINE) 5000 UNIT/ML IJ SOLN: 5000.0000 [IU] | Freq: Three times a day (TID) | INTRAMUSCULAR | Status: DC

## 2015-09-15 MED ORDER — FAMOTIDINE IN NACL 20-0.9 MG/50ML-% IV SOLN
20.0000 mg | INTRAVENOUS | Status: DC
  Administered 2015-09-15: 20 mg via INTRAVENOUS
  Filled 2015-09-15: qty 50

## 2015-09-15 MED ORDER — FAMOTIDINE 20 MG PO TABS: 10.0000 mg | ORAL_TABLET | Freq: Every day | ORAL | Status: DC | PRN

## 2015-09-15 MED ORDER — MORPHINE SULFATE (PF) 2 MG/ML IV SOLN
INTRAVENOUS | Status: AC
  Filled 2015-09-15: qty 1

## 2015-09-15 MED ORDER — FINASTERIDE 5 MG PO TABS
5.0000 mg | ORAL_TABLET | Freq: Every day | ORAL | Status: DC
  Administered 2015-09-16 – 2015-09-17 (×2): 5 mg via ORAL
  Filled 2015-09-15 (×3): qty 1

## 2015-09-15 MED ORDER — HYDRALAZINE HCL 20 MG/ML IJ SOLN
10.0000 mg | INTRAMUSCULAR | Status: DC | PRN
  Administered 2015-09-15 – 2015-09-16 (×3): 10 mg via INTRAVENOUS
  Filled 2015-09-15 (×3): qty 1

## 2015-09-15 MED ORDER — LATANOPROST 0.005 % OP SOLN
1.0000 [drp] | Freq: Every day | OPHTHALMIC | Status: DC
  Administered 2015-09-15 – 2015-09-20 (×6): 1 [drp] via OPHTHALMIC
  Filled 2015-09-15: qty 2.5

## 2015-09-15 MED ORDER — ATORVASTATIN CALCIUM 40 MG PO TABS
40.0000 mg | ORAL_TABLET | Freq: Every day | ORAL | Status: DC
  Administered 2015-09-16 – 2015-09-20 (×5): 40 mg via ORAL
  Filled 2015-09-15: qty 2
  Filled 2015-09-15 (×3): qty 1

## 2015-09-15 MED ORDER — ONDANSETRON HCL 4 MG/2ML IJ SOLN
4.0000 mg | Freq: Four times a day (QID) | INTRAMUSCULAR | Status: DC | PRN
  Administered 2015-09-15: 4 mg via INTRAVENOUS
  Filled 2015-09-15: qty 2

## 2015-09-15 MED ORDER — BIVALIRUDIN 250 MG IV SOLR
INTRAVENOUS | Status: AC
  Filled 2015-09-15: qty 250

## 2015-09-15 MED ORDER — FLUTICASONE PROPIONATE 50 MCG/ACT NA SUSP
2.0000 | Freq: Every day | NASAL | Status: DC
  Administered 2015-09-16 – 2015-09-21 (×6): 2 via NASAL
  Filled 2015-09-15: qty 16

## 2015-09-15 MED ORDER — NITROGLYCERIN 0.4 MG SL SUBL
0.4000 mg | SUBLINGUAL_TABLET | SUBLINGUAL | Status: DC | PRN
  Administered 2015-09-15 (×2): 0.4 mg via SUBLINGUAL
  Filled 2015-09-15: qty 1

## 2015-09-15 MED ORDER — LEVETIRACETAM 500 MG PO TABS: 500.0000 mg | ORAL_TABLET | Freq: Two times a day (BID) | ORAL | Status: DC

## 2015-09-15 MED ORDER — LEVETIRACETAM 250 MG PO TABS
250.0000 mg | ORAL_TABLET | Freq: Every day | ORAL | Status: DC
  Administered 2015-09-16 – 2015-09-21 (×6): 250 mg via ORAL
  Filled 2015-09-15 (×6): qty 1

## 2015-09-15 MED ORDER — ONDANSETRON HCL 4 MG/2ML IJ SOLN: 4.0000 mg | Freq: Four times a day (QID) | INTRAMUSCULAR | Status: DC | PRN

## 2015-09-15 MED ORDER — SODIUM CHLORIDE 0.9 % IV SOLN: 250.0000 mL | INTRAVENOUS | Status: DC | PRN

## 2015-09-15 MED ORDER — ZOLPIDEM TARTRATE 5 MG PO TABS
5.0000 mg | ORAL_TABLET | Freq: Every day | ORAL | Status: DC
  Administered 2015-09-16 – 2015-09-20 (×5): 5 mg via ORAL
  Filled 2015-09-15 (×5): qty 1

## 2015-09-15 MED ORDER — NITROGLYCERIN 1 MG/10 ML FOR IR/CATH LAB
INTRA_ARTERIAL | Status: AC
Start: 2015-09-15 — End: 2015-09-15
  Filled 2015-09-15: qty 10

## 2015-09-15 MED ORDER — SODIUM CHLORIDE 0.9 % IV SOLN
500.0000 mg | Freq: Once | INTRAVENOUS | Status: AC
  Administered 2015-09-15: 500 mg via INTRAVENOUS
  Filled 2015-09-15: qty 5

## 2015-09-15 MED ORDER — CLOPIDOGREL BISULFATE 300 MG PO TABS
ORAL_TABLET | ORAL | Status: AC
  Filled 2015-09-15: qty 2

## 2015-09-15 MED ORDER — MORPHINE SULFATE (PF) 2 MG/ML IV SOLN
2.0000 mg | Freq: Once | INTRAVENOUS | Status: AC
Start: 2015-09-15 — End: 2015-09-15
  Administered 2015-09-15: 2 mg via INTRAVENOUS
  Filled 2015-09-15: qty 1

## 2015-09-15 MED ORDER — SODIUM CHLORIDE 0.9 % IV SOLN
INTRAVENOUS | Status: DC | PRN
  Administered 2015-09-15: 1.75 mg/kg/h via INTRAVENOUS

## 2015-09-15 MED ORDER — HEPARIN SODIUM (PORCINE) 5000 UNIT/ML IJ SOLN
5000.0000 [IU] | INTRAMUSCULAR | Status: AC
  Administered 2015-09-15: 5000 [IU] via INTRAVENOUS
  Filled 2015-09-15: qty 1

## 2015-09-15 MED ORDER — HEPARIN (PORCINE) IN NACL 2-0.9 UNIT/ML-% IJ SOLN
INTRAMUSCULAR | Status: DC | PRN
  Administered 2015-09-15: 1500 mL

## 2015-09-15 MED ORDER — ONDANSETRON HCL 4 MG/2ML IJ SOLN
4.0000 mg | INTRAMUSCULAR | Status: DC | PRN
  Administered 2015-09-15 – 2015-09-16 (×5): 4 mg via INTRAVENOUS
  Filled 2015-09-15 (×5): qty 2

## 2015-09-15 MED ORDER — LORATADINE 10 MG PO TABS
10.0000 mg | ORAL_TABLET | Freq: Every day | ORAL | Status: DC
  Administered 2015-09-16 – 2015-09-21 (×6): 10 mg via ORAL
  Filled 2015-09-15 (×6): qty 1

## 2015-09-15 MED ORDER — AMLODIPINE BESYLATE 5 MG PO TABS
5.0000 mg | ORAL_TABLET | Freq: Every day | ORAL | Status: DC
  Administered 2015-09-16 – 2015-09-18 (×3): 5 mg via ORAL
  Filled 2015-09-15 (×4): qty 1

## 2015-09-15 MED ORDER — ADULT MULTIVITAMIN W/MINERALS CH
1.0000 | ORAL_TABLET | Freq: Every day | ORAL | Status: DC
  Administered 2015-09-16 – 2015-09-21 (×6): 1 via ORAL
  Filled 2015-09-15 (×6): qty 1

## 2015-09-15 MED ORDER — ASPIRIN 81 MG PO CHEW
81.0000 mg | CHEWABLE_TABLET | Freq: Every day | ORAL | Status: DC
Start: 2015-09-15 — End: 2015-09-21
  Administered 2015-09-16 – 2015-09-21 (×6): 81 mg via ORAL
  Filled 2015-09-15 (×6): qty 1

## 2015-09-15 MED ORDER — CLOPIDOGREL BISULFATE 300 MG PO TABS
ORAL_TABLET | ORAL | Status: DC | PRN
  Administered 2015-09-15: 600 mg via ORAL

## 2015-09-15 MED ORDER — HEPARIN (PORCINE) IN NACL 2-0.9 UNIT/ML-% IJ SOLN
INTRAMUSCULAR | Status: AC
Start: 2015-09-15 — End: 2015-09-15
  Filled 2015-09-15: qty 1500

## 2015-09-15 MED ORDER — TRAMADOL-ACETAMINOPHEN 37.5-325 MG PO TABS: 1.0000 | ORAL_TABLET | Freq: Four times a day (QID) | ORAL | Status: DC | PRN

## 2015-09-15 MED ORDER — BIVALIRUDIN BOLUS VIA INFUSION - CUPID
INTRAVENOUS | Status: DC | PRN
  Administered 2015-09-15: 62.925 mg via INTRAVENOUS

## 2015-09-15 MED ORDER — METOPROLOL TARTRATE 12.5 MG HALF TABLET
12.5000 mg | ORAL_TABLET | Freq: Two times a day (BID) | ORAL | Status: DC
  Administered 2015-09-16 (×2): 12.5 mg via ORAL
  Filled 2015-09-15 (×3): qty 1

## 2015-09-15 MED ORDER — LEVOTHYROXINE SODIUM 25 MCG PO TABS
25.0000 ug | ORAL_TABLET | Freq: Every day | ORAL | Status: DC
  Administered 2015-09-16 – 2015-09-21 (×7): 25 ug via ORAL
  Filled 2015-09-15 (×6): qty 1

## 2015-09-15 MED ORDER — SODIUM CHLORIDE 0.9% FLUSH
3.0000 mL | Freq: Two times a day (BID) | INTRAVENOUS | Status: DC
  Administered 2015-09-15: 3 mL via INTRAVENOUS
  Administered 2015-09-16: 10 mL via INTRAVENOUS
  Administered 2015-09-16 – 2015-09-20 (×7): 3 mL via INTRAVENOUS

## 2015-09-15 MED ORDER — DORZOLAMIDE HCL-TIMOLOL MAL 2-0.5 % OP SOLN
1.0000 [drp] | Freq: Two times a day (BID) | OPHTHALMIC | Status: DC
  Administered 2015-09-15 – 2015-09-21 (×12): 1 [drp] via OPHTHALMIC
  Filled 2015-09-15: qty 10

## 2015-09-15 SURGICAL SUPPLY — 15 items
BALLN EMERGE MR 2.0X12 (BALLOONS) ×3
BALLOON EMERGE MR 2.0X12 (BALLOONS) IMPLANT
CATH EXTRAC PRONTO LP 6F RND (CATHETERS) ×2 IMPLANT
CATH INFINITI 5FR JL5 (CATHETERS) ×2 IMPLANT
CATH INFINITI 5FR MULTPACK ANG (CATHETERS) ×2 IMPLANT
CATH VISTA GUIDE 6FR XBLAD4 (CATHETERS) ×2 IMPLANT
CATH VISTA GUIDE 6FRXBLAD4.0SH (CATHETERS) ×2 IMPLANT
KIT ENCORE 26 ADVANTAGE (KITS) ×2 IMPLANT
KIT HEART LEFT (KITS) ×3 IMPLANT
PACK CARDIAC CATHETERIZATION (CUSTOM PROCEDURE TRAY) ×3 IMPLANT
SHEATH PINNACLE 6F 10CM (SHEATH) ×2 IMPLANT
TRANSDUCER W/STOPCOCK (MISCELLANEOUS) ×3 IMPLANT
TUBING CIL FLEX 10 FLL-RA (TUBING) ×3 IMPLANT
WIRE ASAHI PROWATER 180CM (WIRE) ×2 IMPLANT
WIRE EMERALD 3MM-J .035X150CM (WIRE) ×2 IMPLANT

## 2015-09-15 NOTE — Significant Event (Signed)
Dr Mayford Knifeurner notified of patient having a second episode of maroon colored emesis and persisting to complain of midsternal discomfort(pt unable to rate, however states that he is very uncomfortable). New orders obtained.        Jola SchmidtParker, Lizzie Cokley Jeanene

## 2015-09-15 NOTE — H&P (Signed)
Called by nurse due to patient having coffee ground emesis.  Patient was admitted earlier today with possible STEMI.  Cath showed occluded distal LAD felt to possibly be due to cardioembolic event from afib.  Dr. Allyson SabalBerry attempted unsuccessful PCI of distal LAD.  Now on medical management.  Patient has a history of GI bleed in past on coumadin and has not been on anticoagulation and has chronic afib.  Post cath patient became nauseated and vomited 200cc of coffee ground emesis.  Started on IV pepcid 20mg  and made NPO.  Stat CBC sent that is pending.  Now vomited 50cc coffee ground emesis.  Discussed with his GI MD, Dr. Leone PayorGessner, who agrees with following CBC. Patient is hemodynamically stable.  He is complainig of some CP.  Will check a stat Trop.   Will increase Pepcid to 20mg  IV BID, place NG tube, Zofran for nausea.  He will see patient in am unless he starts having bright red blood emesis.  Will stop SQ Heparin and start SCDs.

## 2015-09-15 NOTE — ED Notes (Signed)
Code Stemi called @ 1600

## 2015-09-15 NOTE — ED Provider Notes (Signed)
[facility]-EMERGENCY DEPT Provider Note   CSN: [id number] Arrival date & time: 09/15/15  1555  First Provider Contact:  First MD Initiated Contact with Patient 09/15/15 1558        History   Chief Complaint Chief Complaint  Patient presents with  . Chest Pain    HPI Oscar Santiago is a 82 y.o. male.  The history is provided by the patient. No language interpreter was used.  Chest Pain     Oscar Santiago is a 82 y.o. male who presents to the Emergency Department complaining of chest pain. History is provided by the patient, family, EMS. Family report that he's had one week of generalized malaise and fatigue. About 1 hour prior to ED arrival he developed severe substernal chest pain with diaphoresis. EMS was called. He received 324 aspirin prior to ED arrival. He denies any shortness of breath but does have associated nausea. History of pacemaker, CKD. Symptoms are severe, constant, worsening.  Past Medical History:  Diagnosis Date  . Chronic systolic dysfunction of left ventricle    EF 30-35% by echo 10/10/2009  . CKD (chronic kidney disease), stage III   . Complete heart block (HCC)    a. s/p MDT single chamber PPM   . Compression fracture of spine (HCC)    T 10 and T11  . Glaucoma   . Glaucoma   . Gout   . Hearing loss    bilateral -hearing aids  . Hemorrhoid   . History of GI bleed    from coumadin  . Hyperlipidemia   . Hypertension   . Hypothyroid   . Inguinal hernia   . Kidney stone   . Major depression (HCC)   . MR (mitral regurgitation)   . OSA (obstructive sleep apnea)    on CPAP therapy  . Osteopenia   . Permanent atrial fibrillation (HCC)    refuses coumadin due to history of GI bleed  . Seizure disorder (HCC)   . Stroke [facility])     Patient Active Problem List   Diagnosis Date Noted  . Hyponatremia 05/07/2015  . Anemia of chronic disease 05/07/2015  . Physical deconditioning 05/07/2015  . Benign prostatic hypertrophy 04/05/2015  . Carotid stenosis  01/03/2015  . S/P carotid endarterectomy 01/03/2015  . Retention of urine 11/05/2014  . TIA (transient ischemic attack) 10/20/2014  . Hypothyroidism 10/20/2014  . Preventative health care 04/12/2014  . CKD (chronic kidney disease), stage III 12/19/2013  . Seizure disorder (HCC) 12/19/2013  . Complete heart block (HCC) 03/13/2013  . Chronic systolic dysfunction of left ventricle 03/13/2013  . Cardiac pacemaker in situ 01/05/2013  . Chronic atrial fibrillation (CHADVASc = 5)   . Hypertension   . Hyperlipidemia   . Osteopenia   . Kidney stone   . OSA (obstructive sleep apnea)     Past Surgical History:  Procedure Laterality Date  . CARDIAC CATHETERIZATION  2002  . COLONOSCOPY WITH PROPOFOL N/A 05/01/2014   Procedure: COLONOSCOPY WITH PROPOFOL;  Surgeon: [name], MD;  Location: [facility] ENDOSCOPY;  Service: Endoscopy;  Laterality: N/A;  . ENDARTERECTOMY Right 10/23/2014   Procedure: ENDARTERECTOMY CAROTID;  Surgeon: Larina Earthly, MD;  Location: [facility] OR;  Service: Vascular;  Laterality: Right;  . EP IMPLANTABLE DEVICE N/A 12/26/2014   Procedure: PPM Generator Changeout;  Surgeon: Marinus Maw, MD;  Location: [facility] INVASIVE CV LAB;  Service: Cardiovascular;  Laterality: N/A;  . HEMORRHOID SURGERY    . INGUINAL HERNIA REPAIR    .  INSERTION OF SUPRAPUBIC CATHETER N/A 04/05/2015   Procedure: INSERTION OF SUPRAPUBIC CATHETER;  Surgeon: Jethro Bolus, MD;  Location: [facility] ORS;  Service: Urology;  Laterality: N/A;  . PACEMAKER INSERTION  07/28/06   VVI pacemaker for complete heart block by Dr Amil Amen  . TRANSURETHRAL RESECTION OF PROSTATE    . TRANSURETHRAL RESECTION OF PROSTATE N/A 04/05/2015   Procedure: TRANSURETHRAL RESECTION OF THE PROSTATE (TURP);  Surgeon: Jethro Bolus, MD;  Location: [facility] ORS;  Service: Urology;  Laterality: N/A;       Home Medications    Prior to Admission medications   Medication Sig Start Date End Date Taking? Authorizing Provider  acetaminophen (TYLENOL)  500 MG tablet Take 1 tablet (500 mg total) by mouth every 8 (eight) hours as needed (pain). 11/09/14   Albertine Grates, MD  amLODipine (NORVASC) 10 MG tablet Take 10 mg by mouth at bedtime.     Historical Provider, MD  aspirin 81 MG tablet Take 81 mg by mouth daily.    Historical Provider, MD  cetirizine (ZYRTEC) 10 MG tablet Take 10 mg by mouth daily.    Historical Provider, MD  colchicine 0.6 MG tablet Take 0.6 mg by mouth 2 (two) times daily as needed (gout).    Historical Provider, MD  dorzolamide-timolol (COSOPT) 22.3-6.8 MG/ML ophthalmic solution Place 1 drop into both eyes 2 (two) times daily. 09/09/14   Historical Provider, MD  dorzolamide-timolol (COSOPT) 22.3-6.8 MG/ML ophthalmic solution Place 1 drop into both eyes 2 (two) times daily. 05/10/15   Shanker Levora Dredge, MD  famotidine (PEPCID AC) 10 MG chewable tablet Chew 10 mg by mouth daily as needed for heartburn.  07/18/04   Historical Provider, MD  finasteride (PROSCAR) 5 MG tablet TAKE 1 TABLET (5 MG TOTAL) BY MOUTH DAILY. 03/29/15   Historical Provider, MD  fluticasone (FLONASE) 50 MCG/ACT nasal spray Place 2 sprays into both nostrils daily. 09/10/15   Terressa Koyanagi, DO  furosemide (LASIX) 20 MG tablet Take 1 tablet (20 mg total) by mouth daily. 03/21/15   Lyn Records, MD  hyoscyamine (LEVSIN/SL) 0.125 MG SL tablet Place 1 tablet (0.125 mg total) under the tongue every 4 (four) hours as needed for cramping. 04/06/15   Jethro Bolus, MD  latanoprost (XALATAN) 0.005 % ophthalmic solution Place 1 drop into both eyes at bedtime.  02/05/14   Historical Provider, MD  levETIRAcetam (KEPPRA) 500 MG tablet Take 1 tablet (500 mg total) by mouth 2 (two) times daily. Take 1/2 tablet (250 mg) by mouth every morning and 1 tablet (500 mg) every night 09/12/15   Marvel Plan, MD  levothyroxine (SYNTHROID, LEVOTHROID) 25 MCG tablet TAKE ONE TABLET BY MOUTH ONCE DAILY BEFORE BREAKFAST 08/23/15   Kristian Covey, MD  mebendazole (VERMOX) 100 MG chewable tablet Chew 1 tablet  (100 mg total) by mouth daily. 08/24/15   Kristian Covey, MD  Multiple Vitamin (MULTIVITAMIN WITH MINERALS) TABS tablet Take 1 tablet by mouth daily.    Historical Provider, MD  polyethylene glycol (MIRALAX / GLYCOLAX) packet Take 17 g by mouth at bedtime. 05/10/15   Shanker Levora Dredge, MD  senna-docusate (SENOKOT-S) 8.6-50 MG tablet Take 2 tablets by mouth at bedtime. 05/10/15   Shanker Levora Dredge, MD  tamsulosin (FLOMAX) 0.4 MG CAPS capsule Take 1 capsule by mouth once daily 03/20/15   Historical Provider, MD  traMADol-acetaminophen (ULTRACET) 37.5-325 MG tablet Take 1 tablet by mouth every 6 (six) hours as needed for severe pain. 04/16/15   Jethro Bolus, MD  valsartan (DIOVAN) 320 MG tablet Take 1 tablet (320 mg total) by mouth daily with lunch. 03/23/15   Lyn Records, MD  vitamin B-12 (CYANOCOBALAMIN) 1000 MCG tablet Take 1,000 mcg by mouth daily.    Historical Provider, MD  zolpidem (AMBIEN) 5 MG tablet Take 5 mg by mouth at bedtime.    Historical Provider, MD    Family History Family History  Problem Relation Age of Onset  . Heart failure Mother   . CVA Sister   . Aneurysm Brother     brain  . CVA Brother   . Transient ischemic attack Daughter   . Colon cancer Neg Hx   . Colon polyps Neg Hx     Social History Social History  Substance Use Topics  . Smoking status: Never Smoker  . Smokeless tobacco: Never Used  . Alcohol use No     Allergies   Coumadin [warfarin sodium]; Warfarin; and Lisinopril   Review of Systems Review of Systems  Cardiovascular: Positive for chest pain.  All other systems reviewed and are negative.    Physical Exam Updated Vital Signs BP 143/96   Pulse (!) 40   Temp 97.6 F (36.4 C) (Oral)   Resp (!) 33   Ht 5' 9.5" (1.765 m)   Wt 185 lb (83.9 kg)   SpO2 100%   BMI 26.93 kg/m   Physical Exam  Constitutional: He is oriented to person, place, and time. He appears well-developed and well-nourished. He appears distressed.  HENT:    Head: Normocephalic and atraumatic.  Cardiovascular: Regular rhythm.   No murmur heard. Bradycardiac  Pulmonary/Chest:  Decreased air movement bilateral bases  Abdominal: Soft. There is no tenderness. There is no rebound and no guarding.  Musculoskeletal: He exhibits no edema or tenderness.  Neurological: He is alert and oriented to person, place, and time.  Skin: Skin is warm. He is diaphoretic. There is pallor.  Psychiatric: He has a normal mood and affect. His behavior is normal.  Nursing note and vitals reviewed.    ED Treatments / Results  Labs (all labs ordered are listed, but only abnormal results are displayed) Labs Reviewed  I-STAT CHEM 8, ED - Abnormal; Notable for the following:       Result Value   BUN 21 (*)    Creatinine, Ser 1.40 (*)    Glucose, Bld 123 (*)    Calcium, Ion 1.05 (*)    All other components within normal limits  CBC  DIFFERENTIAL  PROTIME-INR  APTT  COMPREHENSIVE METABOLIC PANEL  TROPONIN I  LIPID PANEL  I-STAT TROPOININ, ED    EKG  EKG Interpretation  Date/Time:  Saturday September 15 2015 15:56:36 EDT Ventricular Rate:  61 PR Interval:    QRS Duration: 155 QT Interval:  449 QTC Calculation: 453 R Axis:   128 Text Interpretation:  Junctional rhythm Nonspecific intraventricular conduction delay Inferior infarct, acute (LCx) Anterior infarct, possibly acute Confirmed by Lincoln Brigham [phone]) on 09/15/2015 4:06:22 PM       Radiology Dg Chest Port 1 View  Result Date: 09/15/2015 CLINICAL DATA:  Chest pain EXAM: PORTABLE CHEST 1 VIEW COMPARISON:  05/07/2015 chest radiograph. FINDINGS: Stable configuration of single lead left subclavian pacemaker with lead tip overlying the coronary sinus. Stable cardiomediastinal silhouette with mild cardiomegaly. No pneumothorax. No pleural effusion. Mild pulmonary edema. IMPRESSION: Mild congestive heart failure. Electronically Signed   By: Delbert Phenix M.D.   On: 09/15/2015 16:13     Procedures Procedures (including critical care  time) CRITICAL CARE Performed by: Tilden FossaElizabeth Josuha Fontanez   Total critical care time: 35 minutes  Critical care time was exclusive of separately billable procedures and treating other patients.  Critical care was necessary to treat or prevent imminent or life-threatening deterioration.  Critical care was time spent personally by me on the following activities: development of treatment plan with patient and/or surrogate as well as nursing, discussions with consultants, evaluation of patient's response to treatment, examination of patient, obtaining history from patient or surrogate, ordering and performing treatments and interventions, ordering and review of laboratory studies, ordering and review of radiographic studies, pulse oximetry and re-evaluation of patient's condition.   Medications Ordered in ED Medications  0.9 %  sodium chloride infusion (20 mL/hr Intravenous New Bag/Given 09/15/15 1611)  nitroGLYCERIN (NITROSTAT) SL tablet 0.4 mg (0.4 mg Sublingual Given 09/15/15 1615)  heparin injection 5,000 Units (5,000 Units Intravenous Given 09/15/15 1609)  morphine 2 MG/ML injection 2 mg (2 mg Intravenous Given 09/15/15 1620)     Initial Impression / Assessment and Plan / ED Course  I have reviewed the triage vital signs and the nursing notes.  Pertinent labs & imaging results that were available during my care of the patient were reviewed by me and considered in my medical decision making (see chart for details).  Clinical Course    Patient here for evaluation of 1 week of fatigue followed by 1 hour of substernal chest pain. Patient is ill-appearing on examination and EKG concerning for acute ST elevation MI. Cath lab activated. The patient received aspirin 324 mg prior to ED arrival. He had no significant improvement in his pain with nitroglycerin. He was provided a dose of morphine for his pain. Patient emergently transferred to the Cath Lab  for further management.  Final Clinical Impressions(s) / ED Diagnoses   Final diagnoses:  ST elevation myocardial infarction (STEMI), unspecified artery [facility](HCC)    New Prescriptions New Prescriptions   No medications on file     Tilden FossaElizabeth Lachanda Buczek, MD 09/16/15 [phone]

## 2015-09-15 NOTE — Significant Event (Signed)
Dr Mayford Knifeurner notified of patient having an episode of vomitting 200ml of coffee ground/maroon colored emesis; also provided with an update regarding current vital sign trends. New orders obtained.        Jola SchmidtParker, Brielyn Bosak Jeanene

## 2015-09-15 NOTE — Progress Notes (Signed)
   09/15/15 1653  Clinical Encounter Type  Visited With Patient and family together;Health care provider  Visit Type Initial;ED;Pre-op  Referral From Nurse  Spiritual Encounters  Spiritual Needs Prayer   Chaplain responded to a code STEMI. Chaplain helped patient's son, brother, and granddaughter transition to the Cath Lab waiting area. Chaplain offered prayer and support. Chaplain introduced spiritual care services. Spiritual care services available as needed.   Alda Ponderdam M Kehaulani Fruin, Chaplain 09/15/15 4:54 PM

## 2015-09-15 NOTE — H&P (Signed)
History & Physical    Patient ID: RUVIM RISKO MRN: [id number], DOB/AGE: 08/30/1925   Admit date: 09/15/2015   Primary Physician: Kristian Covey, MD Primary Cardiologist: Mendel Ryder, MD / J. Allred, MD (EP)  Patient Profile    82 y/o ? with a h/o systolic chf, carotid dzs s/p R CEA, HTN, HL, CVA, and permanent AFib - not on OAC 2/2 h/o GIB, who presents with acute inflat STEMI.  Past Medical History    Past Medical History:  Diagnosis Date  . Carotid arterial disease (HCC)    a. s/p R CEA with residual LICA 80% stenosis pending further eval.  . Chronic systolic dysfunction of left ventricle    a. EF 30-35% by echo 10/10/2009;  b. 10/2014 Echo: EF 40%, m ild LVH, mod MR, sev dil LA, mildly reduced RV fxn, mod TR, PASP .  . CKD (chronic kidney disease), stage III   . Complete heart block (HCC)    a. s/p MDT single chamber PPM - initially placed in 2008 w/ gen change in 2016.  Marland Kitchen Compression fracture of spine (HCC)    T 10 and T11  . Glaucoma   . Glaucoma   . Gout   . Hearing loss    bilateral -hearing aids  . Hemorrhoid   . History of GI bleed    from coumadin  . Hyperlipidemia   . Hypertension   . Hypothyroid   . Inguinal hernia   . Kidney stone   . Major depression (HCC)   . MR (mitral regurgitation)   . OSA (obstructive sleep apnea)    on CPAP therapy  . Osteopenia   . Permanent atrial fibrillation (HCC)    a. refuses coumadin due to history of GI bleed - was prev Rx eliquis->never took.  . Seizure disorder (HCC)   . Stroke [facility])     Past Surgical History:  Procedure Laterality Date  . CARDIAC CATHETERIZATION  2002  . COLONOSCOPY WITH PROPOFOL N/A 05/01/2014   Procedure: COLONOSCOPY WITH PROPOFOL;  Surgeon: [name], MD;  Location: [facility] ENDOSCOPY;  Service: Endoscopy;  Laterality: N/A;  . ENDARTERECTOMY Right 10/23/2014   Procedure: ENDARTERECTOMY CAROTID;  Surgeon: Larina Earthly, MD;  Location: [facility] OR;  Service: Vascular;  Laterality: Right;  . EP  IMPLANTABLE DEVICE N/A 12/26/2014   Procedure: PPM Generator Changeout;  Surgeon: Marinus Maw, MD;  Location: [facility] INVASIVE CV LAB;  Service: Cardiovascular;  Laterality: N/A;  . HEMORRHOID SURGERY    . INGUINAL HERNIA REPAIR    . INSERTION OF SUPRAPUBIC CATHETER N/A 04/05/2015   Procedure: INSERTION OF SUPRAPUBIC CATHETER;  Surgeon: Jethro Bolus, MD;  Location: [facility] ORS;  Service: Urology;  Laterality: N/A;  . PACEMAKER INSERTION  07/28/06   VVI pacemaker for complete heart block by Dr Amil Amen  . TRANSURETHRAL RESECTION OF PROSTATE    . TRANSURETHRAL RESECTION OF PROSTATE N/A 04/05/2015   Procedure: TRANSURETHRAL RESECTION OF THE PROSTATE (TURP);  Surgeon: Jethro Bolus, MD;  Location: [facility] ORS;  Service: Urology;  Laterality: N/A;     Allergies  Allergies  Allergen Reactions  . Coumadin [Warfarin Sodium] Other (See Comments)    GI Bleed   . Warfarin Other (See Comments)    EXCESS BLDG W/COUMADIN EXCESS BLDG W/COUMADIN  . Lisinopril Cough    History of Present Illness    82 y/o ? with a h/o cardiomyopathy and systolic heart failure along with CHB s/p PPM in 2008 with gen change in 2016.  He  also has a h/o HTN, HL, permanent AFib (not on OAC), GIB (when on coumadin), carotid dzs s/p R CEA and pending LICA procedure, and stroke.  He lives locally with his grand daughter.  He does reasonably well but is not particularly active overall.  He was in his usoh until ~ 3 pm today, when he began to experience severe, sscp.  His family called EMS and he was taken to the [facility] ED.  Though his EMS ECG's were not particularly impressive, f/u ECG's here, in the setting of worsening c/p, showed a paced rhythm with significant inferior and lateral ST elevation.  A Code STEMI was activated and he is now undergoing emergent cath.  Home Medications    Prior to Admission medications   Medication Sig Start Date End Date Taking? Authorizing Provider  acetaminophen (TYLENOL) 500 MG tablet Take 1 tablet  (500 mg total) by mouth every 8 (eight) hours as needed (pain). 11/09/14  Yes Albertine Grates, MD  amLODipine (NORVASC) 10 MG tablet Take 5 mg by mouth at bedtime.    Yes Historical Provider, MD  aspirin 81 MG tablet Take 81 mg by mouth daily.   Yes Historical Provider, MD  cetirizine (ZYRTEC) 10 MG tablet Take 10 mg by mouth daily.   Yes Historical Provider, MD  colchicine 0.6 MG tablet Take 0.6 mg by mouth 2 (two) times daily as needed (gout).   Yes Historical Provider, MD  dorzolamide-timolol (COSOPT) 22.3-6.8 MG/ML ophthalmic solution Place 1 drop into both eyes 2 (two) times daily. 09/09/14  Yes Historical Provider, MD  dorzolamide-timolol (COSOPT) 22.3-6.8 MG/ML ophthalmic solution Place 1 drop into both eyes 2 (two) times daily. 05/10/15  Yes Shanker Levora Dredge, MD  famotidine (PEPCID AC) 10 MG chewable tablet Chew 10 mg by mouth daily as needed for heartburn.  07/18/04  Yes Historical Provider, MD  finasteride (PROSCAR) 5 MG tablet TAKE 1 TABLET (5 MG TOTAL) BY MOUTH DAILY. 03/29/15  Yes Historical Provider, MD  fluticasone (FLONASE) 50 MCG/ACT nasal spray Place 2 sprays into both nostrils daily. 09/10/15  Yes Terressa Koyanagi, DO  furosemide (LASIX) 20 MG tablet Take 1 tablet (20 mg total) by mouth daily. 03/21/15  Yes Lyn Records, MD  latanoprost (XALATAN) 0.005 % ophthalmic solution Place 1 drop into both eyes at bedtime.  02/05/14  Yes Historical Provider, MD  levETIRAcetam (KEPPRA) 500 MG tablet Take 1 tablet (500 mg total) by mouth 2 (two) times daily. Take 1/2 tablet (250 mg) by mouth every morning and 1 tablet (500 mg) every night 09/12/15  Yes Marvel Plan, MD  levothyroxine (SYNTHROID, LEVOTHROID) 25 MCG tablet TAKE ONE TABLET BY MOUTH ONCE DAILY BEFORE BREAKFAST 08/23/15  Yes Kristian Covey, MD  Multiple Vitamin (MULTIVITAMIN WITH MINERALS) TABS tablet Take 1 tablet by mouth daily.   Yes Historical Provider, MD  polyethylene glycol (MIRALAX / GLYCOLAX) packet Take 17 g by mouth at bedtime. 05/10/15  Yes  Shanker Levora Dredge, MD  senna-docusate (SENOKOT-S) 8.6-50 MG tablet Take 2 tablets by mouth at bedtime. 05/10/15  Yes Shanker Levora Dredge, MD  traMADol-acetaminophen (ULTRACET) 37.5-325 MG tablet Take 1 tablet by mouth every 6 (six) hours as needed for severe pain. 04/16/15  Yes Jethro Bolus, MD  valsartan (DIOVAN) 320 MG tablet Take 1 tablet (320 mg total) by mouth daily with lunch. 03/23/15  Yes Lyn Records, MD  vitamin B-12 (CYANOCOBALAMIN) 1000 MCG tablet Take 1,000 mcg by mouth daily.   Yes Historical Provider, MD  zolpidem Remus Loffler)  5 MG tablet Take 5 mg by mouth at bedtime.   Yes Historical Provider, MD  hyoscyamine (LEVSIN/SL) 0.125 MG SL tablet Place 1 tablet (0.125 mg total) under the tongue every 4 (four) hours as needed for cramping. 04/06/15   Jethro Bolus, MD  mebendazole (VERMOX) 100 MG chewable tablet Chew 1 tablet (100 mg total) by mouth daily. 08/24/15   Kristian Covey, MD  tamsulosin (FLOMAX) 0.4 MG CAPS capsule Take 1 capsule by mouth once daily 03/20/15   Historical Provider, MD    Family History    Family History  Problem Relation Age of Onset  . Heart failure Mother   . CVA Sister   . Aneurysm Brother     brain  . CVA Brother   . Transient ischemic attack Daughter   . Colon cancer Neg Hx   . Colon polyps Neg Hx     Social History    Social History   Social History  . Marital status: Widowed    Spouse name: N/A  . Number of children: 6  . Years of education: N/A   Occupational History  . Retired     Education officer, environmental   Social History Main Topics  . Smoking status: Never Smoker  . Smokeless tobacco: Never Used  . Alcohol use No  . Drug use: No  . Sexual activity: Not on file   Other Topics Concern  . 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 Katrinka Blazing - Cardiology   Dr. Leone Payor - Gastroenterology   Dr. Brunilda Payor -  Urology   [facility]     Review of Systems    General:  No chills, fever, night sweats or weight changes.  Cardiovascular:  +++ chest pain and dyspnea today. No edema, orthopnea, palpitations, paroxysmal nocturnal dyspnea. Dermatological: No rash, lesions/masses Respiratory: No cough, dyspnea Urologic: No hematuria, dysuria Abdominal:   No nausea, vomiting, diarrhea, bright red blood per rectum, melena, or hematemesis Neurologic:  No visual changes, wkns, changes in mental status. All other systems reviewed and are otherwise negative except as noted above.  Physical Exam    Blood pressure 145/86, pulse 61 temperature 97.6 F (36.4 C), temperature source Oral, resp. rate 14, height 5' 9.5" (1.765 m), weight 185 lb (83.9 kg), SpO2 92 %.  General: Pleasant, NAD Psych: Normal affect. Neuro: Alert and oriented X 3. Moves all extremities spontaneously. HEENT: Normal  Neck: Supple without bruits or JVD. Lungs:  Resp regular and unlabored, CTA. Heart: RRR no s3, s4, 2/6 syst murmur @ llsb toward apex. Abdomen: Soft, non-tender, non-distended, BS + x 4.  Extremities: No clubbing, cyanosis or edema. DP/PT/Radials 2+ and equal bilaterally.  Labs    Troponin [facility] Care Test)  Recent Labs  09/15/15 1611  TROPIPOC 0.04    Recent Labs  09/15/15 1604  TROPONINI 0.03*   Lab Results  Component Value Date   WBC 3.7 (L) 09/15/2015   HGB 13.3 09/15/2015   HCT 39.0 09/15/2015   MCV 76.2 (L) 09/15/2015   PLT 156 09/15/2015    Recent Labs Lab 09/15/15 1604 09/15/15 1613  NA 132* 137  K 4.2 4.4  CL 102 105  CO2 18*  --   BUN 18 21*  CREATININE 1.46* 1.40*  CALCIUM 9.2  --  PROT 8.0  --   BILITOT 1.0  --   ALKPHOS 82  --   ALT 45  --   AST 87*  --   GLUCOSE 126* 123*   Lab Results  Component Value Date   CHOL 159 10/21/2014   HDL 43 10/21/2014   LDLCALC 105 (H) 10/21/2014   TRIG 55 10/21/2014     Radiology Studies    Dg Chest Port 1  View  Result Date: 09/15/2015 CLINICAL DATA:  Chest pain EXAM: PORTABLE CHEST 1 VIEW COMPARISON:  05/07/2015 chest radiograph. FINDINGS: Stable configuration of single lead left subclavian pacemaker with lead tip overlying the coronary sinus. Stable cardiomediastinal silhouette with mild cardiomegaly. No pneumothorax. No pleural effusion. Mild pulmonary edema. IMPRESSION: Mild congestive heart failure. Electronically Signed   By: Delbert PhenixJason A Poff M.D.   On: 09/15/2015 16:13    ECG & Cardiac Imaging    V paced, underlying afib, 2-804mm ST elev in II, III, aVF, V3-V5.  Assessment & Plan    1.  Acute inferolateral STEMI/CAD:  Pt presents with a 1 hr h/o severe sscp unrelieved by ntg, morphine, and ASA.  F/u ecg in ED notable for significant inferior and antlat ST elevation.  Code STEMI called an emergent cath ongoing.  Plan to cont asa.  Add  blocker, statin.  Hold home dose of ARB in setting of mild renal insuff and contrast load with cath/pci. F/u echo.  Eventual cardiac rehab.  2.  Hypertensive Heart Dzs:  Follow.  3.  HL:  Add statin in setting of ACS.  F/u lipids/lfts.  4.  Cerebrovascular dzs:  Pending further eval of residual LICA stenosis.  5. CKD III:  Creat stable @ 1.4.  F/u post cath.  Hold ARB and home dose of lasix.  Signed, Nicolasa Duckinghristopher Tahari Clabaugh, NP 09/15/2015, 5:05 PM

## 2015-09-15 NOTE — ED Triage Notes (Signed)
CP starting an hour ago. Center of chest. A week of having no energy. Has [employer]tr[employer] pacer; office confirmed ok as of yesterday. Diaphoretic on arrival. Given  asa and 1 nitro.

## 2015-09-15 NOTE — ED Notes (Signed)
Cardiology at bedside.

## 2015-09-16 ENCOUNTER — Encounter (HOSPITAL_COMMUNITY): Payer: Self-pay | Admitting: Physician Assistant

## 2015-09-16 DIAGNOSIS — D638 Anemia in other chronic diseases classified elsewhere: Secondary | ICD-10-CM

## 2015-09-16 DIAGNOSIS — R6881 Early satiety: Secondary | ICD-10-CM

## 2015-09-16 DIAGNOSIS — D649 Anemia, unspecified: Secondary | ICD-10-CM

## 2015-09-16 DIAGNOSIS — K920 Hematemesis: Secondary | ICD-10-CM

## 2015-09-16 LAB — CBC
HEMATOCRIT: 34 % — AB (ref 39.0–52.0)
HEMATOCRIT: 33.3 % — AB (ref 39.0–52.0)
HEMOGLOBIN: 10.6 g/dL — AB (ref 13.0–17.0)
Hemoglobin: 11.1 g/dL — ABNORMAL LOW (ref 13.0–17.0)
MCH: 25 pg — AB (ref 26.0–34.0)
MCH: 24.4 pg — ABNORMAL LOW (ref 26.0–34.0)
MCHC: 32.6 g/dL (ref 30.0–36.0)
MCHC: 31.8 g/dL (ref 30.0–36.0)
MCV: 76.6 fL — AB (ref 78.0–100.0)
MCV: 76.6 fL — ABNORMAL LOW (ref 78.0–100.0)
PLATELETS: 137 10*3/uL — AB (ref 150–400)
Platelets: 137 10*3/uL — ABNORMAL LOW (ref 150–400)
RBC: 4.44 MIL/uL (ref 4.22–5.81)
RBC: 4.35 MIL/uL (ref 4.22–5.81)
RDW: 19 % — AB (ref 11.5–15.5)
RDW: 18.9 % — ABNORMAL HIGH (ref 11.5–15.5)
WBC: 4.8 10*3/uL (ref 4.0–10.5)
WBC: 4.7 10*3/uL (ref 4.0–10.5)

## 2015-09-16 LAB — URINALYSIS, ROUTINE W REFLEX MICROSCOPIC
GLUCOSE, UA: NEGATIVE mg/dL
KETONES UR: 15 mg/dL — AB
Nitrite: NEGATIVE
PH: 5.5 (ref 5.0–8.0)
PROTEIN: 100 mg/dL — AB
Specific Gravity, Urine: 1.046 — ABNORMAL HIGH (ref 1.005–1.030)

## 2015-09-16 LAB — URINE MICROSCOPIC-ADD ON

## 2015-09-16 LAB — BASIC METABOLIC PANEL
Anion gap: 13 (ref 5–15)
BUN: 20 mg/dL (ref 6–20)
CHLORIDE: 102 mmol/L (ref 101–111)
CO2: 20 mmol/L — AB (ref 22–32)
CREATININE: 1.41 mg/dL — AB (ref 0.61–1.24)
Calcium: 9 mg/dL (ref 8.9–10.3)
GFR calc Af Amer: 50 mL/min — ABNORMAL LOW (ref >60)
GFR calc non Af Amer: 43 mL/min — ABNORMAL LOW (ref >60)
GLUCOSE: 131 mg/dL — AB (ref 65–99)
POTASSIUM: 4.6 mmol/L (ref 3.5–5.1)
SODIUM: 135 mmol/L (ref 135–145)

## 2015-09-16 LAB — IRON AND TIBC
IRON: 20 ug/dL — AB (ref 45–182)
SATURATION RATIOS: 6 % — AB (ref 17.9–39.5)
TIBC: 363 ug/dL (ref 250–450)
UIBC: 343 ug/dL

## 2015-09-16 LAB — FERRITIN: Ferritin: 23 ng/mL — ABNORMAL LOW (ref 24–336)

## 2015-09-16 LAB — LIPID PANEL
Cholesterol: 125 mg/dL (ref 0–200)
HDL: 41 mg/dL (ref >40)
LDL CALC: 77 mg/dL (ref 0–99)
TRIGLYCERIDES: 36 mg/dL (ref <150)
Total CHOL/HDL Ratio: 3 RATIO
VLDL: 7 mg/dL (ref 0–40)

## 2015-09-16 MED ORDER — ATROPINE SULFATE 1 MG/10ML IJ SOSY
PREFILLED_SYRINGE | INTRAMUSCULAR | Status: DC
Start: 2015-09-16 — End: 2015-09-16
  Filled 2015-09-16: qty 10

## 2015-09-16 MED ORDER — PHENOL 1.4 % MT LIQD
1.0000 | OROMUCOSAL | Status: DC | PRN
  Filled 2015-09-16: qty 177

## 2015-09-16 MED ORDER — FAMOTIDINE IN NACL 20-0.9 MG/50ML-% IV SOLN
20.0000 mg | Freq: Four times a day (QID) | INTRAVENOUS | Status: DC
  Administered 2015-09-16 – 2015-09-18 (×7): 20 mg via INTRAVENOUS
  Filled 2015-09-16 (×7): qty 50

## 2015-09-16 NOTE — Plan of Care (Signed)
Problem: Pain Managment: Goal: General experience of comfort will improve Pt is restless and generally uncomfortable  Problem: Physical Regulation: Goal: Ability to maintain clinical measurements within normal limits will improve Outcome: Progressing Hemodynamically stable

## 2015-09-16 NOTE — Plan of Care (Signed)
Problem: Nutrition: Goal: Adequate nutrition will be maintained Outcome: Not Progressing Pt having nausea and no appetite at this time

## 2015-09-16 NOTE — Consult Note (Signed)
Consultation  Referring Provider:     Dr. Armanda Magic Primary Care Physician:  Kristian Covey, MD Primary Gastroenterologist:   Leone Payor      Reason for Consultation:     hematemesis     Impression / Plan:   Coffee Ground Emesis Acute MI - on clopidogrel and ASA now, s/p incomplete PCI Microcytic anemia - chronic w/ Hgb 9 4 months ago  Chronic early satiety, anorexia with chronic diarrhea sxs - negative colonoscopy 2016 Afib  Do not think he is having a major hemorrhage but think avoiding heparin reasonable at this time Has some hx warfarin problems in 1991  Needs an EGD at some point - timing based upon clinical course - we will reassess him tomorrow Would use a PPI if you think ok if not change Pepcid to 20 mg IV q6  I am clamping the NGtube and trying clears if tolerated  Check iron studies ([id number] in 2016)           HPI:   Oscar Santiago is a 82 y.o. male admitted yesterday with chest pain and MI - attempted PCI but unable to successfully clear clot in 100% distal LAD lesion - but chest pain resolved. After the cath he vomited coffee grounds x 2 - NG tube inserted and small volume out since and no vomiting. Has had chronic diarrhea issues with negative colonoscopy by me 04/2014. Lots of prostate and bladder issues and CHF - also with early satiety, anorexia and fluctuating weight - chronic. No recent EGD if ever.  CBC Latest Ref Rng & Units 09/16/2015 09/15/2015 09/15/2015  WBC 4.0 - 10.5 K/uL 4.8 4.6 -  Hemoglobin 13.0 - 17.0 g/dL 11.1(L) 10.7(L) 13.3  Hematocrit 39.0 - 52.0 % 34.0(L) 32.5(L) 39.0  Platelets 150 - 400 K/uL 137(L) 136(L) -     Past Medical History:  Diagnosis Date  . Carotid arterial disease (HCC)    a. s/p R CEA with residual LICA 80% stenosis pending further eval.  . Chronic systolic dysfunction of left ventricle    a. EF 30-35% by echo 10/10/2009;  b. 10/2014 Echo: EF 40%, m ild LVH, mod MR, sev dil LA, mildly reduced RV fxn, mod TR, PASP  .  . CKD (chronic kidney disease), stage III   . Complete heart block (HCC)    a. s/p MDT single chamber PPM - initially placed in 2008 w/ gen change in 2016.  Marland Kitchen Compression fracture of spine (HCC)    T 10 and T11  . Glaucoma   . Gout   . Hearing loss    bilateral -hearing aids  . Hemorrhoid   . History of GI bleed    from coumadin  . Hyperlipidemia   . Hypertension   . Hypothyroid   . Inguinal hernia   . Kidney stone   . Major depression (HCC)   . MR (mitral regurgitation)   . OSA (obstructive sleep apnea)    on CPAP therapy  . Osteopenia   . Permanent atrial fibrillation (HCC)    a. refuses coumadin due to history of GI bleed - was prev Rx eliquis->never took.  . Seizure disorder (HCC)   . Stroke [facility])     Past Surgical History:  Procedure Laterality Date  . CARDIAC CATHETERIZATION  2002  . COLONOSCOPY WITH PROPOFOL N/A 05/01/2014   Procedure: COLONOSCOPY WITH PROPOFOL;  Surgeon: [name], MD;  Location: [facility] ENDOSCOPY;  Service: Endoscopy;  Laterality: N/A;  . ENDARTERECTOMY  Right 10/23/2014   Procedure: ENDARTERECTOMY CAROTID;  Surgeon: Larina Earthly, MD;  Location: [facility] OR;  Service: Vascular;  Laterality: Right;  . EP IMPLANTABLE DEVICE N/A 12/26/2014   Procedure: PPM Generator Changeout;  Surgeon: Marinus Maw, MD;  Location: [facility] INVASIVE CV LAB;  Service: Cardiovascular;  Laterality: N/A;  . HEMORRHOID SURGERY    . INGUINAL HERNIA REPAIR    . INSERTION OF SUPRAPUBIC CATHETER N/A 04/05/2015   Procedure: INSERTION OF SUPRAPUBIC CATHETER;  Surgeon: Jethro Bolus, MD;  Location: [facility] ORS;  Service: Urology;  Laterality: N/A;  . PACEMAKER INSERTION  07/28/06   VVI pacemaker for complete heart block by Dr Amil Amen  . TRANSURETHRAL RESECTION OF PROSTATE N/A 04/05/2015   Procedure: TRANSURETHRAL RESECTION OF THE PROSTATE (TURP);  Surgeon: Jethro Bolus, MD;  Location: [facility] ORS;  Service: Urology;  Laterality: N/A;    Family History  Problem Relation Age of Onset   . Heart failure Mother   . CVA Sister   . Aneurysm Brother     brain  . CVA Brother   . Transient ischemic attack Daughter   . Colon cancer Neg Hx   . Colon polyps Neg Hx     Social History  Substance Use Topics  . Smoking status: Never Smoker  . Smokeless tobacco: Never Used  . Alcohol use No    Prior to Admission medications   Medication Sig Start Date End Date Taking? Authorizing Provider  acetaminophen (TYLENOL) 500 MG tablet Take 1 tablet (500 mg total) by mouth every 8 (eight) hours as needed (pain). 11/09/14  Yes Albertine Grates, MD  amLODipine (NORVASC) 10 MG tablet Take 5 mg by mouth at bedtime.    Yes Historical Provider, MD  aspirin 81 MG tablet Take 81 mg by mouth daily.   Yes Historical Provider, MD  cetirizine (ZYRTEC) 10 MG tablet Take 10 mg by mouth daily.   Yes Historical Provider, MD  colchicine 0.6 MG tablet Take 0.6 mg by mouth 2 (two) times daily as needed (gout).   Yes Historical Provider, MD  dorzolamide-timolol (COSOPT) 22.3-6.8 MG/ML ophthalmic solution Place 1 drop into both eyes 2 (two) times daily. 09/09/14  Yes Historical Provider, MD  dorzolamide-timolol (COSOPT) 22.3-6.8 MG/ML ophthalmic solution Place 1 drop into both eyes 2 (two) times daily. 05/10/15  Yes Shanker Levora Dredge, MD  famotidine (PEPCID AC) 10 MG chewable tablet Chew 10 mg by mouth daily as needed for heartburn.  07/18/04  Yes Historical Provider, MD  finasteride (PROSCAR) 5 MG tablet TAKE 1 TABLET (5 MG TOTAL) BY MOUTH DAILY. 03/29/15  Yes Historical Provider, MD  fluticasone (FLONASE) 50 MCG/ACT nasal spray Place 2 sprays into both nostrils daily. 09/10/15  Yes Terressa Koyanagi, DO  furosemide (LASIX) 20 MG tablet Take 1 tablet (20 mg total) by mouth daily. 03/21/15  Yes Lyn Records, MD  latanoprost (XALATAN) 0.005 % ophthalmic solution Place 1 drop into both eyes at bedtime.  02/05/14  Yes Historical Provider, MD  levETIRAcetam (KEPPRA) 500 MG tablet Take 1 tablet (500 mg total) by mouth 2 (two) times daily.  Take 1/2 tablet (250 mg) by mouth every morning and 1 tablet (500 mg) every night 09/12/15  Yes Marvel Plan, MD  levothyroxine (SYNTHROID, LEVOTHROID) 25 MCG tablet TAKE ONE TABLET BY MOUTH ONCE DAILY BEFORE BREAKFAST 08/23/15  Yes Kristian Covey, MD  Multiple Vitamin (MULTIVITAMIN WITH MINERALS) TABS tablet Take 1 tablet by mouth daily.   Yes Historical Provider, MD  polyethylene glycol (MIRALAX /  GLYCOLAX) packet Take 17 g by mouth at bedtime. 05/10/15  Yes Shanker Levora DredgeM Ghimire, MD  senna-docusate (SENOKOT-S) 8.6-50 MG tablet Take 2 tablets by mouth at bedtime. 05/10/15  Yes Shanker Levora DredgeM Ghimire, MD  traMADol-acetaminophen (ULTRACET) 37.5-325 MG tablet Take 1 tablet by mouth every 6 (six) hours as needed for severe pain. 04/16/15  Yes Jethro BolusSigmund Tannenbaum, MD  valsartan (DIOVAN) 320 MG tablet Take 1 tablet (320 mg total) by mouth daily with lunch. 03/23/15  Yes Lyn RecordsHenry W Smith, MD  vitamin B-12 (CYANOCOBALAMIN) 1000 MCG tablet Take 1,000 mcg by mouth daily.   Yes Historical Provider, MD  zolpidem (AMBIEN) 5 MG tablet Take 5 mg by mouth at bedtime.   Yes Historical Provider, MD  hyoscyamine (LEVSIN/SL) 0.125 MG SL tablet Place 1 tablet (0.125 mg total) under the tongue every 4 (four) hours as needed for cramping. 04/06/15   Jethro BolusSigmund Tannenbaum, MD  mebendazole (VERMOX) 100 MG chewable tablet Chew 1 tablet (100 mg total) by mouth daily. 08/24/15   Kristian CoveyBruce W Burchette, MD  tamsulosin (FLOMAX) 0.4 MG CAPS capsule Take 1 capsule by mouth once daily 03/20/15   Historical Provider, MD    Current Facility-Administered Medications  Medication Dose Route Frequency Provider Last Rate Last Dose  . 0.9 %  sodium chloride infusion  10-20 mL/hr Intravenous Continuous Tilden FossaElizabeth Rees, MD 10 mL/hr at 09/16/15 0800 10 mL/hr at 09/16/15 0800  . 0.9 %  sodium chloride infusion  250 mL Intravenous PRN Runell GessJonathan J Berry, MD      . acetaminophen (TYLENOL) tablet 650 mg  650 mg Oral Q4H PRN Runell GessJonathan J Berry, MD      . amLODipine (NORVASC)  tablet 5 mg  5 mg Oral QHS Ok Anishristopher R Berge, NP      . aspirin chewable tablet 81 mg  81 mg Oral Daily Runell GessJonathan J Berry, MD   81 mg at 09/16/15 0931  . atorvastatin (LIPITOR) tablet 40 mg  40 mg Oral q1800 Ok Anishristopher R Berge, NP      . clopidogrel (PLAVIX) tablet 75 mg  75 mg Oral Q breakfast Runell GessJonathan J Berry, MD   75 mg at 09/16/15 1003  . dorzolamide-timolol (COSOPT) 22.3-6.8 MG/ML ophthalmic solution 1 drop  1 drop Both Eyes BID Ok Anishristopher R Berge, NP   1 drop at 09/16/15 0930  . famotidine (PEPCID) IVPB 20 mg premix  20 mg Intravenous Q24H Quintella Reichertraci R Turner, MD   20 mg at 09/15/15 2215  . famotidine (PEPCID) tablet 10 mg  10 mg Oral Daily PRN Ok Anishristopher R Berge, NP      . finasteride (PROSCAR) tablet 5 mg  5 mg Oral Daily Ok Anishristopher R Berge, NP   5 mg at 09/16/15 0931  . fluticasone (FLONASE) 50 MCG/ACT nasal spray 2 spray  2 spray Each Nare Daily Ok Anishristopher R Berge, NP   2 spray at 09/16/15 0933  . hydrALAZINE (APRESOLINE) injection 10 mg  10 mg Intravenous Q4H PRN Runell GessJonathan J Berry, MD   10 mg at 09/16/15 0140  . latanoprost (XALATAN) 0.005 % ophthalmic solution 1 drop  1 drop Both Eyes QHS Ok Anishristopher R Berge, NP   1 drop at 09/15/15 2312  . levETIRAcetam (KEPPRA) tablet 250 mg  250 mg Oral Daily Runell GessJonathan J Berry, MD   250 mg at 09/16/15 0931   And  . levETIRAcetam (KEPPRA) tablet 500 mg  500 mg Oral QHS Runell GessJonathan J Berry, MD      . levothyroxine (SYNTHROID, LEVOTHROID) tablet 25 mcg  25 mcg Oral  QAC breakfast Ok Anis, NP   25 mcg at 09/16/15 1003  . loratadine (CLARITIN) tablet 10 mg  10 mg Oral Daily Ok Anis, NP   10 mg at 09/16/15 0931  . metoprolol tartrate (LOPRESSOR) tablet 12.5 mg  12.5 mg Oral BID Ok Anis, NP   12.5 mg at 09/16/15 0930  . morphine 2 MG/ML injection 2 mg  2 mg Intravenous Q1H PRN Runell Gess, MD   2 mg at 09/15/15 2358  . multivitamin with minerals tablet 1 tablet  1 tablet Oral Daily Ok Anis, NP   1 tablet at  09/16/15 0930  . nitroGLYCERIN (NITROSTAT) SL tablet 0.4 mg  0.4 mg Sublingual Q5 Min x 3 PRN Ok Anis, NP      . ondansetron [facility]) injection 4 mg  4 mg Intravenous Q4H PRN Quintella Reichert, MD   4 mg at 09/16/15 1011  . polyethylene glycol (MIRALAX / GLYCOLAX) packet 17 g  17 g Oral QHS Ok Anis, NP      . senna-docusate (Senokot-S) tablet 2 tablet  2 tablet Oral QHS Ok Anis, NP      . sodium chloride flush (NS) 0.9 % injection 3 mL  3 mL Intravenous Q12H Runell Gess, MD   3 mL at 09/16/15 0932  . sodium chloride flush (NS) 0.9 % injection 3 mL  3 mL Intravenous PRN Runell Gess, MD      . traMADol-acetaminophen (ULTRACET) 37.5-325 MG per tablet 1 tablet  1 tablet Oral Q6H PRN Ok Anis, NP      . zolpidem (AMBIEN) tablet 5 mg  5 mg Oral QHS Ok Anis, NP        Allergies as of 09/15/2015 - Review Complete 09/15/2015  Allergen Reaction Noted  . Coumadin [warfarin sodium] Other (See Comments) 12/24/2012  . Warfarin Other (See Comments) 09/20/2014  . Lisinopril Cough 12/24/2012     Review of Systems:    This is positive for those things mentioned in the HPI, also positive for chest pain and Mi last night - none today Chronic weight fluctuation/fluid retention All other review of systems are negative.       Physical Exam:  Vital signs in last 24 hours: Temp:  [96.2 F (35.7 C)-97.6 F (36.4 C)] 97.4 F (36.3 C) (08/13 0853) Pulse Rate:  [0-98] 59 (08/13 1000) Resp:  [0-33] 26 (08/13 1000) BP: (130-178)/(61-112) 161/80 (08/13 1000) SpO2:  [0 %-100 %] 100 % (08/13 1000) Weight:  [185 lb (83.9 kg)] 185 lb (83.9 kg) (08/12 1604) Last BM Date: 09/14/15  General:  Well-developed, well-nourished and in no acute distress Eyes:  anicteric. ENT:   Mouth and posterior pharynx free of lesions. NG tube draining coffee ground material Neck:   supple w/o thyromegaly or mass.  Lungs: Clear to auscultation bilaterally. Heart:    S1S2, no rubs, murmurs, gallops. Abdomen:  soft, non-tender, no hepatosplenomegaly, hernia, or mass and BS+.  Lymph:  no cervical or supraclavicular adenopathy. Extremities:   no edema Skin   no rash. Neuro:  A&O x 3.  Psych:  appropriate mood and  Affect.   Data Reviewed:   LAB RESULTS:  Recent Labs  09/15/15 1604 09/15/15 1613 09/15/15 2222 09/16/15 0418  WBC 3.7*  --  4.6 4.8  HGB 11.4* 13.3 10.7* 11.1*  HCT 34.6* 39.0 32.5* 34.0*  PLT 156  --  136* 137*   BMET  Recent Labs  09/15/15 1604  09/15/15 1613 09/16/15 0418  NA 132* 137 135  K 4.2 4.4 4.6  CL 102 105 102  CO2 18*  --  20*  GLUCOSE 126* 123* 131*  BUN 18 21* 20  CREATININE 1.46* 1.40* 1.41*  CALCIUM 9.2  --  9.0   LFT  Recent Labs  09/15/15 1604  PROT 8.0  ALBUMIN 3.9  AST 87*  ALT 45  ALKPHOS 82  BILITOT 1.0   PT/INR  Recent Labs  09/15/15 1604  LABPROT 17.5*  INR 1.42    STUDIES: Dg Chest Port 1 View  Result Date: 09/15/2015 CLINICAL DATA:  Chest pain EXAM: PORTABLE CHEST 1 VIEW COMPARISON:  05/07/2015 chest radiograph. FINDINGS: Stable configuration of single lead left subclavian pacemaker with lead tip overlying the coronary sinus. Stable cardiomediastinal silhouette with mild cardiomegaly. No pneumothorax. No pleural effusion. Mild pulmonary edema. IMPRESSION: Mild congestive heart failure. Electronically Signed   By: Delbert Phenix M.D.   On: 09/15/2015 16:13     Thanks   LOS: 1 day    Sena Slate, MD, [facility] @  09/16/2015, 10:21 AM

## 2015-09-16 NOTE — Progress Notes (Signed)
Call from 2S for a sheath pull for [name]. Femoral sheath sutured into right groin post cath from earlier today. Patient with NG tube to suction, but resting comfortably from N/V earlier this evening. Patient awake and alert, instructions given to patient at start of sheath pull to explain a lot of pressure will be held on groin for about 20 minutes. Patient stated he understood and was comfortable to begin. VSS with a slight elevation of B/P 163/81. B/P monitoring set for every 3 minutes cycle. Suture removed, blood draw back done with sheath in place prior to removal, and sheath removed at 01:25. Pressure held for 20 minutes with pulse check done every 3 minutes with doppler on dorsalis pedal pulse. Slight hematoma oozing around pressure site so [place], RN held pressure over that site while I held arterial pressure. After 20 minute hold, pressure released and all areas around sheath site soft to touch. Pressure dressing applied and leg was sheeted for reminder to hold leg straight. Hydralazine 10mg  given IV during sheath pull to lower pressure. See RN Leary RocaAngels note. Patient was occassionly V paced during pulling of sheath. Lawanna KobusAngel, RN stated she would watch patient closely and remind family member to apply pressure to site if sneezing or coughing occurred.  At 01:50 patient remained in supine position and comfortable post sheath removal.

## 2015-09-17 ENCOUNTER — Encounter (HOSPITAL_COMMUNITY): Payer: Self-pay | Admitting: Cardiovascular Disease

## 2015-09-17 DIAGNOSIS — R0982 Postnasal drip: Secondary | ICD-10-CM

## 2015-09-17 DIAGNOSIS — R112 Nausea with vomiting, unspecified: Secondary | ICD-10-CM

## 2015-09-17 DIAGNOSIS — K921 Melena: Secondary | ICD-10-CM

## 2015-09-17 DIAGNOSIS — R05 Cough: Secondary | ICD-10-CM

## 2015-09-17 DIAGNOSIS — J301 Allergic rhinitis due to pollen: Secondary | ICD-10-CM

## 2015-09-17 DIAGNOSIS — I213 ST elevation (STEMI) myocardial infarction of unspecified site: Secondary | ICD-10-CM

## 2015-09-17 LAB — CBC
HEMATOCRIT: 34.8 % — AB (ref 39.0–52.0)
Hemoglobin: 11.1 g/dL — ABNORMAL LOW (ref 13.0–17.0)
MCH: 25 pg — ABNORMAL LOW (ref 26.0–34.0)
MCHC: 31.9 g/dL (ref 30.0–36.0)
MCV: 78.4 fL (ref 78.0–100.0)
Platelets: 135 10*3/uL — ABNORMAL LOW (ref 150–400)
RBC: 4.44 MIL/uL (ref 4.22–5.81)
RDW: 19.4 % — AB (ref 11.5–15.5)
WBC: 9.5 10*3/uL (ref 4.0–10.5)

## 2015-09-17 LAB — TROPONIN I: Troponin I: 5.5 ng/mL (ref <0.03)

## 2015-09-17 LAB — HEPATIC FUNCTION PANEL
ALBUMIN: 3.5 g/dL (ref 3.5–5.0)
ALT: 57 U/L (ref 17–63)
AST: 173 U/L — ABNORMAL HIGH (ref 15–41)
Alkaline Phosphatase: 69 U/L (ref 38–126)
BILIRUBIN DIRECT: 0.3 mg/dL (ref 0.1–0.5)
BILIRUBIN TOTAL: 0.9 mg/dL (ref 0.3–1.2)
Indirect Bilirubin: 0.6 mg/dL (ref 0.3–0.9)
Total Protein: 7.2 g/dL (ref 6.5–8.1)

## 2015-09-17 LAB — BASIC METABOLIC PANEL
Anion gap: 8 (ref 5–15)
BUN: 23 mg/dL — AB (ref 6–20)
CALCIUM: 9.1 mg/dL (ref 8.9–10.3)
CO2: 23 mmol/L (ref 22–32)
CREATININE: 1.62 mg/dL — AB (ref 0.61–1.24)
Chloride: 106 mmol/L (ref 101–111)
GFR calc Af Amer: 42 mL/min — ABNORMAL LOW (ref >60)
GFR calc non Af Amer: 36 mL/min — ABNORMAL LOW (ref >60)
Glucose, Bld: 139 mg/dL — ABNORMAL HIGH (ref 65–99)
Potassium: 4.5 mmol/L (ref 3.5–5.1)
Sodium: 137 mmol/L (ref 135–145)

## 2015-09-17 LAB — POCT ACTIVATED CLOTTING TIME: ACTIVATED CLOTTING TIME: 560 s

## 2015-09-17 LAB — MAGNESIUM: MAGNESIUM: 2.2 mg/dL (ref 1.7–2.4)

## 2015-09-17 MED ORDER — AMIODARONE HCL IN DEXTROSE 360-4.14 MG/200ML-% IV SOLN
60.0000 mg/h | INTRAVENOUS | Status: AC
  Administered 2015-09-17: 60 mg/h via INTRAVENOUS
  Filled 2015-09-17 (×2): qty 200

## 2015-09-17 MED ORDER — AMIODARONE LOAD VIA INFUSION
150.0000 mg | Freq: Once | INTRAVENOUS | Status: AC
  Administered 2015-09-17: 150 mg via INTRAVENOUS
  Filled 2015-09-17: qty 83.34

## 2015-09-17 MED ORDER — METOPROLOL TARTRATE 25 MG PO TABS
25.0000 mg | ORAL_TABLET | Freq: Two times a day (BID) | ORAL | Status: DC
  Administered 2015-09-17 – 2015-09-19 (×5): 25 mg via ORAL
  Filled 2015-09-17 (×5): qty 1

## 2015-09-17 MED ORDER — AMIODARONE HCL IN DEXTROSE 360-4.14 MG/200ML-% IV SOLN
30.0000 mg/h | INTRAVENOUS | Status: DC
  Administered 2015-09-17: 30 mg/h via INTRAVENOUS
  Filled 2015-09-17: qty 200

## 2015-09-17 NOTE — Progress Notes (Signed)
CARDIAC REHAB PHASE I   PRE:  Rate/Rhythm: paced 81 trig PVCs  BP:  Supine:   Sitting: 130/60  Standing:    SaO2: 100%RA  MODE:  Ambulation: walked around bed to chair ft   POST:  Rate/Rhythm: 84 paced with about 6 beats VT upon standing  BP:  Supine:   Sitting: 154/56  Standing:    SaO2: 94%RA [id number] Talked with RN re activity. Pt stated he would like to get OOB. Was going to walk until pt had 6 beats VT with standing. Decided to get to chair. Pt's RN in room. No CP. Pt walked around bed to chair with asst x 2 and rolling walker. Pt needed cues. Would recommend PT to help with discharge planning and to help with deconditioning. Some trigeminy when up and less with sitting.   Luetta Nuttingharlene Areen Trautner, RN BSN  09/17/2015 2:55 PM

## 2015-09-17 NOTE — Progress Notes (Signed)
09/17/2015 0810 Rn noted on monitor 9 second run of V Tach. Returning to V paced underlying Afib rhythm.  Pt. Resting in bed, asymptomatic. VSS. Dr. Mayford Knifeurner and Ronie Spiesayna Dunn [facility] and made aware. [facility], STAT lab orders placed. MD to see pt. Will continue to closely monitor patient. Ashaunte Standley, Blanchard KelchStephanie Ingold

## 2015-09-17 NOTE — Consult Note (Signed)
Consultation Note Date: 09/17/2015   Patient Name: Oscar Santiago  DOB: Jan 26, 1926  MRN: [id number]  Age / Sex: 82 y.o., male  PCP: Kristian Covey, MD Referring Physician: Runell Gess, MD  Reason for Consultation: Establishing goals of care  HPI/Patient Profile: 82 y.o. male  admitted on 09/15/2015   Clinical Assessment and Goals of Care:  82 year old woman with a past medical history significant for chronic systolic congestive heart failure ejection fraction 40% September 2016, history of complete heart block status post permanent pacemaker placement, history of carotid artery disease status post right carotid endarterectomy, hypertension dyslipidemia previous stroke and history of atrial fibrillation who was not on oral anticoagulation secondary to history of GI bleed presented with acute ST segment elevation MI. He underwent cardiac catheterization and was found to have distal LAD 100% lesion. After cardiac catheterization he had coffee-ground emesis and was seen by gastroenterology. He had brief run of V. tach and was placed on amiodarone. He is being cautiously monitored and cardiology and gastroenterology specialists are following him. He has underlying history of benign prostatic hypertrophy and apparently had some hematuria earlier in this hospitalization. At times he was also reported to be confused earlier. Palliative care consultation has been requested for goals of care and further discussions.  Patient is resting in his chair. I introduced myself and palliative care as follows: Palliative medicine is specialized medical care for people living with serious illness. It focuses on providing relief from the symptoms and stress of a serious illness. The goal is to improve quality of life for both the patient and the family.  Patient remains on amiodarone. He is also being given aspirin and Plavix. He  is also on Pepcid. His brother is present at the bedside. Reportedly the patient only had ice cream for breakfast but did have a good lunch. However he was noted to have some coughing and choking spells and hence a speech therapy evaluation is pending. See recommendations below. Thank you for the consult.  NEXT OF KIN  Granddaughter Valora Piccolo  at [phone]  SUMMARY OF RECOMMENDATIONS    Agree with DO NOT RESUSCITATE Monitor disease trajectory and this hospitalization Possibly home with home health care versus skilled nursing facility with palliative follow-up after discharge Recommend repeat urine analysis if the patient has ongoing confusion Code Status/Advance Care Planning:  DNR    Symptom Management:    Continue current treatment  Palliative Prophylaxis:   Delirium Protocol     Psycho-social/Spiritual:   Desire for further Chaplaincy support:no  Additional Recommendations: Caregiving  Support/Resources  Prognosis:   Unable to determine  Discharge Planning: Home with Home Health likely for now, could consider SNF rehab with palliative follow up if family prefers.       Primary Diagnoses: Present on Admission: . Acute inferolateral myocardial infarction (HCC) . Acute ST elevation myocardial infarction (STEMI) of lateral wall (HCC)   I have reviewed the medical record, interviewed the patient and family, and examined the patient. The following aspects are pertinent.  Past Medical History:  Diagnosis Date  . Carotid arterial disease (HCC)    a. s/p R CEA with residual LICA 80% stenosis pending further eval.  . Chronic systolic dysfunction of left ventricle    a. EF 30-35% by echo 10/10/2009;  b. 10/2014 Echo: EF 40%, m ild LVH, mod MR, sev dil LA, mildly reduced RV fxn, mod TR, PASP 35mmHg.  . CKD (chronic kidney disease), stage III   . Complete heart block (HCC)    a. s/p MDT single chamber PPM - initially placed in 2008 w/ gen change in 2016.  Marland Kitchen.  Compression fracture of spine (HCC)    T 10 and T11  . Glaucoma   . Gout   . Hearing loss    bilateral -hearing aids  . Hemorrhoid   . History of GI bleed    from coumadin  . Hyperlipidemia   . Hypertension   . Hypothyroid   . Inguinal hernia   . Kidney stone   . Major depression (HCC)   . MR (mitral regurgitation)   . OSA (obstructive sleep apnea)    on CPAP therapy  . Osteopenia   . Permanent atrial fibrillation (HCC)    a. refuses coumadin due to history of GI bleed - was prev Rx eliquis->never took.  . Seizure disorder (HCC)   . Stroke [facility](HCC)    Social History   Social History  . Marital status: Widowed    Spouse name: N/A  . Number of children: 6  . Years of education: N/A   Occupational History  . Retired     Education officer, [employer]   Social History Main Topics  . Smoking status: Never Smoker  . Smokeless tobacco: Never Used  . Alcohol use No  . Drug use: No  . Sexual activity: Not Asked   Other Topics Concern  . 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 Katrinka BlazingSmith - Cardiology   Dr. Leone PayorGessner - Gastroenterology   Dr. Brunilda PayorNesi - Urology   [facility]   Family History  Problem Relation Age of Onset  . Heart failure Mother   . CVA Sister   . Aneurysm Brother     brain  . CVA Brother   . Transient ischemic attack Daughter   . Colon cancer Neg Hx   . Colon polyps Neg Hx    Scheduled Meds: . amLODipine  5 mg Oral QHS  . aspirin  81 mg Oral Daily  . atorvastatin  40 mg Oral q1800  . clopidogrel  75 mg Oral Q breakfast  . dorzolamide-timolol  1 drop Both Eyes BID  . famotidine (PEPCID) IV  20 mg Intravenous Q6H  . finasteride  5 mg Oral Daily  . fluticasone  2 spray Each Nare Daily  . latanoprost  1 drop Both Eyes QHS  . levETIRAcetam  250 mg Oral Daily   And  . levETIRAcetam  500 mg Oral QHS  . levothyroxine  25 mcg Oral  QAC breakfast  . loratadine  10 mg Oral Daily  . metoprolol tartrate  25 mg Oral BID  . multivitamin with minerals  1 tablet Oral Daily  . polyethylene glycol  17 g Oral QHS  . senna-docusate  2 tablet Oral QHS  . sodium chloride flush  3 mL Intravenous Q12H  .  zolpidem  5 mg Oral QHS   Continuous Infusions: . sodium chloride 10 mL/hr (09/17/15 0800)  . amiodarone 30 mg/hr (09/17/15 1630)   PRN Meds:.sodium chloride, acetaminophen, hydrALAZINE, morphine injection, nitroGLYCERIN, ondansetron (ZOFRAN) IV, phenol, sodium chloride flush, traMADol-acetaminophen Medications Prior to Admission:  Prior to Admission medications   Medication Sig Start Date End Date Taking? Authorizing Provider  acetaminophen (TYLENOL) 500 MG tablet Take 1 tablet (500 mg total) by mouth every 8 (eight) hours as needed (pain). 11/09/14  Yes Albertine Grates, MD  amLODipine (NORVASC) 10 MG tablet Take 5 mg by mouth at bedtime.    Yes Historical Provider, MD  aspirin 81 MG tablet Take 81 mg by mouth daily.   Yes Historical Provider, MD  cetirizine (ZYRTEC) 10 MG tablet Take 10 mg by mouth daily.   Yes Historical Provider, MD  colchicine 0.6 MG tablet Take 0.6 mg by mouth 2 (two) times daily as needed (gout).   Yes Historical Provider, MD  dorzolamide-timolol (COSOPT) 22.3-6.8 MG/ML ophthalmic solution Place 1 drop into both eyes 2 (two) times daily. 09/09/14  Yes Historical Provider, MD  dorzolamide-timolol (COSOPT) 22.3-6.8 MG/ML ophthalmic solution Place 1 drop into both eyes 2 (two) times daily. 05/10/15  Yes Shanker Levora Dredge, MD  famotidine (PEPCID AC) 10 MG chewable tablet Chew 10 mg by mouth daily as needed for heartburn.  07/18/04  Yes Historical Provider, MD  finasteride (PROSCAR) 5 MG tablet TAKE 1 TABLET (5 MG TOTAL) BY MOUTH DAILY. 03/29/15  Yes Historical Provider, MD  fluticasone (FLONASE) 50 MCG/ACT nasal spray Place 2 sprays into both nostrils daily. 09/10/15  Yes Terressa Koyanagi, DO  furosemide (LASIX) 20 MG tablet Take 1  tablet (20 mg total) by mouth daily. 03/21/15  Yes Lyn Records, MD  latanoprost (XALATAN) 0.005 % ophthalmic solution Place 1 drop into both eyes at bedtime.  02/05/14  Yes Historical Provider, MD  levETIRAcetam (KEPPRA) 500 MG tablet Take 1 tablet (500 mg total) by mouth 2 (two) times daily. Take 1/2 tablet (250 mg) by mouth every morning and 1 tablet (500 mg) every night 09/12/15  Yes Marvel Plan, MD  levothyroxine (SYNTHROID, LEVOTHROID) 25 MCG tablet TAKE ONE TABLET BY MOUTH ONCE DAILY BEFORE BREAKFAST 08/23/15  Yes Kristian Covey, MD  Multiple Vitamin (MULTIVITAMIN WITH MINERALS) TABS tablet Take 1 tablet by mouth daily.   Yes Historical Provider, MD  polyethylene glycol (MIRALAX / GLYCOLAX) packet Take 17 g by mouth at bedtime. 05/10/15  Yes Shanker Levora Dredge, MD  senna-docusate (SENOKOT-S) 8.6-50 MG tablet Take 2 tablets by mouth at bedtime. 05/10/15  Yes Shanker Levora Dredge, MD  traMADol-acetaminophen (ULTRACET) 37.5-325 MG tablet Take 1 tablet by mouth every 6 (six) hours as needed for severe pain. 04/16/15  Yes Jethro Bolus, MD  valsartan (DIOVAN) 320 MG tablet Take 1 tablet (320 mg total) by mouth daily with lunch. 03/23/15  Yes Lyn Records, MD  vitamin B-12 (CYANOCOBALAMIN) 1000 MCG tablet Take 1,000 mcg by mouth daily.   Yes Historical Provider, MD  zolpidem (AMBIEN) 5 MG tablet Take 5 mg by mouth at bedtime.   Yes Historical Provider, MD  hyoscyamine (LEVSIN/SL) 0.125 MG SL tablet Place 1 tablet (0.125 mg total) under the tongue every 4 (four) hours as needed for cramping. 04/06/15   Jethro Bolus, MD  mebendazole (VERMOX) 100 MG chewable tablet Chew 1 tablet (100 mg total) by mouth daily. 08/24/15   Kristian Covey, MD  tamsulosin (FLOMAX) 0.4 MG CAPS capsule Take 1  capsule by mouth once daily 03/20/15   Historical Provider, MD   Allergies  Allergen Reactions  . Coumadin [Warfarin Sodium] Other (See Comments)    GI Bleed   . Warfarin Other (See Comments)    EXCESS BLDG  W/COUMADIN EXCESS BLDG W/COUMADIN  . Lisinopril Cough   Review of Systems Denies pain No vomiting today  Physical Exam Thin elderly African-American gentleman sitting up in chair S1-S2 Abdomen soft Lungs clear anteriorly Extremities does not have any edema Awake alert answers all questions appropriately  Vital Signs: BP 123/62 (BP Location: Left Arm)   Pulse (!) 115   Temp 97.5 F (36.4 C) (Oral)   Resp (!) 21   Ht 5' 9.5" (1.765 m)   Wt 82.9 kg (182 lb 12.2 oz)   SpO2 99%   BMI 26.60 kg/m  Pain Assessment: No/denies pain   Pain Score: 0-No pain   SpO2: SpO2: 99 %  O2 Device:SpO2: 99 % O2 Flow Rate: .O2 Flow Rate (L/min): 2 L/min  IO: Intake/output summary:  Intake/Output Summary (Last 24 hours) at 09/17/15 1703 Last data filed at 09/17/15 1630  Gross per 24 hour  Intake            826.2 ml  Output              195 ml  Net            631.2 ml    LBM: Last BM Date: 09/17/15 Baseline Weight: Weight: 83.9 kg (185 lb) Most recent weight: Weight: 82.9 kg (182 lb 12.2 oz)     Palliative Assessment/Data:   Flowsheet Rows   Flowsheet Row Most Recent Value  Intake Tab  Referral Department  Cardiology  Unit at Time of Referral  Cardiac/Telemetry Unit  Palliative Care Primary Diagnosis  Cardiac  Date Notified  09/17/15  Palliative Care Type  New Palliative care  Reason for referral  Clarify Goals of Care  Date of Admission  09/15/15  # of days IP prior to Palliative referral  2  Clinical Assessment  Palliative Performance Scale Score  40%  Psychosocial & Spiritual Assessment  Palliative Care Outcomes  Patient/Family meeting held?  Yes      Time In: 1600 Time Out: 1700   Time Total: 60 Greater than 50%  of this time was spent counseling and coordinating care related to the above assessment and plan.  Signed by: Rosalin HawkingZeba Amariz Flamenco, MD  [phone] Please contact Palliative Medicine Team phone at [phone] for questions and concerns.  For individual provider: See  Loretha StaplerAmion

## 2015-09-17 NOTE — Plan of Care (Signed)
Problem: Health Behavior/Discharge Planning: Goal: Ability to manage health-related needs will improve Outcome: Not Progressing Pt remains confused, but family very involved with care  Problem: Activity: Goal: Risk for activity intolerance will decrease Outcome: Progressing Seen by Cardiac Rehab today, up to chair  Problem: Nutrition: Goal: Adequate nutrition will be maintained Outcome: Not Progressing Pt with poor appetite

## 2015-09-17 NOTE — Progress Notes (Signed)
CRITICAL VALUE ALERT  Critical value received:  Troponin 5.50 Critical value from 09/16/15 at 0418, called today   Date of notification:  09/17/15   Time of notification:  1400  Critical value read back: yes   Nurse who received alert: Mechel Haggard, Blanchard KelchStephanie Ingold   MD notified (1st page):  Ronie Spiesayna Dunn [facility]   Time of first page:  1400  MD notified (2nd page):  Time of second page:  Responding MD:  Ronie Spiesayna Dunn [facility]  Time MD responded:  [phone]

## 2015-09-17 NOTE — Progress Notes (Signed)
Patient Name: Heloise OchoaHaywood N Branscome Date of Encounter: 09/17/2015  Active Problems:   Acute ST elevation myocardial infarction (STEMI) due to occlusion of left coronary artery (HCC)   Acute inferolateral myocardial infarction (HCC)   Acute ST elevation myocardial infarction (STEMI) of lateral wall [facility](HCC)   Primary Cardiologist: Dr. Katrinka BlazingSmith  Patient Profile: 82 year old male with a past medical history of chronic systolic CHF (EF 16%40% in Sept. [id number]), CHB s/p PPM, carotid disease s/p R CEA, HTN, HLD, CVA, and permanent Afib not on OAC 2/2 h/o GIB who presented with acute inferolateral STEMI.   SUBJECTIVE: Denies chest pain, although does have some acute delirium. Is alert, no acute distress.    OBJECTIVE Vitals:   09/17/15 0600 09/17/15 0700 09/17/15 0800 09/17/15 0830  BP: (!) 165/65  (!) 160/60   Pulse:   89   Resp: (!) 26 (!) 24    Temp:    97.9 F (36.6 C)  TempSrc:    Oral  SpO2: 97% 99%    Weight:      Height:        Intake/Output Summary (Last 24 hours) at 09/17/15 0857 Last data filed at 09/17/15 0800  Gross per 24 hour  Intake              650 ml  Output              596 ml  Net               54 ml   Filed Weights   09/15/15 1604 09/16/15 1500 09/17/15 0500  Weight: 185 lb (83.9 kg) 184 lb 15.5 oz (83.9 kg) 182 lb 12.2 oz (82.9 kg)    PHYSICAL EXAM General: Well developed, well nourished, male in no acute distress. Head: Normocephalic, atraumatic.  Neck: Supple without bruits, jaw JVD. Lungs:  Resp regular and unlabored, CTA. Heart: RRR, S1, S2, no S3, S4, or murmur; no rub. Abdomen: Soft, non-tender, non-distended, BS + x 4.  Extremities: No clubbing, cyanosis, no edema.  Neuro: Alert and oriented X 3. Moves all extremities spontaneously. Psych: Normal affect.  LABS: CBC: Recent Labs  09/15/15 1604  09/16/15 0418 09/16/15 1539  WBC 3.7*  < > 4.8 4.7  NEUTROABS 1.2*  --   --   --   HGB 11.4*  < > 11.1* 10.6*  HCT 34.6*  < > 34.0* 33.3*  MCV 76.2*  < >  76.6* 76.6*  PLT 156  < > 137* 137*  < > = values in this interval not displayed. INR: Recent Labs  09/15/15 1604  INR 1.42   Basic Metabolic Panel: Recent Labs  09/15/15 1604 09/15/15 1613 09/16/15 0418  NA 132* 137 135  K 4.2 4.4 4.6  CL 102 105 102  CO2 18*  --  20*  GLUCOSE 126* 123* 131*  BUN 18 21* 20  CREATININE 1.46* 1.40* 1.41*  CALCIUM 9.2  --  9.0   Liver Function Tests: Recent Labs  09/15/15 1604  AST 87*  ALT 45  ALKPHOS 82  BILITOT 1.0  PROT 8.0  ALBUMIN 3.9   Cardiac Enzymes: Recent Labs  09/15/15 1604 09/15/15 2011 09/16/15 0418  TROPONINI 0.03* 0.29* 5.50*    Recent Labs  09/15/15 1611  TROPIPOC 0.04   Fasting Lipid Panel: Recent Labs  09/16/15 0418  CHOL 125  HDL 41  LDLCALC 77  TRIG 36  CHOLHDL 3.0    Anemia Panel: Recent Labs  09/16/15  1539  FERRITIN 23*  TIBC 363  IRON 20*     Current Facility-Administered Medications:  .  0.9 %  sodium chloride infusion, 10-20 mL/hr, Intravenous, Continuous, Tilden Fossa, MD, Last Rate: 10 mL/hr at 09/16/15 1900, 10 mL/hr at 09/16/15 1900 .  0.9 %  sodium chloride infusion, 250 mL, Intravenous, PRN, Runell Gess, MD .  acetaminophen (TYLENOL) tablet 650 mg, 650 mg, Oral, Q4H PRN, Runell Gess, MD .  amLODipine (NORVASC) tablet 5 mg, 5 mg, Oral, QHS, Ok Anis, NP, 5 mg at 09/16/15 2115 .  aspirin chewable tablet 81 mg, 81 mg, Oral, Daily, Runell Gess, MD, 81 mg at 09/16/15 0931 .  atorvastatin (LIPITOR) tablet 40 mg, 40 mg, Oral, q1800, Ok Anis, NP, 40 mg at 09/16/15 1800 .  clopidogrel (PLAVIX) tablet 75 mg, 75 mg, Oral, Q breakfast, Runell Gess, MD, 75 mg at 09/16/15 1003 .  dorzolamide-timolol (COSOPT) 22.3-6.8 MG/ML ophthalmic solution 1 drop, 1 drop, Both Eyes, BID, Ok Anis, NP, 1 drop at 09/16/15 2122 .  famotidine (PEPCID) IVPB 20 mg premix, 20 mg, Intravenous, Q6H, Sulaiman Alonna Minium, MD, 20 mg at 09/17/15 0800 .   finasteride (PROSCAR) tablet 5 mg, 5 mg, Oral, Daily, Ok Anis, NP, 5 mg at 09/16/15 0931 .  fluticasone (FLONASE) 50 MCG/ACT nasal spray 2 spray, 2 spray, Each Nare, Daily, Ok Anis, NP, 2 spray at 09/16/15 0933 .  hydrALAZINE (APRESOLINE) injection 10 mg, 10 mg, Intravenous, Q4H PRN, Runell Gess, MD, 10 mg at 09/16/15 0140 .  latanoprost (XALATAN) 0.005 % ophthalmic solution 1 drop, 1 drop, Both Eyes, QHS, Ok Anis, NP, 1 drop at 09/16/15 2121 .  levETIRAcetam (KEPPRA) tablet 250 mg, 250 mg, Oral, Daily, 250 mg at 09/16/15 0931 **AND** levETIRAcetam (KEPPRA) tablet 500 mg, 500 mg, Oral, QHS, Runell Gess, MD, 500 mg at 09/16/15 2115 .  levothyroxine (SYNTHROID, LEVOTHROID) tablet 25 mcg, 25 mcg, Oral, QAC breakfast, Ok Anis, NP, 25 mcg at 09/17/15 0800 .  loratadine (CLARITIN) tablet 10 mg, 10 mg, Oral, Daily, Ok Anis, NP, 10 mg at 09/16/15 0931 .  metoprolol tartrate (LOPRESSOR) tablet 12.5 mg, 12.5 mg, Oral, BID, Ok Anis, NP, 12.5 mg at 09/16/15 2115 .  morphine 2 MG/ML injection 2 mg, 2 mg, Intravenous, Q1H PRN, Runell Gess, MD, 2 mg at 09/15/15 2358 .  multivitamin with minerals tablet 1 tablet, 1 tablet, Oral, Daily, Ok Anis, NP, 1 tablet at 09/16/15 0930 .  nitroGLYCERIN (NITROSTAT) SL tablet 0.4 mg, 0.4 mg, Sublingual, Q5 Min x 3 PRN, Ok Anis, NP .  ondansetron [facility]) injection 4 mg, 4 mg, Intravenous, Q4H PRN, Quintella Reichert, MD, 4 mg at 09/16/15 2250 .  phenol (CHLORASEPTIC) mouth spray 1 spray, 1 spray, Mouth/Throat, PRN, Einar Crow, MD .  polyethylene glycol (MIRALAX / GLYCOLAX) packet 17 g, 17 g, Oral, QHS, Ok Anis, NP .  senna-docusate (Senokot-S) tablet 2 tablet, 2 tablet, Oral, QHS, Ok Anis, NP .  sodium chloride flush (NS) 0.9 % injection 3 mL, 3 mL, Intravenous, Q12H, Runell Gess, MD, 10 mL at 09/16/15 2130 .  sodium chloride flush  (NS) 0.9 % injection 3 mL, 3 mL, Intravenous, PRN, Runell Gess, MD .  traMADol-acetaminophen (ULTRACET) 37.5-325 MG per tablet 1 tablet, 1 tablet, Oral, Q6H PRN, Ok Anis, NP .  zolpidem (AMBIEN) tablet 5 mg, 5 mg, Oral, QHS, Alcario Drought  Brion Aliment, NP, 5 mg at 09/16/15 2115 . sodium chloride 10 mL/hr (09/16/15 1900)    TELE:  V paced, frequent PVC's.       ECG: V paced, PVC's, diffuse ST depression and T wave inversion.    Left heart cath 09/15/15 IMPRESSION: Unsuccessful apical LAD PCI and aspiration thrombectomy in the setting of what appears to be a thromboembolic event probably related to his chronic A. fib not on oral coagulation. His EKG was remarkable for inferior apical ST segment elevation. I did not cross the aortic valve. Echo will be obtained. Medical therapy will be recommended. The guidewire and catheter were removed. The sheath was secured. Intermedics was turned off. The patient left the lab in stable condition.   Radiology/Studies: Dg Chest Port 1 View  Result Date: 09/15/2015 CLINICAL DATA:  Chest pain EXAM: PORTABLE CHEST 1 VIEW COMPARISON:  05/07/2015 chest radiograph. FINDINGS: Stable configuration of single lead left subclavian pacemaker with lead tip overlying the coronary sinus. Stable cardiomediastinal silhouette with mild cardiomegaly. No pneumothorax. No pleural effusion. Mild pulmonary edema. IMPRESSION: Mild congestive heart failure. Electronically Signed   By: Delbert Phenix M.D.   On: 09/15/2015 16:13     Current Medications:  . amLODipine  5 mg Oral QHS  . aspirin  81 mg Oral Daily  . atorvastatin  40 mg Oral q1800  . clopidogrel  75 mg Oral Q breakfast  . dorzolamide-timolol  1 drop Both Eyes BID  . famotidine (PEPCID) IV  20 mg Intravenous Q6H  . finasteride  5 mg Oral Daily  . fluticasone  2 spray Each Nare Daily  . latanoprost  1 drop Both Eyes QHS  . levETIRAcetam  250 mg Oral Daily   And  . levETIRAcetam  500 mg Oral QHS  .  levothyroxine  25 mcg Oral QAC breakfast  . loratadine  10 mg Oral Daily  . metoprolol tartrate  12.5 mg Oral BID  . multivitamin with minerals  1 tablet Oral Daily  . polyethylene glycol  17 g Oral QHS  . senna-docusate  2 tablet Oral QHS  . sodium chloride flush  3 mL Intravenous Q12H  . zolpidem  5 mg Oral QHS   . sodium chloride 10 mL/hr (09/16/15 1900)    ASSESSMENT AND PLAN: Active Problems:   Acute ST elevation myocardial infarction (STEMI) due to occlusion of left coronary artery (HCC)   Acute inferolateral myocardial infarction (HCC)   Acute ST elevation myocardial infarction (STEMI) of lateral wall (HCC)   1. Acute inferolateral STEMI/CAD: Presented on 09/15/15 with 1 hour of severe substernal chest pain. Had significant inferior and anterolateral ST elevation in ED, Code STEMI activated. Cath report above. Had unsuccessful apical LAD PCI and aspiration thrombectomy in the setting of what appears to be a thromboembolic event probably related to his chronic A. fib not on oral coagulation. Started Plavix, continue.   Continues to have frequent ectopy and this am had 9 second run of VT. He was asymptomatic at the time. BP stable. Labs pending.   His VT is likely related to apical occlusion of his LAD. Can increase beta blocker to 25mg  BID and see if this helps his frequent ectopy. Will also start Amio gtt as he had polymorphic VT this am.  Will check hepatic function.   2. Hematemesis: Had multiple episodes of coffee ground emesis. GI consulted. Needs EGD at some point. GI recommends avoiding heparin. Hgb was 10.6 yesterday, CBC pending for today. Ok with GI to give Plavix.  3. Hypertension: Remains hypertensive, on Amlodipine, metoprolol 12.5 BID. Metoprolol increased.   4.  HDL:  Continue statin in setting of ACS.    5.  Cerebrovascular disease:  Pending further eval of residual LICA stenosis.  6. CKD III:  Creat stable @ 1.41. Was on ARB at home, will try to control his BP  without nephrotoxic agents.   7. Swallowing issues: Per nursing patient has coughing following swallowing, will put in Speech eval order.   8. Acute delirium: according to daughter at bedside, patient was disoriented throughout the night. This is new for him. Likely related to hospitalization, Neuro exam shows intact cranial nerves.   Signed, Little IshikawaErin E Smith , NP 8:57 AM 09/17/2015 Pager [phone]  Patient seen, examined. Available data reviewed. Agree with findings, assessment, and plan as outlined by Suzzette RighterErin Smith, NP. On exam the patient is a pleasant elderly male in no acute distress. He is confused but not agitated. JVP is elevated. Lungs are clear. Heart is regular rate and rhythm with a 3/6 holosystolic murmur at the left lower sternal border. Abdomen is soft and nontender. There is no peripheral edema.  I have reviewed this patient's extensive medical issues at length. His granddaughter and caretaker is at the bedside. I have reviewed the patient's cardiac catheterization images and report as well as his lab work, radiographic data, and outpatient notes. Appreciate Dr. Larae GroomsJacob's assessment regarding GI bleeding.  The patient had an episode of polymorphic ventricular tachycardia this morning. This self terminated. We have decided to start IV amiodarone to suppress further arrhythmia. He is currently in ventricular bigeminy with every other beat ventricular paced. He has had an overall decline in his health over the last several weeks. He's been evaluated by multiple outpatient providers with no clear cause of his increasing fatigue, lethargy, and generalized weakness. I agree with Dr. Gery PrayBarry that he likely had an embolic event causing occlusion of the distal LAD.  After a lengthy discussion of the patient's prognosis, quality of life, and complex medical issues, all in the context of his expressed wishes, we have decided to place a consultation to the palliative care team. The patient will be DO  NOT RESUSCITATE in case of any catastrophic event. Will continue current active therapies. He has clearly stated over time based on the granddaughter/caretakers report that he would not want to be on the ventilator or have CPR.  The patient's beta blocker dose is increased. He is started on IV amiodarone. We'll continue to follow his progress closely.  Tonny BollmanMichael Lylianna Fraiser, M.D. 09/17/2015 11:14 AM

## 2015-09-17 NOTE — Telephone Encounter (Signed)
Reviewed. Admitted with MI.

## 2015-09-17 NOTE — Care Management Note (Signed)
Case Management Note  Patient Details  Name: Oscar Santiago MRN: [id number] Date of Birth: 01/21/1926  Subjective/Objective:    Pt admitted with STEMI                Action/Plan:  PTA from home with grand daughter - pt as round the clock caregiver hired in the home.  Palliative Consulted.  CM will continue to follow for discharge needs.   Expected Discharge Date:                  Expected Discharge Plan:     In-House Referral:     Discharge planning Services  CM Consult  Post Acute Care Choice:    Choice offered to:     DME Arranged:    DME Agency:     HH Arranged:    HH Agency:     Status of Service:  In process, will continue to follow  If discussed at Long Length of Stay Meetings, dates discussed:    Additional Comments:  Cherylann ParrClaxton, Leili Eskenazi S, RN 09/17/2015, 2:51 PM

## 2015-09-17 NOTE — Progress Notes (Signed)
[facility] Progress Note  Chief complaint: Coffee ground emesis  Subjective: NGT removed yesterday and denies any more episodes of vomiting. . Per nurse he had one bowel movement last night with no blood or melena. Denies shortness of breath or nausea. Daughter expresses he has no bleeding other than hematuria that is associated with his BPH. Shows willingness to eat something.     Objective:  Vital signs in last 24 hours: Temp:  [97.1 F (36.2 C)-97.9 F (36.6 C)] 97.9 F (36.6 C) (08/14 0830) Pulse Rate:  [59-119] 89 (08/14 0800) Resp:  [10-30] 24 (08/14 0700) BP: (112-171)/(53-114) 160/60 (08/14 0800) SpO2:  [94 %-100 %] 99 % (08/14 0700) Weight:  [182 lb 12.2 oz (82.9 kg)-184 lb 15.5 oz (83.9 kg)] 182 lb 12.2 oz (82.9 kg) (08/14 0500) Last BM Date: 09/17/15 General:   Alert,  Well-developed,    in NAD Heart:  Regular rate and rhythm; no murmurs Pulm: Clear to auscultation bilaterally Abdomen:  Soft, nontender and nondistended. Normal bowel sounds, without guarding, and without rebound.   Extremities:  Without edema. Neurologic:  Alert and  oriented x4;  grossly normal neurologically. Psych:  Alert and cooperative. Normal mood and affect.   Lab Results:  Recent Labs  09/15/15 2222 09/16/15 0418 09/16/15 1539  WBC 4.6 4.8 4.7  HGB 10.7* 11.1* 10.6*  HCT 32.5* 34.0* 33.3*  PLT 136* 137* 137*   BMET  Recent Labs  09/15/15 1604 09/15/15 1613 09/16/15 0418  NA 132* 137 135  K 4.2 4.4 4.6  CL 102 105 102  CO2 18*  --  20*  GLUCOSE 126* 123* 131*  BUN 18 21* 20  CREATININE 1.46* 1.40* 1.41*  CALCIUM 9.2  --  9.0   LFT  Recent Labs  09/15/15 1604  PROT 8.0  ALBUMIN 3.9  AST 87*  ALT 45  ALKPHOS 82  BILITOT 1.0   PT/INR  Recent Labs  09/15/15 1604  LABPROT 17.5*  INR 1.42   Hepatitis Panel No results for input(s): HEPBSAG, HCVAB, HEPAIGM, HEPBIGM in the last 72 hours.  Dg Chest Port 1 View  Result Date:  09/15/2015 CLINICAL DATA:  Chest pain EXAM: PORTABLE CHEST 1 VIEW COMPARISON:  05/07/2015 chest radiograph. FINDINGS: Stable configuration of single lead left subclavian pacemaker with lead tip overlying the coronary sinus. Stable cardiomediastinal silhouette with mild cardiomegaly. No pneumothorax. No pleural effusion. Mild pulmonary edema. IMPRESSION: Mild congestive heart failure. Electronically Signed   By: Delbert PhenixJason A Poff M.D.   On: 09/15/2015 16:13    Assessment: *  CG emesis in setting of Plavix, ASA for acute MI.  *  Acute MI.  S/p incomplete PCI.    *  Chronic early satiety.  Anorexia.  Chronic diarrhea.  Colonoscopy 2016 negative.  *  Atrial Fib.    Plan: Advance diet to heart healthy.    LOS: 2 days   Debbra RidingJason Whitaker PA-S 09/17/2015, 9:05 AM    ________________________________________________________________________  [facility] MD note:  I personally examined the patient, reviewed the data and agree with the assessment and plan described above.  NG removed last night, he was pulling at it, very agitated from it, unable to sleep.  This morning he had 9 seconds of Vtach.  He has had no further overt GI bleeding, still on ASA and plavix due to recent AMI.  He is on pepcid Q 6 hours.  He's confused this morning, hallucinating about family/events from the past.  Agree with advancing diet. Will  follow clinically, for now I'm not planning to proceed with EGD unless overt bleeding resumes or if he cannot advance diet.    Rob Buntinganiel Jolean Madariaga, MD [facility] Pager [phone]

## 2015-09-18 ENCOUNTER — Inpatient Hospital Stay (HOSPITAL_COMMUNITY): Payer: Medicare Other

## 2015-09-18 DIAGNOSIS — I2109 ST elevation (STEMI) myocardial infarction involving other coronary artery of anterior wall: Secondary | ICD-10-CM

## 2015-09-18 LAB — MAGNESIUM: MAGNESIUM: 2.1 mg/dL (ref 1.7–2.4)

## 2015-09-18 LAB — CBC
HCT: 31.5 % — ABNORMAL LOW (ref 39.0–52.0)
Hemoglobin: 10.2 g/dL — ABNORMAL LOW (ref 13.0–17.0)
MCH: 24.6 pg — ABNORMAL LOW (ref 26.0–34.0)
MCHC: 32.4 g/dL (ref 30.0–36.0)
MCV: 76.1 fL — ABNORMAL LOW (ref 78.0–100.0)
Platelets: 117 10*3/uL — ABNORMAL LOW (ref 150–400)
RBC: 4.14 MIL/uL — ABNORMAL LOW (ref 4.22–5.81)
RDW: 18.7 % — AB (ref 11.5–15.5)
WBC: 9.8 10*3/uL (ref 4.0–10.5)

## 2015-09-18 LAB — ECHOCARDIOGRAM COMPLETE
HEIGHTINCHES: 69.5 in
Weight: 2994.73 oz

## 2015-09-18 LAB — BASIC METABOLIC PANEL
Anion gap: 8 (ref 5–15)
BUN: 27 mg/dL — ABNORMAL HIGH (ref 6–20)
CALCIUM: 8.5 mg/dL — AB (ref 8.9–10.3)
CO2: 21 mmol/L — ABNORMAL LOW (ref 22–32)
CREATININE: 1.68 mg/dL — AB (ref 0.61–1.24)
Chloride: 105 mmol/L (ref 101–111)
GFR calc non Af Amer: 35 mL/min — ABNORMAL LOW (ref >60)
GFR, EST AFRICAN AMERICAN: 40 mL/min — AB (ref >60)
Glucose, Bld: 112 mg/dL — ABNORMAL HIGH (ref 65–99)
Potassium: 4.2 mmol/L (ref 3.5–5.1)
SODIUM: 134 mmol/L — AB (ref 135–145)

## 2015-09-18 LAB — URINE CULTURE

## 2015-09-18 MED ORDER — POLYETHYLENE GLYCOL 3350 17 G PO PACK: 17.0000 g | PACK | Freq: Every evening | ORAL | Status: DC | PRN

## 2015-09-18 MED ORDER — PANTOPRAZOLE SODIUM 40 MG PO TBEC
40.0000 mg | DELAYED_RELEASE_TABLET | Freq: Every day | ORAL | Status: DC
  Administered 2015-09-18 – 2015-09-21 (×4): 40 mg via ORAL
  Filled 2015-09-18 (×4): qty 1

## 2015-09-18 MED ORDER — AMIODARONE HCL 200 MG PO TABS
400.0000 mg | ORAL_TABLET | Freq: Two times a day (BID) | ORAL | Status: DC
  Administered 2015-09-18 – 2015-09-21 (×7): 400 mg via ORAL
  Filled 2015-09-18 (×7): qty 2

## 2015-09-18 NOTE — Care Management Important Message (Signed)
Important Message  Patient Details  Name: Oscar Santiago N Keithly MRN: [id number] Date of Birth: 06/18/1925   Medicare Important Message Given:  Yes    Chloe Baig Abena 09/18/2015, 10:35 AM

## 2015-09-18 NOTE — Evaluation (Signed)
Physical Therapy Evaluation Patient Details Name: Oscar Santiago MRN: [id number] DOB: 09/04/1925 Today's Date: 09/18/2015   History of Present Illness  Pt adm with STEMI. Pt also with possible GI bleed. PMH - Afib, CVA, seizures, depression, chf, ckd, pacer,   Clinical Impression  Pt admitted with above diagnosis and presents to PT with functional limitations due to deficits listed below (See PT problem list). Pt needs skilled PT to maximize independence and safety to allow discharge to home with caregiver and family providing 24 hour care.      Follow Up Recommendations Home health PT;Supervision/Assistance - 24 hour    Equipment Recommendations  None recommended by PT    Recommendations for Other Services       Precautions / Restrictions Precautions Precautions: Fall Restrictions Weight Bearing Restrictions: No      Mobility  Bed Mobility               General bed mobility comments: Pt up in chairl  Transfers Overall transfer level: Needs assistance Equipment used: Rolling walker (2 wheeled) Transfers: Sit to/from Stand Sit to Stand: Min assist         General transfer comment: Assist for balance.  Ambulation/Gait Ambulation/Gait assistance: Min guard Ambulation Distance (Feet): 170 Feet Assistive device: Rolling walker (2 wheeled) Gait Pattern/deviations: Step-through pattern;Decreased step length - right;Decreased step length - left;Trunk flexed Gait velocity: slow Gait velocity interpretation: <1.8 ft/sec, indicative of risk for recurrent falls General Gait Details: Assist for safety. Verbal cues to initially stay closer to walker. Amb on RA with SpO2 after amb 95%.  Dyspnea 3/4.  Stairs            Wheelchair Mobility    Modified Rankin (Stroke Patients Only)       Balance Overall balance assessment: Needs assistance Sitting-balance support: No upper extremity supported Sitting balance-Leahy Scale: Fair     Standing balance support:  Bilateral upper extremity supported Standing balance-Leahy Scale: Poor Standing balance comment: support of walker and supervision for static standing                             Pertinent Vitals/Pain Pain Assessment: No/denies pain    Home Living Family/patient expects to be discharged to:: Private residence Living Arrangements: Children;Spouse/significant other;Other relatives (granddaughter is primary caregiver) Available Help at Discharge: Family;Available 24 hours/day Type of Home: House Home Access: Ramped entrance     Home Layout: Two level;Laundry or work area in basement;Able to live on main level with bedroom/bathroom Home Equipment: Gilmer MorCane - single point;Bedside commode;Walker - 2 wheels;Grab bars - tub/shower Additional Comments: Has a lift chair, but does not use; hospital bed rail    Prior Function Level of Independence: Independent with assistive device(s)         Comments: Amb with cane or walker. Mobility has declined over past several weeks.     Hand Dominance   Dominant Hand: Right    Extremity/Trunk Assessment   Upper Extremity Assessment: Generalized weakness           Lower Extremity Assessment: Generalized weakness         Communication   Communication: HOH  Cognition Arousal/Alertness: Awake/alert Behavior During Therapy: WFL for tasks assessed/performed Overall Cognitive Status: Within Functional Limits for tasks assessed                      General Comments      Exercises  Assessment/Plan    PT Assessment Patient needs continued PT services  PT Diagnosis Difficulty walking;Generalized weakness   PT Problem List Decreased strength;Decreased activity tolerance;Decreased balance;Decreased mobility  PT Treatment Interventions DME instruction;Gait training;Functional mobility training;Therapeutic activities;Therapeutic exercise;Balance training;Patient/family education   PT Goals (Current goals can be  found in the Care Plan section) Acute Rehab PT Goals Patient Stated Goal: not stated PT Goal Formulation: With patient Time For Goal Achievement: 09/25/15 Potential to Achieve Goals: Good    Frequency Min 3X/week   Barriers to discharge        Co-evaluation               End of Session   Activity Tolerance: Patient limited by fatigue Patient left: in chair;with call bell/phone within reach;with family/visitor present Nurse Communication: Mobility status         Time: [id number] PT Time Calculation (min) (ACUTE ONLY): 19 min   Charges:   PT Evaluation $PT Eval Moderate Complexity: 1 Procedure     PT G Codes:        Shirly Bartosiewicz 09/18/2015, 3:46 PM [facility] [phone]

## 2015-09-18 NOTE — Progress Notes (Signed)
CARDIAC REHAB PHASE I   PRE:  Rate/Rhythm: 78 pacing with bigimeny    BP: sitting 119/74    SaO2: 96 RA  MODE:  Ambulation: 100 ft   POST:  Rate/Rhythm: 92 pacing with bigimeny/PVCs    BP: sitting 130/86     SaO2: 97 RA  Pt with slow movements but able stand with min assist and walk with RW. Assist x2 for safety. Pt with more bent posture than is normal for him. His caregiver sts he walks daily with cane, approximately 1500 ft at a time. Pt had 3 bts VT at a time at the most while walking. Rhythm really did not change much. To recliner. Pt without c/o. [id number]   Harriet Massonandi Kristan Samyak Sackmann CES, ACSM 09/18/2015 11:51 AM

## 2015-09-18 NOTE — Significant Event (Signed)
Patient transferred safely to 3W26, taken via wheelchair. VS stable prior and during the transfer. All personal belongings taken with patient. Patient's private home attendant, brother, and other family members at bedside.Grand-daughter Junious DresserConnie is updated by RN and made aware of room number. Receiving staff in room prior to RN leaving.

## 2015-09-18 NOTE — Progress Notes (Signed)
Daily Rounding Note  09/18/2015, 10:06 AM  LOS: 3 days   SUBJECTIVE:   Chief complaint: cg emesis     No nausea or vomiting >48 hours.  Eating solids but anorexic.  Remains lethargic, weak though confusion improved  OBJECTIVE:         Vital signs in last 24 hours:    Temp:  [97.5 F (36.4 C)-98.5 F (36.9 C)] 97.9 F (36.6 C) (08/15 0700) Pulse Rate:  [42-118] 80 (08/15 0800) Resp:  [13-37] 22 (08/15 0800) BP: (121-147)/(58-79) 135/79 (08/15 0600) SpO2:  [96 %-100 %] 99 % (08/15 0800) Weight:  [84.9 kg (187 lb 2.7 oz)] 84.9 kg (187 lb 2.7 oz) (08/15 0600) Last BM Date: 09/16/15 Filed Weights   09/16/15 1500 09/17/15 0500 09/18/15 0600  Weight: 83.9 kg (184 lb 15.5 oz) 82.9 kg (182 lb 12.2 oz) 84.9 kg (187 lb 2.7 oz)   General: looks unwell, slept for most of the exam   Heart: RRR Chest: clear bil.   Abdomen: soft, active BS.  ND, NT  Extremities: no CCE Neuro/Psych:  Awakens to voice, responds appropriately, follows commands.  Calm and somnolent.   Intake/Output from previous day: 08/14 0701 - 08/15 0700 In: 1326.7 [P.O.:400; I.V.:726.7; IV Piggyback:200] Out: 300 [Urine:300]  Intake/Output this shift: Total I/O In: 16.7 [I.V.:16.7] Out: 150 [Urine:150]  Lab Results:  Recent Labs  09/16/15 1539 09/17/15 0920 09/18/15 0312  WBC 4.7 9.5 9.8  HGB 10.6* 11.1* 10.2*  HCT 33.3* 34.8* 31.5*  PLT 137* 135* 117*   BMET  Recent Labs  09/16/15 0418 09/17/15 0920 09/18/15 0312  NA 135 137 134*  K 4.6 4.5 4.2  CL 102 106 105  CO2 20* 23 21*  GLUCOSE 131* 139* 112*  BUN 20 23* 27*  CREATININE 1.41* 1.62* 1.68*  CALCIUM 9.0 9.1 8.5*   LFT  Recent Labs  09/15/15 1604 09/17/15 0935  PROT 8.0 7.2  ALBUMIN 3.9 3.5  AST 87* 173*  ALT 45 57  ALKPHOS 82 69  BILITOT 1.0 0.9  BILIDIR  --  0.3  IBILI  --  0.6   PT/INR  Recent Labs  09/15/15 1604  LABPROT 17.5*  INR 1.42   Hepatitis  Panel No results for input(s): HEPBSAG, HCVAB, HEPAIGM, HEPBIGM in the last 72 hours.  Studies/Results: No results found.   Scheduled Meds: . amiodarone  400 mg Oral BID  . amLODipine  5 mg Oral QHS  . aspirin  81 mg Oral Daily  . atorvastatin  40 mg Oral q1800  . clopidogrel  75 mg Oral Q breakfast  . dorzolamide-timolol  1 drop Both Eyes BID  . famotidine (PEPCID) IV  20 mg Intravenous Q6H  . fluticasone  2 spray Each Nare Daily  . latanoprost  1 drop Both Eyes QHS  . levETIRAcetam  250 mg Oral Daily   And  . levETIRAcetam  500 mg Oral QHS  . levothyroxine  25 mcg Oral QAC breakfast  . loratadine  10 mg Oral Daily  . metoprolol tartrate  25 mg Oral BID  . multivitamin with minerals  1 tablet Oral Daily  . senna-docusate  2 tablet Oral QHS  . sodium chloride flush  3 mL Intravenous Q12H  . zolpidem  5 mg Oral QHS   Continuous Infusions: . sodium chloride 10 mL/hr (09/17/15 0800)   PRN Meds:.sodium chloride, acetaminophen, hydrALAZINE, morphine injection, nitroGLYCERIN, ondansetron (ZOFRAN) IV, phenol, polyethylene glycol, sodium chloride flush,  traMADol-acetaminophen  ASSESMENT:   * CG emesis in setting of Plavix, ASA for acute MI.   On q 6 hour IV pepcid.   * Acute MI. S/p incomplete PCI.  Remains on Plavix, ASA.   *  Microcytic anemia.  Low iron and low ferritin.  No transfusions to date. Negative colonoscopy in 2016.   *  Thrombocytopenia.    * Chronic early satiety. Anorexia. Chronic diarrhea. Colonoscopy 2016 negative.  * Atrial Fib, brief V tach.   *  AMS, improved  *  AKI, CKD.     PLAN   *  Unless overt bleeding or acute drop Hgb, no plans for EGD.   Stopped Pepcid and started daily Protonix.      Jennye MoccasinSarah Gribbin  09/18/2015, 10:06 AM Pager: [phone]   ________________________________________________________________________  [facility] MD note:  I personally examined the patient, reviewed the data and agree with the assessment and  plan described above. He had minor CGE in setting of acute MI, ASA, plavix.  His is frail and his underlying CAD is unchanged and he's having intermittent VT bouts. Agree with palliative care input.  No plans for invasive GI testing.  Please keep him on once daily PPI, encourage PO intake, ambulation.    Please call or page with any further questions or concerns.    Rob Buntinganiel Jalynn Waddell, MD [facility] Pager [phone]

## 2015-09-18 NOTE — Progress Notes (Signed)
    Subjective:  Feeling better today. Chest pressure nearly gone. No dyspnea at rest. Granddaughter at bedside. Pt sitting up in chair.   Objective:  Vital Signs in the last 24 hours: Temp:  [97.5 F (36.4 C)-98.5 F (36.9 C)] 97.7 F (36.5 C) (08/14 2350) Pulse Rate:  [41-118] 118 (08/15 0700) Resp:  [12-37] 27 (08/15 0700) BP: (121-168)/(57-79) 135/79 (08/15 0600) SpO2:  [96 %-100 %] 98 % (08/15 0700) Weight:  [84.9 kg (187 lb 2.7 oz)] (P) 84.9 kg (187 lb 2.7 oz) (08/15 0600)  Intake/Output from previous day: 08/14 0701 - 08/15 0700 In: 1326.7 [P.O.:400; I.V.:726.7; IV Piggyback:200] Out: 300 [Urine:300]  Physical Exam: Pt is alert and oriented, pleasant elderly male in NAD HEENT: normal Neck: JVP - elevated with large V waves Lungs: CTA bilaterally CV: RRR with 3/6 systolic murmur at the LSB Abd: soft, NT, Positive BS, no hepatomegaly Ext: no C/C/E, distal pulses intact and equal Skin: warm/dry no rash   Lab Results:  Recent Labs  09/17/15 0920 09/18/15 0312  WBC 9.5 9.8  HGB 11.1* 10.2*  PLT 135* 117*    Recent Labs  09/17/15 0920 09/18/15 0312  NA 137 134*  K 4.5 4.2  CL 106 105  CO2 23 21*  GLUCOSE 139* 112*  BUN 23* 27*  CREATININE 1.62* 1.68*    Recent Labs  09/15/15 2011 09/16/15 0418  TROPONINI 0.29* 5.50*    Cardiac Studies: 2D Echo pending  Tele: V-paced, ventricular bigeminy  Assessment/Plan:  1. Acute anterolateral STEMI secondary to distal LAD occlusion, possible embolic event. Medical management with ASA, plavix. Oral anticoagulation contraindicated. Tolerating beta-blocker - dose increased yesterday.   2. NSVT: improved on amiodarone. Change IV to PO amiodarone. Likely treat for short period 4-6 weeks as long as LV function is not severely depressed.  3. Hematemesis: appreciate GI evaluation. Observation planned  4. Delirium: improved. Mental status much better today.   Dispo: tx 3W stepdown when bed available, cardiac  rehab/PT consult, appreciate palliative care evaluation.   Tonny Bollmanooper, Moana Munford, M.D. 09/18/2015, 7:39 AM

## 2015-09-18 NOTE — Evaluation (Signed)
Clinical/Bedside Swallow Evaluation Patient Details  Name: Oscar Santiago N Elizarraraz MRN: [id number] Date of Birth: 03/27/1925  Today's Date: 09/18/2015 Time: SLP Start Time (ACUTE ONLY): 1114 SLP Stop Time (ACUTE ONLY): 1129 SLP Time Calculation (min) (ACUTE ONLY): 15 min  Past Medical History:  Past Medical History:  Diagnosis Date  . Carotid arterial disease (HCC)    a. s/p R CEA with residual LICA 80% stenosis pending further eval.  . Chronic systolic dysfunction of left ventricle    a. EF 30-35% by echo 10/10/2009;  b. 10/2014 Echo: EF 40%, m ild LVH, mod MR, sev dil LA, mildly reduced RV fxn, mod TR, PASP 35mmHg.  . CKD (chronic kidney disease), stage III   . Complete heart block (HCC)    a. s/p MDT single chamber PPM - initially placed in 2008 w/ gen change in 2016.  Marland Kitchen. Compression fracture of spine (HCC)    T 10 and T11  . Glaucoma   . Gout   . Hearing loss    bilateral -hearing aids  . Hemorrhoid   . History of GI bleed    from coumadin  . Hyperlipidemia   . Hypertension   . Hypothyroid   . Inguinal hernia   . Kidney stone   . Major depression (HCC)   . MR (mitral regurgitation)   . OSA (obstructive sleep apnea)    on CPAP therapy  . Osteopenia   . Permanent atrial fibrillation (HCC)    a. refuses coumadin due to history of GI bleed - was prev Rx eliquis->never took.  . Seizure disorder (HCC)   . Stroke [facility](HCC)    Past Surgical History:  Past Surgical History:  Procedure Laterality Date  . CARDIAC CATHETERIZATION  2002  . CARDIAC CATHETERIZATION N/A 09/15/2015   Procedure: Coronary Balloon Angioplasty;  Surgeon: Runell GessJonathan J Berry, MD;  [facility];  Service: Cardiovascular;  Laterality: N/A;  . CARDIAC CATHETERIZATION N/A 09/15/2015   Procedure: Coronary/Graft Angiography;  Surgeon: Runell GessJonathan J Berry, MD;  Location: [facility] INVASIVE CV LAB;  Service: Cardiovascular;  Laterality: N/A;  . COLONOSCOPY WITH PROPOFOL N/A 05/01/2014   Procedure: COLONOSCOPY WITH PROPOFOL;   Surgeon: Iva Booparl E Gessner, MD;  Location: [facility] ENDOSCOPY;  Service: Endoscopy;  Laterality: N/A;  . ENDARTERECTOMY Right 10/23/2014   Procedure: ENDARTERECTOMY CAROTID;  Surgeon: Larina Earthlyodd F Early, MD;  [facility];  Service: Vascular;  Laterality: Right;  . EP IMPLANTABLE DEVICE N/A 12/26/2014   Procedure: PPM Generator Changeout;  Surgeon: Marinus MawGregg W Taylor, MD;  [facility];  Service: Cardiovascular;  Laterality: N/A;  . HEMORRHOID SURGERY    . INGUINAL HERNIA REPAIR    . INSERTION OF SUPRAPUBIC CATHETER N/A 04/05/2015   Procedure: INSERTION OF SUPRAPUBIC CATHETER;  Surgeon: Jethro BolusSigmund Tannenbaum, MD;  Location: [facility] ORS;  Service: Urology;  Laterality: N/A;  . PACEMAKER INSERTION  07/28/06   VVI pacemaker for complete heart block by Dr Amil AmenEdmunds  . TRANSURETHRAL RESECTION OF PROSTATE N/A 04/05/2015   Procedure: TRANSURETHRAL RESECTION OF THE PROSTATE (TURP);  Surgeon: Jethro BolusSigmund Tannenbaum, MD;  Location: [facility] ORS;  Service: Urology;  Laterality: N/A;   HPI:  82 year old man with admitted wtih STEMI s/p cardiac cath with hematemesis concerning for GI bleed. Pt had prior swallow evaluations in 11/2014 and 05/2015 both recommending regular diet and thin liquids. PMH: CHF, CAD, CVA, a fib   Assessment / Plan / Recommendation Clinical Impression  Pt has audible swallows and mildly prolonged mastication, but with no overt signs concerning  for aspiration across various consistencies tested. He and his daughter voice no subjective concerns. Recommend that pt continue with current diet with use of general aspiration and esophageal precautions. No acute SLP needs identified.    Aspiration Risk  Mild aspiration risk    Diet Recommendation Regular;Thin liquid   Liquid Administration via: Cup;Straw Medication Administration: Whole meds with liquid Supervision: Patient able to self feed;Intermittent supervision to cue for compensatory strategies Compensations: Slow rate;Small sips/bites;Follow solids with  liquid Postural Changes: Seated upright at 90 degrees;Remain upright for at least 30 minutes after po intake    Other  Recommendations Oral Care Recommendations: Oral care BID   Follow up Recommendations  None    Frequency and Duration            Prognosis        Swallow Study   General HPI: 82 year old man with admitted wtih STEMI s/p cardiac cath with hematemesis concerning for GI bleed. Pt had prior swallow evaluations in 11/2014 and 05/2015 both recommending regular diet and thin liquids. PMH: CHF, CAD, CVA, a fib Type of Study: Bedside Swallow Evaluation Previous Swallow Assessment: see HPI Diet Prior to this Study: Regular;Thin liquids Temperature Spikes Noted: No Respiratory Status: Room air History of Recent Intubation: No Behavior/Cognition: Alert;Cooperative;Pleasant mood Oral Cavity Assessment: Within Functional Limits Oral Care Completed by SLP: No Oral Cavity - Dentition: Adequate natural dentition Vision: Functional for self-feeding Self-Feeding Abilities: Able to feed self;Needs assist Patient Positioning: Upright in bed Baseline Vocal Quality: Low vocal intensity    Oral/Motor/Sensory Function Overall Oral Motor/Sensory Function: Within functional limits   Ice Chips Ice chips: Not tested   Thin Liquid Thin Liquid: Within functional limits Presentation: Self Fed;Cup;Straw    Nectar Thick Nectar Thick Liquid: Not tested   Honey Thick Honey Thick Liquid: Not tested   Puree Puree: Within functional limits Presentation: Self Fed;Spoon   Solid   GO   Solid: Within functional limits Presentation: Self Georjean ModeFed        Paiewonsky, Merrin Mcvicker 09/18/2015,12:13 PM  Maxcine HamLaura Paiewonsky, M.A. CCC-SLP [phone]

## 2015-09-18 NOTE — Progress Notes (Signed)
  Echocardiogram 2D Echocardiogram has been performed.  Leta JunglingCooper, Nikolina Simerson M 09/18/2015, 1:50 PM

## 2015-09-19 ENCOUNTER — Inpatient Hospital Stay (HOSPITAL_COMMUNITY): Payer: Medicare Other

## 2015-09-19 MED ORDER — CARVEDILOL 3.125 MG PO TABS
3.1250 mg | ORAL_TABLET | Freq: Two times a day (BID) | ORAL | Status: DC
  Administered 2015-09-19 – 2015-09-21 (×4): 3.125 mg via ORAL
  Filled 2015-09-19 (×4): qty 1

## 2015-09-19 MED ORDER — DM-GUAIFENESIN ER 30-600 MG PO TB12
1.0000 | ORAL_TABLET | Freq: Two times a day (BID) | ORAL | Status: DC
Start: 2015-09-19 — End: 2015-09-21
  Administered 2015-09-19 – 2015-09-21 (×5): 1 via ORAL
  Filled 2015-09-19 (×5): qty 1

## 2015-09-19 NOTE — Progress Notes (Signed)
1515 Have offered twice to walk with pt. Pt has been sleepy and stated prefer to not walk today. Will continue to follow. Care giver stated pt has been really tired since his second walk yesterday. Luetta NuttingCharlene Tatanisha Cuthbert RN BSN 09/19/2015 3:14 PM

## 2015-09-19 NOTE — Care Management Note (Addendum)
Case Management Note  Patient Details  Name: Oscar Santiago MRN: [id number] Date of Birth: May 12, 1925  Subjective/Objective: Pt presented-82 year old male with a past medical history of chronic systolic CHF (EF 67% in Sept. 2016), CHB s/p PPM, carotid disease s/p R CEA, HTN, HLD, CVA, and permanent Afib not on [place] 2/2 h/o GIB who presented with acute inferolateral STEMI. Pt has DME: RW, Cane and BSC.                 Action/Plan: Pt is from home with family support. Plan will be for home with [place] and pt will have 24 hour supervision. Agency list provided and pt/ family states he has had [facility] in the past. Family wants to use again. Referral was made and SOC to begin within 24-48 hours post d/c. No further needs from CM at this time.   Expected Discharge Date:                  Expected Discharge Plan:  [place]  In-House Referral:  NA  Discharge planning Services  CM Consult  Post Acute Care Choice:  Home Health, Durable Medical Equipment Choice offered to:  Patient  DME Arranged: Hospital Bed  DME Agency:  [place] Arranged:  RN, PT, Nurse's Aide [place] Agency:  [place]  Status of Service:  Completed, signed off  If discussed at [place] of Stay Meetings, dates discussed:    Additional Comments: 1223 09-19-15 Jacqlyn Krauss, RN,BSN [phone] Daughter called back and stated that pt would benefit from a Hospital Bed. CM did speak with RN for MD to place order due to qualifications that need to be met. CM did make referral with [facility] in ref to bed. [facility] to contact daughter with delivery times. No further needs from CM at this time.  Bethena Roys, RN 09/19/2015, 11:23 AM

## 2015-09-19 NOTE — Progress Notes (Signed)
Patient Name: Oscar Santiago Date of Encounter: 09/19/2015  Active Problems:   Acute ST elevation myocardial infarction (STEMI) due to occlusion of left coronary artery (HCC)   Acute inferolateral myocardial infarction (HCC)   Acute ST elevation myocardial infarction (STEMI) of lateral wall (HCC)   ST elevation myocardial infarction (STEMI) [facility])   Primary Cardiologist: Dr. Katrinka Blazing Patient Profile: 82 year old male with a past medical history of chronic systolic CHF (EF 40% in Sept. 9811), CHB s/p PPM, carotid disease s/p R CEA, HTN, HLD, CVA, and permanent Afib not on OAC 2/2 h/o GIB who presented with acute inferolateral STEMI.    SUBJECTIVE:Resting comfortably, daughter at bedside. Denies chest pain.    OBJECTIVE Vitals:   09/18/15 2117 09/19/15 0015 09/19/15 0400 09/19/15 0734  BP: (!) 135/42 (!) 126/47 128/65 135/69  Pulse: 80 (!) 51 (!) 47 (!) 46  Resp: 16 16 16 17   Temp: 98.9 F (37.2 C) 97.7 F (36.5 C) 98.6 F (37 C) 98.3 F (36.8 C)  TempSrc: Oral Oral Oral Oral  SpO2: 100% 98% 98% 100%  Weight:   182 lb 11.2 oz (82.9 kg)   Height:        Intake/Output Summary (Last 24 hours) at 09/19/15 0946 Last data filed at 09/19/15 0845  Gross per 24 hour  Intake            540.1 ml  Output                0 ml  Net            540.1 ml   Filed Weights   09/17/15 0500 09/18/15 0600 09/19/15 0400  Weight: 182 lb 12.2 oz (82.9 kg) 187 lb 2.7 oz (84.9 kg) 182 lb 11.2 oz (82.9 kg)    PHYSICAL EXAM General: Well developed, well nourished, male in no acute distress. Head: Normocephalic, atraumatic.  Neck: Supple without bruits, no JVD. Lungs:  Resp regular and unlabored, CTA. Heart: RRR, S1, S2, no S3, S4, or murmur; no rub. Abdomen: Soft, non-tender, non-distended, BS + x 4.  Extremities: No clubbing, cyanosis, no edema.  Neuro: Alert and oriented X 3. Moves all extremities spontaneously. Psych: Normal affect.  LABS: CBC: Recent Labs  09/17/15 0920  09/18/15 0312  WBC 9.5 9.8  HGB 11.1* 10.2*  HCT 34.8* 31.5*  MCV 78.4 76.1*  PLT 135* 117*  Basic Metabolic Panel: Recent Labs  09/17/15 0920 09/18/15 0312  NA 137 134*  K 4.5 4.2  CL 106 105  CO2 23 21*  GLUCOSE 139* 112*  BUN 23* 27*  CREATININE 1.62* 1.68*  CALCIUM 9.1 8.5*  MG 2.2 2.1   Liver Function Tests: Recent Labs  09/17/15 0935  AST 173*  ALT 57  ALKPHOS 69  BILITOT 0.9  PROT 7.2  ALBUMIN 3.5   Anemia Panel: Recent Labs  09/16/15 1539  FERRITIN 23*  TIBC 363  IRON 20*     Current Facility-Administered Medications:  .  0.9 %  sodium chloride infusion, 10-20 mL/hr, Intravenous, Continuous, Tilden Fossa, MD, Stopped at 09/18/15 1200 .  0.9 %  sodium chloride infusion, 250 mL, Intravenous, PRN, Runell Gess, MD .  acetaminophen (TYLENOL) tablet 650 mg, 650 mg, Oral, Q4H PRN, Runell Gess, MD .  amiodarone (PACERONE) tablet 400 mg, 400 mg, Oral, BID, Tonny Bollman, MD, 400 mg at 09/18/15 2150 .  amLODipine (NORVASC) tablet 5 mg, 5 mg, Oral, QHS, Ok Anis, NP, 5 mg  at 09/18/15 2150 .  aspirin chewable tablet 81 mg, 81 mg, Oral, Daily, Runell GessJonathan J Berry, MD, 81 mg at 09/18/15 0911 .  atorvastatin (LIPITOR) tablet 40 mg, 40 mg, Oral, q1800, Ok Anishristopher R Berge, NP, 40 mg at 09/18/15 1642 .  clopidogrel (PLAVIX) tablet 75 mg, 75 mg, Oral, Q breakfast, Runell GessJonathan J Berry, MD, 75 mg at 09/19/15 0752 .  dorzolamide-timolol (COSOPT) 22.3-6.8 MG/ML ophthalmic solution 1 drop, 1 drop, Both Eyes, BID, Ok Anishristopher R Berge, NP, 1 drop at 09/18/15 2156 .  fluticasone (FLONASE) 50 MCG/ACT nasal spray 2 spray, 2 spray, Each Nare, Daily, Ok Anishristopher R Berge, NP, 2 spray at 09/18/15 0910 .  hydrALAZINE (APRESOLINE) injection 10 mg, 10 mg, Intravenous, Q4H PRN, Runell GessJonathan J Berry, MD, 10 mg at 09/16/15 0140 .  latanoprost (XALATAN) 0.005 % ophthalmic solution 1 drop, 1 drop, Both Eyes, QHS, Ok Anishristopher R Berge, NP, 1 drop at 09/18/15 2155 .  levETIRAcetam  (KEPPRA) tablet 250 mg, 250 mg, Oral, Daily, 250 mg at 09/18/15 0910 **AND** levETIRAcetam (KEPPRA) tablet 500 mg, 500 mg, Oral, QHS, Runell GessJonathan J Berry, MD, 500 mg at 09/18/15 2149 .  levothyroxine (SYNTHROID, LEVOTHROID) tablet 25 mcg, 25 mcg, Oral, QAC breakfast, Ok Anishristopher R Berge, NP, 25 mcg at 09/19/15 0752 .  loratadine (CLARITIN) tablet 10 mg, 10 mg, Oral, Daily, Ok Anishristopher R Berge, NP, 10 mg at 09/18/15 0911 .  metoprolol tartrate (LOPRESSOR) tablet 25 mg, 25 mg, Oral, BID, Little IshikawaErin E Smith, NP, 25 mg at 09/18/15 2150 .  morphine 2 MG/ML injection 2 mg, 2 mg, Intravenous, Q1H PRN, Runell GessJonathan J Berry, MD, 2 mg at 09/15/15 2358 .  multivitamin with minerals tablet 1 tablet, 1 tablet, Oral, Daily, Ok Anishristopher R Berge, NP, 1 tablet at 09/18/15 0910 .  nitroGLYCERIN (NITROSTAT) SL tablet 0.4 mg, 0.4 mg, Sublingual, Q5 Min x 3 PRN, Ok Anishristopher R Berge, NP .  ondansetron [facility](ZOFRAN) injection 4 mg, 4 mg, Intravenous, Q4H PRN, Quintella Reichertraci R Turner, MD, 4 mg at 09/16/15 2250 .  pantoprazole (PROTONIX) EC tablet 40 mg, 40 mg, Oral, Q0600, Lindalou HoseSarah J Gribbin, PA-C, 40 mg at 09/19/15 0543 .  phenol (CHLORASEPTIC) mouth spray 1 spray, 1 spray, Mouth/Throat, PRN, Einar CrowSulaiman Aziz Rathore, MD .  polyethylene glycol (MIRALAX / GLYCOLAX) packet 17 g, 17 g, Oral, QHS PRN, Runell GessJonathan J Berry, MD .  senna-docusate (Senokot-S) tablet 2 tablet, 2 tablet, Oral, QHS, Ok Anishristopher R Berge, NP, 2 tablet at 09/18/15 2149 .  sodium chloride flush (NS) 0.9 % injection 3 mL, 3 mL, Intravenous, Q12H, Runell GessJonathan J Berry, MD, 3 mL at 09/18/15 2158 .  sodium chloride flush (NS) 0.9 % injection 3 mL, 3 mL, Intravenous, PRN, Runell GessJonathan J Berry, MD .  traMADol-acetaminophen (ULTRACET) 37.5-325 MG per tablet 1 tablet, 1 tablet, Oral, Q6H PRN, Ok Anishristopher R Berge, NP .  zolpidem (AMBIEN) tablet 5 mg, 5 mg, Oral, QHS, Ok Anishristopher R Berge, NP, 5 mg at 09/18/15 2151 . sodium chloride Stopped (09/18/15 1200)     TELE:   V paced, frequent PVC's.       Transthoracic Echocardiography 09/18/15 Study Conclusions  - Left ventricle: The cavity size was normal. There was mild focal   basal hypertrophy of the septum. Systolic function was severely   reduced. The estimated ejection fraction was in the range of 25%   to 30%. There is akinesis of the mid-apicalanteroseptal   myocardium. The study is not technically sufficient to allow   evaluation of LV diastolic function. - Aortic valve: There was trivial regurgitation. - Mitral  valve: There was moderate regurgitation. - Left atrium: The atrium was severely dilated. - Right ventricle: Pacer wire or catheter noted in right ventricle. - Tricuspid valve: There was moderate regurgitation.  Current Medications:  . amiodarone  400 mg Oral BID  . amLODipine  5 mg Oral QHS  . aspirin  81 mg Oral Daily  . atorvastatin  40 mg Oral q1800  . clopidogrel  75 mg Oral Q breakfast  . dorzolamide-timolol  1 drop Both Eyes BID  . fluticasone  2 spray Each Nare Daily  . latanoprost  1 drop Both Eyes QHS  . levETIRAcetam  250 mg Oral Daily   And  . levETIRAcetam  500 mg Oral QHS  . levothyroxine  25 mcg Oral QAC breakfast  . loratadine  10 mg Oral Daily  . metoprolol tartrate  25 mg Oral BID  . multivitamin with minerals  1 tablet Oral Daily  . pantoprazole  40 mg Oral Q0600  . senna-docusate  2 tablet Oral QHS  . sodium chloride flush  3 mL Intravenous Q12H  . zolpidem  5 mg Oral QHS   . sodium chloride Stopped (09/18/15 1200)    ASSESSMENT AND PLAN: Active Problems:   Acute ST elevation myocardial infarction (STEMI) due to occlusion of left coronary artery (HCC)   Acute inferolateral myocardial infarction (HCC)   Acute ST elevation myocardial infarction (STEMI) of lateral wall (HCC)   ST elevation myocardial infarction (STEMI) (HCC)  1. Acute anterolateral STEMI:  secondary to distal LAD occlusion, possible embolic event. Medical management with ASA, plavix. Oral anticoagulation  contraindicated. Tolerating beta-blocker - dose increased to 25mg  BID this admission.   Echo shows reduced EF of 25-30%, previous EF was 40% in 2016. No ACE-I or ARB due to CKD.   2. NSVT: improved on amiodarone. LV function reduced at 25-30%, MD to advise on continuing Amio. Still in ventricular bigeminy, can increase metoprolol to 75mg  BID.   3. Hematemesis: appreciate GI evaluation. Observation planned  4. Delirium: improved. Mental status much better today.   Signed, Little IshikawaErin E Smith , NP 9:46 AM 09/19/2015 Pager [phone]  Patient seen, examined. Available data reviewed. Agree with findings, assessment, and plan as outlined by Suzzette RighterErin Smith, NP. The patient is elderly. He is resting in a chair. His granddaughter is at the bedside. JVP says large V waves. Lungs are clear. Heart is regular rate and rhythm with a grade 2/6 systolic murmur at the left sternal border. Abdomen is soft and nontender. Extremities have no edema. The patient has had a large infarct with LVEF 25-30%. He's tolerating aspirin and Plavix without further bleeding. We'll continue amiodarone for nonsustained VT. Because of severe LV dysfunction, will discontinue amlodipine. Will change his beta blocker carvedilol. I do not think we should start an ACE/ARB in this frail elderly gentleman who has a creatinine now of approximately 1.7 mg/dL and is at high risk for acute kidney injury.  Tonny BollmanMichael Ketsia Linebaugh, M.D. 09/19/2015 11:41 AM

## 2015-09-20 NOTE — Consult Note (Signed)
   [facility] CM Inpatient Consult   09/20/2015  PURVIS SIDLE [birth year] [id number]  Patient was assessed for [facility] for community services. Patient was previously active with [facility].  Met with patient and granddaughter/caregiver Lynann Bologna,  at bedside regarding being restarted with [facility] services. Consent form signed and already on file and brochure with [facility] contact information given.   Chart review reveals the patient is an 82 year old male with a past medical history of chronic systolic HF (EF 66% in Sept. 2016), CHB s/p PPM, carotid disease s/p R CEA, HTN, CVA, and permanent Afib who presented with acute inferolateral STEMI per MD notes.    Of note, [facility] services does not replace or interfere with any services that are arranged by inpatient case management or social work. For additional questions or referrals please contact:  Natividad Brood, RN BSN [facility] Liaison  [phone] business mobile phone Toll free office [phone]

## 2015-09-20 NOTE — Progress Notes (Addendum)
NCM received call from dtr, Valora PiccoloMikosha Gatson #[phone]. States [facility]. [facility] and [facility] has been processed. Made DME rep aware that dtr had questions about mattress with his back pain. [facility] DME will follow pt with dtr to discuss bed type and mattress. Isidoro DonningAlesia Shavis RN CCM Case Mgmt phone [phone]  1725 Spoke to dtr, Valora PiccoloMikosha Gatson she is requesting gel overlay mattress. [facility], [facility] aware. Isidoro DonningAlesia Shavis RN CCM Case Mgmt phone [phone]  8/18 1033- NCM spoke with Ervin KnackMikosha, she states they will need a manuel w/chair and a rollator with [facility] , Jermaine states  she will  Have to pay for the rollator , she states that will be fine.  Vaughan BastaJermaine will bring up to room.  8/18 12:52- NCM received vm from [facility], stating that the MD said they would be set up for Palliative services at home, but it was canceled and she would like to be set up with palliative for patient,  NCM paged resident, she states to call Dr. Excell Seltzerooper but she does not think patient need palliative services at home, NCM calling Dr. Excell Seltzerooper to find out about palliative services at home. Did not receive call back.  NCM called Grand daughter Ervin KnackMikosha and informed her that NCM does not have referral for Palliative services and that the MD will have to notify NCM for this if this is what he wants for patient.  Ervin KnackMikosha states [facility] and try to look at palliative as outpatient.  Ervin KnackMikosha is to call NCM when DME is delivered to home so [facility] ambulance can transport patient home today.  NCM received call from Miosha stating DME is at home at 14:31  And to go ahead and call  [facility], address has been verified.  [facility] was called for transport and RN informed.

## 2015-09-20 NOTE — Discharge Summary (Signed)
Name: Oscar Santiago MRN: [id number] DOB: 07/05/1925 82 y.o. PCP: Kristian CoveyBruce W Burchette, MD  Date of Admission: 09/15/2015  3:55 PM Date of Discharge: 09/21/2015 Attending Physician: Runell GessJonathan J Berry, MD  Discharge Diagnosis: 1. STEMI ( Inferiolateral )  2. Hematemesis  Discharge Medications:   Medication List    STOP taking these medications   amLODipine 10 MG tablet Commonly known as:  NORVASC   mebendazole 100 MG chewable tablet Commonly known as:  VERMOX   valsartan 320 MG tablet Commonly known as:  DIOVAN     TAKE these medications   acetaminophen 500 MG tablet Commonly known as:  TYLENOL Take 1 tablet (500 mg total) by mouth every 8 (eight) hours as needed (pain).   aspirin 81 MG tablet Take 81 mg by mouth daily.   atorvastatin 40 MG tablet Commonly known as:  LIPITOR Take 1 tablet (40 mg total) by mouth daily at 6 PM.   carvedilol 3.125 MG tablet Commonly known as:  COREG Take 1 tablet (3.125 mg total) by mouth 2 (two) times daily with a meal.   cetirizine 10 MG tablet Commonly known as:  ZYRTEC Take 10 mg by mouth daily.   clopidogrel 75 MG tablet Commonly known as:  PLAVIX Take 1 tablet (75 mg total) by mouth daily with breakfast.   colchicine 0.6 MG tablet Take 0.6 mg by mouth 2 (two) times daily as needed (gout).   dextromethorphan-guaiFENesin 30-600 MG 12hr tablet Commonly known as:  MUCINEX DM Take 1 tablet by mouth 2 (two) times daily as needed for cough.   dorzolamide-timolol 22.3-6.8 MG/ML ophthalmic solution Commonly known as:  COSOPT Place 1 drop into both eyes 2 (two) times daily.   dorzolamide-timolol 22.3-6.8 MG/ML ophthalmic solution Commonly known as:  COSOPT Place 1 drop into both eyes 2 (two) times daily.   famotidine 10 MG chewable tablet Commonly known as:  PEPCID AC Chew 10 mg by mouth daily as needed for heartburn.   finasteride 5 MG tablet Commonly known as:  PROSCAR TAKE 1 TABLET (5 MG TOTAL) BY MOUTH DAILY.     fluticasone 50 MCG/ACT nasal spray Commonly known as:  FLONASE Place 2 sprays into both nostrils daily.   furosemide 20 MG tablet Commonly known as:  LASIX Take 1 tablet (20 mg total) by mouth daily.   hyoscyamine 0.125 MG SL tablet Commonly known as:  LEVSIN/SL Place 1 tablet (0.125 mg total) under the tongue every 4 (four) hours as needed for cramping.   latanoprost 0.005 % ophthalmic solution Commonly known as:  XALATAN Place 1 drop into both eyes at bedtime.   levETIRAcetam 500 MG tablet Commonly known as:  KEPPRA Take 1 tablet (500 mg total) by mouth 2 (two) times daily. Take 1/2 tablet (250 mg) by mouth every morning and 1 tablet (500 mg) every night   levothyroxine 25 MCG tablet Commonly known as:  SYNTHROID, LEVOTHROID TAKE ONE TABLET BY MOUTH ONCE DAILY BEFORE BREAKFAST   multivitamin with minerals Tabs tablet Take 1 tablet by mouth daily.   nitroGLYCERIN 0.4 MG SL tablet Commonly known as:  NITROSTAT Place 1 tablet (0.4 mg total) under the tongue every 5 (five) minutes x 3 doses as needed for chest pain.   pantoprazole 40 MG tablet Commonly known as:  PROTONIX Take 1 tablet (40 mg total) by mouth daily at 6 (six) AM.   polyethylene glycol packet Commonly known as:  MIRALAX / GLYCOLAX Take 17 g by mouth at bedtime.   senna-docusate 8.6-50  MG tablet Commonly known as:  Senokot-S Take 2 tablets by mouth at bedtime.   tamsulosin 0.4 MG Caps capsule Commonly known as:  FLOMAX Take 1 capsule by mouth once daily   traMADol-acetaminophen 37.5-325 MG tablet Commonly known as:  ULTRACET Take 1 tablet by mouth every 6 (six) hours as needed for severe pain.   vitamin B-12 1000 MCG tablet Commonly known as:  CYANOCOBALAMIN Take 1,000 mcg by mouth daily.   zolpidem 5 MG tablet Commonly known as:  AMBIEN Take 5 mg by mouth at bedtime.       Disposition and follow-up:   Oscar Santiago was discharged from [facility] in Stable condition.   At the hospital follow up visit please address:  1.  His medicine.  2.  Labs / imaging needed at time of follow-up: None  3.  Pending labs/ test needing follow-up: None  Follow-up Appointments: Follow-up Information    [facility] .   Why:  Resgistered Nurse, Physical Therapy, Aide Contact information: [address] [place] [address] [phone]        Inc. - Dme [facility] .   Why:  Hospital Bed, manuel w/chair Contact information: [address] [place] [address] [phone]           Hospital Course by problem list: 82 year old male with a past medical history of chronic systolic CHF (EF 57% in Sept. 8469), CHB s/p PPM, carotid disease s/p R CEA, HTN, HLD, CVA, and permanent Afib not on OAC 2/2 h/o GIB who presented with acute inferolateral STEMI.   1. Inferolateral STEMI: He was taken for urgent cath.which  showed occluded distal LAD felt to possibly be due to cardioembolic event from afib.  Dr. Allyson Sabal attempted unsuccessful PCI of distal LAD. Advised medical management.  Post cath patient became nauseated and vomited 200cc of coffee ground emesis. NG tube inserted but he didn't had any more vomitus.He remained hemodynamically stable.He has a history of GI bleed in past on coumadin and has not been on anticoagulation and has chronic afib.He was started on Plavix and ASA,and GI agreed with that. He had an episode of polymorphic ventricular tachycardia on 08/14 am,which was  self terminated.He was started on Amiodarone which he tolerated well, It was stopped at discharge considering his age and risk/benefits. Echo on 09/18/15 shows reduced EF of 25-30%, previous EF was 40% in 2016. No ACE-I or ARB due to CKD.  Because of severe LV dysfunction,  amlodipine was discontinued, and we  change his beta blocker to carvedilol. He remains stable and continue to improve during his rest of the hospital course. He was discharge home with  home health and 24 hrs. Help by his grand daughter.   Discharge Vitals:   BP 114/69   Pulse (!) 52   Temp 98.4 F (36.9 C) (Oral)   Resp 16   Ht 5' 9.5" (1.765 m)   Wt 181 lb 8 oz (82.3 kg)   SpO2 96%   BMI 26.42 kg/m   Pertinent Labs, Studies, and Procedures:  CMP Latest Ref Rng & Units 09/18/2015 09/17/2015 09/16/2015  Glucose 65 - 99 mg/dL 629(B) 284(X) 324(M)  BUN 6 - 20 mg/dL 01(U) 27(O) 20  Creatinine 0.61 - 1.24 mg/dL 5.36(U) 4.40(H) 4.74(Q)  Sodium 135 - 145 mmol/L 134(L) 137 135  Potassium 3.5 - 5.1 mmol/L 4.2 4.5 4.6  Chloride 101 - 111 mmol/L 105 106 102  CO2 22 - 32  mmol/L 21(L) 23 20(L)  Calcium 8.9 - 10.3 mg/dL 1.6(X8.5(L) 9.1 9.0  Total Protein 6.5 - 8.1 g/dL - 7.2 -  Total Bilirubin 0.3 - 1.2 mg/dL - 0.9 -  Alkaline Phos 38 - 126 U/L - 69 -  AST 15 - 41 U/L - 173(H) -  ALT 17 - 63 U/L - 57 -   CBC Latest Ref Rng & Units 09/18/2015 09/17/2015 09/16/2015  WBC 4.0 - 10.5 K/uL 9.8 9.5 4.7  Hemoglobin 13.0 - 17.0 g/dL 10.2(L) 11.1(L) 10.6(L)  Hematocrit 39.0 - 52.0 % 31.5(L) 34.8(L) 33.3(L)  Platelets 150 - 400 K/uL 117(L) 135(L) 137(L)   DG chest: FINDINGS: Borderline cardiomegaly. Bilateral small pleural effusion with bilateral basilar atelectasis or infiltrate. No pulmonary edema. Single lead cardiac pacemaker is unchanged in position.  IMPRESSION: Small bilateral pleural effusion with bilateral basilar atelectasis or infiltrate. No pulmonary edema. Single lead cardiac pacemaker lead unchanged in position.  Coronary Balloon Angioplasty  Coronary/Graft Angiography  Conclusion     Ost LM to LM lesion, 50 %stenosed.  Dist LAD lesion, 100 %stenosed.    IMPRESSION: Unsuccessful apical LAD PCI and aspiration thrombectomy in the setting of what appears to be a thromboembolic event probably related to his chronic A. fib not on oral coagulation. His EKG was remarkable for inferior apical ST segment elevation. I did not cross the aortic valve. Echo will be  obtained. Medical therapy will be recommended. The guidewire and catheter were removed. The sheath was secured. Intermedics was turned off. The patient left the lab in stable condition.  Nanetta BattyBerry, Jonathan. MD, [facility] 09/15/2015 5:44 PM  ECHO: Study Conclusions  - Left ventricle: The cavity size was normal. There was mild focal   basal hypertrophy of the septum. Systolic function was severely   reduced. The estimated ejection fraction was in the range of 25%   to 30%. There is akinesis of the mid-apicalanteroseptal   myocardium. The study is not technically sufficient to allow   evaluation of LV diastolic function. - Aortic valve: There was trivial regurgitation. - Mitral valve: There was moderate regurgitation. - Left atrium: The atrium was severely dilated. - Right ventricle: Pacer wire or catheter noted in right ventricle. - Tricuspid valve: There was moderate regurgitation.   Discharge Instructions: Discharge Instructions    AMB Referral to Cardiac Rehabilitation - Phase II    Complete by:  As directed   Diagnosis:  STEMI   AMB [facility]    Complete by:  As directed   Reason for consult:  Post hospital follow up; new medications   Diagnoses of:  Heart Failure   Expected date of contact:  1-3 days (reserved for hospital discharges)   Please assign to community nurse for transition of care calls and assess for home visits. RNCM please assess for pharmacist for new medication needs.  Questions please call:   Charlesetta ShanksVictoria Brewer, RN BSN CCM [facility] Liaison  [phone] business mobile phone Toll free office [phone]   Diet - low sodium heart healthy    Complete by:  As directed   Discharge instructions    Complete by:  As directed   It was pleasure taking care of you. Please FU with  Dr. Katrinka BlazingSmith and some one will call you with your appointment. Resume your activity slowly and as tolerated. We are stopping your Amiodarone, please do not take  any more.   Increase activity slowly    Complete by:  As directed  Signed: Arnetha Courser, MD 09/21/2015, 11:05 AM   Pager: [phone]

## 2015-09-20 NOTE — Progress Notes (Signed)
    Subjective:  Patient had a good night. No chest pain or shortness of breath this morning. He is sitting up in a chair resting.  Objective:  Vital Signs in the last 24 hours: Temp:  [98 F (36.7 C)-99.3 F (37.4 C)] 99.3 F (37.4 C) (08/17 0851) Pulse Rate:  [40-75] 75 (08/17 0851) Resp:  [14-15] 14 (08/17 0411) BP: (102-126)/(44-70) 115/54 (08/17 0851) SpO2:  [97 %-100 %] 97 % (08/17 0851) Weight:  [82.6 kg (182 lb)] 82.6 kg (182 lb) (08/17 0411)  Intake/Output from previous day: 08/16 0701 - 08/17 0700 In: 720 [P.O.:720] Out: 675 [Urine:675]  Physical Exam: Pt is alert and oriented, NAD HEENT: normal Neck: JVP - normal Lungs: CTA bilaterally CV: RRR with frequent early beats/PVCs Abd: soft, NT, Positive BS, no hepatomegaly Ext: no C/C/E, distal pulses intact and equal Skin: warm/dry no rash   Lab Results:  Recent Labs  09/18/15 0312  WBC 9.8  HGB 10.2*  PLT 117*    Recent Labs  09/18/15 0312  NA 134*  K 4.2  CL 105  CO2 21*  GLUCOSE 112*  BUN 27*  CREATININE 1.68*   No results for input(s): TROPONINI in the last 72 hours.  Invalid input(s): CK, MB  Tele: ventricular pacing and ventricular bigeminy  Assessment/Plan:  1. Acute anterolateral STEMI: Continue current medical management with aspirin and Plavix. Oral anticoagulation contraindicated even though this of that was likely embolic. Continue beta-blockade. Patient appears to be hemodynamically stable despite his severely reduced LV function.  2. Nonsustained ventricular tachycardia: Tolerating oral amiodarone. Anticipate continuing this for 4-6 weeks.  3. Hematemesis: Appreciate GI evaluation.  Disposition: Anticipate discharge home tomorrow with home health   Tonny BollmanCooper, Rosalva Neary, M.D. 09/20/2015, 12:33 PM

## 2015-09-20 NOTE — Progress Notes (Signed)
CARDIAC REHAB PHASE I   PRE:  Rate/Rhythm: 77 paced with bigeminy PVCs  BP:  Supine:   Sitting: 124/41  Standing:    SaO2: 97%RA  MODE:  Ambulation: 20 ft   POST:  Rate/Rhythm: 106 paced trigeminy PVCs  BP:  Supine:   Sitting: 133/54  Standing:    SaO2: 100%RA 1400-1418 Pt still sleepy but willing to walk. Walked 20 ft outside of room with rolling walker, gait belt use, asst x 2 with slow steady gait. Tired easily. Back to recliner after walk. Stated he would try to go farther tomorrow.   Luetta Nuttingharlene Tiersa Dayley, RN BSN  09/20/2015 2:14 PM

## 2015-09-20 NOTE — Plan of Care (Signed)
Problem: Safety: Goal: Ability to remain free from injury will improve Outcome: Progressing Patient uses his call light appropriately and waits for someone to come in before getting OOB. Patient and family instructed to use phone if needed and where the numbers were for his RN/NT, will continue to monitor but patient and family verbalized understanding.

## 2015-09-20 NOTE — Progress Notes (Signed)
Updated report received via Jessica RN using SBAR format, reviewed new orders, VS and events of the day, assumed care of patient. 

## 2015-09-20 NOTE — Progress Notes (Signed)
Report received in patient's room via Shanda BumpsJessica RN using SBAR format, reviewed orders, VS, meds, PMD, tests and general condition of patient, assumed care of patient.

## 2015-09-20 NOTE — Care Management Important Message (Signed)
Important Message  Patient Details  Name: Oscar Santiago N Wilbourne MRN: [id number] Date of Birth: 09/03/1925   Medicare Important Message Given:  Yes    Concepcion Kirkpatrick Abena 09/20/2015, 11:46 AM

## 2015-09-21 ENCOUNTER — Other Ambulatory Visit: Payer: Self-pay | Admitting: Physician Assistant

## 2015-09-21 DIAGNOSIS — I25119 Atherosclerotic heart disease of native coronary artery with unspecified angina pectoris: Secondary | ICD-10-CM

## 2015-09-21 DIAGNOSIS — R5381 Other malaise: Secondary | ICD-10-CM

## 2015-09-21 MED ORDER — DM-GUAIFENESIN ER 30-600 MG PO TB12: 1.0000 | ORAL_TABLET | Freq: Two times a day (BID) | ORAL | 0 refills | Status: DC | PRN

## 2015-09-21 MED ORDER — ATORVASTATIN CALCIUM 40 MG PO TABS: 40.0000 mg | ORAL_TABLET | Freq: Every day | ORAL | 3 refills | Status: DC

## 2015-09-21 MED ORDER — CLOPIDOGREL BISULFATE 75 MG PO TABS: 75.0000 mg | ORAL_TABLET | Freq: Every day | ORAL | 3 refills | Status: DC

## 2015-09-21 MED ORDER — PANTOPRAZOLE SODIUM 40 MG PO TBEC: 40.0000 mg | DELAYED_RELEASE_TABLET | Freq: Every day | ORAL | 0 refills | Status: DC

## 2015-09-21 MED ORDER — CARVEDILOL 3.125 MG PO TABS: 3.1250 mg | ORAL_TABLET | Freq: Two times a day (BID) | ORAL | 3 refills | Status: AC

## 2015-09-21 MED ORDER — NITROGLYCERIN 0.4 MG SL SUBL: 0.4000 mg | SUBLINGUAL_TABLET | SUBLINGUAL | 12 refills | Status: DC | PRN

## 2015-09-21 NOTE — Progress Notes (Signed)
   Subjective: Pt. Is feeling better, ready to go home. I explain to him that he should not be going to gym and start his exercise regime that early.  Objective:  Vital signs in last 24 hours: Vitals:   09/20/15 1602 09/20/15 2116 09/21/15 0457 09/21/15 0809  BP: (!) 119/47 128/64 (!) 102/44 114/69  Pulse:  73 (!) 52   Resp:  18 16   Temp:  98.3 F (36.8 C) 98.4 F (36.9 C)   TempSrc:  Axillary Oral   SpO2:  99% 96%   Weight:   181 lb 8 oz (82.3 kg)   Height:       PHYSICAL EXAM General: Well developed, well nourished, male in no acute distress. Head: Normocephalic, atraumatic.                Neck: Supple without bruits, no JVD. Lungs:  Resp regular and unlabored, CTA. Heart: RRR, with PVC. Abdomen: Soft, non-tender, non-distended, BS + x 4.  Extremities: No clubbing, cyanosis, no edema.  Neuro: Alert and oriented X 3. Moves all extremities spontaneously. Psych: Normal affect.   Assessment/Plan:  1:Acute anterolateral STEMI: Continue current medical management with aspirin and Plavix. Oral anticoagulation contraindicated due to GI bleed,even though this of that was likely embolic. Continue beta-blockade. Patient appears to be hemodynamically stable despite his severely reduced LV function.  2. Nonsustained ventricular tachycardia: Tolerating oral amiodarone. Considering his age and risk verses benefit decided to stop Amiodarone.    Dispo: Anticipated discharge today.  Arnetha CourserSumayya Amin, MD 09/21/2015, 9:23 AM Pager: [phone]  Patient seen, examined. Available data reviewed. Agree with findings, assessment, and plan as outlined by Dr Nelson ChimesAmin. The patient's family is at the bedside. He is sitting up in a chair again today. He feels well. Exam reveals an elderly male in no distress. Lungs are clear. Heart is regular rate and rhythm. Abdomen is soft and nontender. There is no pretibial edema. His medical program is reviewed. I think we should stop amiodarone. He is now through the  immediate post MI period and appears to be stable. We'll continue him on a low-dose of carvedilol. He remains on aspirin and Plavix. Will arrange close follow-up with Dr. Katrinka BlazingSmith. He is ready for discharge today.  Tonny BollmanMichael Fard Borunda, M.D. 09/21/2015 12:15 PM

## 2015-09-21 NOTE — Progress Notes (Signed)
[id number] Gave family and care giver MI booklet, CHF booklet and low sodium diets. Encouraged pt to walk as he feels able and to do overdo. To listen to his body. He is going to get Home PT which he has had. Pt is not appropriate for Phase 2 cardiac rehab. Care givers very receptive to ed and have been watching his sodium prior to admission. Will start daily weights. Luetta NuttingCharlene Mikhaela Zaugg RN BSN 09/21/2015 10:10 AM

## 2015-09-24 ENCOUNTER — Other Ambulatory Visit: Payer: Self-pay | Admitting: *Deleted

## 2015-09-24 ENCOUNTER — Encounter: Payer: Self-pay | Admitting: *Deleted

## 2015-09-24 ENCOUNTER — Telehealth: Payer: Self-pay | Admitting: Interventional Cardiology

## 2015-09-24 ENCOUNTER — Telehealth: Payer: Self-pay | Admitting: Neurology

## 2015-09-24 NOTE — Telephone Encounter (Signed)
Oscar RomansFYI-Granddaughter [name] [phone] called to cancel August 24th appointment with Dr. Roda ShuttersXu, patient had massive heart attack August 12th, "they were unable to repair his heart", patient discharged to home on Friday August 18th for comfort care.

## 2015-09-24 NOTE — Telephone Encounter (Signed)
Returned call to pt grand daughter Junious DresserConnie. lmom will work on getting Hospice care initiated when I am back in the office on 8/23

## 2015-09-24 NOTE — Telephone Encounter (Signed)
Junious DresserConnie is calling because Dr. Katrinka BlazingSmith told her to call and have a referral set up for hospice. Please call   Thanks

## 2015-09-24 NOTE — Telephone Encounter (Signed)
Message sent to Dr. Roda ShuttersXu. Just FYI thanks.

## 2015-09-24 NOTE — Patient Outreach (Signed)
[facility]) Care Management [facility] Community [facility] Telephone Outreach, Transition of Care day 1 09/24/2015  Oscar OchoaHaywood N Santiago 05/19/1925 [id number]  Successful telephone outreach to Oscar Santiago, granddaughter and caregiver of Oscar AmmonsHaywood Imbert, 82 y/o male followed by Oscar Santiago for transition of care after recent hospitalization August 12-18, 2017 for STEMI and hematemesis; patient had [facility] and consent was verified through review of EMR.  Patient noted to have history of CHF, A-Fib, HTN/ TIA, pacemaker, hyperlipemia, seizure disorder, CKD, GI bleeding on anticoagulation, and prior MI.  Oscar Santiago was [facility] home [facility].  Verbal consent obtained/ HIPAA verified today by patient's granddaughter/ caregiver Oscar Santiago.  Today, Oscar Santiago reports that Oscar Santiago has been "struggling," after his recent hospitalization, and verbalizes that he is "sleeping most of the day."  Oscar Santiago reports that during his hospitalization, hospice/ palliative care conversations occurred, and are patient's overall goal of his current state of health now that he is back at home.  Oscar Santiago reports that patient is eating and drinking "very little," and that he is only able to be transferred from his hospital bed to his chair for short periods of time with the use of a transfer belt.  Oscar Santiago states that she is actively working on obtaining home hospice home care for patient.  Oscar Santiago confirms today that she has hired a Product/process development scientistprivate duty sitter to stay with Oscar Santiago from 11 am- 5 pm while she works, but also reports that due to patient's current end-of-life issues, she is considering taking FMLA from her job.  Medications:  Oscar Santiago reports that she prepares and administers patient's medications, and that Oscar Santiago has all of his medications and is currently able to take them without difficulty.  Medications were briefly reviewed with  Oscar Dresseronnie today, as she was unable to perform a formal medication reconciliation due to time constraints.  Home health services:  Oscar Santiago stated that DME for hospital bed/ wheelchair have been delivered, and that [facility]/ PT/ aide.  Oscar Santiago confirmed that [facility],  Upcoming provider appointments:  Oscar Santiago reports that patient is not able to attend provider appointments with his current state of health.  Oscar Santiago stated that patient's providers are aware of his current condition and limitations, and that she has maintained contact with his providers for care needs through telephone communication.  Oscar Santiago stated that patient has had "a gout flare," since he has been home from the hospital, and that she is currently waiting to hear back from the doctor, whom she has contacted, with instructions.  Oscar Santiago stated that prior to the most recent hospitalization she had been monitoring and recording his BP's and weights daily, but is no longer doing, given his current state of health.  Oscar Santiago reported that she is in the process of obtaining a large piece of tile to place near his lift chair so they could "hopefully" resume daily weights when they get him out of bed each day, and explains that there is carpet in the area which prevents obtaining an accurate weight.    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  We discussed [facility] Community Santiago services today, which Oscar Santiago is very familiar with, as [facility].  I provided/ confirmed Oscar Santiago the phone numbers for the [facility] line, the  main number to [facility], and my direct phone number.  I encouraged Oscar Santiago to contact patient's providers promptly for any new concerns or needs that arise, and she agreed to do so.  Oscar Santiago denies further questions, concerns, needs, or problems at this time, and we scheduled initial [facility] in-home visit for next week.  Plan:  Oscar Santiago will continue to administer Oscar Santiago's medications as they are prescribed.  Oscar Santiago will continue to maintain regular communication with Oscar Santiago's providers, and contact them promptly for any new concerns, issues, or problems.  Oscar Santiago/ Oscar Santiago will continue to work with [facility] as they are ordered.  Oscar Santiago will update this [facility] RN Santiago on her progress with arranging hospice/ palliative care during next [facility] outreach.  Continued [facility] Community Santiago outreach for transition of care scheduled for next week with initial in-home visit   Caryl PinaLaine Mckinney Evalyn Shultis, RN, BSN, [employer] INCCCR[facility] Alumnus Community Care Coordinator [facility] Care Management  [phone]

## 2015-09-25 ENCOUNTER — Encounter: Payer: Self-pay | Admitting: *Deleted

## 2015-09-26 NOTE — Telephone Encounter (Signed)
Message forwarded to Dr.Smith to confirm  that patinet needs to  be referred  to hospice care.

## 2015-09-27 ENCOUNTER — Ambulatory Visit: Payer: Self-pay | Admitting: Neurology

## 2015-09-27 NOTE — Telephone Encounter (Signed)
Roderic ScarceLmom. David [facility] @ [facility]. Verbal order given to continue PT.

## 2015-09-27 NOTE — Telephone Encounter (Signed)
New message       Calling to get verbal orders to continue PT 2 x a week for 5 weeks for strength, gait and endurance.

## 2015-09-30 NOTE — Telephone Encounter (Signed)
good

## 2015-10-01 ENCOUNTER — Telehealth: Payer: Self-pay | Admitting: Interventional Cardiology

## 2015-10-01 ENCOUNTER — Telehealth: Payer: Self-pay | Admitting: Cardiovascular Disease

## 2015-10-01 ENCOUNTER — Encounter: Payer: Self-pay | Admitting: *Deleted

## 2015-10-01 ENCOUNTER — Other Ambulatory Visit: Payer: Self-pay | Admitting: *Deleted

## 2015-10-01 ENCOUNTER — Ambulatory Visit: Payer: Medicare Other | Admitting: Family Medicine

## 2015-10-01 MED ORDER — ONDANSETRON HCL 4 MG PO TABS: 4.0000 mg | ORAL_TABLET | Freq: Three times a day (TID) | ORAL | 0 refills | Status: DC | PRN

## 2015-10-01 NOTE — Telephone Encounter (Signed)
Margaretha GlassingLoretta from [facility] called after speaking at length with [name]'s daughter over the phone.  He is having persistent nausea at home and this has limited his ability to eat.  She requested Zofran be called [facility].  I eprescribed Zofran 4mg  tabs Q8H PRN nausea with a total of 9 tabs.  The pt's daughter will call his primary cardiologist in the morning to update him.  Azalee CoursePaul S. Jefry Lesinski MD

## 2015-10-01 NOTE — Telephone Encounter (Signed)
New message        Calling to check the status on the hospice consult from last week.

## 2015-10-01 NOTE — Patient Outreach (Signed)
[facility]) Care Management  [place] Initial home visit, Transition of Care day 8 10/01/2015  Oscar Santiago Sep 14, 1925 [id number]  Oscar Santiago is an 82 y.o. male followed by [place] [facility] for transition of care after recent hospitalization August 12-18, 2017 for STEMI and hematemesis; patient had previously been followed by [facility] in 2016 and consent was verified through review of EMR.  Patient noted to have history of CHF, A-Fib, HTN/ TIA, pacemaker, hyperlipemia, seizure disorder, CKD, GI bleeding on anticoagulation, and prior MI.  Mr. Dicaprio was discharged home with home health [facility]) services through [facility].  Today, [place] in-home visit was made with patient's granddaughter/ caregiver/ POA Oscar Santiago, as well as his private duty care assistant, Oscar Santiago.  [place] written consent was obtained during today's visit.  Patient is a retired Company secretary.  I met with Oscar Santiago and Oscar Santiago before being taken to the patient's room.  Oscar Santiago reports today that Oscar Santiago is "holding his own," and states that he has improved somewhat from our last phone conversation, and that she has reconsidered calling in hospice for end-of-life planning, as Oscar Santiago became very upset with her when she approached the issue with him last week.  Oscar Santiago stated that she had and would continue to keep Oscar Santiago on his status by telephone, but stated that he is now unable to attend Santiago Santiago, stating that "he just can't get out anymore... It just wipes him out too badly."  Oscar Santiago reports that patient's biggest issue appears to be resting at night.  Oscar Santiago stated that she has been administering his medications for his gout pain and for sleep every night, which has been helping him rest through the night.  Oscar Santiago stated that she is currently on intermittent FMLA with her workplace, which has helped her navigate Oscar Santiago current state of health.  Oscar Santiago reports that home health [facility]) services have continued, and [facility] RN Oscar Santiago [phone]) was also present for part of today's [place] in-home visit.  Oscar Santiago reports that Oscar Santiago has slowly started eating more, and occasionally feels up to walking with assistance to the kitchen to have his meals; on other days when he is not feeling as well, Oscar Santiago reports that he stays in his room for meals that are prepared by his caregivers.  Oscar Santiago again denies need for community resources, stating that she and her family network are able to meet all of his needs.   Oscar Santiago reports that Oscar Santiago has all of his medications and takes them as they are prescribed, stating that she prepares and administers al of his medications.  Oscar Santiago is unable to perform a formal medication reconciliation today, as she has a prior commitment to keep outside of the home while Oscar Santiago is present with patient.  Oscar Santiago was sitting up in his bedside lift chair when I assessed him, and he is awake, A/O x 3, although he is slow to verbally respond to my questions.  Oscar Santiago denies pain, concerns, issues or needs today.  Subjective: "I am doing okay for the most part.... There have been a lot of changes in my condition lately, and I am very tired."  Objective:    BP 122/62   Pulse (!) 44   Resp 14   SpO2 97%    Review of Systems  Constitutional: Positive for malaise/fatigue. Negative for fever and weight loss.  Respiratory: Positive for shortness  of breath. Negative for cough, sputum production and wheezing.        Patient and family report slight/ occasional shortness of breath with activity  Cardiovascular: Negative for chest pain and leg swelling.  Gastrointestinal: Negative for abdominal pain, nausea and vomiting.  Genitourinary: Negative for dysuria.  Musculoskeletal: Positive for joint pain. Negative for falls and myalgias.       Family reports  patient has occasional pain from ongoing gout in feet, which is well controlled with medications; patient denies pain today.  Neurological: Positive for weakness. Negative for dizziness.  Psychiatric/Behavioral: Positive for depression. The patient is not nervous/anxious.        Patient reports that he is sometimes sad/ depressed, but states that he believes this is "normal," and denies need/ want for treatment of depression    Physical Exam  Constitutional: He is oriented to person, place, and time. He appears well-developed and well-nourished.  Cardiovascular: Regular rhythm, normal heart sounds and intact distal pulses.  Bradycardia present.   Pulses:      Radial pulses are 2+ on the right side, and 2+ on the left side.       Dorsalis pedis pulses are 2+ on the right side, and 2+ on the left side.  Respiratory: Effort normal and breath sounds normal. No respiratory distress. He has no wheezes. He has no rales.  GI: Soft. Bowel sounds are normal.  Musculoskeletal: He exhibits no edema.  Neurological: He is alert and oriented to person, place, and time.  Skin: Skin is warm and dry.  Psychiatric: He has a normal mood and affect. His behavior is normal. Judgment and thought content normal.    Encounter Medications:   Outpatient Encounter Prescriptions as of 10/01/2015  Medication Sig  . acetaminophen (TYLENOL) 500 MG tablet Take 1 tablet (500 mg total) by mouth every 8 (eight) hours as needed (pain).  Marland Kitchen aspirin 81 MG tablet Take 81 mg by mouth daily.  Marland Kitchen atorvastatin (LIPITOR) 40 MG tablet Take 1 tablet (40 mg total) by mouth daily at 6 PM.  . carvedilol (COREG) 3.125 MG tablet Take 1 tablet (3.125 mg total) by mouth 2 (two) times daily with a meal.  . cetirizine (ZYRTEC) 10 MG tablet Take 10 mg by mouth daily.  . clopidogrel (PLAVIX) 75 MG tablet Take 1 tablet (75 mg total) by mouth daily with breakfast.  . colchicine 0.6 MG tablet Take 0.6 mg by mouth 2 (two) times daily as needed (gout).   Marland Kitchen dextromethorphan-guaiFENesin (MUCINEX DM) 30-600 MG 12hr tablet Take 1 tablet by mouth 2 (two) times daily as needed for cough.  . dorzolamide-timolol (COSOPT) 22.3-6.8 MG/ML ophthalmic solution Place 1 drop into both eyes 2 (two) times daily.  . dorzolamide-timolol (COSOPT) 22.3-6.8 MG/ML ophthalmic solution Place 1 drop into both eyes 2 (two) times daily.  . famotidine (PEPCID AC) 10 MG chewable tablet Chew 10 mg by mouth daily as needed for heartburn.   . finasteride (PROSCAR) 5 MG tablet TAKE 1 TABLET (5 MG TOTAL) BY MOUTH DAILY.  . fluticasone (FLONASE) 50 MCG/ACT nasal spray Place 2 sprays into both nostrils daily.  . furosemide (LASIX) 20 MG tablet Take 1 tablet (20 mg total) by mouth daily.  . hyoscyamine (LEVSIN/SL) 0.125 MG SL tablet Place 1 tablet (0.125 mg total) under the tongue every 4 (four) hours as needed for cramping.  . latanoprost (XALATAN) 0.005 % ophthalmic solution Place 1 drop into both eyes at bedtime.   . levETIRAcetam (KEPPRA) 500 MG tablet Take  1 tablet (500 mg total) by mouth 2 (two) times daily. Take 1/2 tablet (250 mg) by mouth every morning and 1 tablet (500 mg) every night  . levothyroxine (SYNTHROID, LEVOTHROID) 25 MCG tablet TAKE ONE TABLET BY MOUTH ONCE DAILY BEFORE BREAKFAST  . Multiple Vitamin (MULTIVITAMIN WITH MINERALS) TABS tablet Take 1 tablet by mouth daily.  . nitroGLYCERIN (NITROSTAT) 0.4 MG SL tablet Place 1 tablet (0.4 mg total) under the tongue every 5 (five) minutes x 3 doses as needed for chest pain.  . pantoprazole (PROTONIX) 40 MG tablet Take 1 tablet (40 mg total) by mouth daily at 6 (six) AM.  . polyethylene glycol (MIRALAX / GLYCOLAX) packet Take 17 g by mouth at bedtime.  . senna-docusate (SENOKOT-S) 8.6-50 MG tablet Take 2 tablets by mouth at bedtime.  . tamsulosin (FLOMAX) 0.4 MG CAPS capsule Take 1 capsule by mouth once daily  . traMADol-acetaminophen (ULTRACET) 37.5-325 MG tablet Take 1 tablet by mouth every 6 (six) hours as needed for  severe pain.  . vitamin B-12 (CYANOCOBALAMIN) 1000 MCG tablet Take 1,000 mcg by mouth daily.  Marland Kitchen zolpidem (AMBIEN) 5 MG tablet Take 5 mg by mouth at bedtime.   No facility-administered encounter medications on file as of 10/01/2015.     Functional Status:   In your present state of health, do you have any difficulty performing the following activities: 10/01/2015 09/16/2015  Hearing? Tempie Donning  Vision? N Y  Difficulty concentrating or making decisions? Tempie Donning  Walking or climbing stairs? Y Y  Dressing or bathing? Y N  Doing errands, shopping? Tempie Donning  Preparing Food and eating ? Y -  Using the Toilet? N -  In the past six months, have you accidently leaked urine? N -  Do you have problems with loss of bowel control? N -  Managing your Medications? Y -  Managing your Finances? Y -  Housekeeping or managing your Housekeeping? Y -  Some recent data might be hidden    Fall/Depression Screening:    PHQ 2/9 Scores 10/01/2015 05/23/2015 11/28/2014 04/12/2014  PHQ - 2 Score 2 0 0 0  PHQ- 9 Score 4 - - -    Assessment:  Kaiyu Mirabal is doing well after his recent hospitalization, and has excellent support systems through his family and private duty caregiver.  Cope Marte is tired and acknowledges that his current state of health is progressively declining.  Reverend Buchanan's primary caregiver/ granddaughter Oscar Santiago, is exploring her options for ongoing patient care needs and is keeping in close communication with his medical providers for any needs that arise.  Plan:   Oscar Santiago will continue to administer Reverend Wilber's medications as they are prescribed.   Oscar Santiago will continue to maintain regular communication with [employer] providers, and contact them promptly for any new concerns, issues, or problems.   Connie/ Shakai Dolley will continue to work with [facility] services as they are ordered.   Oscar Santiago will update this [facility] RN CM on her interest/ need with arranging hospice/ palliative care with  ongoing [place] outreach.   Continued [facility] Community CM outreach for transition of care scheduled with  telephone outreach next week .   It is a pleasure participating in [employer] care,  Oneta Rack, RN, BSN, [employer] [facility] Care Management  [phone]

## 2015-10-01 NOTE — Telephone Encounter (Signed)
I attempted to contact Beth at [phone], [facility] answers and states that is not their number.

## 2015-10-02 ENCOUNTER — Telehealth: Payer: Self-pay | Admitting: *Deleted

## 2015-10-02 ENCOUNTER — Telehealth: Payer: Self-pay | Admitting: Interventional Cardiology

## 2015-10-02 MED ORDER — ONDANSETRON 4 MG PO TBDP: 4.0000 mg | ORAL_TABLET | Freq: Three times a day (TID) | ORAL | 1 refills | Status: DC | PRN

## 2015-10-02 NOTE — Telephone Encounter (Signed)
Had been on a phone call that was transferred to me from scheduler, Waynetta SandyBeth from [facility], regarding asking Dr. Katrinka BlazingSmith to sign to be attending hospice MD for this patient.--when I received this reply from Dr. Katrinka BlazingSmith.  Informed Beth with [facility] that Dr. Katrinka BlazingSmith would prefer this to go through PCP.  She is going to call Dr. Caryl NeverBurchette to ask him to be the attending hospice MD.  She will call back if Dr. Caryl NeverBurchette is unable to do so.

## 2015-10-02 NOTE — Telephone Encounter (Signed)
Beth nurse with [facility] [phone](ph#[phone]) called stating she has been seeing the pt for home care and he has been complaining of nausea for the past several days . Also patient's granddaughter stated the pts urine is dark and the pt is weaker now. Beth stated the pt has used Zofran before and the pt left a message with his cardiologist for this and it has not been called in and they questioned if Dr Caryl NeverBurchette could call this in?

## 2015-10-02 NOTE — Telephone Encounter (Signed)
I would prefer this go through PCP. If not possible, then, I will do it.

## 2015-10-02 NOTE — Telephone Encounter (Signed)
[facility](Connie[facility] requesting that I approve Hospice Care and also provide medication for nausea.  We will contact Hospice and authorize evaluation. I will try to electronically send a prescription for Zofran. I may not know how to do this so please f/u to make sure they get it.

## 2015-10-02 NOTE — Telephone Encounter (Signed)
New message    Hospice calling want to know if Dr. Katrinka BlazingSmith would be patient attending.   If MD agree when should they activate Hospice order.

## 2015-10-02 NOTE — Telephone Encounter (Signed)
OK 

## 2015-10-02 NOTE — Telephone Encounter (Signed)
I left a message for Oscar Santiago to call back.  Spoke with another person there who stated will Dr. Katrinka BlazingSmith agree to be the attending, and if so is it ok to activate hospice standing orders.   [facility] transition of care note from 8/28 indicates patient's daughter may be reconsidering calling hospice.

## 2015-10-02 NOTE — Telephone Encounter (Signed)
Oscar Santiago is trying to get pt transferred to hospice. Pt's cardiologist states he would prefer pt's pcp be the attending physician.  Beth wants to ensure this is ok

## 2015-10-02 NOTE — Telephone Encounter (Signed)
Please have Hospice consult for OP services.

## 2015-10-03 ENCOUNTER — Telehealth: Payer: Self-pay | Admitting: Interventional Cardiology

## 2015-10-03 ENCOUNTER — Other Ambulatory Visit: Payer: Self-pay | Admitting: *Deleted

## 2015-10-03 NOTE — Telephone Encounter (Signed)
[facility] is calling to See if Dr. Katrinka BlazingSmith will be the attending for Mr Oscar Santiago while under Hospice .  Please call

## 2015-10-03 NOTE — Telephone Encounter (Signed)
Returned call to [facility]. lmtcb

## 2015-10-03 NOTE — Telephone Encounter (Signed)
Returned call to [place] OT @ [facility]. Verbal order given to continue pt's OT

## 2015-10-03 NOTE — Telephone Encounter (Signed)
New Message  Diane pt Occupational Therapist call requesting to speak with RN about getting a verbal order for her to continue therapy with pt for 4 weeks twice weekly. Please call back to discuss

## 2015-10-03 NOTE — Telephone Encounter (Signed)
Spoke with Amy @ [facility]   Dr. Katrinka BlazingSmith agree to be the attending.  Amy sts that nothing further is needed at this time

## 2015-10-03 NOTE — Patient Outreach (Signed)
[facility] Customer service managerHealthCare Network [facility]N) Care Management [facility] Community CM Telephone Outreach, Transition of Care day 10  10/03/2015  Heloise OchoaHaywood N Gervin 02/21/1925 [id number]  Successful telephone outreach to Cleophus Moltonnie Gatson, caregiver and granddaughter of Heloise OchoaHaywood N Lienemann, 82 y.o. male [facility] Community CM for transition of care after recent hospitalization August 12-18, 2017 for STEMI and hematemesis;Patient noted to have history of CHF, A-Fib, HTN/ TIA, pacemaker, hyperlipemia, seizure disorder, CKD, GI bleeding on anticoagulation, and prior MI. Mr. Reola CalkinsGoode was discharged home with home health [facility](HH) services through [facility].  Patient is a retired Optician, dispensingminister.  Call today was placed in response to a voicemail I received from Darbyonnie, explaining that she needed to cancel an appointment she had thought we had made together for tomorrow; clarified with Junious DresserConnie that we had not made an appointment for an in-home visit, and that I had scheduled a follow up phone call for next week  to check on patient for continued transition of care.  Junious DresserConnie shared today that she had reached out to Hospice and that they would be coming to visit patient tomorrow; emotional support and encouragement was provided.  I confirmed with Junious DresserConnie that I would call next week as planned for an update, and she verbalized agreement.  Plan:  Junious DresserConnie will continue to administer Reverend Oriordan's medications as they are prescribed.  Junious DresserConnie will continue to maintain regular communication with Radio[employer]rend Quinto's providers, and contact them promptly for any new concerns, issues, or problems.  Connie/ Calton GoldsReverend Maeda will continue to work with [facility] services as they are ordered.  Junious DresserConnie will update this [facility] RN CM on her interest/ need with arranging hospice/ palliative care with ongoing [facility] Community CM outreach.  Continued [facility] Community CM outreach for transition of care scheduled with  telephone outreach next week .    Caryl PinaLaine Mckinney  Tousey, RN, BSN, [employer] Alumnus Community Care Coordinator [facility] Care Management  [phone]

## 2015-10-03 NOTE — Telephone Encounter (Signed)
Ok

## 2015-10-04 ENCOUNTER — Telehealth: Payer: Self-pay | Admitting: Interventional Cardiology

## 2015-10-04 NOTE — Telephone Encounter (Signed)
I called Beth and left a detailed message with the verbal order per the message below.

## 2015-10-04 NOTE — Telephone Encounter (Signed)
Hospice requesting as needed Oxygen order for 2 liters a minute, pt getting really SOB at times. Verbal order ok, pls call [phone] today or [phone] tommorow

## 2015-10-04 NOTE — Telephone Encounter (Signed)
[facility]. Ok per Dr.Smith to give the verbal order for the patient to have 2 liters of O2 prn. Hospice service aware

## 2015-10-09 ENCOUNTER — Other Ambulatory Visit: Payer: Self-pay | Admitting: *Deleted

## 2015-10-09 NOTE — Patient Outreach (Signed)
[facility] CM Telephone Outreach, Transition of Care day 16 10/09/2015  Oscar Santiago 11/29/1925 [id number]   Successful telephone outreach to Oscar Santiago, caregiver and granddaughter of Oscar Santiago, 82 y.o.malefollowed by [facility] CM for transition of care after recent hospitalization August 12-18, 2017 for STEMI and hematemesis;Patient noted to have history of CHF, A-Fib, HTN/ TIA, pacemaker, hyperlipemia, seizure disorder, CKD, GI bleeding on anticoagulation, and prior MI. [name] was discharged home with home health [facility] services through [facility]. Patient is a retired Optician, dispensingminister.  HIPAA verified through patient's granddaughter/ caregiver Oscar DresserConnie, on [facility] written consent.  Today, Oscar DresserConnie reports that "things are about the same" with [name].  Oscar DresserConnie reports no changes in patient condition and states that she has kept patient's providers "in the loop" with his condition.  Oscar DresserConnie again confirmed today that she has "signed on" with Hospice for his ongoing care, and she denies concerns, questions, issues or problems, stating "everything is under control."   Emotional support and encouragement was provided to [place].  I confirmed with Oscar DresserConnie that another [facility] Community RN CM would be calling to check in with [name] next week, and that I would place a call to her to check in on patient the following week.  I confirmed that Oscar DresserConnie had the [facility] 24-hour nurse phone line and the main number to [facility] CM.  Plan:  Oscar DresserConnie will continue to administer Oscar Santiago's medications as they are prescribed.  Oscar DresserConnie will continue to maintain regular communication withReverendGoode's providers, and contact them promptly for any new concerns, issues, or problems.  Oscar Santiago/Oscar Santiago will continue to work with [facility] as they are ordered.  Continued [facility] Community CM telephone outreach for transition of care next week.   Caryl PinaLaine  Mckinney Tousey, RN, BSN, [employer] Community Care Coordinator [facility] Care Management  [phone]) [phone]

## 2015-10-10 ENCOUNTER — Encounter: Payer: Self-pay | Admitting: Interventional Cardiology

## 2015-10-11 ENCOUNTER — Telehealth: Payer: Self-pay

## 2015-10-11 NOTE — Telephone Encounter (Signed)
Signed certification statement placed in MR nurse fax box to [facility]

## 2015-10-12 ENCOUNTER — Ambulatory Visit (INDEPENDENT_AMBULATORY_CARE_PROVIDER_SITE_OTHER): Admitting: *Deleted

## 2015-10-12 ENCOUNTER — Telehealth: Payer: Self-pay | Admitting: Cardiology

## 2015-10-12 DIAGNOSIS — I442 Atrioventricular block, complete: Secondary | ICD-10-CM

## 2015-10-12 NOTE — Telephone Encounter (Signed)
LMOVM reminding pt to send remote transmission.   

## 2015-10-15 ENCOUNTER — Telehealth: Payer: Self-pay | Admitting: Interventional Cardiology

## 2015-10-15 ENCOUNTER — Encounter: Payer: Self-pay | Admitting: Cardiology

## 2015-10-15 ENCOUNTER — Telehealth: Payer: Self-pay | Admitting: Family Medicine

## 2015-10-15 NOTE — Telephone Encounter (Signed)
I am the Physician who referred to Hospice. However,  I think the PCP or the Hospice physician should be in charge of these.

## 2015-10-15 NOTE — Telephone Encounter (Signed)
New message        *STAT* If patient is at the pharmacy, call can be transferred to refill team.   1. Which medications need to be refilled? (please list name of each medication and dose if known) pantoprazole 40mg  and mucinex 12hr 30-60mg  2. Which pharmacy/location (including street and city if local pharmacy) is [facility]  3. Do they need a 30 day or 90 day supply? 30day

## 2015-10-15 NOTE — Telephone Encounter (Signed)
Pt granddaughter Junious Dresserconnie is calling dr Verdis Primehenry smith fax over UA results per daughter grand dad [facility].

## 2015-10-15 NOTE — Telephone Encounter (Signed)
Should these be refilled by the patients pcp? Please advise. Thanks, MI

## 2015-10-15 NOTE — Progress Notes (Signed)
Remote pacemaker transmission.   

## 2015-10-15 NOTE — Telephone Encounter (Signed)
Renee from Hospice calling to request refill for pt colchicine 0.6 MG tablet levothyroxine (SYNTHROID, LEVOTHROID) 25 MCG tablet  [facility], [facility] [address]

## 2015-10-16 ENCOUNTER — Telehealth: Payer: Self-pay | Admitting: Family Medicine

## 2015-10-16 MED ORDER — CEPHALEXIN 500 MG PO CAPS
500.0000 mg | ORAL_CAPSULE | Freq: Three times a day (TID) | ORAL | 0 refills | Status: DC
Start: 2015-10-16 — End: 2015-10-26

## 2015-10-16 MED ORDER — DM-GUAIFENESIN ER 30-600 MG PO TB12: 1.0000 | ORAL_TABLET | Freq: Two times a day (BID) | ORAL | 5 refills | Status: DC | PRN

## 2015-10-16 MED ORDER — PANTOPRAZOLE SODIUM 40 MG PO TBEC: 40.0000 mg | DELAYED_RELEASE_TABLET | Freq: Every day | ORAL | 5 refills | Status: DC

## 2015-10-16 NOTE — Telephone Encounter (Signed)
Medication sent in for patient and granddaughter is aware.

## 2015-10-16 NOTE — Telephone Encounter (Signed)
Please see scanned UA result.

## 2015-10-16 NOTE — Telephone Encounter (Signed)
Medication refilled for patient. 

## 2015-10-16 NOTE — Telephone Encounter (Signed)
Refills OK for 6 months. 

## 2015-10-16 NOTE — Telephone Encounter (Signed)
Spoke with Luster Landsbergenee, hospice nurse and made her aware of recommendations from Dr Katrinka BlazingSmith. She stated that they are required to call Dr Katrinka BlazingSmith first as he is the attending of record. She informed me that medicare prevents hospice physicians from filling routine prescriptions for patients. She will contact the patients pcp to request that he refill these medications for the patient.

## 2015-10-16 NOTE — Telephone Encounter (Signed)
° °  Pt request refill of the following:    pantoprazole (PROTONIX) 40 MG tablet  dextromethorphan-guaiFENesin (MUCINEX DM)    The above meds was rx by the hospital and hosptice call to see if Dr Burchette will refill   Ph[facility]

## 2015-10-16 NOTE — Telephone Encounter (Signed)
In future, UA should be done along with urine cx  ( I realize this one was done per cardiology).  Start Keflex 500 mg po tid for 7 days and pt needs follow up if not better following that.

## 2015-10-16 NOTE — Telephone Encounter (Signed)
Looks like pt is scheduled for a wellness with Oscar Santiago on 11/02/2015. Okay to fill medications?

## 2015-10-17 ENCOUNTER — Other Ambulatory Visit: Payer: Self-pay | Admitting: *Deleted

## 2015-10-17 NOTE — Patient Outreach (Signed)
Transition of care call successful- this RN CM covering for coworker Su HiltLaine RN CM, follow up on recent hospitalization 8/12-8/18 STEMI and hematemesis.  Spoke with pt's granddaughter (on consent form, HCPOA), informed her this RN CM covering for Merrill LynchLaine primary RN CM. Granddaughter  reports pt has been confused, not sleeping at night (days and nights mixed up), been going on for 2 weeks.  Granddaughter reports Hospice services have started,HH discharged, hospice aware of the problem- tried to change pt to Ativan to which she tried one night and pt had nightmares.  Granddaughter reports pt is now back on Ambien, this is his second night giving it to him, still not sleeping,do not know what she is going to do.   Granddaughter reports waiting for aide to come so she can rest.  RN CM discussed with granddaughter view in Epic saw MD started pt on antibiotic to which granddaughter confirmed pt has UTI, taking antibiotic as ordered.     Plan:  As discussed with granddaughter,Laine (pt's primary RN CM) will be f/u with her next week.            RN CM to provide update to Merrill LynchLaine (pt's primary RN CM).     Shayne Alkenose M.   Pierzchala RN CCM [facility] Care Management  [phone]

## 2015-10-22 LAB — CUP PACEART REMOTE DEVICE CHECK
Lead Channel Impedance Value: 0 Ohm
Lead Channel Impedance Value: 394 Ohm
Lead Channel Pacing Threshold Amplitude: 0.75 V
Lead Channel Pacing Threshold Pulse Width: 0.4 ms
MDC IDC LEAD IMPLANT DT: 20080624
MDC IDC LEAD LOCATION: 753860
MDC IDC MSMT BATTERY IMPEDANCE: 159 Ohm
MDC IDC MSMT BATTERY REMAINING LONGEVITY: 104 mo
MDC IDC MSMT BATTERY VOLTAGE: 2.78 V
MDC IDC SESS DTM: 20170908201106
MDC IDC SET LEADCHNL RV PACING AMPLITUDE: 2.5 V
MDC IDC SET LEADCHNL RV PACING PULSEWIDTH: 0.4 ms
MDC IDC SET LEADCHNL RV SENSING SENSITIVITY: 4 mV
MDC IDC STAT BRADY RV PERCENT PACED: 65 %

## 2015-10-24 ENCOUNTER — Other Ambulatory Visit: Payer: Self-pay | Admitting: *Deleted

## 2015-10-24 ENCOUNTER — Encounter: Payer: Self-pay | Admitting: *Deleted

## 2015-10-24 NOTE — Patient Outreach (Signed)
[facility]) Care Management [place] Telephone Outreach, Transition of Care day 31 10/24/2015  Oscar Santiago [birth year] [id number]  Successful telephone outreach to Oscar Santiago, caregiver and granddaughter of Oscar Santiago, Oscar Santiago y.o.malefollowed by [place] for transition of care after recent hospitalization August 12-18, 2017 for STEMI and hematemesis;Patient noted to have history of CHF, A-Fib, HTN/ TIA, pacemaker, hyperlipemia, seizure disorder, CKD, GI bleeding on anticoagulation, and prior MI. Mr. Lavell was discharged home with home health [facility]) services through [facility] [place]. Patient is a retired Company secretary.  HIPAA verified through patient's granddaughter/ caregiver Oscar Santiago, on [facility] written consent.  Today, Oscar Santiago reports that "things are about the same" with Oscar Santiago.  Oscar Santiago again confirmed today that Hospice is involved in patient's care and she denies concerns, questions, issues or problems.  We discussed that patient has met his [facility] CM goal for transition of care, as he has not re-admitted to the hospital, and Oscar Santiago stated that she would like for me to call next week to check in with her about patient's overall plan of care, as she is unable to talk today for very long.   Emotional support and encouragement was provided to Oscar Santiago, and we agreed that I would place a call to her to check in on patient next week.  I confirmed that Oscar Santiago had the [facility] 24-hour nurse phone line and the main number to [facility] CM.  Plan:  Oscar Santiago will continue to administer Oscar Santiago's medications as they are prescribed.  Oscar Santiago will continue to maintain regular communication withReverendGoode's providers, and contact them promptly for any new concerns, issues, or problems.  Oscar Santiago will continue to work with [facility] services and Hospice as they are ordered.  Continued [facility] Community CM telephone outreach next week   Oneta Rack, RN, BSN,  [employer] Coordinator [facility] Care Management  [phone]

## 2015-10-25 ENCOUNTER — Encounter: Payer: Self-pay | Admitting: Interventional Cardiology

## 2015-10-26 ENCOUNTER — Encounter: Payer: Self-pay | Admitting: Interventional Cardiology

## 2015-10-26 ENCOUNTER — Ambulatory Visit (INDEPENDENT_AMBULATORY_CARE_PROVIDER_SITE_OTHER): Payer: Medicare Other | Admitting: Interventional Cardiology

## 2015-10-26 VITALS — BP 114/58 | HR 81 | Ht 70.0 in | Wt 168.6 lb

## 2015-10-26 DIAGNOSIS — Z950 Presence of cardiac pacemaker: Secondary | ICD-10-CM

## 2015-10-26 DIAGNOSIS — I442 Atrioventricular block, complete: Secondary | ICD-10-CM

## 2015-10-26 DIAGNOSIS — I482 Chronic atrial fibrillation, unspecified: Secondary | ICD-10-CM

## 2015-10-26 DIAGNOSIS — N183 Chronic kidney disease, stage 3 unspecified: Secondary | ICD-10-CM

## 2015-10-26 DIAGNOSIS — Z515 Encounter for palliative care: Secondary | ICD-10-CM

## 2015-10-26 DIAGNOSIS — I10 Essential (primary) hypertension: Secondary | ICD-10-CM | POA: Diagnosis not present

## 2015-10-26 DIAGNOSIS — I6523 Occlusion and stenosis of bilateral carotid arteries: Secondary | ICD-10-CM

## 2015-10-26 DIAGNOSIS — I5022 Chronic systolic (congestive) heart failure: Secondary | ICD-10-CM | POA: Diagnosis not present

## 2015-10-26 NOTE — Progress Notes (Signed)
Cardiology Office Note    Date:  10/26/2015   ID:  [name], DOB 1925-02-16, MRN [id number]  PCP:  Kristian Covey, MD  Cardiologist: Lesleigh Noe, MD   Chief Complaint  Patient presents with  . Coronary Artery Disease  . Congestive Heart Failure    History of Present Illness:  Oscar Santiago is a 82 y.o. male who presents for follow-up and is elderly and frail, now on hospice, recent infarction due to occlusion of the distal LAD, hypertension, permanent atrial fibrillation chronic systolic heart failure, history of right carotid endarterectomy, chronic kidney disease stage III, pacemaker for complete heart block, hypertension, and hyperlipidemia. Not a candidate for anticoagulation therapy because of GI bleeding.  He is now on hospice. His family brought him in because he requested to come in. He feels weak. He has difficulty swallowing. He will skip permission to attend church service. He has not sleeping well at night. He is in no pain. Intake is diminished by difficulty with swallowing.  Past Medical History:  Diagnosis Date  . Carotid arterial disease (HCC)    a. s/p R CEA with residual LICA 80% stenosis pending further eval.  . Chronic systolic dysfunction of left ventricle    a. EF 30-35% by echo 10/10/2009;  b. 10/2014 Echo: EF 40%, m ild LVH, mod MR, sev dil LA, mildly reduced RV fxn, mod TR, PASP .  . CKD (chronic kidney disease), stage III   . Complete heart block (HCC)    a. s/p MDT single chamber PPM - initially placed in 2008 w/ gen change in 2016.  Marland Kitchen Compression fracture of spine (HCC)    T 10 and T11  . Glaucoma   . Gout   . Hearing loss    bilateral -hearing aids  . Hemorrhoid   . History of GI bleed    from coumadin  . Hyperlipidemia   . Hypertension   . Hypothyroid   . Inguinal hernia   . Kidney stone   . Major depression (HCC)   . MR (mitral regurgitation)   . OSA (obstructive sleep apnea)    on CPAP therapy  . Osteopenia   .  Permanent atrial fibrillation (HCC)    a. refuses coumadin due to history of GI bleed - was prev Rx eliquis->never took.  . Seizure disorder (HCC)   . Stroke [facility])     Past Surgical History:  Procedure Laterality Date  . CARDIAC CATHETERIZATION  2002  . CARDIAC CATHETERIZATION N/A 09/15/2015   Procedure: Coronary Balloon Angioplasty;  Surgeon: Runell Gess, MD;  Location[facility] INVASIVE CV LAB;  Service: Cardiovascular;  Laterality: N/A;  . CARDIAC CATHETERIZATION N/A 09/15/2015   Procedure: Coronary/Graft Angiography;  Surgeon: Runell Gess, MD;  Location: [facility] INVASIVE CV LAB;  Service: Cardiovascular;  Laterality: N/A;  . COLONOSCOPY WITH PROPOFOL N/A 05/01/2014   Procedure: COLONOSCOPY WITH PROPOFOL;  Surgeon: [name], MD;  Location: [facility] ENDOSCOPY;  Service: Endoscopy;  Laterality: N/A;  . ENDARTERECTOMY Right 10/23/2014   Procedure: ENDARTERECTOMY CAROTID;  Surgeon: Larina Earthly, MD;  Location: [facility] OR;  Service: Vascular;  Laterality: Right;  . EP IMPLANTABLE DEVICE N/A 12/26/2014   Procedure: PPM Generator Changeout;  Surgeon: Marinus Maw, MD;  Location: [facility] INVASIVE CV LAB;  Service: Cardiovascular;  Laterality: N/A;  . HEMORRHOID SURGERY    . INGUINAL HERNIA REPAIR    . INSERTION OF SUPRAPUBIC CATHETER N/A 04/05/2015   Procedure: INSERTION OF SUPRAPUBIC CATHETER;  Surgeon: Jethro Bolus, MD;  Location: [facility] ORS;  Service: Urology;  Laterality: N/A;  . PACEMAKER INSERTION  07/28/06   VVI pacemaker for complete heart block by Dr Amil Amen  . TRANSURETHRAL RESECTION OF PROSTATE N/A 04/05/2015   Procedure: TRANSURETHRAL RESECTION OF THE PROSTATE (TURP);  Surgeon: Jethro Bolus, MD;  Location: [facility] ORS;  Service: Urology;  Laterality: N/A;    Current Medications: Outpatient Medications Prior to Visit  Medication Sig Dispense Refill  . acetaminophen (TYLENOL) 500 MG tablet Take 1 tablet (500 mg total) by mouth every 8 (eight) hours as needed (pain). 30 tablet 0  . aspirin 81  MG tablet Take 81 mg by mouth daily.    Marland Kitchen atorvastatin (LIPITOR) 40 MG tablet Take 1 tablet (40 mg total) by mouth daily at 6 PM. 30 tablet 3  . carvedilol (COREG) 3.125 MG tablet Take 1 tablet (3.125 mg total) by mouth 2 (two) times daily with a meal. 60 tablet 3  . cetirizine (ZYRTEC) 10 MG tablet Take 10 mg by mouth daily.    . clopidogrel (PLAVIX) 75 MG tablet Take 1 tablet (75 mg total) by mouth daily with breakfast. 30 tablet 3  . colchicine 0.6 MG tablet Take 0.6 mg by mouth 2 (two) times daily as needed (gout).    . dorzolamide-timolol (COSOPT) 22.3-6.8 MG/ML ophthalmic solution Place 1 drop into both eyes 2 (two) times daily.  6  . fluticasone (FLONASE) 50 MCG/ACT nasal spray Place 2 sprays into both nostrils daily. 16 g 0  . furosemide (LASIX) 20 MG tablet Take 1 tablet (20 mg total) by mouth daily. 30 tablet 11  . latanoprost (XALATAN) 0.005 % ophthalmic solution Place 1 drop into both eyes at bedtime.     Marland Kitchen levothyroxine (SYNTHROID, LEVOTHROID) 25 MCG tablet TAKE ONE TABLET BY MOUTH ONCE DAILY BEFORE BREAKFAST 90 tablet 1  . Multiple Vitamin (MULTIVITAMIN WITH MINERALS) TABS tablet Take 1 tablet by mouth daily.    . nitroGLYCERIN (NITROSTAT) 0.4 MG SL tablet Place 1 tablet (0.4 mg total) under the tongue every 5 (five) minutes x 3 doses as needed for chest pain. 30 tablet 12  . ondansetron (ZOFRAN) 4 MG tablet Take 1 tablet (4 mg total) by mouth every 8 (eight) hours as needed for nausea or vomiting. 9 tablet 0  . pantoprazole (PROTONIX) 40 MG tablet Take 1 tablet (40 mg total) by mouth daily at 6 (six) AM. 30 tablet 5  . tamsulosin (FLOMAX) 0.4 MG CAPS capsule Take 1 capsule by mouth once daily  5  . traMADol-acetaminophen (ULTRACET) 37.5-325 MG tablet Take 1 tablet by mouth every 6 (six) hours as needed for severe pain. 30 tablet 0  . vitamin B-12 (CYANOCOBALAMIN) 1000 MCG tablet Take 1,000 mcg by mouth daily.    . cephALEXin (KEFLEX) 500 MG capsule Take 1 capsule (500 mg total) by  mouth 3 (three) times daily. (Patient not taking: Reported on 10/26/2015) 21 capsule 0  . dextromethorphan-guaiFENesin (MUCINEX DM) 30-600 MG 12hr tablet Take 1 tablet by mouth 2 (two) times daily as needed for cough. (Patient not taking: Reported on 10/26/2015) 30 tablet 5  . dorzolamide-timolol (COSOPT) 22.3-6.8 MG/ML ophthalmic solution Place 1 drop into both eyes 2 (two) times daily. (Patient not taking: Reported on 10/26/2015) 10 mL 12  . famotidine (PEPCID AC) 10 MG chewable tablet Chew 10 mg by mouth daily as needed for heartburn.     . finasteride (PROSCAR) 5 MG tablet TAKE 1 TABLET (5 MG TOTAL) BY MOUTH DAILY.  5  . hyoscyamine (LEVSIN/SL) 0.125 MG SL tablet Place 1 tablet (0.125 mg total) under the tongue every 4 (four) hours as needed for cramping. (Patient not taking: Reported on 10/26/2015) 30 tablet 6  . levETIRAcetam (KEPPRA) 500 MG tablet Take 1 tablet (500 mg total) by mouth 2 (two) times daily. Take 1/2 tablet (250 mg) by mouth every morning and 1 tablet (500 mg) every night (Patient not taking: Reported on 10/26/2015) 45 tablet 1  . ondansetron (ZOFRAN-ODT) 4 MG disintegrating tablet Take 1 tablet (4 mg total) by mouth every 8 (eight) hours as needed for nausea or vomiting (Take only as needed). (Patient not taking: Reported on 10/26/2015) 20 tablet 1  . polyethylene glycol (MIRALAX / GLYCOLAX) packet Take 17 g by mouth at bedtime. (Patient not taking: Reported on 10/26/2015) 14 each 0  . senna-docusate (SENOKOT-S) 8.6-50 MG tablet Take 2 tablets by mouth at bedtime. (Patient not taking: Reported on 10/26/2015) 60 tablet 0  . zolpidem (AMBIEN) 5 MG tablet Take 5 mg by mouth at bedtime.     No facility-administered medications prior to visit.      Allergies:   Coumadin [warfarin sodium]; Warfarin; and Lisinopril   Social History   Social History  . Marital status: Widowed    Spouse name: N/A  . Number of children: 6  . Years of education: N/A   Occupational History  . Retired      Education officer, environmental   Social History Main Topics  . Smoking status: Never Smoker  . Smokeless tobacco: Never Used  . Alcohol use No  . Drug use: No  . Sexual activity: Not Asked   Other Topics Concern  . 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 Katrinka Blazing - Cardiology   Dr. Leone Payor - Gastroenterology   Dr. Brunilda Payor - Urology   [facility]     Family History:  The patient's family history includes Aneurysm in his brother; CVA in his brother and sister; Heart failure in his mother; Transient ischemic attack in his daughter.   ROS:   Please see the history of present illness.    Occasional hematuria, difficulty with balance, cough, and strangling. Also having significant difficulty with insomnia.  All other systems reviewed and are negative.   PHYSICAL EXAM:   VS:  BP (!) 114/58   Pulse 81   Ht 5\' 10"  (1.778 m)   Wt 168 lb 9.6 oz (76.5 kg)   BMI 24.19 kg/m    GEN: Well nourished, well developed, in no acute distress  HEENT: normal  Neck: no JVD, carotid bruits, or masses Cardiac: Irregularly RRR, right upper sternal systolic ejection murmur, rubs, or gallops,no edema  Respiratory:  clear to auscultation bilaterally, normal work of breathing GI: soft, nontender, nondistended, + BS MS: no deformity or atrophy  Skin: warm and dry, no rash Neuro:  Alert and Oriented x 3, Strength and sensation are intact Psych: euthymic mood, full affect  Wt Readings from Last 3 Encounters:  10/26/15 168 lb 9.6 oz (76.5 kg)  09/21/15 181 lb 8 oz (82.3 kg)  09/10/15 186 lb 14.4 oz (84.8 kg)      Studies/Labs Reviewed:   EKG:  EKG  Atrial fib with ventricular bigeminy with paced rhythm followed by PVC.  Recent Labs: 05/07/2015: B Natriuretic Peptide  261.9 09/17/2015: ALT 57 09/18/2015: BUN 27; Creatinine, Ser 1.68; Hemoglobin 10.2; Magnesium 2.1; Platelets 117;  Potassium 4.2; Sodium 134   Lipid Panel    Component Value Date/Time   CHOL 125 09/16/2015 0418   TRIG 36 09/16/2015 0418   HDL 41 09/16/2015 0418   CHOLHDL 3.0 09/16/2015 0418   VLDL 7 09/16/2015 0418   LDLCALC 77 09/16/2015 0418    Additional studies/ records that were reviewed today include:  Cardiac catheterization 09/2015:   Ost LM to LM lesion, 50 %stenosed.  Dist LAD lesion, 100 %stenosed.     Echocardiogram: August 2017. ------------------------------------------------------------------- Study Conclusions  - Left ventricle: The cavity size was normal. There was mild focal   basal hypertrophy of the septum. Systolic function was severely   reduced. The estimated ejection fraction was in the range of 25%   to 30%. There is akinesis of the mid-apicalanteroseptal   myocardium. The study is not technically sufficient to allow   evaluation of LV diastolic function. - Aortic valve: There was trivial regurgitation. - Mitral valve: There was moderate regurgitation. - Left atrium: The atrium was severely dilated. - Right ventricle: Pacer wire or catheter noted in right ventricle. - Tricuspid valve: There was moderate regurgitation.  ASSESSMENT:    1. Essential hypertension   2. Chronic systolic heart failure (HCC)   3. Chronic atrial fibrillation (HCC)   4. Hospice care   5. CHB (complete heart block) (HCC)   6. Cardiac pacemaker in situ   7. CKD (chronic kidney disease), stage III      PLAN:  In order of problems listed above:  1. Stable 2. No evidence of volume overload 3. Stable atrial fibrillation with bigeminy noted on EKG. No action required. 4. I spoke with Rev. Reola CalkinsGoode and his daughter Junious DresserConnie. They still have some misconceptions about hospice. We spent time discussing the role of hospice. They should utilize hospice in lieu of emergency room trips if Rev. Reola CalkinsGoode develops acute problems. They seem to now understand that our goal is comfort care at home. Rev.  Reola CalkinsGoode wanted to know if he'll be okay to go to church. I encouraged him to do so. 5. Pacer function is normal    Medication Adjustments/Labs and Tests Ordered: Current medicines are reviewed at length with the patient today.  Concerns regarding medicines are outlined above.  Medication changes, Labs and Tests ordered today are listed in the Patient Instructions below. Patient Instructions  Medication Instructions:  Your physician recommends that you continue on your current medications as directed. Please refer to the Current Medication list given to you today.  Labwork: No new orders.   Testing/Procedures: No new orders.   Follow-Up: Your physician recommends that you schedule a follow-up appointment as needed with Dr Katrinka BlazingSmith.    Any Other Special Instructions Will Be Listed Below (If Applicable).     If you need a refill on your cardiac medications before your next appointment, please call your pharmacy.      Signed, Lesleigh NoeHenry W Smith III, MD  10/26/2015 5:29 PM    [facility] [address], [place], [place]  [address] Phone: [phone](336) [phone]; Fax: [phone]([phone]

## 2015-10-26 NOTE — Patient Instructions (Signed)
Medication Instructions:  Your physician recommends that you continue on your current medications as directed. Please refer to the Current Medication list given to you today.  Labwork: No new orders.   Testing/Procedures: No new orders.   Follow-Up: Your physician recommends that you schedule a follow-up appointment as needed with Dr Katrinka BlazingSmith.    Any Other Special Instructions Will Be Listed Below (If Applicable).     If you need a refill on your cardiac medications before your next appointment, please call your pharmacy.

## 2015-10-29 ENCOUNTER — Encounter: Payer: Self-pay | Admitting: *Deleted

## 2015-10-29 ENCOUNTER — Other Ambulatory Visit: Payer: Self-pay | Admitting: *Deleted

## 2015-10-29 ENCOUNTER — Encounter: Payer: Self-pay | Admitting: Cardiology

## 2015-10-29 NOTE — Patient Outreach (Signed)
[facility]) Care Management [place] Telephone Outreach 10/29/2015  LUCA DYAR 1925-05-05 [id number]  Successful telephone outreach to Oscar Santiago, caregiver and granddaughter of Oscar Santiago HQION,82 y.o.malerecently followed by [place] [facility] for transition of care after recent hospitalization August 12-18, 2017 for STEMI and hematemesis;Patient noted to have history of CHF, A-Fib, HTN/ TIA, pacemaker, hyperlipemia, seizure disorder, CKD, GI bleeding on anticoagulation, and prior MI. Mr. Huston was discharged home with home health [facility]) services through [place]. Patient is a retired Company secretary. HIPAA verified through patient's granddaughter/ caregiver [name], on [facility] written consent.  Today, [name] reports that "things are about the same" with Mr. Vandervelden, again reporting that patient is having difficulty sleeping at night. [name] again confirmed today that Hospice is involved in patient's care and she denies concerns, questions, issues or problems.  [name] confirmed today that patient attended scheduled provider appointment with cardiology provider, which she reported "went well."  We again discussed that patient has met his [facility] CM goal for transition of care, as he has not re-admitted to the hospital, and I explained to [facility] that [facility] and Hospice were both CM providers; today [name] voices understanding and agreement that Hospice will be primary CM provider, and [facility] CM will close patient case.  Emotional support and encouragement was provided to [place].  Plan:  Will close [place] case, as Hospice is actively involved in patient's care, and notify patient's PCP of same.  Oneta Rack, RN, BSN, [employer] [facility] Care Management  [phone]

## 2015-11-01 ENCOUNTER — Telehealth: Payer: Self-pay | Admitting: Family Medicine

## 2015-11-01 NOTE — Telephone Encounter (Signed)
Error/ltd ° °

## 2015-11-02 ENCOUNTER — Ambulatory Visit: Payer: Medicare Other

## 2015-11-14 ENCOUNTER — Telehealth: Payer: Self-pay | Admitting: Interventional Cardiology

## 2015-11-14 NOTE — Telephone Encounter (Signed)
Spoke with granddaughter and informed her of recommendations per Dr.Smith.  Granddaughter verbalized understanding and was appreciative for all assistance.  [facility] and informed her of recommendations per Dr.Smith and information provided by granddaughter from earlier conversation.  Jan verbalized understanding and states that she will speak with Dr. Gibson RampFeldman, Hospice MD.

## 2015-11-14 NOTE — Telephone Encounter (Signed)
We need to call Hospice and recommend they have the supervising Hospice Physician to help with recommendations concerning pain and/or whether patient is now eligible for inpatient hospice services.

## 2015-11-14 NOTE — Telephone Encounter (Signed)
Junious DresserConnie is calling because her grandfather is under hospice care. And he nearing the transitioning stage and is unable to eat, drink or sleep . The .25mg  morphine is not helping and he wants to know if Dr. Katrinka BlazingSmith has any suggestion or anything  to help. Please call    Thanks

## 2015-11-14 NOTE — Telephone Encounter (Signed)
Spoke with granddaughter, Oscar PiccoloMikosha Santiago, DPR on file. She states pt is having chronic back pain at pain level of 8.  Pt has been taking Morphine 0.25mg  every 4 hours. Granddaughter states this is not helping the pt at all.  Pt has been very agitated and restless.  Pt hasn't slept since last Wednesday.  Pt unable to walk or stand anymore.  Pt has not been able to swallow since Sunday.  Pt hasn't had food or water since then.  Hospice gave liquid Haldol since pt was unable to swallow now and pt states it burns his throat too bad to take.  Granddaughter states pt was recently taking Ativan to help with agitation but pt was having terrible hallucinations so it has since been d/c'ed. Granddaughter very concerned about amount of pain pt is in and being so restless and agitated.  She wanted to know if there is anything Dr. Katrinka BlazingSmith could order or recommend to help make pt more comfortable.  Advised I will send message to Dr. Katrinka BlazingSmith for review and advisement.  Granddaughter very appreciative for assistance.  Granddaughter states any orders will have to be called into Hospice at [phone] and asked for nurse manager. Hospice nurse for pt is Renea Smith.

## 2015-11-16 ENCOUNTER — Other Ambulatory Visit: Payer: Self-pay | Admitting: Neurology

## 2015-11-16 ENCOUNTER — Inpatient Hospital Stay (HOSPITAL_COMMUNITY)
Admission: EM | Admit: 2015-11-16 | Discharge: 2015-11-19 | DRG: 101 | Disposition: A | Discharge status: Hospice | Attending: Internal Medicine | Admitting: Internal Medicine

## 2015-11-16 ENCOUNTER — Telehealth: Payer: Self-pay | Admitting: Interventional Cardiology

## 2015-11-16 ENCOUNTER — Encounter (HOSPITAL_COMMUNITY): Payer: Self-pay

## 2015-11-16 DIAGNOSIS — R29810 Facial weakness: Secondary | ICD-10-CM | POA: Diagnosis present

## 2015-11-16 DIAGNOSIS — Z515 Encounter for palliative care: Secondary | ICD-10-CM | POA: Diagnosis not present

## 2015-11-16 DIAGNOSIS — Z66 Do not resuscitate: Secondary | ICD-10-CM | POA: Diagnosis present

## 2015-11-16 DIAGNOSIS — Z823 Family history of stroke: Secondary | ICD-10-CM

## 2015-11-16 DIAGNOSIS — Z6822 Body mass index (BMI) 22.0-22.9, adult: Secondary | ICD-10-CM

## 2015-11-16 DIAGNOSIS — R569 Unspecified convulsions: Secondary | ICD-10-CM | POA: Diagnosis not present

## 2015-11-16 DIAGNOSIS — Z79899 Other long term (current) drug therapy: Secondary | ICD-10-CM

## 2015-11-16 DIAGNOSIS — Z8673 Personal history of transient ischemic attack (TIA), and cerebral infarction without residual deficits: Secondary | ICD-10-CM

## 2015-11-16 DIAGNOSIS — H9193 Unspecified hearing loss, bilateral: Secondary | ICD-10-CM | POA: Diagnosis present

## 2015-11-16 DIAGNOSIS — Z95828 Presence of other vascular implants and grafts: Secondary | ICD-10-CM

## 2015-11-16 DIAGNOSIS — I129 Hypertensive chronic kidney disease with stage 1 through stage 4 chronic kidney disease, or unspecified chronic kidney disease: Secondary | ICD-10-CM | POA: Diagnosis present

## 2015-11-16 DIAGNOSIS — Z950 Presence of cardiac pacemaker: Secondary | ICD-10-CM

## 2015-11-16 DIAGNOSIS — Z888 Allergy status to other drugs, medicaments and biological substances status: Secondary | ICD-10-CM

## 2015-11-16 DIAGNOSIS — E785 Hyperlipidemia, unspecified: Secondary | ICD-10-CM | POA: Diagnosis present

## 2015-11-16 DIAGNOSIS — Z7951 Long term (current) use of inhaled steroids: Secondary | ICD-10-CM

## 2015-11-16 DIAGNOSIS — R64 Cachexia: Secondary | ICD-10-CM | POA: Diagnosis present

## 2015-11-16 DIAGNOSIS — K117 Disturbances of salivary secretion: Secondary | ICD-10-CM

## 2015-11-16 DIAGNOSIS — Z9981 Dependence on supplemental oxygen: Secondary | ICD-10-CM

## 2015-11-16 DIAGNOSIS — Z7902 Long term (current) use of antithrombotics/antiplatelets: Secondary | ICD-10-CM

## 2015-11-16 DIAGNOSIS — G4733 Obstructive sleep apnea (adult) (pediatric): Secondary | ICD-10-CM | POA: Diagnosis present

## 2015-11-16 DIAGNOSIS — I482 Chronic atrial fibrillation: Secondary | ICD-10-CM | POA: Diagnosis present

## 2015-11-16 DIAGNOSIS — G40409 Other generalized epilepsy and epileptic syndromes, not intractable, without status epilepticus: Secondary | ICD-10-CM | POA: Diagnosis not present

## 2015-11-16 DIAGNOSIS — E039 Hypothyroidism, unspecified: Secondary | ICD-10-CM | POA: Diagnosis present

## 2015-11-16 DIAGNOSIS — M109 Gout, unspecified: Secondary | ICD-10-CM | POA: Diagnosis present

## 2015-11-16 DIAGNOSIS — H409 Unspecified glaucoma: Secondary | ICD-10-CM | POA: Diagnosis present

## 2015-11-16 DIAGNOSIS — N183 Chronic kidney disease, stage 3 (moderate): Secondary | ICD-10-CM | POA: Diagnosis present

## 2015-11-16 DIAGNOSIS — R1310 Dysphagia, unspecified: Secondary | ICD-10-CM | POA: Diagnosis present

## 2015-11-16 DIAGNOSIS — M8580 Other specified disorders of bone density and structure, unspecified site: Secondary | ICD-10-CM | POA: Diagnosis present

## 2015-11-16 DIAGNOSIS — Z7982 Long term (current) use of aspirin: Secondary | ICD-10-CM

## 2015-11-16 DIAGNOSIS — Z8249 Family history of ischemic heart disease and other diseases of the circulatory system: Secondary | ICD-10-CM

## 2015-11-16 LAB — CBG MONITORING, ED: Glucose-Capillary: 93 mg/dL (ref 65–99)

## 2015-11-16 MED ORDER — SODIUM CHLORIDE 0.9 % IV SOLN: 250.0000 mL | INTRAVENOUS | Status: DC | PRN

## 2015-11-16 MED ORDER — SODIUM CHLORIDE 0.9% FLUSH
3.0000 mL | Freq: Two times a day (BID) | INTRAVENOUS | Status: DC
  Administered 2015-11-16 – 2015-11-19 (×3): 3 mL via INTRAVENOUS

## 2015-11-16 MED ORDER — LORAZEPAM 2 MG/ML IJ SOLN
1.0000 mg | INTRAMUSCULAR | Status: DC | PRN
  Administered 2015-11-18 – 2015-11-19 (×4): 1 mg via INTRAVENOUS
  Filled 2015-11-16 (×5): qty 1

## 2015-11-16 MED ORDER — LATANOPROST 0.005 % OP SOLN: 1.0000 [drp] | Freq: Every day | OPHTHALMIC | Status: DC

## 2015-11-16 MED ORDER — POLYETHYLENE GLYCOL 3350 17 G PO PACK: 17.0000 g | PACK | Freq: Every day | ORAL | Status: DC | PRN

## 2015-11-16 MED ORDER — FLUTICASONE PROPIONATE 50 MCG/ACT NA SUSP
2.0000 | Freq: Every day | NASAL | Status: DC
  Administered 2015-11-17 – 2015-11-18 (×2): 2 via NASAL
  Filled 2015-11-16: qty 16

## 2015-11-16 MED ORDER — LORAZEPAM 1 MG PO TABS: 1.0000 mg | ORAL_TABLET | ORAL | Status: DC | PRN

## 2015-11-16 MED ORDER — HALOPERIDOL LACTATE 2 MG/ML PO CONC
2.0000 mg | ORAL | Status: DC | PRN
  Filled 2015-11-16: qty 1

## 2015-11-16 MED ORDER — POLYVINYL ALCOHOL 1.4 % OP SOLN
1.0000 [drp] | Freq: Four times a day (QID) | OPHTHALMIC | Status: DC | PRN
  Filled 2015-11-16: qty 15

## 2015-11-16 MED ORDER — PANTOPRAZOLE SODIUM 40 MG PO TBEC
40.0000 mg | DELAYED_RELEASE_TABLET | Freq: Every evening | ORAL | Status: DC
Start: 2015-11-16 — End: 2015-11-16

## 2015-11-16 MED ORDER — BIOTENE DRY MOUTH MT LIQD: 15.0000 mL | OROMUCOSAL | Status: DC | PRN

## 2015-11-16 MED ORDER — SODIUM CHLORIDE 0.9 % IV SOLN
1000.0000 mg | Freq: Once | INTRAVENOUS | Status: AC
  Administered 2015-11-16: 1000 mg via INTRAVENOUS
  Filled 2015-11-16: qty 10

## 2015-11-16 MED ORDER — BIOTENE DRY MOUTH DT GEL: 1.0000 "application " | DENTAL | Status: DC

## 2015-11-16 MED ORDER — MORPHINE SULFATE (PF) 2 MG/ML IV SOLN
1.0000 mg | INTRAVENOUS | Status: DC | PRN
  Administered 2015-11-17 (×4): 1 mg via INTRAVENOUS
  Filled 2015-11-16 (×4): qty 1

## 2015-11-16 MED ORDER — LORAZEPAM 2 MG/ML IJ SOLN
1.0000 mg | INTRAMUSCULAR | Status: DC | PRN
  Administered 2015-11-18 – 2015-11-19 (×3): 1 mg via INTRAVENOUS
  Filled 2015-11-16 (×2): qty 1

## 2015-11-16 MED ORDER — DIAZEPAM 10 MG RE GEL: 15.0000 mg | RECTAL | Status: DC | PRN

## 2015-11-16 MED ORDER — SODIUM CHLORIDE 0.9% FLUSH: 3.0000 mL | INTRAVENOUS | Status: DC | PRN

## 2015-11-16 MED ORDER — LORAZEPAM 2 MG/ML IJ SOLN
1.0000 mg | Freq: Once | INTRAMUSCULAR | Status: AC
  Administered 2015-11-16: 1 mg via INTRAVENOUS
  Filled 2015-11-16: qty 1

## 2015-11-16 MED ORDER — BISACODYL 10 MG RE SUPP: 10.0000 mg | Freq: Every day | RECTAL | Status: DC | PRN

## 2015-11-16 MED ORDER — SENNA-DOCUSATE SODIUM 8.6-50 MG PO TABS: 2.0000 | ORAL_TABLET | Freq: Every day | ORAL | Status: DC | PRN

## 2015-11-16 MED ORDER — ATROPINE SULFATE 1 % OP SOLN
4.0000 [drp] | OPHTHALMIC | Status: DC | PRN
  Administered 2015-11-17 – 2015-11-19 (×6): 4 [drp] via SUBLINGUAL
  Filled 2015-11-16 (×2): qty 2

## 2015-11-16 MED ORDER — LORAZEPAM 2 MG/ML PO CONC: 1.0000 mg | ORAL | Status: DC | PRN

## 2015-11-16 MED ORDER — DORZOLAMIDE HCL-TIMOLOL MAL 2-0.5 % OP SOLN: 1.0000 [drp] | Freq: Two times a day (BID) | OPHTHALMIC | Status: DC

## 2015-11-16 MED ORDER — MORPHINE SULFATE (CONCENTRATE) 10 MG/0.5ML PO SOLN
10.0000 mg | ORAL | Status: DC | PRN
  Administered 2015-11-17: 10 mg via ORAL
  Filled 2015-11-16: qty 0.5

## 2015-11-16 MED ORDER — SODIUM CHLORIDE 0.9 % IV SOLN
500.0000 mg | Freq: Two times a day (BID) | INTRAVENOUS | Status: DC
  Administered 2015-11-16 – 2015-11-19 (×6): 500 mg via INTRAVENOUS
  Filled 2015-11-16 (×6): qty 5

## 2015-11-16 NOTE — ED Provider Notes (Signed)
[facility]-EMERGENCY DEPT Provider Note   CSN: [id number] Arrival date & time: 11/16/15  1326     History   Chief Complaint No chief complaint on file.   HPI ADRIEL KESSEN is a 82 y.o. male.  Pt is brought here by EMS due to seizures.  The pt's daughter is with him and gives the history.  The pt is on hospice and is a DNR patient.  He has had seizures since 2006.  He has been on oral keppra and that has been controlling his seizures.  He has stopped taking anything oral on 10/8.  He had a large seizure this morning around 0230 and had another one today.  The daughter said they tried diastat, but that did not help.  Pt has also been getting ativan 2 mg Q6h which they have been crushing up and putting under tongue, but it takes a long time to absorb and family does not think that works either.  The pt said she called hospice and they did not know what else to do, so they told her to come to the ED.        Past Medical History:  Diagnosis Date  . Carotid arterial disease (HCC)    a. s/p R CEA with residual LICA 80% stenosis pending further eval.  . Chronic systolic dysfunction of left ventricle    a. EF 30-35% by echo 10/10/2009;  b. 10/2014 Echo: EF 40%, m ild LVH, mod MR, sev dil LA, mildly reduced RV fxn, mod TR, PASP .  . CKD (chronic kidney disease), stage III   . Complete heart block (HCC)    a. s/p MDT single chamber PPM - initially placed in 2008 w/ gen change in 2016.  Marland Kitchen Compression fracture of spine (HCC)    T 10 and T11  . Glaucoma   . Gout   . Hearing loss    bilateral -hearing aids  . Hemorrhoid   . History of GI bleed    from coumadin  . Hyperlipidemia   . Hypertension   . Hypothyroid   . Inguinal hernia   . Kidney stone   . Major depression   . MR (mitral regurgitation)   . OSA (obstructive sleep apnea)    on CPAP therapy  . Osteopenia   . Permanent atrial fibrillation (HCC)    a. refuses coumadin due to history of GI bleed - was prev Rx eliquis->never  took.  . Seizure disorder (HCC)   . Stroke [facility])     Patient Active Problem List   Diagnosis Date Noted  . Seizure (HCC) 11/16/2015  . ST elevation myocardial infarction (STEMI) (HCC)   . Acute ST elevation myocardial infarction (STEMI) due to occlusion of left coronary artery (HCC) 09/15/2015  . Acute inferolateral myocardial infarction (HCC) 09/15/2015  . Acute ST elevation myocardial infarction (STEMI) of lateral wall (HCC) 09/15/2015  . Hyponatremia 05/07/2015  . Anemia of chronic disease 05/07/2015  . Physical deconditioning 05/07/2015  . Benign prostatic hypertrophy 04/05/2015  . Carotid stenosis 01/03/2015  . S/P carotid endarterectomy 01/03/2015  . Retention of urine 11/05/2014  . TIA (transient ischemic attack) 10/20/2014  . Hypothyroidism 10/20/2014  . Preventative health care 04/12/2014  . CKD (chronic kidney disease), stage III 12/19/2013  . Seizure disorder (HCC) 12/19/2013  . Complete heart block (HCC) 03/13/2013  . Chronic systolic dysfunction of left ventricle 03/13/2013  . Cardiac pacemaker in situ 01/05/2013  . Chronic atrial fibrillation (CHADVASc = 5)   . Hypertension   .  Hyperlipidemia   . Osteopenia   . Kidney stone   . OSA (obstructive sleep apnea)     Past Surgical History:  Procedure Laterality Date  . CARDIAC CATHETERIZATION  2002  . CARDIAC CATHETERIZATION N/A 09/15/2015   Procedure: Coronary Balloon Angioplasty;  Surgeon: Runell Gess, MD;  Location: [facility] INVASIVE CV LAB;  Service: Cardiovascular;  Laterality: N/A;  . CARDIAC CATHETERIZATION N/A 09/15/2015   Procedure: Coronary/Graft Angiography;  Surgeon: Runell Gess, MD;  Location: [facility] INVASIVE CV LAB;  Service: Cardiovascular;  Laterality: N/A;  . COLONOSCOPY WITH PROPOFOL N/A 05/01/2014   Procedure: COLONOSCOPY WITH PROPOFOL;  Surgeon: [name], MD;  Location: [facility] ENDOSCOPY;  Service: Endoscopy;  Laterality: N/A;  . ENDARTERECTOMY Right 10/23/2014   Procedure: ENDARTERECTOMY CAROTID;   Surgeon: Larina Earthly, MD;  Location: [facility] OR;  Service: Vascular;  Laterality: Right;  . EP IMPLANTABLE DEVICE N/A 12/26/2014   Procedure: PPM Generator Changeout;  Surgeon: Marinus Maw, MD;  Location: [facility] INVASIVE CV LAB;  Service: Cardiovascular;  Laterality: N/A;  . HEMORRHOID SURGERY    . INGUINAL HERNIA REPAIR    . INSERTION OF SUPRAPUBIC CATHETER N/A 04/05/2015   Procedure: INSERTION OF SUPRAPUBIC CATHETER;  Surgeon: Jethro Bolus, MD;  Location: [facility] ORS;  Service: Urology;  Laterality: N/A;  . PACEMAKER INSERTION  07/28/06   VVI pacemaker for complete heart block by Dr Amil Amen  . TRANSURETHRAL RESECTION OF PROSTATE N/A 04/05/2015   Procedure: TRANSURETHRAL RESECTION OF THE PROSTATE (TURP);  Surgeon: Jethro Bolus, MD;  Location: [facility] ORS;  Service: Urology;  Laterality: N/A;       Home Medications    Prior to Admission medications   Medication Sig Start Date End Date Taking? Authorizing Provider  acetaminophen (TYLENOL) 500 MG tablet Take 1 tablet (500 mg total) by mouth every 8 (eight) hours as needed (pain). 11/09/14  Yes Albertine Grates, MD  aspirin 81 MG tablet Take 81 mg by mouth daily.   Yes Historical Provider, MD  atorvastatin (LIPITOR) 40 MG tablet Take 1 tablet (40 mg total) by mouth daily at 6 PM. 09/21/15  Yes Arnetha Courser, MD  atropine 1 % ophthalmic solution Place 4 drops under the tongue every 4 (four) hours as needed (for pain).   Yes Historical Provider, MD  carvedilol (COREG) 3.125 MG tablet Take 1 tablet (3.125 mg total) by mouth 2 (two) times daily with a meal. 09/21/15  Yes Arnetha Courser, MD  cetirizine (ZYRTEC) 10 MG tablet Take 10 mg by mouth daily.   Yes Historical Provider, MD  clopidogrel (PLAVIX) 75 MG tablet Take 1 tablet (75 mg total) by mouth daily with breakfast. 09/21/15  Yes Arnetha Courser, MD  colchicine 0.6 MG tablet Take 0.6 mg by mouth 2 (two) times daily as needed (gout).   Yes Historical Provider, MD  Dentifrices (BIOTENE DRY MOUTH) GEL Take 1  application by mouth every hour.   Yes Historical Provider, MD  dextromethorphan-guaiFENesin (MUCINEX DM) 30-600 MG 12hr tablet Take 1 tablet by mouth 2 (two) times daily.   Yes Historical Provider, MD  diazepam (DIASTAT ACUDIAL) 10 MG GEL Place 15 mg rectally as needed for seizure. Give 15mg  for first seizure, then 15mg  in 4-12 hours for additional seizures   Yes Historical Provider, MD  dorzolamide-timolol (COSOPT) 22.3-6.8 MG/ML ophthalmic solution Place 1 drop into both eyes 2 (two) times daily. 09/09/14  Yes Historical Provider, MD  famotidine (PEPCID AC) 10 MG chewable tablet Chew 10 mg by mouth at bedtime.  Yes Historical Provider, MD  fluticasone (FLONASE) 50 MCG/ACT nasal spray Place 2 sprays into both nostrils daily. 09/10/15  Yes Terressa KoyanagiHannah R Kim, DO  furosemide (LASIX) 20 MG tablet Take 1 tablet (20 mg total) by mouth daily. 03/21/15  Yes Lyn RecordsHenry W Smith, MD  haloperidol (HALDOL) 2 MG/ML solution Take 2 mg by mouth every 4 (four) hours as needed for agitation.   Yes Historical Provider, MD  latanoprost (XALATAN) 0.005 % ophthalmic solution Place 1 drop into both eyes at bedtime.  02/05/14  Yes Historical Provider, MD  levETIRAcetam (KEPPRA) 500 MG tablet Take 250-500 mg by mouth 2 (two) times daily. Take half (1/2) tablet (250 mg total) by mouth each morning. Take one (1) tablet (500 mg total) by mouth each evening.    Yes Historical Provider, MD  levothyroxine (SYNTHROID, LEVOTHROID) 25 MCG tablet TAKE ONE TABLET BY MOUTH ONCE DAILY BEFORE BREAKFAST 08/23/15  Yes Kristian CoveyBruce W Burchette, MD  LORazepam (ATIVAN) 2 MG tablet Take 8 mg by mouth every 4 (four) hours as needed for anxiety. Started giving 8mg  today 10/13, patient's daughter crushes tablets up to give to patient   Yes Historical Provider, MD  morphine 20 MG/5ML solution Take 10 mg by mouth every 2 (two) hours as needed for pain.   Yes Historical Provider, MD  Multiple Vitamin (MULTIVITAMIN WITH MINERALS) TABS tablet Take 1 tablet by mouth daily.    Yes Historical Provider, MD  nitroGLYCERIN (NITROSTAT) 0.4 MG SL tablet Place 1 tablet (0.4 mg total) under the tongue every 5 (five) minutes x 3 doses as needed for chest pain. 09/21/15  Yes Arnetha CourserSumayya Amin, MD  ondansetron (ZOFRAN) 4 MG tablet Take 1 tablet (4 mg total) by mouth every 8 (eight) hours as needed for nausea or vomiting. Patient taking differently: Take 4 mg by mouth every 4 (four) hours as needed for nausea or vomiting.  10/01/15  Yes Azalee CoursePaul S Corotto, MD  OXYGEN Inhale 2 L/min into the lungs as needed (for dyspnea).   Yes Historical Provider, MD  pantoprazole (PROTONIX) 40 MG tablet Take 1 tablet (40 mg total) by mouth daily at 6 (six) AM. Patient taking differently: Take 40 mg by mouth every evening.  10/16/15  Yes Kristian CoveyBruce W Burchette, MD  polyethylene glycol (MIRALAX / GLYCOLAX) packet Take 17 g by mouth daily as needed for mild constipation.   Yes Historical Provider, MD  sennosides-docusate sodium (SENOKOT-S) 8.6-50 MG tablet Take 2 tablets by mouth daily as needed for constipation.   Yes Historical Provider, MD  traMADol-acetaminophen (ULTRACET) 37.5-325 MG tablet Take 1 tablet by mouth every 6 (six) hours as needed for severe pain. 04/16/15  Yes Jethro BolusSigmund Tannenbaum, MD  vitamin B-12 (CYANOCOBALAMIN) 1000 MCG tablet Take 1,000 mcg by mouth daily.   Yes Historical Provider, MD  zolpidem (AMBIEN) 10 MG tablet Take 10 mg by mouth at bedtime.   Yes Historical Provider, MD    Family History Family History  Problem Relation Age of Onset  . Heart failure Mother   . CVA Sister   . Aneurysm Brother     brain  . CVA Brother   . Transient ischemic attack Daughter   . Colon cancer Neg Hx   . Colon polyps Neg Hx     Social History Social History  Substance Use Topics  . Smoking status: Never Smoker  . Smokeless tobacco: Never Used  . Alcohol use No     Allergies   Coumadin [warfarin sodium]; Warfarin; and Lisinopril   Review of Systems  Review of Systems  Neurological: Positive  for seizures.  All other systems reviewed and are negative.    Physical Exam Updated Vital Signs BP 149/66   Pulse 64   Resp 16   Ht 5' 10.5" (1.791 m)   SpO2 98%   Physical Exam  Constitutional: He appears well-developed.  HENT:  Head: Normocephalic and atraumatic.  Right Ear: External ear normal.  Left Ear: External ear normal.  Nose: Nose normal.  Multiple lip and tongue bites from seizures  Neck: Normal range of motion.  Cardiovascular: Normal rate, regular rhythm, normal heart sounds and intact distal pulses.   Pulmonary/Chest: Effort normal.  Abdominal: Soft. Bowel sounds are normal.  Neurological: He is unresponsive.  Nursing note and vitals reviewed.    ED Treatments / Results  Labs (all labs ordered are listed, but only abnormal results are displayed) Labs Reviewed  CBG MONITORING, ED    EKG  EKG Interpretation  Date/Time:  Friday November 16 2015 14:06:54 EDT Ventricular Rate:  83 PR Interval:    QRS Duration: 143 QT Interval:  463 QTC Calculation: 564 R Axis:   85 Text Interpretation:  Ventricular-paced complexes No further analysis attempted due to paced rhythm Confirmed by [facility] MD, Levita Monical ([id number]) on 11/16/2015 2:54:55 PM       Radiology No results found.  Procedures Procedures (including critical care time)  Medications Ordered in ED Medications  levETIRAcetam (KEPPRA) 1,000 mg in sodium chloride 0.9 % 100 mL IVPB (0 mg Intravenous Stopped 11/16/15 1619)  LORazepam (ATIVAN) injection 1 mg (1 mg Intravenous Given 11/16/15 1520)     Initial Impression / Assessment and Plan / ED Course  I have reviewed the triage vital signs and the nursing notes.  Pertinent labs & imaging results that were available during my care of the patient were reviewed by me and considered in my medical decision making (see chart for details).  Clinical Course    Pt d/w both pharmacy and with Dr. Amada Jupiter (neurology) who recommended SL ativan.  The pt's  family request that we admit him overnight to see if we can get his seizures stabilized.  They are aware that pt does not have long to live, they just want him comfortable.  Pt d/w Dr. Laural Benes (triad) who will admit him to palliative care.  Final Clinical Impressions(s) / ED Diagnoses   Final diagnoses:  Seizure [facility])  Palliative care status    New Prescriptions New Prescriptions   No medications on file     Jacalyn Lefevre, MD 11/16/15 1625

## 2015-11-16 NOTE — ED Notes (Signed)
Triage completed by "Aloha GellShelia Almira Phetteplace, RN not Gilda Creaseeandra, NT

## 2015-11-16 NOTE — ED Triage Notes (Signed)
Per EMS- Patient is a hospice Patient. Patient ahs not been eating or drinking in the past few days, therefore not taking his seizure medications. Patient's last seizure was 0400 today. And family refused transportation at that time, but since talking with hospice physician decided to bring the patient to the ED.

## 2015-11-16 NOTE — H&P (Signed)
History and Physical  Oscar Santiago:[id number] DOB: 10-Jul-1925 DOA: 11/16/2015  Referring physician: Particia Nearing, MD PCP: Kristian Covey, MD   Chief Complaint: grand mal seizure  HPI: Oscar Santiago is a 82 y.o. male home hospice patient was brought here by EMS due to seizures unable to control at home as patient no longer swallowing.  The pt's daughter is with him and gives the history.  The pt is on hospice and is a DNR patient.  He has had seizures since 2006.  He has been on oral keppra and that has been controlling his seizures.  He has stopped taking anything oral on 10/8.  He had a large seizure this morning around 0230 and had another one today.  The daughter said they tried diastat, but that did not help.  Pt has also been getting ativan 2 mg Q6h which they have been crushing up and putting under tongue, but it takes a long time to absorb and family does not think that works either.  The pt said she called hospice and they did not know what else to do, so they told her to come to the ED.  Daughter also suspects patient had a new stroke as he now has facial drooping and lost strength in left side.  Family would like to get seizures controlled so that they can try to manage him at home or get him to hospice facility for ongoing care.  Pt loaded with keppra IV in ED.  Sublingual ativan being considered for outpatient use as needed.    Review of Systems: UTO  Past Medical History:  Diagnosis Date  . Carotid arterial disease (HCC)    a. s/p R CEA with residual LICA 80% stenosis pending further eval.  . Chronic systolic dysfunction of left ventricle    a. EF 30-35% by echo 10/10/2009;  b. 10/2014 Echo: EF 40%, m ild LVH, mod MR, sev dil LA, mildly reduced RV fxn, mod TR, PASP .  . CKD (chronic kidney disease), stage III   . Complete heart block (HCC)    a. s/p MDT single chamber PPM - initially placed in 2008 w/ gen change in 2016.  Marland Kitchen Compression fracture of spine (HCC)    T 10  and T11  . Glaucoma   . Gout   . Hearing loss    bilateral -hearing aids  . Hemorrhoid   . History of GI bleed    from coumadin  . Hyperlipidemia   . Hypertension   . Hypothyroid   . Inguinal hernia   . Kidney stone   . Major depression   . MR (mitral regurgitation)   . OSA (obstructive sleep apnea)    on CPAP therapy  . Osteopenia   . Permanent atrial fibrillation (HCC)    a. refuses coumadin due to history of GI bleed - was prev Rx eliquis->never took.  . Seizure disorder (HCC)   . Stroke [facility])    Past Surgical History:  Procedure Laterality Date  . CARDIAC CATHETERIZATION  2002  . CARDIAC CATHETERIZATION N/A 09/15/2015   Procedure: Coronary Balloon Angioplasty;  Surgeon: Runell Gess, MD;  Location: [facility] INVASIVE CV LAB;  Service: Cardiovascular;  Laterality: N/A;  . CARDIAC CATHETERIZATION N/A 09/15/2015   Procedure: Coronary/Graft Angiography;  Surgeon: Runell Gess, MD;  Location: [facility] INVASIVE CV LAB;  Service: Cardiovascular;  Laterality: N/A;  . COLONOSCOPY WITH PROPOFOL N/A 05/01/2014   Procedure: COLONOSCOPY WITH PROPOFOL;  Surgeon: [name], MD;  Location: [facility] ENDOSCOPY;  Service: Endoscopy;  Laterality: N/A;  . ENDARTERECTOMY Right 10/23/2014   Procedure: ENDARTERECTOMY CAROTID;  Surgeon: Larina Earthly, MD;  Location: [facility] OR;  Service: Vascular;  Laterality: Right;  . EP IMPLANTABLE DEVICE N/A 12/26/2014   Procedure: PPM Generator Changeout;  Surgeon: Marinus Maw, MD;  Location: [facility] INVASIVE CV LAB;  Service: Cardiovascular;  Laterality: N/A;  . HEMORRHOID SURGERY    . INGUINAL HERNIA REPAIR    . INSERTION OF SUPRAPUBIC CATHETER N/A 04/05/2015   Procedure: INSERTION OF SUPRAPUBIC CATHETER;  Surgeon: Jethro Bolus, MD;  Location: [facility] ORS;  Service: Urology;  Laterality: N/A;  . PACEMAKER INSERTION  07/28/06   VVI pacemaker for complete heart block by Dr Amil Amen  . TRANSURETHRAL RESECTION OF PROSTATE N/A 04/05/2015   Procedure: TRANSURETHRAL RESECTION OF THE  PROSTATE (TURP);  Surgeon: Jethro Bolus, MD;  Location: [facility] ORS;  Service: Urology;  Laterality: N/A;   Social History:  reports that he has never smoked. He has never used smokeless tobacco. He reports that he does not drink alcohol or use drugs.   Allergies  Allergen Reactions  . Coumadin [Warfarin Sodium] Other (See Comments)    GI Bleed   . Warfarin Other (See Comments)    EXCESS BLDG W/COUMADIN EXCESS BLDG W/COUMADIN  . Lisinopril Cough    Family History  Problem Relation Age of Onset  . Heart failure Mother   . CVA Sister   . Aneurysm Brother     brain  . CVA Brother   . Transient ischemic attack Daughter   . Colon cancer Neg Hx   . Colon polyps Neg Hx       Prior to Admission medications   Medication Sig Start Date End Date Taking? Authorizing Provider  acetaminophen (TYLENOL) 500 MG tablet Take 1 tablet (500 mg total) by mouth every 8 (eight) hours as needed (pain). 11/09/14  Yes Albertine Grates, MD  aspirin 81 MG tablet Take 81 mg by mouth daily.   Yes Historical Provider, MD  atorvastatin (LIPITOR) 40 MG tablet Take 1 tablet (40 mg total) by mouth daily at 6 PM. 09/21/15  Yes Arnetha Courser, MD  atropine 1 % ophthalmic solution Place 4 drops under the tongue every 4 (four) hours as needed (for pain).   Yes Historical Provider, MD  carvedilol (COREG) 3.125 MG tablet Take 1 tablet (3.125 mg total) by mouth 2 (two) times daily with a meal. 09/21/15  Yes Arnetha Courser, MD  cetirizine (ZYRTEC) 10 MG tablet Take 10 mg by mouth daily.   Yes Historical Provider, MD  clopidogrel (PLAVIX) 75 MG tablet Take 1 tablet (75 mg total) by mouth daily with breakfast. 09/21/15  Yes Arnetha Courser, MD  colchicine 0.6 MG tablet Take 0.6 mg by mouth 2 (two) times daily as needed (gout).   Yes Historical Provider, MD  Dentifrices (BIOTENE DRY MOUTH) GEL Take 1 application by mouth every hour.   Yes Historical Provider, MD  dextromethorphan-guaiFENesin (MUCINEX DM) 30-600 MG 12hr tablet Take 1 tablet by  mouth 2 (two) times daily.   Yes Historical Provider, MD  diazepam (DIASTAT ACUDIAL) 10 MG GEL Place 15 mg rectally as needed for seizure. Give 15mg  for first seizure, then 15mg  in 4-12 hours for additional seizures   Yes Historical Provider, MD  dorzolamide-timolol (COSOPT) 22.3-6.8 MG/ML ophthalmic solution Place 1 drop into both eyes 2 (two) times daily. 09/09/14  Yes Historical Provider, MD  famotidine (PEPCID AC) 10 MG chewable tablet Chew 10 mg by  mouth at bedtime.   Yes Historical Provider, MD  fluticasone (FLONASE) 50 MCG/ACT nasal spray Place 2 sprays into both nostrils daily. 09/10/15  Yes Terressa Koyanagi, DO  furosemide (LASIX) 20 MG tablet Take 1 tablet (20 mg total) by mouth daily. 03/21/15  Yes Lyn Records, MD  haloperidol (HALDOL) 2 MG/ML solution Take 2 mg by mouth every 4 (four) hours as needed for agitation.   Yes Historical Provider, MD  latanoprost (XALATAN) 0.005 % ophthalmic solution Place 1 drop into both eyes at bedtime.  02/05/14  Yes Historical Provider, MD  levETIRAcetam (KEPPRA) 500 MG tablet Take 250-500 mg by mouth 2 (two) times daily. Take half (1/2) tablet (250 mg total) by mouth each morning. Take one (1) tablet (500 mg total) by mouth each evening.    Yes Historical Provider, MD  levothyroxine (SYNTHROID, LEVOTHROID) 25 MCG tablet TAKE ONE TABLET BY MOUTH ONCE DAILY BEFORE BREAKFAST 08/23/15  Yes Kristian Covey, MD  LORazepam (ATIVAN) 2 MG tablet Take 8 mg by mouth every 4 (four) hours as needed for anxiety. Started giving 8mg  today 10/13, patient's daughter crushes tablets up to give to patient   Yes Historical Provider, MD  morphine 20 MG/5ML solution Take 10 mg by mouth every 2 (two) hours as needed for pain.   Yes Historical Provider, MD  Multiple Vitamin (MULTIVITAMIN WITH MINERALS) TABS tablet Take 1 tablet by mouth daily.   Yes Historical Provider, MD  nitroGLYCERIN (NITROSTAT) 0.4 MG SL tablet Place 1 tablet (0.4 mg total) under the tongue every 5 (five) minutes x 3  doses as needed for chest pain. 09/21/15  Yes Arnetha Courser, MD  ondansetron (ZOFRAN) 4 MG tablet Take 1 tablet (4 mg total) by mouth every 8 (eight) hours as needed for nausea or vomiting. Patient taking differently: Take 4 mg by mouth every 4 (four) hours as needed for nausea or vomiting.  10/01/15  Yes Azalee Course, MD  OXYGEN Inhale 2 L/min into the lungs as needed (for dyspnea).   Yes Historical Provider, MD  pantoprazole (PROTONIX) 40 MG tablet Take 1 tablet (40 mg total) by mouth daily at 6 (six) AM. Patient taking differently: Take 40 mg by mouth every evening.  10/16/15  Yes Kristian Covey, MD  polyethylene glycol (MIRALAX / GLYCOLAX) packet Take 17 g by mouth daily as needed for mild constipation.   Yes Historical Provider, MD  sennosides-docusate sodium (SENOKOT-S) 8.6-50 MG tablet Take 2 tablets by mouth daily as needed for constipation.   Yes Historical Provider, MD  traMADol-acetaminophen (ULTRACET) 37.5-325 MG tablet Take 1 tablet by mouth every 6 (six) hours as needed for severe pain. 04/16/15  Yes Jethro Bolus, MD  vitamin B-12 (CYANOCOBALAMIN) 1000 MCG tablet Take 1,000 mcg by mouth daily.   Yes Historical Provider, MD  zolpidem (AMBIEN) 10 MG tablet Take 10 mg by mouth at bedtime.   Yes Historical Provider, MD   Physical Exam: Vitals:   11/16/15 1504 11/16/15 1530 11/16/15 1603 11/16/15 1630  BP: 172/70 164/66 149/66 160/66  Pulse: 73 61 64 61  Resp: 17 18 16 17   SpO2: 98% 98% 98% 100%  Height:         General exam: unresponsive appears terminally ill, cachectic  Head, eyes and ENT: dry MM, bite marks on tongue and lips.  Neck: Supple. No JVD.   Lymphatics: No lymphadenopathy.  Respiratory system: shallow BS with course ant upper airway congestion.  Cardiovascular system: S1 and S2 heard.  Gastrointestinal system:  Abdomen is nondistended, soft and nontender. Normal bowel sounds heard. No organomegaly or masses appreciated.  Central nervous system:  unresponsive.  Labs on Admission:  Basic Metabolic Panel: No results for input(s): NA, K, CL, CO2, GLUCOSE, BUN, CREATININE, CALCIUM, MG, PHOS in the last 168 hours. Liver Function Tests: No results for input(s): AST, ALT, ALKPHOS, BILITOT, PROT, ALBUMIN in the last 168 hours. No results for input(s): LIPASE, AMYLASE in the last 168 hours. No results for input(s): AMMONIA in the last 168 hours. CBC: No results for input(s): WBC, NEUTROABS, HGB, HCT, MCV, PLT in the last 168 hours. Cardiac Enzymes: No results for input(s): CKTOTAL, CKMB, CKMBINDEX, TROPONINI in the last 168 hours.  BNP (last 3 results) No results for input(s): PROBNP in the last 8760 hours. CBG:  Recent Labs Lab 11/16/15 1411  GLUCAP 93    Radiological Exams on Admission: No results found.  Assessment/Plan Active Problems:   Palliative care status   Seizure (HCC)   1. Pt being admitted for observation to get his seizures under control.  He was loaded with keppra IV in ED.  Continue IV keppra for now. Consider sublinqual ativan at discharge if possible. Pt unable to swallow at this point and also continue IV ativan while in hospital for any seizure activity.  Family concerned about trying to prevent the seizures versus treating them when they occur.  I'm not sure if we would be able to do that given he is unable to swallow keppra any longer.  Consult social worker to assist with hospice care placement options to discuss with patient.    Code Status: DNR  Family Communication: at bedside  Disposition Plan: TBD   Oscar Dakinslanford Rontrell Moquin, MD [facility] Pager [phone]  If 7PM-7AM, please contact night-coverage [email or web address] Password [email or web address] 11/16/2015, 5:21 PM

## 2015-11-16 NOTE — Telephone Encounter (Signed)
Thanks

## 2015-11-16 NOTE — ED Notes (Signed)
Patient unable to take care of own secretions. Patient suctioned with yankeur's with return of thick yellow/white secretions. Patient's daughter stated, "I think you were a little rough because he made a face and he never does that when he is suctioned. My daddy has been in Hospice and has never made a face when suctioned." Patient's daughter wanted to see the charge nurse. Charge nurse informed.

## 2015-11-16 NOTE — Telephone Encounter (Signed)
New message    Patient went to emergency room at 1:00pm - during the middle night patient had grandmal seizure.    [facility] -@ end of life.

## 2015-11-17 DIAGNOSIS — Z8673 Personal history of transient ischemic attack (TIA), and cerebral infarction without residual deficits: Secondary | ICD-10-CM | POA: Diagnosis not present

## 2015-11-17 DIAGNOSIS — G4733 Obstructive sleep apnea (adult) (pediatric): Secondary | ICD-10-CM | POA: Diagnosis present

## 2015-11-17 DIAGNOSIS — Z515 Encounter for palliative care: Secondary | ICD-10-CM

## 2015-11-17 DIAGNOSIS — I482 Chronic atrial fibrillation: Secondary | ICD-10-CM | POA: Diagnosis present

## 2015-11-17 DIAGNOSIS — Z7982 Long term (current) use of aspirin: Secondary | ICD-10-CM | POA: Diagnosis not present

## 2015-11-17 DIAGNOSIS — M8580 Other specified disorders of bone density and structure, unspecified site: Secondary | ICD-10-CM | POA: Diagnosis present

## 2015-11-17 DIAGNOSIS — Z823 Family history of stroke: Secondary | ICD-10-CM | POA: Diagnosis not present

## 2015-11-17 DIAGNOSIS — H9193 Unspecified hearing loss, bilateral: Secondary | ICD-10-CM | POA: Diagnosis present

## 2015-11-17 DIAGNOSIS — R569 Unspecified convulsions: Secondary | ICD-10-CM | POA: Diagnosis present

## 2015-11-17 DIAGNOSIS — K117 Disturbances of salivary secretion: Secondary | ICD-10-CM | POA: Diagnosis not present

## 2015-11-17 DIAGNOSIS — Z9981 Dependence on supplemental oxygen: Secondary | ICD-10-CM | POA: Diagnosis not present

## 2015-11-17 DIAGNOSIS — Z7951 Long term (current) use of inhaled steroids: Secondary | ICD-10-CM | POA: Diagnosis not present

## 2015-11-17 DIAGNOSIS — Z79899 Other long term (current) drug therapy: Secondary | ICD-10-CM | POA: Diagnosis not present

## 2015-11-17 DIAGNOSIS — I129 Hypertensive chronic kidney disease with stage 1 through stage 4 chronic kidney disease, or unspecified chronic kidney disease: Secondary | ICD-10-CM | POA: Diagnosis present

## 2015-11-17 DIAGNOSIS — Z888 Allergy status to other drugs, medicaments and biological substances status: Secondary | ICD-10-CM | POA: Diagnosis not present

## 2015-11-17 DIAGNOSIS — Z950 Presence of cardiac pacemaker: Secondary | ICD-10-CM | POA: Diagnosis not present

## 2015-11-17 DIAGNOSIS — Z66 Do not resuscitate: Secondary | ICD-10-CM | POA: Diagnosis present

## 2015-11-17 DIAGNOSIS — N183 Chronic kidney disease, stage 3 (moderate): Secondary | ICD-10-CM | POA: Diagnosis present

## 2015-11-17 DIAGNOSIS — E785 Hyperlipidemia, unspecified: Secondary | ICD-10-CM | POA: Diagnosis present

## 2015-11-17 DIAGNOSIS — G40409 Other generalized epilepsy and epileptic syndromes, not intractable, without status epilepticus: Secondary | ICD-10-CM | POA: Diagnosis present

## 2015-11-17 DIAGNOSIS — Z8249 Family history of ischemic heart disease and other diseases of the circulatory system: Secondary | ICD-10-CM | POA: Diagnosis not present

## 2015-11-17 DIAGNOSIS — R64 Cachexia: Secondary | ICD-10-CM | POA: Diagnosis present

## 2015-11-17 DIAGNOSIS — R29810 Facial weakness: Secondary | ICD-10-CM | POA: Diagnosis present

## 2015-11-17 DIAGNOSIS — Z7902 Long term (current) use of antithrombotics/antiplatelets: Secondary | ICD-10-CM | POA: Diagnosis not present

## 2015-11-17 DIAGNOSIS — R1310 Dysphagia, unspecified: Secondary | ICD-10-CM | POA: Diagnosis present

## 2015-11-17 DIAGNOSIS — Z6822 Body mass index (BMI) 22.0-22.9, adult: Secondary | ICD-10-CM | POA: Diagnosis not present

## 2015-11-17 MED ORDER — SCOPOLAMINE 1 MG/3DAYS TD PT72
1.0000 | MEDICATED_PATCH | TRANSDERMAL | Status: DC
  Administered 2015-11-17: 1.5 mg via TRANSDERMAL
  Filled 2015-11-17: qty 1

## 2015-11-17 MED ORDER — MORPHINE SULFATE (PF) 2 MG/ML IV SOLN
1.0000 mg | INTRAVENOUS | Status: DC | PRN
  Administered 2015-11-18: 2 mg via INTRAVENOUS
  Administered 2015-11-18: 1 mg via INTRAVENOUS
  Administered 2015-11-18 – 2015-11-19 (×6): 2 mg via INTRAVENOUS
  Filled 2015-11-17 (×10): qty 1

## 2015-11-17 MED ORDER — GLYCOPYRROLATE 0.2 MG/ML IJ SOLN
0.2000 mg | INTRAMUSCULAR | Status: DC | PRN
  Administered 2015-11-17 – 2015-11-19 (×3): 0.2 mg via INTRAVENOUS
  Filled 2015-11-17 (×5): qty 1

## 2015-11-17 MED ORDER — ORAL CARE MOUTH RINSE
15.0000 mL | Freq: Two times a day (BID) | OROMUCOSAL | Status: DC
  Administered 2015-11-17 – 2015-11-19 (×3): 15 mL via OROMUCOSAL

## 2015-11-17 NOTE — Care Management Note (Signed)
Case Management Note  Patient Details  Name: Heloise OchoaHaywood N Beiser MRN: [id number] Date of Birth: 05/20/1925  Subjective/Objective:   Seizures, comfort care                 Action/Plan: Discharge Planning: Chart reviewed. CSW referral for Residential Hospice.   Expected Discharge Date:                 Expected Discharge Plan:  Hospice Medical Facility  In-House Referral:  Clinical Social Work  Discharge planning Services  CM Consult  Post Acute Care Choice:  NA Choice offered to:  NA  DME Arranged:  N/A DME Agency:  NA  HH Arranged:  NA HH Agency:  NA  Status of Service:  Completed, signed off  If discussed at Long Length of Stay Meetings, dates discussed:    Additional Comments:  Elliot CousinShavis, Nana Vastine Ellen, RN 11/17/2015, 5:02 PM

## 2015-11-17 NOTE — Progress Notes (Signed)
[facility] - Rm 1329 [name], Oscar - [facility] RN GIP visit at 12:00p  This is a GIP, related and covered hospital admission of 11/16/15 with [facility] DX of Combined Systolic and Diastolic Congestive Heart Failure per Dr. Anne FuFeldmann.   [facility] following grand mal seizures.  The [facility] for [facility], nurse, and family for management of the seizures.    Visit with patient and family today in room.  Present for visit was patients brother, a daughter, and a granddaughter that serves as PCG.  They report that patient was agitated during the night and yelling out, God help me.  They report that patient experiences back pain and agitation especially during nighttime hours.  Family reports that patient is getting too weak to cough up secretions and that the Atropine and Scopolamine seem to be helping much more than suctioning.  Small sips of water trigger coughing and choking.  Family declined lunch tray this afternoon.  No seizures during the night and patient is receiving Keppra via peripheral IV.  Patient lethargic during this meeting but opens eyes and rouses briefly to state that he hears what is being said.  Emotional support provided to patient and family.  PPS:  20% Pain:  0/10 during visit, family reports patient recently received pain medication Plan of Care:  [facility] team as [facility] for end-of-life care.  The family expresses desire to take patient home; however, they acknowledge that seizure management may be more than they are able to handle in the home setting along with patients need to be frequently medicated for pain and agitation.  [facility] liaison team will continue to visit daily and collaborate with family and hospital team on goals of care.    Please call with any questions.  Thank You, Katy ApoAngela Syphaseut RN, BSN, [facility] Director of Inpatient Services Office [phone]  [phone] Cell [phone]) [phone]

## 2015-11-17 NOTE — Progress Notes (Signed)
Pt's son, granddaughter and family friend were bedside when I arrived. CH did not disturb pt who was resting. 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 They were appropriately saddened today, but accepting and said they do not want him to suffer. CH had prayer bedside of pt w/family who was very appreciative. Please page if additional support is needed. Chaplain Marjory LiesPamela Carrington Holder, M.Div.   11/17/15 1600  Clinical Encounter Type  Visited With Family

## 2015-11-17 NOTE — Progress Notes (Signed)
PROGRESS NOTE    Oscar Santiago  ZOX:[id number] DOB: [birth year] DOA: 11/16/2015 PCP: Kristian Covey, MD   Brief Narrative: Oscar Santiago is a 82 y.o. male home hospice patient was brought here by EMS due to recurrent seizures at home.   Assessment & Plan:   Active Problems:   Palliative care status   Seizure (HCC)   Recurrent seizures: - as per family, he has been off anti seizure medications , he is on home hospice and he was not give his meds as he has a h/o dysphagia and unable to take oral meds.  - he was loaded with IV keppra and on IV anti seizure meds.  - scopolamine added for secretions.  - ativan prn for agitation.  - pt family open [facility] or residential hospice, .  - palliative care consulted for symptom management.     DVT prophylaxis:  Code Status: DNR Family Communication: daughter at bedside.  Disposition Plan: pending ,    Consultants: palliative care consult for symptom management.   Procedures: none  Antimicrobials: none.    Subjective: Family are concerned that they are nt able to manage his seizures at home.  Are open to [facility] for disposition.   Objective: Vitals:   11/16/15 1630 11/16/15 1743 11/16/15 2037 11/17/15 0535  BP: 160/66 (!) 164/68 (!) 153/60 (!) 156/72  Pulse: 61 68 62 60  Resp: 17 16 16 14   Temp:  98.3 F (36.8 C) 98.1 F (36.7 C) 98.2 F (36.8 C)  TempSrc:  Oral Oral Axillary  SpO2: 100% 100% 98% 100%  Weight:  69.4 kg (153 lb)    Height:  5\' 10"  (1.778 m)      Intake/Output Summary (Last 24 hours) at 11/17/15 1459 Last data filed at 11/17/15 0600  Gross per 24 hour  Intake              100 ml  Output               50 ml  Net               50 ml   Filed Weights   11/16/15 1743  Weight: 69.4 kg (153 lb)    Examination:  General exam: Appears calm and comfortable  Respiratory system: diminished at bases.  Cardiovascular system: S1 & S2 heard, RRR. No JVD, murmurs, rubs, gallops or clicks.  No pedal edema. Gastrointestinal system: Abdomen is nondistended, soft and nontender. No organomegaly or masses felt. Normal bowel sounds heard. Central nervous system: lethargic,      Data Reviewed: I have personally reviewed following labs and imaging studies  CBC: No results for input(s): WBC, NEUTROABS, HGB, HCT, MCV, PLT in the last 168 hours. Basic Metabolic Panel: No results for input(s): NA, K, CL, CO2, GLUCOSE, BUN, CREATININE, CALCIUM, MG, PHOS in the last 168 hours. GFR: CrCl cannot be calculated (Patient's most recent lab result is older than the maximum 21 days allowed.). Liver Function Tests: No results for input(s): AST, ALT, ALKPHOS, BILITOT, PROT, ALBUMIN in the last 168 hours. No results for input(s): LIPASE, AMYLASE in the last 168 hours. No results for input(s): AMMONIA in the last 168 hours. Coagulation Profile: No results for input(s): INR, PROTIME in the last 168 hours. Cardiac Enzymes: No results for input(s): CKTOTAL, CKMB, CKMBINDEX, TROPONINI in the last 168 hours. BNP (last 3 results) No results for input(s): PROBNP in the last 8760 hours. HbA1C: No results for input(s): HGBA1C in the last  72 hours. CBG:  Recent Labs Lab 11/16/15 1411  GLUCAP 93   Lipid Profile: No results for input(s): CHOL, HDL, LDLCALC, TRIG, CHOLHDL, LDLDIRECT in the last 72 hours. Thyroid Function Tests: No results for input(s): TSH, T4TOTAL, FREET4, T3FREE, THYROIDAB in the last 72 hours. Anemia Panel: No results for input(s): VITAMINB12, FOLATE, FERRITIN, TIBC, IRON, RETICCTPCT in the last 72 hours. Sepsis Labs: No results for input(s): PROCALCITON, LATICACIDVEN in the last 168 hours.  No results found for this or any previous visit (from the past 240 hour(s)).       Radiology Studies: No results found.      Scheduled Meds: . fluticasone  2 spray Each Nare Daily  . levETIRAcetam  500 mg Intravenous Q12H  . mouth rinse  15 mL Mouth Rinse BID  . scopolamine   1 patch Transdermal Q72H  . sodium chloride flush  3 mL Intravenous Q12H   Continuous Infusions:    LOS: 0 days    Time spent: 35 MINUTES.     Kathlen ModyAKULA,Agness Sibrian, MD [facility] Pager [phone]   If 7PM-7AM, please contact night-coverage [email or web address] Password [email or web address] 11/17/2015, 2:59 PM

## 2015-11-18 DIAGNOSIS — Z515 Encounter for palliative care: Secondary | ICD-10-CM

## 2015-11-18 NOTE — Progress Notes (Signed)
PROGRESS NOTE    Oscar Santiago  ZOX:[id number] DOB: 06-14-1925 DOA: 11/16/2015 PCP: Kristian Covey, MD   Brief Narrative: Oscar Santiago is a 82 y.o. male home hospice patient was brought here by EMS due to recurrent seizures at home.   Assessment & Plan:   Active Problems:   Palliative care status   Seizure (HCC)   Recurrent seizures: - as per family, he has been off anti seizure medications , he is on home hospice and he was not give his meds as he has a h/o dysphagia and unable to take oral meds.  - he was loaded with IV keppra and on IV anti seizure meds.  - scopolamine added for secretions.  - ativan prn for agitation.  - pt family open to [facility] or residential hospice, .  - palliative care consulted for symptom management.  - plan for PICC line for disposition to [facility] for IV ativan for seizure control .     DVT prophylaxis: scd's  Code Status: DNR Family Communication: daughter at bedside.  Disposition Plan: pending ,    Consultants: palliative care consult for symptom management.   Procedures: none  Antimicrobials: none.    Subjective: Family reports he is more awake today and talking more.   Objective: Vitals:   11/17/15 0535 11/17/15 2156 11/18/15 0624 11/18/15 1436  BP: (!) 156/72 (!) 140/54 (!) 143/61   Pulse: 60 70 (!) 59 63  Resp: 14 15 15    Temp: 98.2 F (36.8 C) 99.3 F (37.4 C) 98.2 F (36.8 C)   TempSrc: Axillary Axillary Axillary   SpO2: 100% 100% 100% 100%  Weight:      Height:        Intake/Output Summary (Last 24 hours) at 11/18/15 1446 Last data filed at 11/18/15 1437  Gross per 24 hour  Intake              100 ml  Output              700 ml  Net             -600 ml   Filed Weights   11/16/15 1743  Weight: 69.4 kg (153 lb)    Examination:  General exam: Appears calm and comfortable  Respiratory system: diminished at bases.  Cardiovascular system: S1 & S2 heard, RRR. No JVD, murmurs, rubs, gallops  or clicks. No pedal edema. Gastrointestinal system: Abdomen is nondistended, soft and nontender. No organomegaly or masses felt. Normal bowel sounds heard. Central nervous system: lethargic,      Data Reviewed: I have personally reviewed following labs and imaging studies  CBC: No results for input(s): WBC, NEUTROABS, HGB, HCT, MCV, PLT in the last 168 hours. Basic Metabolic Panel: No results for input(s): NA, K, CL, CO2, GLUCOSE, BUN, CREATININE, CALCIUM, MG, PHOS in the last 168 hours. GFR: CrCl cannot be calculated (Patient's most recent lab result is older than the maximum 21 days allowed.). Liver Function Tests: No results for input(s): AST, ALT, ALKPHOS, BILITOT, PROT, ALBUMIN in the last 168 hours. No results for input(s): LIPASE, AMYLASE in the last 168 hours. No results for input(s): AMMONIA in the last 168 hours. Coagulation Profile: No results for input(s): INR, PROTIME in the last 168 hours. Cardiac Enzymes: No results for input(s): CKTOTAL, CKMB, CKMBINDEX, TROPONINI in the last 168 hours. BNP (last 3 results) No results for input(s): PROBNP in the last 8760 hours. HbA1C: No results for input(s): HGBA1C in the last 72  hours. CBG:  Recent Labs Lab 11/16/15 1411  GLUCAP 93   Lipid Profile: No results for input(s): CHOL, HDL, LDLCALC, TRIG, CHOLHDL, LDLDIRECT in the last 72 hours. Thyroid Function Tests: No results for input(s): TSH, T4TOTAL, FREET4, T3FREE, THYROIDAB in the last 72 hours. Anemia Panel: No results for input(s): VITAMINB12, FOLATE, FERRITIN, TIBC, IRON, RETICCTPCT in the last 72 hours. Sepsis Labs: No results for input(s): PROCALCITON, LATICACIDVEN in the last 168 hours.  No results found for this or any previous visit (from the past 240 hour(s)).       Radiology Studies: No results found.      Scheduled Meds: . fluticasone  2 spray Each Nare Daily  . levETIRAcetam  500 mg Intravenous Q12H  . mouth rinse  15 mL Mouth Rinse BID  .  scopolamine  1 patch Transdermal Q72H  . sodium chloride flush  3 mL Intravenous Q12H   Continuous Infusions:    LOS: 1 day    Time spent: 25 MINUTES.     Kathlen ModyAKULA,Ardice Boyan, MD [facility] Pager [phone]   If 7PM-7AM, please contact night-coverage [email or web address] Password [email or web address] 11/18/2015, 2:46 PM

## 2015-11-18 NOTE — Consult Note (Addendum)
Consultation Note Date: 11/18/2015   Patient Name: Oscar Santiago  DOB: 1925-12-31  MRN: [id number]  Age / Sex: 82 y.o., male  PCP: Eulas Post, MD Referring Physician: Hosie Poisson, MD  Reason for Consultation: Non pain symptom management  HPI/Patient Profile: 82 y.o. male  with past medical history of seizure disorder on home hospice admitted on 11/16/2015 with uncontrolled seizures.   Clinical Assessment and Goals of Care: I met today with patient and his brother.  Also spoke with his granddaughter, who is primary caregiver, via phone.  He has been home on hospice, and lost ability to swallow his keppra.  Had resulting recurrence of seizures and brought to hospital.  He has had no further episodes since being loaded again with keppra.  Hospice liaision met with family to discuss options for discharge moving forward earlier today.   SUMMARY OF RECOMMENDATIONS   - Appears symptomatically controlled at this time.  No distress on exam.  No further seizure activity. - We will need to await hospice recs on options that they will be able to support moving forward.   - I believe that he will best be served by transition to [employer].   Code Status/Advance Care Planning:  DNR   Symptom Management:   Pain/SOB: increase morphine to 1-82m Q2hr prn.  Secretions: addition of robinul to prior regimen  Seizures: continue same while inpatient. Await hospice recs on what they will be able to accomodate moving forward.  Palliative Prophylaxis:   Aspiration and Frequent Pain Assessment  Additional Recommendations (Limitations, Scope, Preferences):  Full Comfort Care  Psycho-social/Spiritual:   Desire for further Chaplaincy support:yes  Additional Recommendations: Grief/Bereavement Support  Prognosis:   Hours - Days  Discharge Planning: Hospice facility most likely     Primary  Diagnoses: Present on Admission: **None**   I have reviewed the medical record, interviewed the patient and family, and examined the patient. The following aspects are pertinent.  Past Medical History:  Diagnosis Date  . Carotid arterial disease ([place]    a. s/p R CEA with residual LICA 886%stenosis pending further eval.  . Chronic systolic dysfunction of left ventricle    a. EF 30-35% by echo 10/10/2009;  b. 10/2014 Echo: EF 40%, m ild LVH, mod MR, sev dil LA, mildly reduced RV fxn, mod TR, PASP 366mg.  . CKD (chronic kidney disease), stage III   . Complete heart block (HCKiln   a. s/p MDT single chamber PPM - initially placed in 2008 w/ gen change in 2016.  . Marland Kitchenompression fracture of spine (HCFulda   T 10 and T11  . Glaucoma   . Gout   . Hearing loss    bilateral -hearing aids  . Hemorrhoid   . History of GI bleed    from coumadin  . Hyperlipidemia   . Hypertension   . Hypothyroid   . Inguinal hernia   . Kidney stone   . Major depression   . MR (mitral regurgitation)   . OSA (obstructive  sleep apnea)    on CPAP therapy  . Osteopenia   . Permanent atrial fibrillation (HCC)    a. refuses coumadin due to history of GI bleed - was prev Rx eliquis->never took.  . Seizure disorder ([place])   . Stroke [facility])    Social History   Social History  . Marital status: Widowed    Spouse name: N/A  . Number of children: 6  . Years of education: N/A   Occupational History  . Retired     Theme park manager   Social History Main Topics  . Smoking status: Never Smoker  . Smokeless tobacco: Never Used  . Alcohol use No  . Drug use: No  . Sexual activity: Not Asked   Other Topics Concern  . None   Social History Narrative   Widowed - wife passed 53 years ago   Presenter, broadcasting for 30 years.  Recently retired   Lives alone   6 children      Physician roster:   Dr. Tamala Julian - Cardiology   Dr. Carlean Purl - Gastroenterology   Dr. Janice Norrie - Urology   [facility]hthalmology   Family History  Problem  Relation Age of Onset  . Heart failure Mother   . CVA Sister   . Aneurysm Brother     brain  . CVA Brother   . Transient ischemic attack Daughter   . Colon cancer Neg Hx   . Colon polyps Neg Hx    Scheduled Meds: . fluticasone  2 spray Each Nare Daily  . levETIRAcetam  500 mg Intravenous Q12H  . mouth rinse  15 mL Mouth Rinse BID  . scopolamine  1 patch Transdermal Q72H  . sodium chloride flush  3 mL Intravenous Q12H   Continuous Infusions:  PRN Meds:.sodium chloride, antiseptic oral rinse, atropine, bisacodyl, glycopyrrolate, haloperidol, LORazepam **OR** LORazepam **OR** LORazepam, LORazepam, morphine injection, morphine CONCENTRATE, polyvinyl alcohol, sodium chloride flush Medications Prior to Admission:  Prior to Admission medications   Medication Sig Start Date End Date Taking? Authorizing Provider  acetaminophen (TYLENOL) 500 MG tablet Take 1 tablet (500 mg total) by mouth every 8 (eight) hours as needed (pain). 11/09/14  Yes Florencia Reasons, MD  aspirin 81 MG tablet Take 81 mg by mouth daily.   Yes Historical Provider, MD  atorvastatin (LIPITOR) 40 MG tablet Take 1 tablet (40 mg total) by mouth daily at 6 PM. 09/21/15  Yes Lorella Nimrod, MD  atropine 1 % ophthalmic solution Place 4 drops under the tongue every 4 (four) hours as needed (for pain).   Yes Historical Provider, MD  carvedilol (COREG) 3.125 MG tablet Take 1 tablet (3.125 mg total) by mouth 2 (two) times daily with a meal. 09/21/15  Yes Lorella Nimrod, MD  cetirizine (ZYRTEC) 10 MG tablet Take 10 mg by mouth daily.   Yes Historical Provider, MD  clopidogrel (PLAVIX) 75 MG tablet Take 1 tablet (75 mg total) by mouth daily with breakfast. 09/21/15  Yes Lorella Nimrod, MD  colchicine 0.6 MG tablet Take 0.6 mg by mouth 2 (two) times daily as needed (gout).   Yes Historical Provider, MD  Dentifrices (BIOTENE DRY MOUTH) GEL Take 1 application by mouth every hour.   Yes Historical Provider, MD  dextromethorphan-guaiFENesin (MUCINEX DM) 30-600  MG 12hr tablet Take 1 tablet by mouth 2 (two) times daily.   Yes Historical Provider, MD  diazepam (DIASTAT ACUDIAL) 10 MG GEL Place 15 mg rectally as needed for seizure. Give 25m for first seizure, then 141min 4-12  hours for additional seizures   Yes Historical Provider, MD  dorzolamide-timolol (COSOPT) 22.3-6.8 MG/ML ophthalmic solution Place 1 drop into both eyes 2 (two) times daily. 09/09/14  Yes Historical Provider, MD  famotidine (PEPCID AC) 10 MG chewable tablet Chew 10 mg by mouth at bedtime.   Yes Historical Provider, MD  fluticasone (FLONASE) 50 MCG/ACT nasal spray Place 2 sprays into both nostrils daily. 09/10/15  Yes Lucretia Kern, DO  furosemide (LASIX) 20 MG tablet Take 1 tablet (20 mg total) by mouth daily. 03/21/15  Yes Belva Crome, MD  haloperidol (HALDOL) 2 MG/ML solution Take 2 mg by mouth every 4 (four) hours as needed for agitation.   Yes Historical Provider, MD  latanoprost (XALATAN) 0.005 % ophthalmic solution Place 1 drop into both eyes at bedtime.  02/05/14  Yes Historical Provider, MD  levETIRAcetam (KEPPRA) 500 MG tablet Take 250-500 mg by mouth 2 (two) times daily. Take half (1/2) tablet (250 mg total) by mouth each morning. Take one (1) tablet (500 mg total) by mouth each evening.    Yes Historical Provider, MD  levothyroxine (SYNTHROID, LEVOTHROID) 25 MCG tablet TAKE ONE TABLET BY MOUTH ONCE DAILY BEFORE BREAKFAST 08/23/15  Yes Eulas Post, MD  LORazepam (ATIVAN) 2 MG tablet Take 8 mg by mouth every 4 (four) hours as needed for anxiety. Started giving 22m today 10/13, patient's daughter crushes tablets up to give to patient   Yes Historical Provider, MD  morphine 20 MG/5ML solution Take 10 mg by mouth every 2 (two) hours as needed for pain.   Yes Historical Provider, MD  Multiple Vitamin (MULTIVITAMIN WITH MINERALS) TABS tablet Take 1 tablet by mouth daily.   Yes Historical Provider, MD  nitroGLYCERIN (NITROSTAT) 0.4 MG SL tablet Place 1 tablet (0.4 mg total) under the  tongue every 5 (five) minutes x 3 doses as needed for chest pain. 09/21/15  Yes SLorella Nimrod MD  ondansetron (ZOFRAN) 4 MG tablet Take 1 tablet (4 mg total) by mouth every 8 (eight) hours as needed for nausea or vomiting. Patient taking differently: Take 4 mg by mouth every 4 (four) hours as needed for nausea or vomiting.  10/01/15  Yes PClayborne Dana MD  OXYGEN Inhale 2 L/min into the lungs as needed (for dyspnea).   Yes Historical Provider, MD  pantoprazole (PROTONIX) 40 MG tablet Take 1 tablet (40 mg total) by mouth daily at 6 (six) AM. Patient taking differently: Take 40 mg by mouth every evening.  10/16/15  Yes BEulas Post MD  polyethylene glycol (MIRALAX / GLYCOLAX) packet Take 17 g by mouth daily as needed for mild constipation.   Yes Historical Provider, MD  sennosides-docusate sodium (SENOKOT-S) 8.6-50 MG tablet Take 2 tablets by mouth daily as needed for constipation.   Yes Historical Provider, MD  traMADol-acetaminophen (ULTRACET) 37.5-325 MG tablet Take 1 tablet by mouth every 6 (six) hours as needed for severe pain. 04/16/15  Yes SCarolan Clines MD  vitamin B-12 (CYANOCOBALAMIN) 1000 MCG tablet Take 1,000 mcg by mouth daily.   Yes Historical Provider, MD  zolpidem (AMBIEN) 10 MG tablet Take 10 mg by mouth at bedtime.   Yes Historical Provider, MD   Allergies  Allergen Reactions  . Coumadin [Warfarin Sodium] Other (See Comments)    GI Bleed   . Warfarin Other (See Comments)    EXCESS BLDG W/COUMADIN EXCESS BLDG W/COUMADIN  . Lisinopril Cough   Review of Systems  Unable to obtain  Physical Exam  General: somnolent, in  no acute distress.  HEENT: No bruits, no goiter, no JVD Heart: Regular rate and rhythm. No murmur appreciated. Lungs: Diminished air movement, clear Abdomen: Soft, nontender, nondistended, positive bowel sounds.  Ext: No significant edema Skin: Warm and dry  Vital Signs: BP (!) 143/61 (BP Location: Right Arm)   Pulse (!) 59   Temp 98.2 F (36.8  C) (Axillary)   Resp 15   Ht _0  (1.778 m)   Wt 69.4 kg (153 lb)   SpO2 100%   BMI 21.95 kg/m  Pain Assessment: Faces   Pain Score: Asleep   SpO2: SpO2: 100 % O2 Device:SpO2: 100 % O2 Flow Rate: .O2 Flow Rate (L/min): 2 L/min  IO: Intake/output summary:  Intake/Output Summary (Last 24 hours) at 11/18/15 0808 Last data filed at 11/18/15 0624  Gross per 24 hour  Intake              100 ml  Output              200 ml  Net             -100 ml    LBM:   Baseline Weight: Weight: 69.4 kg (153 lb) Most recent weight: Weight: 69.4 kg (153 lb)     Palliative Assessment/Data: 20%     Time In: 1435 Time Out: 1530 Time Total: 55 Greater than 50%  of this time was spent counseling and coordinating care related to the above assessment and plan.  Signed by: Micheline Rough, MD   Please contact Palliative Medicine Team phone at [phone] for questions and concerns.  For individual provider: See Shea Evans

## 2015-11-18 NOTE — Progress Notes (Addendum)
Daily Progress Note   Patient Name: Oscar Santiago       Date: 11/18/2015 DOB: 07-25-1925  Age: 82 y.o. MRN#: [id number] Attending Physician: Hosie Poisson, MD Primary Care Physician: Eulas Post, MD Admit Date: 11/16/2015  Reason for Consultation/Follow-up: Disposition, Non pain symptom management and Terminal Care  Subjective: Met this AM with patient family, including [name].  She reports that he has been comfortable No further seizure activity.   She does report that he remains agitated at times throughout the night. He has been talking to people nobody else can see and has been discussing with his family that a bus will be picking him up soon as he is going to be going on a trip.  Performed anticipatory counseling regarding likely pathway forward as he continues transition in process of actively dying.  Length of Stay: 1  Current Medications: Scheduled Meds:  . fluticasone  2 spray Each Nare Daily  . levETIRAcetam  500 mg Intravenous Q12H  . mouth rinse  15 mL Mouth Rinse BID  . scopolamine  1 patch Transdermal Q72H  . sodium chloride flush  3 mL Intravenous Q12H    Continuous Infusions:    PRN Meds: sodium chloride, antiseptic oral rinse, atropine, bisacodyl, glycopyrrolate, haloperidol, LORazepam **OR** LORazepam **OR** LORazepam, LORazepam, morphine injection, morphine CONCENTRATE, polyvinyl alcohol, sodium chloride flush  Physical Exam     General: somnolent, in no acute distress.  HEENT: No bruits, no goiter, no JVD Heart: Regular rate and rhythm. No murmur appreciated. Lungs: Diminished air movement, clear Abdomen: Soft, nontender, nondistended, positive bowel sounds.  Ext: No significant edema Skin: Warm and dry      Vital Signs: BP (!) 143/61 (BP  Location: Right Arm)   Pulse 63   Temp 98.2 F (36.8 C) (Axillary)   Resp 15   Ht _0  (1.778 m)   Wt 69.4 kg (153 lb)   SpO2 100%   BMI 21.95 kg/m  SpO2: SpO2: 100 % O2 Device: O2 Device: Nasal Cannula O2 Flow Rate: O2 Flow Rate (L/min): 2 L/min  Intake/output summary:  Intake/Output Summary (Last 24 hours) at 11/18/15 1713 Last data filed at 11/18/15 1437  Gross per 24 hour  Intake                0 ml  Output  700 ml  Net             -700 ml   LBM:   Baseline Weight: Weight: 69.4 kg (153 lb) Most recent weight: Weight: 69.4 kg (153 lb)       Palliative Assessment/Data:      Patient Active Problem List   Diagnosis Date Noted  . Seizure ([place]) 11/16/2015  . ST elevation myocardial infarction (STEMI) ([place])   . Acute ST elevation myocardial infarction (STEMI) due to occlusion of left coronary artery ([place]) 09/15/2015  . Acute inferolateral myocardial infarction ([place]) 09/15/2015  . Acute ST elevation myocardial infarction (STEMI) of lateral wall ([place]) 09/15/2015  . Hyponatremia 05/07/2015  . Anemia of chronic disease 05/07/2015  . Physical deconditioning 05/07/2015  . Benign prostatic hypertrophy 04/05/2015  . Carotid stenosis 01/03/2015  . S/P carotid endarterectomy 01/03/2015  . Retention of urine 11/05/2014  . TIA (transient ischemic attack) 10/20/2014  . Hypothyroidism 10/20/2014  . Palliative care status 04/12/2014  . CKD (chronic kidney disease), stage III 12/19/2013  . Seizure disorder ([place]) 12/19/2013  . Complete heart block ([place]) 03/13/2013  . Chronic systolic dysfunction of left ventricle 03/13/2013  . Cardiac pacemaker in situ 01/05/2013  . Chronic atrial fibrillation (CHADVASc = 5)   . Hypertension   . Hyperlipidemia   . Osteopenia   . Kidney stone   . OSA (obstructive sleep apnea)     Palliative Care Assessment & Plan   Recommendations/Plan:  Seizures: Discussed with hospice. Plan to continue same regimen while inpatient. Plan  for PICC placement tomorrow with discharge to [facility] as long as he remains stable to do so.  Goals of Care and Additional Recommendations:  Limitations on Scope of Treatment: Full Comfort Care  Code Status:    Code Status Orders        Start     Ordered   11/16/15 1718  Do not attempt resuscitation (DNR)  Continuous    Question Answer Comment  In the event of cardiac or respiratory ARREST Do not call a "code blue"   In the event of cardiac or respiratory ARREST Do not perform Intubation, CPR, defibrillation or ACLS   In the event of cardiac or respiratory ARREST Use medication by any route, position, wound care, and other measures to relive pain and suffering. May use oxygen, suction and manual treatment of airway obstruction as needed for comfort.      11/16/15 1721    Code Status History    Date Active Date Inactive Code Status Order ID Comments User Context   09/17/2015 11:19 AM 09/18/2015  5:12 PM DNR [id number]  Sherren Mocha, MD Inpatient   09/15/2015  6:09 PM 09/17/2015 11:19 AM Full Code [id number]  Lorretta Harp, MD Inpatient   09/15/2015  6:09 PM 09/15/2015  6:09 PM Full Code [id number]  Rogelia Mire, NP Inpatient   05/07/2015 10:01 AM 05/10/2015  4:49 PM Full Code [id number]  Rondel Jumbo, PA-C ED   12/26/2014  9:43 AM 12/26/2014  5:22 PM Full Code [id number]  Evans Lance, MD Inpatient   11/04/2014  8:25 PM 11/09/2014  7:47 PM Full Code [id number]  Edwin Dada, MD Inpatient   10/23/2014  5:59 PM 10/29/2014  2:07 AM Full Code [id number]  Ulyses Amor, PA-C Inpatient   10/20/2014 11:55 AM 10/23/2014  5:59 PM Full Code [id number]  Samella Parr, NP Inpatient    Advance Directive Documentation   Flowsheet Row Most Recent Value  Type of  Advance Directive  Out of facility DNR (pink MOST or yellow form)  Pre-existing out of facility DNR order (yellow form or pink MOST form)  No data  "MOST" Form in Place?  No data       Prognosis:   < 2 weeks  Discharge  Planning:  Hospice facility  Care plan was discussed with patient, family, Dr. Galvin Proffer from hospice  Thank you for allowing the Palliative Medicine Team to assist in the care of this patient.   Time In: 1000 Time Out: 1025 Total Time 25 Prolonged Time Billed No      Greater than 50%  of this time was spent counseling and coordinating care related to the above assessment and plan.  Micheline Rough, MD  Please contact Palliative Medicine Team phone at [phone] for questions and concerns.

## 2015-11-18 NOTE — Progress Notes (Signed)
[facility] Rm 1329 [name], Oscar Santiago visit  [facility]  Liaison to follow-up tomorrow to complete paperwork and ensure patient is stable for PICC placement and transport.  Please call with any questions/needs.   Katy ApoAngela Syphaseut, Santiago, BSN, [facility] Director of Inpatient [facility] Office [phone] Cell [phone]

## 2015-11-18 NOTE — Progress Notes (Addendum)
[facility] Rm 1329 [name], [place] - [facility] RN GIP visit at 9:05am  This is a GIP, related and covered hospital admission of 11/16/15 with [facility] DX of Combined Systolic and Diastolic Congestive Heart Failure per Dr. Anne FuFeldmann.   [facility] following grand mal seizures.  The [facility] for [facility], nurse, and family for management of the seizures.    Visit with patient and family.  Granddaughter PCG Cleophus MoltConnie Gatson present.  Patient sitting up in bed with eyes open and acknowledging family and visitors in the room.  Patient attempting to talk; however, this is difficult due to congestion at back of patient's throat.  Family attempt to suction out secretions and report that congestion worsened after attempt to give patient water.  No seizures overnight.  Family reports that patient was visioning, calling out to deceased loved ones and talking about lights.  Brief discussion of symptoms and family verbalizes understanding that patient is at EOL.  Call placed to Dr. Neale BurlyFreeman to update on patient's status and request PICC line placement [facility].  Will follow-up with family later today.  PPS:  20% Pain:  0/10 [facility]  Please call with any questions/needs.   Katy ApoAngela Syphaseut, RN, BSN, [facility] Director of Inpatient [facility] Office [phone] Cell [phone]

## 2015-11-19 ENCOUNTER — Inpatient Hospital Stay (HOSPITAL_COMMUNITY)

## 2015-11-19 DIAGNOSIS — K117 Disturbances of salivary secretion: Secondary | ICD-10-CM

## 2015-11-19 MED ORDER — SODIUM CHLORIDE 0.9% FLUSH: 10.0000 mL | INTRAVENOUS | Status: DC | PRN

## 2015-11-19 MED ORDER — GLYCOPYRROLATE 0.2 MG/ML IJ SOLN: 0.2000 mg | INTRAMUSCULAR | Status: AC | PRN

## 2015-11-19 MED ORDER — ALBUTEROL SULFATE (2.5 MG/3ML) 0.083% IN NEBU
5.0000 mg | INHALATION_SOLUTION | Freq: Once | RESPIRATORY_TRACT | Status: AC
  Administered 2015-11-19: 5 mg via RESPIRATORY_TRACT
  Filled 2015-11-19: qty 6

## 2015-11-19 MED ORDER — SCOPOLAMINE 1 MG/3DAYS TD PT72: 1.0000 | MEDICATED_PATCH | TRANSDERMAL | 12 refills | Status: AC

## 2015-11-19 MED ORDER — MORPHINE SULFATE (PF) 2 MG/ML IV SOLN: 1.0000 mg | INTRAVENOUS | 0 refills | Status: AC | PRN

## 2015-11-19 MED ORDER — LORAZEPAM 2 MG/ML IJ SOLN: 1.0000 mg | INTRAMUSCULAR | 0 refills | Status: AC | PRN

## 2015-11-19 MED ORDER — POLYVINYL ALCOHOL 1.4 % OP SOLN: 1.0000 [drp] | Freq: Four times a day (QID) | OPHTHALMIC | 0 refills | Status: AC | PRN

## 2015-11-19 MED ORDER — LORAZEPAM 2 MG/ML PO CONC: 1.0000 mg | ORAL | 0 refills | Status: AC | PRN

## 2015-11-19 MED ORDER — BISACODYL 10 MG RE SUPP: 10.0000 mg | Freq: Every day | RECTAL | 0 refills | Status: AC | PRN

## 2015-11-19 NOTE — Progress Notes (Addendum)
Daily Progress Note   Patient Name: Oscar Santiago       Date: 11/19/2015 DOB: 20-Apr-1925  Age: 82 y.o. MRN#: [id number] Attending Physician: Hosie Poisson, MD Primary Care Physician: Eulas Post, MD Admit Date: 11/16/2015  Reason for Consultation/Follow-up: Disposition, Non pain symptom management and Terminal Care  Subjective: Met this AM with patient family, including [name].  She reports that patient was restless last night.  No further seizure activity.   We discussed plan moving forward for PICC placement and transition to [facility] for EOL care.  Family had questions regarding plan for management of symptoms that I answered in conjunction with [facility] liaisions.  Performed anticipatory counseling regarding likely pathway forward as he continues transition in process of actively dying.  Length of Stay: 2  Current Medications: Scheduled Meds:  . fluticasone  2 spray Each Nare Daily  . levETIRAcetam  500 mg Intravenous Q12H  . mouth rinse  15 mL Mouth Rinse BID  . scopolamine  1 patch Transdermal Q72H  . sodium chloride flush  3 mL Intravenous Q12H    Continuous Infusions:    PRN Meds: sodium chloride, antiseptic oral rinse, atropine, bisacodyl, glycopyrrolate, haloperidol, LORazepam **OR** LORazepam **OR** LORazepam, LORazepam, morphine injection, morphine CONCENTRATE, polyvinyl alcohol, sodium chloride flush  Physical Exam     General: somnolent, in no acute distress.  HEENT: No bruits, no goiter, no JVD Heart: Regular rate and rhythm. No murmur appreciated. Strong peripheral pulses Lungs: Diminished air movement, clear, regular respirations. Abdomen: Soft, nontender, nondistended, positive bowel sounds.  Ext: No significant edema Skin: Warm and dry      Vital  Signs: BP (!) 153/71 (BP Location: Left Arm)   Pulse 66   Temp 99 F (37.2 C) (Axillary)   Resp 17   Ht _0  (1.778 m)   Wt 69.4 kg (153 lb)   SpO2 99%   BMI 21.95 kg/m  SpO2: SpO2: 99 % O2 Device: O2 Device: Nasal Cannula O2 Flow Rate: O2 Flow Rate (L/min): 2 L/min  Intake/output summary:   Intake/Output Summary (Last 24 hours) at 11/19/15 1201 Last data filed at 11/19/15 0603  Gross per 24 hour  Intake                0 ml  Output  700 ml  Net             -700 ml   LBM: Last BM Date:  (pta) Baseline Weight: Weight: 69.4 kg (153 lb) Most recent weight: Weight: 69.4 kg (153 lb)       Palliative Assessment/Data:      Patient Active Problem List   Diagnosis Date Noted  . Terminal care   . Seizure (HCC) 11/16/2015  . ST elevation myocardial infarction (STEMI) (HCC)   . Acute ST elevation myocardial infarction (STEMI) due to occlusion of left coronary artery (HCC) 09/15/2015  . Acute inferolateral myocardial infarction (HCC) 09/15/2015  . Acute ST elevation myocardial infarction (STEMI) of lateral wall (HCC) 09/15/2015  . Hyponatremia 05/07/2015  . Anemia of chronic disease 05/07/2015  . Physical deconditioning 05/07/2015  . Benign prostatic hypertrophy 04/05/2015  . Carotid stenosis 01/03/2015  . S/P carotid endarterectomy 01/03/2015  . Retention of urine 11/05/2014  . TIA (transient ischemic attack) 10/20/2014  . Hypothyroidism 10/20/2014  . Palliative care status 04/12/2014  . CKD (chronic kidney disease), stage III 12/19/2013  . Seizure disorder (HCC) 12/19/2013  . Complete heart block (HCC) 03/13/2013  . Chronic systolic dysfunction of left ventricle 03/13/2013  . Cardiac pacemaker in situ 01/05/2013  . Chronic atrial fibrillation (CHADVASc = 5)   . Hypertension   . Hyperlipidemia   . Osteopenia   . Kidney stone   . OSA (obstructive sleep apnea)     Palliative Care Assessment & Plan   Recommendations/Plan:  Seizures: Discussed again  with family and hospice. Plan to continue same regimen while inpatient. Plan for PICC placement today followed by discharge to [facility].  Secretions: improved on current regimen.  Continue same.  Goals of Care and Additional Recommendations:  Limitations on Scope of Treatment: Full Comfort Care  Code Status:    Code Status Orders        Start     Ordered   11/16/15 1718  Do not attempt resuscitation (DNR)  Continuous    Question Answer Comment  In the event of cardiac or respiratory ARREST Do not call a "code blue"   In the event of cardiac or respiratory ARREST Do not perform Intubation, CPR, defibrillation or ACLS   In the event of cardiac or respiratory ARREST Use medication by any route, position, wound care, and other measures to relive pain and suffering. May use oxygen, suction and manual treatment of airway obstruction as needed for comfort.      11/16/15 1721    Code Status History    Date Active Date Inactive Code Status Order ID Comments User Context   09/17/2015 11:19 AM 09/18/2015  5:12 PM DNR [id number]  Tonny Bollman, MD Inpatient   09/15/2015  6:09 PM 09/17/2015 11:19 AM Full Code [id number]  Runell Gess, MD Inpatient   09/15/2015  6:09 PM 09/15/2015  6:09 PM Full Code [id number]  Ok Anis, NP Inpatient   05/07/2015 10:01 AM 05/10/2015  4:49 PM Full Code [id number]  Marcos Eke, PA-C ED   12/26/2014  9:43 AM 12/26/2014  5:22 PM Full Code [id number]  Marinus Maw, MD Inpatient   11/04/2014  8:25 PM 11/09/2014  7:47 PM Full Code [id number]  Alberteen Sam, MD Inpatient   10/23/2014  5:59 PM 10/29/2014  2:07 AM Full Code [id number]  Lars Mage, PA-C Inpatient   10/20/2014 11:55 AM 10/23/2014  5:59 PM Full Code [id number]  Russella Dar, NP Inpatient    Advance  Directive Documentation   Flowsheet Row Most Recent Value  Type of Advance Directive  Out of facility DNR (pink MOST or yellow form)  Pre-existing out of facility DNR order (yellow form or pink  MOST form)  No data  "MOST" Form in Place?  No data       Prognosis:   < 2 weeks  Discharge Planning:  Hospice facility  Care plan was discussed with patient, family, Dr. Roselie Skinner and Collie Siad from hospice  Thank you for allowing the Palliative Medicine Team to assist in the care of this patient.   Time In: 1000 Time Out: 1025 Total Time 25 Prolonged Time Billed No      Greater than 50%  of this time was spent counseling and coordinating care related to the above assessment and plan.  Micheline Rough, MD  Please contact Palliative Medicine Team phone at [phone] for questions and concerns.

## 2015-11-19 NOTE — Progress Notes (Signed)
[facility]  Called to give report, no one answers

## 2015-11-19 NOTE — Progress Notes (Signed)
[facility] called to give report, no one answered

## 2015-11-19 NOTE — Progress Notes (Signed)
Peripherally Inserted Central Catheter/Midline Placement  The IV Nurse has discussed with the patient and/or persons authorized to consent for the patient, the purpose of this procedure and the potential benefits and risks involved with this procedure.  The benefits include less needle sticks, lab draws from the catheter, and the patient may be discharged home with the catheter. Risks include, but not limited to, infection, bleeding, blood clot (thrombus formation), and puncture of an artery; nerve damage and irregular heartbeat and possibility to perform a PICC exchange if needed/ordered by physician.  Alternatives to this procedure were also discussed.  B[employer] PICC patient education guide, fact sheet on infection prevention and patient information card has been provided to patient /or left at bedside.    PICC/Midline Placement Documentation    Information given to granddaughter who is POA.    [name], [name] Renee 11/19/2015, 3:14 PM

## 2015-11-19 NOTE — Progress Notes (Signed)
[facility] spoke with Okey Regalarol. [facility]

## 2015-11-19 NOTE — Progress Notes (Signed)
[facility]. Discharge Summary faxed to [phone]. Waiting for PICC to be placed.

## 2015-11-19 NOTE — Progress Notes (Signed)
[facility] Liaison RN visit Spoke with patient's granddaughter, Oscar MoltConnie Gatson, at bedside.  Dr. Romie MinusGene Freeman with Palliative Medicine also present.  Junious Dresser[facility]nie agrees [facility].  Patient will need PICC line placed before transfer.  Suggest patient receive his noon dose of Keppra and be pre-medicated for pain prior to ambulance transport.  Spoke with [name], Rn.  Family signed financial [facility].  Please fax discharge summary to [phone].  Rn:  Please call report to [phone].  [facility] [phone]).  Please identify patient as an [facility] patient.  Thank you.  Kristine GarbeWendy Slingerland, RN, BSN [facility] Liaison [phone]

## 2015-11-19 NOTE — Progress Notes (Signed)
Nutrition Brief Note  Pt identified as at nutrition risk on the Malnutrition Screen Tool.  Chart reviewed. Pt transitioning to comfort care.  No nutrition interventions warranted at this time.  Please consult as needed.   Theador Jezewski, MS, RD, LDN Pager: [phone] After Hours Pager: [phone]    

## 2015-11-19 NOTE — Progress Notes (Addendum)
Family insistent on pt receiving Ativan and morphine q 4 hr and q 2 hr respectively. Pt appears comfortable.Family thinks his breathing changed and that he's looking uncomfortable. I did not assess the same thing. Family refused to have pt repositioned or for him to have oral care, because they didn't want him to get agitated.Low grade temp 99.5 aux.

## 2015-11-19 NOTE — Discharge Summary (Signed)
Physician Discharge Summary  Oscar Santiago ZOX:[id number] DOB: April 28, 1925 DOA: 11/16/2015  PCP: Kristian Covey, MD  Admit date: 11/16/2015 Discharge date: 11/19/2015  Admitted From: Home hospice.  Disposition:  [facility].   Recommendations for Outpatient Follow-up:  1. Follow up with hospice MD as needed.    Equipment/Devices:oxygen.   Discharge Condition:hospice CODE STATUS:comfort care.  Diet recommendation: comfort feeds.   Brief/Interim Summary: Oscar Santiago a 82 y.o.malehome hospice patient was brought here by EMS due to recurrent seizures at home.   Discharge Diagnoses:  Active Problems:   Palliative care status   Seizure [facility])   Terminal care  Recurrent seizures: - as per family, he has been off anti seizure medications , he is on home hospice and he was not give his meds as he has a h/o dysphagia and unable to take oral meds.  - he was loaded with IV keppra and on IV anti seizure meds.  - scopolamine added for secretions.  - ativan prn for agitation.  - pt family open to [facility] or residential hospice, .  - palliative care consulted for symptom management.  - plan for [facility] for [facility] for IV ativan for seizure control   Discharge Instructions  Discharge Instructions    Discharge instructions    Complete by:  As directed    Follow up with Hospice MD as needed.       Medication List    STOP taking these medications   aspirin 81 MG tablet   atorvastatin 40 MG tablet Commonly known as:  LIPITOR   BIOTENE DRY MOUTH Gel   cetirizine 10 MG tablet Commonly known as:  ZYRTEC   clopidogrel 75 MG tablet Commonly known as:  PLAVIX   colchicine 0.6 MG tablet   dextromethorphan-guaiFENesin 30-600 MG 12hr tablet Commonly known as:  MUCINEX DM   famotidine 10 MG chewable tablet Commonly known as:  PEPCID AC   furosemide 20 MG tablet Commonly known as:  LASIX   levETIRAcetam 500 MG tablet Commonly known  as:  KEPPRA   levothyroxine 25 MCG tablet Commonly known as:  SYNTHROID, LEVOTHROID   multivitamin with minerals Tabs tablet   nitroGLYCERIN 0.4 MG SL tablet Commonly known as:  NITROSTAT   ondansetron 4 MG tablet Commonly known as:  ZOFRAN   pantoprazole 40 MG tablet Commonly known as:  PROTONIX   polyethylene glycol packet Commonly known as:  MIRALAX / GLYCOLAX   sennosides-docusate sodium 8.6-50 MG tablet Commonly known as:  SENOKOT-S   traMADol-acetaminophen 37.5-325 MG tablet Commonly known as:  ULTRACET   vitamin B-12 1000 MCG tablet Commonly known as:  CYANOCOBALAMIN   zolpidem 10 MG tablet Commonly known as:  AMBIEN     TAKE these medications   acetaminophen 500 MG tablet Commonly known as:  TYLENOL Take 1 tablet (500 mg total) by mouth every 8 (eight) hours as needed (pain).   atropine 1 % ophthalmic solution Place 4 drops under the tongue every 4 (four) hours as needed (for pain).   bisacodyl 10 MG suppository Commonly known as:  DULCOLAX Place 1 suppository (10 mg total) rectally daily as needed for moderate constipation.   carvedilol 3.125 MG tablet Commonly known as:  COREG Take 1 tablet (3.125 mg total) by mouth 2 (two) times daily with a meal.   DIASTAT ACUDIAL 10 MG Gel Generic drug:  diazepam Place 15 mg rectally as needed for seizure. Give 15mg  for first seizure, then 15mg  in 4-12 hours for  additional seizures   dorzolamide-timolol 22.3-6.8 MG/ML ophthalmic solution Commonly known as:  COSOPT Place 1 drop into both eyes 2 (two) times daily.   fluticasone 50 MCG/ACT nasal spray Commonly known as:  FLONASE Place 2 sprays into both nostrils daily.   glycopyrrolate 0.2 MG/ML injection Commonly known as:  ROBINUL Inject 1 mL (0.2 mg total) into the vein every 4 (four) hours as needed (excessive secretions).   haloperidol 2 MG/ML solution Commonly known as:  HALDOL Take 2 mg by mouth every 4 (four) hours as needed for agitation.    latanoprost 0.005 % ophthalmic solution Commonly known as:  XALATAN Place 1 drop into both eyes at bedtime.   LORazepam 2 MG tablet Commonly known as:  ATIVAN Take 8 mg by mouth every 4 (four) hours as needed for anxiety. Started giving 8mg  today 10/13, patient's daughter crushes tablets up to give to patient What changed:  Another medication with the same name was added. Make sure you understand how and when to take each.   LORazepam 2 MG/ML injection Commonly known as:  ATIVAN Inject 0.5 mLs (1 mg total) into the vein every 4 (four) hours as needed for seizure. What changed:  You were already taking a medication with the same name, and this prescription was added. Make sure you understand how and when to take each.   LORazepam 2 MG/ML concentrated solution Commonly known as:  ATIVAN Place 0.5 mLs (1 mg total) under the tongue every 4 (four) hours as needed for anxiety. What changed:  You were already taking a medication with the same name, and this prescription was added. Make sure you understand how and when to take each.   morphine 20 MG/5ML solution Take 10 mg by mouth every 2 (two) hours as needed for pain. What changed:  Another medication with the same name was added. Make sure you understand how and when to take each.   morphine 2 MG/ML injection Inject 0.5-1 mLs (1-2 mg total) into the vein every 2 (two) hours as needed (or dyspnea). What changed:  You were already taking a medication with the same name, and this prescription was added. Make sure you understand how and when to take each.   OXYGEN Inhale 2 L/min into the lungs as needed (for dyspnea).   polyvinyl alcohol 1.4 % ophthalmic solution Commonly known as:  LIQUIFILM TEARS Place 1 drop into both eyes 4 (four) times daily as needed for dry eyes.   scopolamine 1 MG/3DAYS Commonly known as:  TRANSDERM-SCOP Place 1 patch (1.5 mg total) onto the skin every 3 (three) days. Start taking on:  11/20/2015        Allergies  Allergen Reactions  . Coumadin [Warfarin Sodium] Other (See Comments)    GI Bleed   . Warfarin Other (See Comments)    EXCESS BLDG W/COUMADIN EXCESS BLDG W/COUMADIN  . Lisinopril Cough    Consultations:  Palliative care    Procedures/Studies:  No results found.    Subjective: No new complaints.   Discharge Exam: Vitals:   11/18/15 1436 11/18/15 2054  BP:  (!) 153/71  Pulse: 63 66  Resp:  17  Temp:  99 F (37.2 C)   Vitals:   11/18/15 0624 11/18/15 1436 11/18/15 2054 11/19/15 0319  BP: (!) 143/61  (!) 153/71   Pulse: (!) 59 63 66   Resp: 15  17   Temp: 98.2 F (36.8 C)  99 F (37.2 C)   TempSrc: Axillary  Axillary   SpO2:  100% 100% 100% 99%  Weight:      Height:        General: Pt is alert, awake, not in acute distress Cardiovascular: RRR, S1/S2 +, Respiratory: scattered rhonchi.  Abdominal: Soft, NT, ND, bowel sounds + Extremities: no edema, no cyanosis    The results of significant diagnostics from this hospitalization (including imaging, microbiology, ancillary and laboratory) are listed below for reference.     Microbiology: No results found for this or any previous visit (from the past 240 hour(s)).   Labs: BNP (last 3 results)  Recent Labs  05/07/15 0530  BNP 261.9*   Basic Metabolic Panel: No results for input(s): NA, K, CL, CO2, GLUCOSE, BUN, CREATININE, CALCIUM, MG, PHOS in the last 168 hours. Liver Function Tests: No results for input(s): AST, ALT, ALKPHOS, BILITOT, PROT, ALBUMIN in the last 168 hours. No results for input(s): LIPASE, AMYLASE in the last 168 hours. No results for input(s): AMMONIA in the last 168 hours. CBC: No results for input(s): WBC, NEUTROABS, HGB, HCT, MCV, PLT in the last 168 hours. Cardiac Enzymes: No results for input(s): CKTOTAL, CKMB, CKMBINDEX, TROPONINI in the last 168 hours. BNP: Invalid input(s): POCBNP CBG:  Recent Labs Lab 11/16/15 1411  GLUCAP 93   D-Dimer No results  for input(s): DDIMER in the last 72 hours. Hgb A1c No results for input(s): HGBA1C in the last 72 hours. Lipid Profile No results for input(s): CHOL, HDL, LDLCALC, TRIG, CHOLHDL, LDLDIRECT in the last 72 hours. Thyroid function studies No results for input(s): TSH, T4TOTAL, T3FREE, THYROIDAB in the last 72 hours.  Invalid input(s): FREET3 Anemia work up No results for input(s): VITAMINB12, FOLATE, FERRITIN, TIBC, IRON, RETICCTPCT in the last 72 hours. Urinalysis    Component Value Date/Time   COLORURINE AMBER (A) 09/16/2015 2020   APPEARANCEUR CLOUDY (A) 09/16/2015 2020   LABSPEC 1.046 (H) 09/16/2015 2020   PHURINE 5.5 09/16/2015 2020   GLUCOSEU NEGATIVE 09/16/2015 2020   HGBUR LARGE (A) 09/16/2015 2020   BILIRUBINUR SMALL (A) 09/16/2015 2020   BILIRUBINUR neg 04/13/2015 1628   KETONESUR 15 (A) 09/16/2015 2020   PROTEINUR 100 (A) 09/16/2015 2020   UROBILINOGEN 0.2 04/13/2015 1628   UROBILINOGEN 1.0 11/04/2014 1642   NITRITE NEGATIVE 09/16/2015 2020   LEUKOCYTESUR TRACE (A) 09/16/2015 2020   Sepsis Labs Invalid input(s): PROCALCITONIN,  WBC,  LACTICIDVEN Microbiology No results found for this or any previous visit (from the past 240 hour(s)).   Time coordinating discharge: Over 30 minutes  SIGNED:   Kathlen Mody, MD  [facility] 11/19/2015, 11:02 AM Pager   If 7PM-7AM, please contact night-coverage [email or web address] Password [email or web address]

## 2015-11-26 ENCOUNTER — Telehealth: Payer: Self-pay | Admitting: Interventional Cardiology

## 2015-11-26 NOTE — Telephone Encounter (Signed)
Yes, I will sign the death certificate.  

## 2015-11-26 NOTE — Telephone Encounter (Signed)
New message   Jessica PriestWanetta calling from the [facility] is calling to see if Dr.Smith will sign off

## 2015-11-27 NOTE — Telephone Encounter (Signed)
Follow up   Oscar Santiago is calling to speak to the rn about the death certificate   Please call

## 2015-11-27 NOTE — Telephone Encounter (Signed)
Spoke with Lucy AntiguaWynette at [facility] made her aware Dr.Smith will be signing  D/C on patient. She will drop off d/c today after 2pm. I will staff message Dr.Smith and Victorino DikeJennifer B and make them  Both aware of what's going on and see when he is available to sign.

## 2015-11-27 NOTE — Telephone Encounter (Signed)
Will forward this message to the appropriate party for further review and follow-up accordingly.

## 2015-11-28 ENCOUNTER — Telehealth: Payer: Self-pay | Admitting: Interventional Cardiology

## 2015-11-28 NOTE — Telephone Encounter (Signed)
Original D/C Dropped off By [facility] [facility].

## 2015-11-28 NOTE — Telephone Encounter (Signed)
Dr.Smith signed D/C on this patient. Called and spoke with [place]etta at [facility] [facility] [facility] and made her ready for pick up.

## 2015-11-29 ENCOUNTER — Telehealth: Payer: Self-pay | Admitting: Interventional Cardiology

## 2015-11-29 NOTE — Telephone Encounter (Signed)
[facility]

## 2015-12-04 ENCOUNTER — Telehealth: Payer: Self-pay | Admitting: Family Medicine

## 2015-12-04 NOTE — Telephone Encounter (Signed)
Oscar KnackMikosha called to let you know that [facility].

## 2015-12-05 DEATH — deceased

## 2016-01-14 ENCOUNTER — Encounter: Payer: Medicare Other | Admitting: *Deleted

## 2016-01-21 ENCOUNTER — Ambulatory Visit: Payer: Medicare Other | Admitting: Family Medicine

## 2017-05-09 IMAGING — CT CT HEAD W/O CM
1 series · 16 of 30 positions shown, 20 images · non-contrast
Comparison: CT head without contrast 04/22/2009.

CLINICAL DATA: Seizures beginning at 11 o'clock last night.
Generalized weakness and fatigue.

EXAM:
CT HEAD WITHOUT CONTRAST
TECHNIQUE: Contiguous axial images were obtained from the base of the skull
through the vertex without intravenous contrast.

[Series 2: head 5.0 h30s · axial · 0.44mm/px · z∈[-134,+6]mm · 16 of 32 slices shown, 20 images]
[im 2/32  brain]
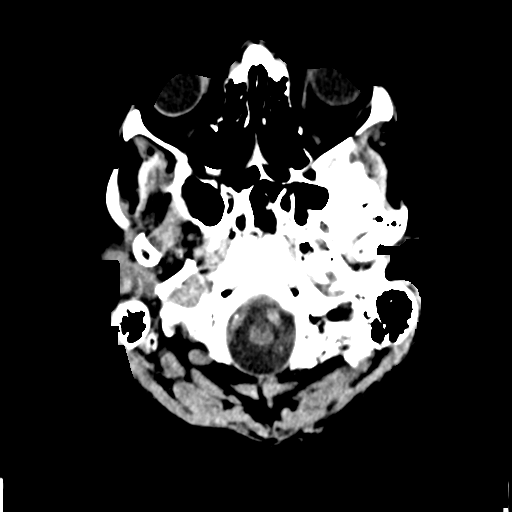
[im 2/32  bone]
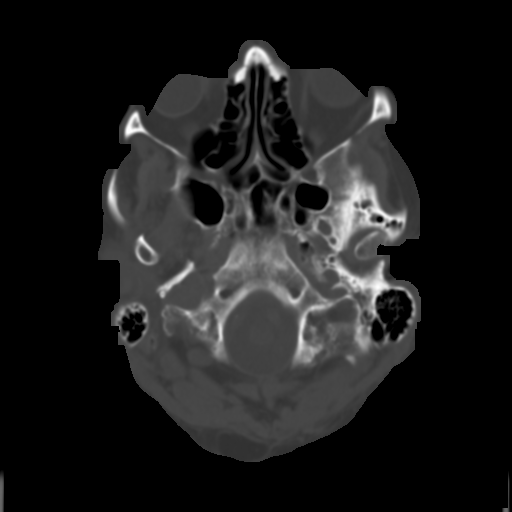
[im 4/32  brain]
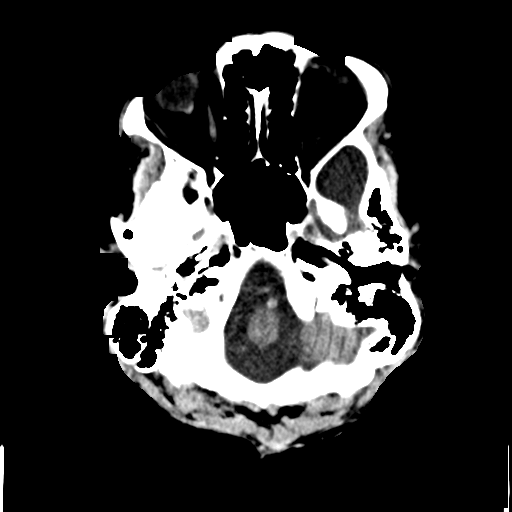
[im 6/32  brain]
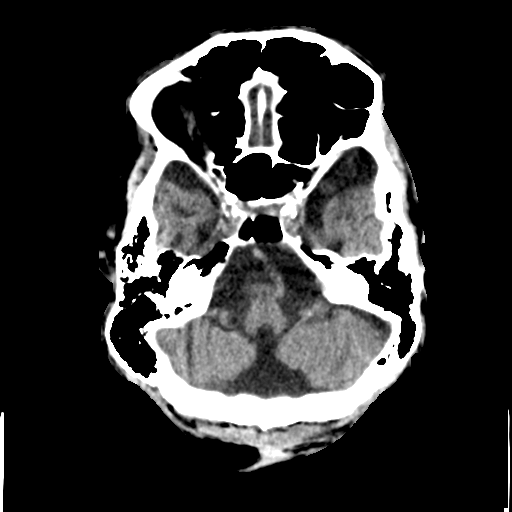
[im 8/32  brain]
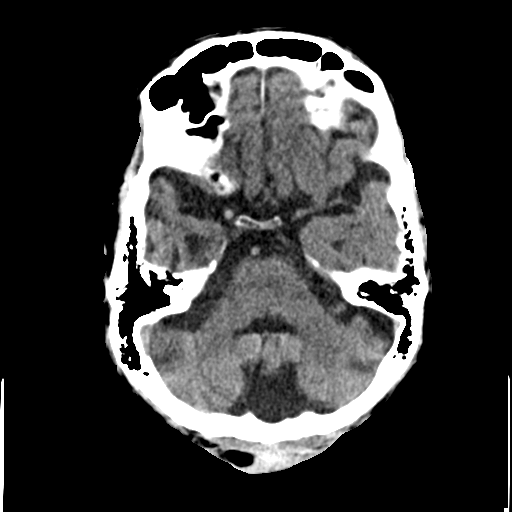
[im 9/32  brain]
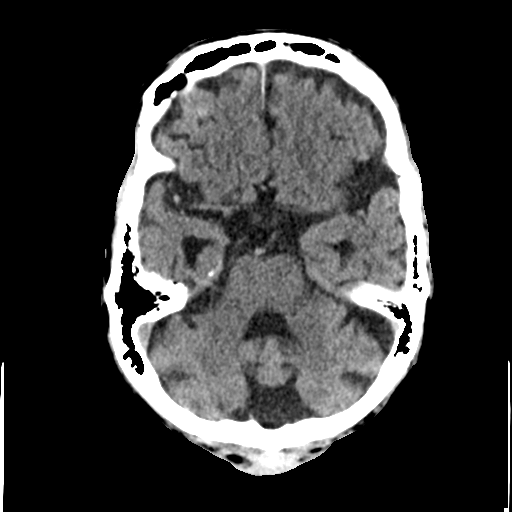
[im 9/32  bone]
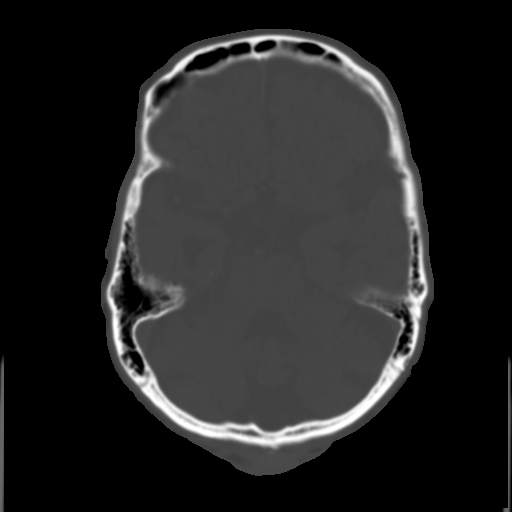
[im 11/32  brain]
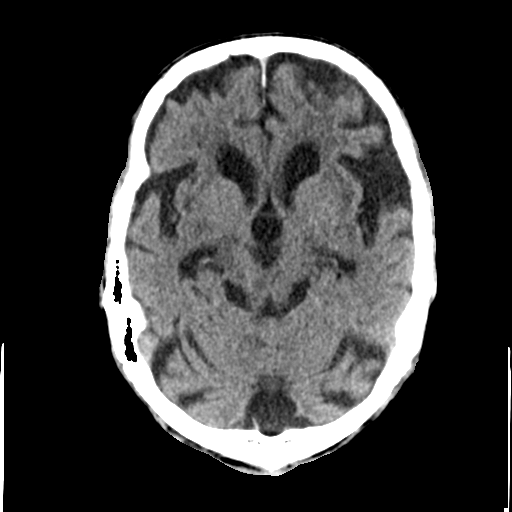
[im 13/32  brain]
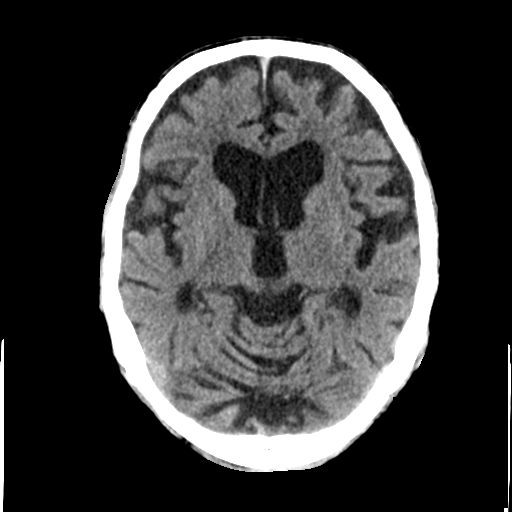
[im 15/32  brain]
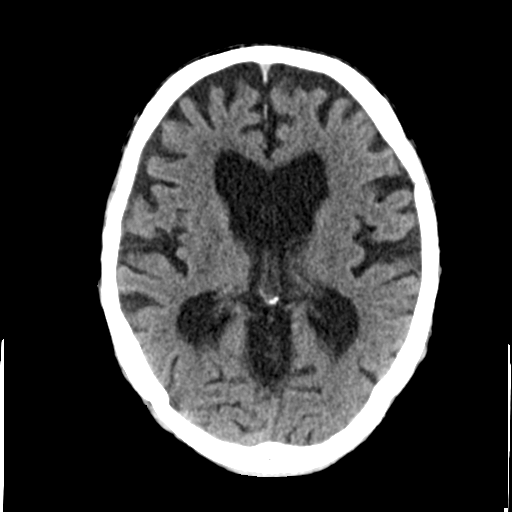
[im 17/32  brain]
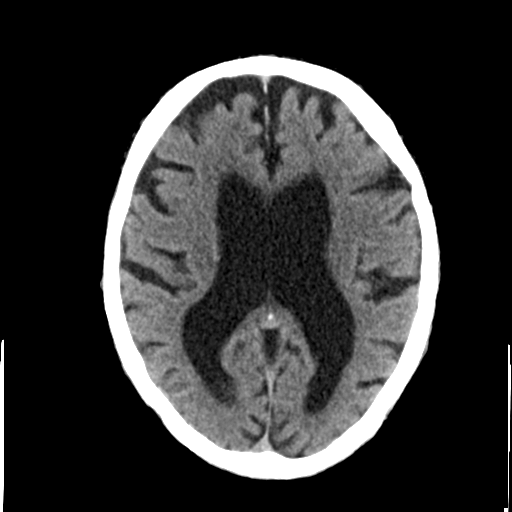
[im 17/32  bone]
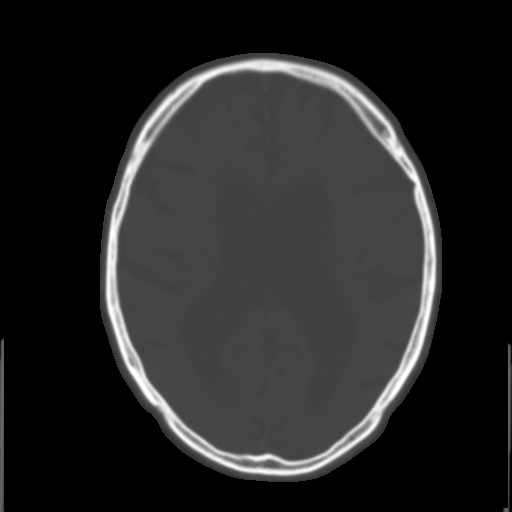
[im 19/32  brain]
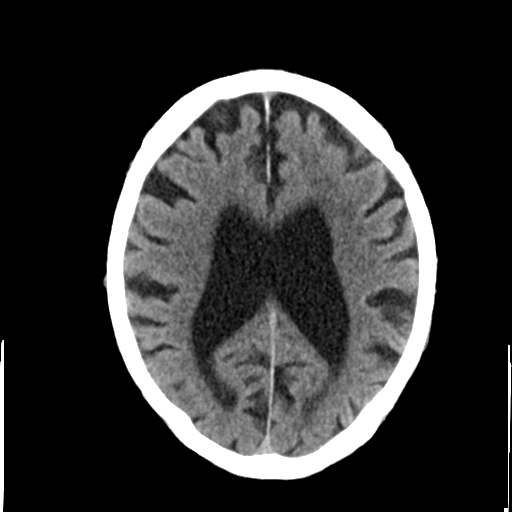
[im 21/32  brain]
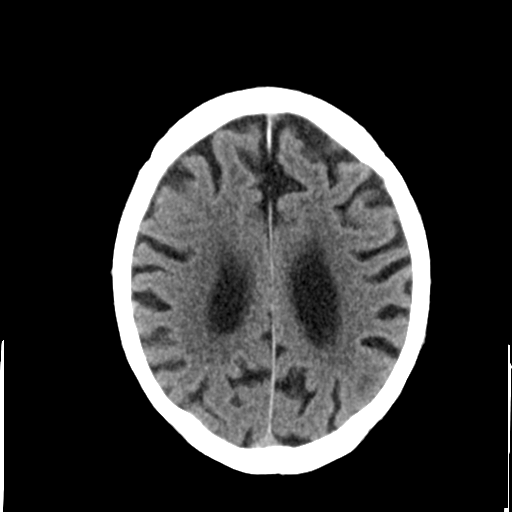
[im 23/32  brain]
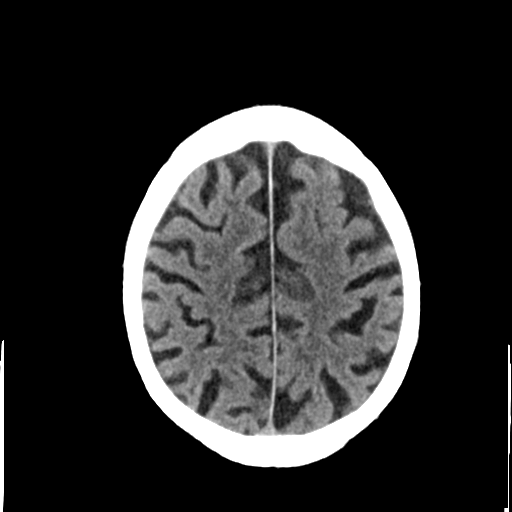
[im 24/32  brain]
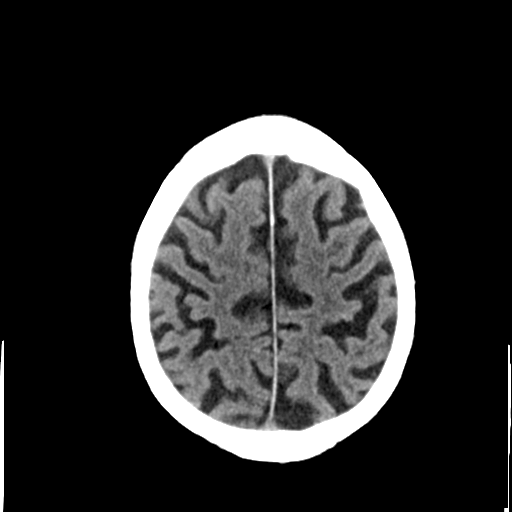
[im 24/32  bone]
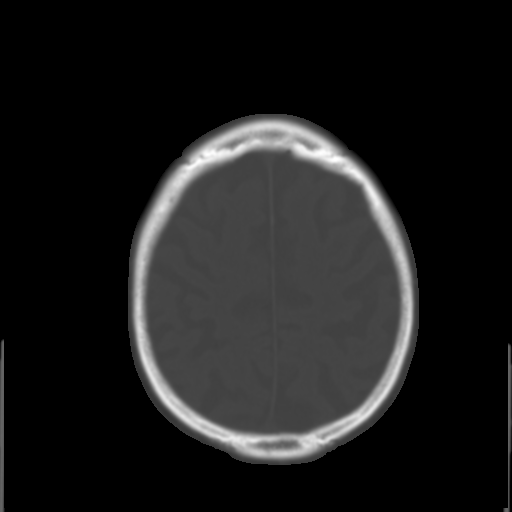
[im 26/32  brain]
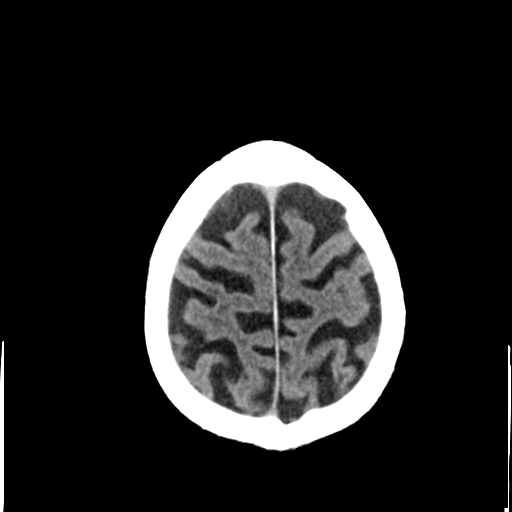
[im 28/32  brain]
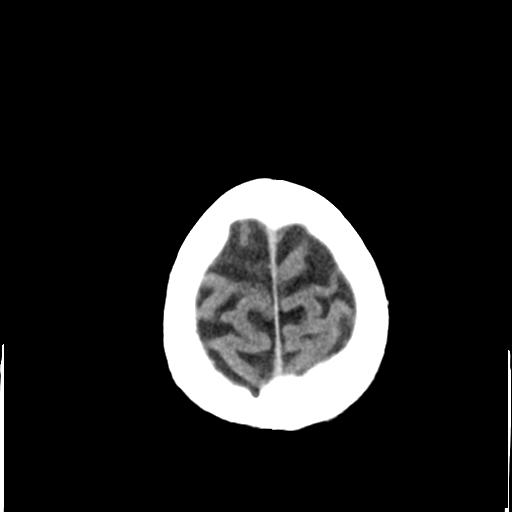
[im 30/32  brain]
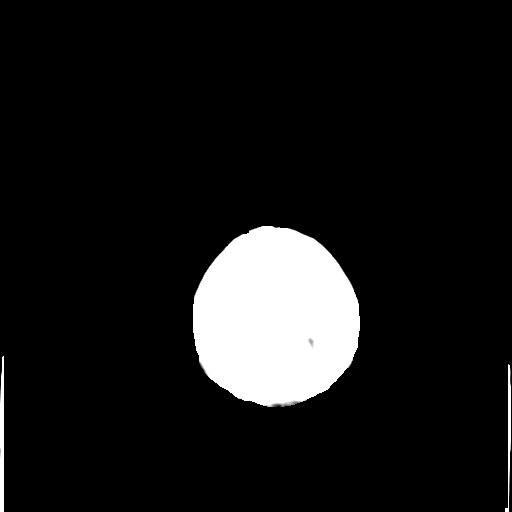

[16 of 30 positions shown; findings below may reference images not displayed]

FINDINGS: Advanced generalized atrophy is again noted. The ventricles are
proportionate to the degree of atrophy. Moderate cerebellar atrophy
is present as well.

No acute cortical infarct, hemorrhage, or mass lesion is present.
There is no significant extra-axial fluid collection.

The paranasal sinuses and mastoid air cells are clear. The calvarium
is intact. No significant extracranial lesion is present. An
occipital lipoma is stable.
IMPRESSION: 1. Stable moderate diffuse generalized atrophy.
2. No acute intracranial abnormality.

## 2017-06-08 IMAGING — CT CT ANGIO HEAD
1 of 10 series · 1 of 33 positions shown · IV contrast (omnipaque)
Comparison: CT earlier same day.

CLINICAL DATA: Gait disturbance. Slurred speech. Seizures. Atrial
fibrillation. Symptoms began today.

EXAM:
CT ANGIOGRAPHY HEAD AND NECK
TECHNIQUE: Multidetector CT imaging of the head and neck was performed using
the standard protocol during bolus administration of intravenous
contrast. Multiplanar CT image reconstructions and MIPs were
obtained to evaluate the vascular anatomy. Carotid stenosis
measurements (when applicable) are obtained utilizing NASCET
criteria, using the distal internal carotid diameter as the
denominator.
CONTRAST:  50mL OMNIPAQUE IOHEXOL 350 MG/ML SOLN

[Series 200: locator · axial · 0.61mm/px · 1 of 1 slices shown]
[im 1/1  soft-tissue]
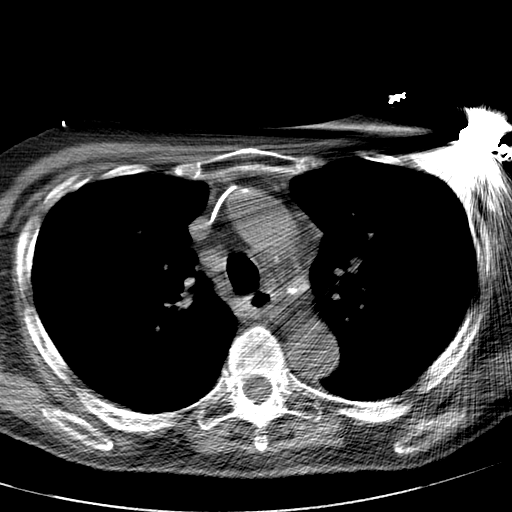

[1 of 33 positions shown; findings below may reference images not displayed]

FINDINGS: CT HEAD

Brain shows generalized atrophy with mild chronic small-vessel
changes of the white matter. No sign of acute infarction, mass
lesion, hemorrhage, hydrocephalus or extra-axial collection.

CTA NECK

Aortic arch: Mild atherosclerosis of the arch without aneurysm or
dissection. Branching pattern of the brachiocephalic vessels from
the arch is normal without origin stenosis.

Right carotid system: Common carotid artery widely patent to the
bifurcation. There is advanced atherosclerotic disease affecting the
proximal internal carotid artery. Minimal diameter in the bulb is
1.5 mm. Compared to a more distal cervical ICA diameter of 5 mm,
this indicates 70% stenosis.

Left carotid system: Common carotid artery widely patent to the
bifurcation region. Complex atherosclerotic disease of the ICA bulb
with minimal diameter of 1 mm. Compared to a more distal cervical
ICA diameter of 5 mm, this indicates an 80% stenosis.

Vertebral arteries:The left vertebral artery is dominant. There is
20% stenosis at the origin. The vessel is widely patent beyond that.
The nondominant right vertebral artery is widely patent at its
origin and through the cervical region.

Skeleton: Ordinary spondylosis

Other neck: No significant soft tissue lesion no upper chest lesion.

CTA HEAD

Anterior circulation: Internal carotid arteries are widely patent
through the skullbase. There is atherosclerotic calcification in the
siphon regions but no stenosis greater than 30-40%. Supra clinoid
internal carotid arteries are widely patent. The anterior and middle
cerebral vessels are patent without proximal stenosis, aneurysm or
vascular malformation. No missing branches are appreciated.

Posterior circulation: Both vertebral arteries are widely patent
through the foramen magnum to the basilar. No basilar stenosis.
Posterior circulation branch vessels are patent.

Venous sinuses: Patent and normal

Anatomic variants: None significant
IMPRESSION: No CT or CTA evidence of acute intracranial infarction.

70% stenosis of the proximal right internal carotid artery.

80% stenosis of the proximal left internal carotid artery.

Atherosclerotic disease in the carotid siphon regions with maximal
stenosis estimated at 30-40% on both sides.

## 2018-06-09 ENCOUNTER — Other Ambulatory Visit: Payer: Self-pay | Admitting: *Deleted

## 2018-06-09 NOTE — Patient Outreach (Signed)
[facility]) Care Management  06/09/2018  Oscar Santiago [birth year] [id number]  Late entry: [facility] case closure/ documentation only   Patient designated not active with [facility];  [facility] previous cases re-closed today as instructed  Caryl Pina, RN, BSN, CCRN [employer]umn[employer] [facility] Coordinator [facility] Care Management  [phone]
